# Patient Record
Sex: Female | Born: 1940
Health system: Southern US, Community
[De-identification: ages and names within clinical notes are randomized; demographics above are authoritative.]

## PROBLEM LIST (undated history)

## (undated) DIAGNOSIS — I779 Disorder of arteries and arterioles, unspecified: Secondary | ICD-10-CM

## (undated) DIAGNOSIS — D1803 Hemangioma of intra-abdominal structures: Secondary | ICD-10-CM

## (undated) DIAGNOSIS — D510 Vitamin B12 deficiency anemia due to intrinsic factor deficiency: Secondary | ICD-10-CM

## (undated) DIAGNOSIS — I10 Essential (primary) hypertension: Secondary | ICD-10-CM

## (undated) DIAGNOSIS — K625 Hemorrhage of anus and rectum: Secondary | ICD-10-CM

## (undated) DIAGNOSIS — K5790 Diverticulosis of intestine, part unspecified, without perforation or abscess without bleeding: Secondary | ICD-10-CM

## (undated) DIAGNOSIS — K635 Polyp of colon: Secondary | ICD-10-CM

## (undated) HISTORY — PX: DENTAL SURGERY: SHX609

## (undated) HISTORY — DX: Vitamin B12 deficiency anemia due to intrinsic factor deficiency: D51.0

## (undated) HISTORY — DX: Diverticulosis of intestine, part unspecified, without perforation or abscess without bleeding: K57.90

## (undated) HISTORY — DX: Hemorrhage of anus and rectum: K62.5

## (undated) HISTORY — DX: Polyp of colon: K63.5

## (undated) HISTORY — DX: Hemangioma of intra-abdominal structures: D18.03

## (undated) HISTORY — DX: Essential (primary) hypertension: I10

## (undated) HISTORY — PX: KNEE SURGERY: SHX244

## (undated) HISTORY — PX: HERNIA REPAIR: SHX51

## (undated) HISTORY — PX: APPENDECTOMY: SHX54

---

## 1999-05-29 ENCOUNTER — Encounter: Payer: Self-pay | Admitting: Emergency Medicine

## 1999-05-29 ENCOUNTER — Emergency Department (HOSPITAL_COMMUNITY): Admission: EM | Admit: 1999-05-29 | Discharge: 1999-05-29 | Payer: Self-pay | Admitting: Emergency Medicine

## 2000-07-16 ENCOUNTER — Ambulatory Visit (HOSPITAL_BASED_OUTPATIENT_CLINIC_OR_DEPARTMENT_OTHER): Admission: RE | Admit: 2000-07-16 | Discharge: 2000-07-16 | Payer: Self-pay | Admitting: Orthopaedic Surgery

## 2004-04-05 ENCOUNTER — Encounter: Admission: RE | Admit: 2004-04-05 | Discharge: 2004-04-05 | Payer: Self-pay | Admitting: Orthopedic Surgery

## 2004-06-05 ENCOUNTER — Ambulatory Visit (HOSPITAL_BASED_OUTPATIENT_CLINIC_OR_DEPARTMENT_OTHER): Admission: RE | Admit: 2004-06-05 | Discharge: 2004-06-05 | Payer: Self-pay | Admitting: Orthopedic Surgery

## 2004-07-11 ENCOUNTER — Ambulatory Visit: Payer: Self-pay | Admitting: Internal Medicine

## 2004-07-18 ENCOUNTER — Ambulatory Visit: Payer: Self-pay | Admitting: Internal Medicine

## 2004-07-18 DIAGNOSIS — K635 Polyp of colon: Secondary | ICD-10-CM

## 2004-07-18 HISTORY — DX: Polyp of colon: K63.5

## 2004-08-20 ENCOUNTER — Encounter: Admission: RE | Admit: 2004-08-20 | Discharge: 2004-08-20 | Payer: Self-pay | Admitting: Orthopedic Surgery

## 2005-06-26 ENCOUNTER — Other Ambulatory Visit: Admission: RE | Admit: 2005-06-26 | Discharge: 2005-06-26 | Payer: Self-pay | Admitting: Family Medicine

## 2005-07-09 ENCOUNTER — Encounter: Admission: RE | Admit: 2005-07-09 | Discharge: 2005-10-07 | Payer: Self-pay | Admitting: Family Medicine

## 2006-07-04 ENCOUNTER — Other Ambulatory Visit: Admission: RE | Admit: 2006-07-04 | Discharge: 2006-07-04 | Payer: Self-pay | Admitting: Family Medicine

## 2006-10-28 ENCOUNTER — Encounter (INDEPENDENT_AMBULATORY_CARE_PROVIDER_SITE_OTHER): Payer: Self-pay | Admitting: *Deleted

## 2006-10-28 ENCOUNTER — Ambulatory Visit (HOSPITAL_COMMUNITY): Admission: RE | Admit: 2006-10-28 | Discharge: 2006-10-28 | Payer: Self-pay | Admitting: Obstetrics and Gynecology

## 2006-12-24 ENCOUNTER — Ambulatory Visit (HOSPITAL_COMMUNITY): Admission: RE | Admit: 2006-12-24 | Discharge: 2006-12-24 | Payer: Self-pay | Admitting: Surgery

## 2006-12-24 ENCOUNTER — Encounter (INDEPENDENT_AMBULATORY_CARE_PROVIDER_SITE_OTHER): Payer: Self-pay | Admitting: *Deleted

## 2006-12-24 HISTORY — PX: INGUINAL HERNIA REPAIR: SUR1180

## 2006-12-24 HISTORY — PX: UMBILICAL HERNIA REPAIR: SHX196

## 2008-06-22 DIAGNOSIS — Z860100 Personal history of colon polyps, unspecified: Secondary | ICD-10-CM | POA: Insufficient documentation

## 2008-06-22 DIAGNOSIS — K5730 Diverticulosis of large intestine without perforation or abscess without bleeding: Secondary | ICD-10-CM

## 2008-06-22 DIAGNOSIS — K921 Melena: Secondary | ICD-10-CM | POA: Insufficient documentation

## 2008-06-23 ENCOUNTER — Ambulatory Visit: Payer: Self-pay | Admitting: Internal Medicine

## 2008-06-23 DIAGNOSIS — K8020 Calculus of gallbladder without cholecystitis without obstruction: Secondary | ICD-10-CM | POA: Insufficient documentation

## 2008-06-24 ENCOUNTER — Ambulatory Visit: Payer: Self-pay | Admitting: Internal Medicine

## 2008-06-30 ENCOUNTER — Ambulatory Visit: Payer: Self-pay | Admitting: Internal Medicine

## 2008-06-30 LAB — CONVERTED CEMR LAB
Fecal Occult Blood: NEGATIVE
OCCULT 5: NEGATIVE

## 2008-07-03 ENCOUNTER — Encounter: Payer: Self-pay | Admitting: Internal Medicine

## 2008-09-29 ENCOUNTER — Emergency Department (HOSPITAL_COMMUNITY): Admission: EM | Admit: 2008-09-29 | Discharge: 2008-09-29 | Payer: Self-pay | Admitting: Emergency Medicine

## 2008-12-27 ENCOUNTER — Encounter: Admission: RE | Admit: 2008-12-27 | Discharge: 2008-12-27 | Payer: Self-pay | Admitting: Orthopedic Surgery

## 2009-07-13 ENCOUNTER — Encounter (INDEPENDENT_AMBULATORY_CARE_PROVIDER_SITE_OTHER): Payer: Self-pay | Admitting: *Deleted

## 2010-05-21 HISTORY — PX: APPENDECTOMY: SHX54

## 2010-06-11 ENCOUNTER — Encounter: Payer: Self-pay | Admitting: Surgery

## 2010-06-22 NOTE — Letter (Signed)
Summary: Colonoscopy Date Change Letter  [facility]  [address] [place], [place] [address]   Phone: [phone]  Fax: [phone]      July 13, 2009 MRN: [id number]   [facility] [address] [place], [place]  [address]   Dear Ms. Vantine,   Previously you were recommended to have a repeat colonoscopy around this time. Your chart was recently reviewed by Dr. Hedwig Morton. Juanda Chance of [facility]. Follow up colonoscopy is now recommended in February 2013. This revised recommendation is based on current, nationally recognized guidelines for colorectal cancer screening and polyp surveillance. These guidelines are endorsed by the American Cancer Society, [employer] on Colorectal Cancer as well as numerous other major medical organizations.  Please understand that our recommendation assumes that you do not have any new symptoms such as bleeding, a change in bowel habits, anemia, or significant abdominal discomfort. If you do have any concerning GI symptoms or want to discuss the guideline recommendations, please call to arrange an office visit at your earliest convenience. Otherwise we will keep you in our reminder system and contact you 1-2 months prior to the date listed above to schedule your next colonoscopy.  Thank you,  Hedwig Morton. Juanda Chance, M.D.  [facility]: [facility] [phone]

## 2010-10-03 NOTE — Op Note (Signed)
Tina Hardy, Tina Hardy            ACCOUNT NO.:  [id number]   MEDICAL RECORD NO.:  [id number]          PATIENT TYPE:  AMB   LOCATION:  DAY                          FACILITY:  [facility]   PHYSICIAN:  Ardeth Sportsman, MD     DATE OF BIRTH:  04-09-1941   DATE OF PROCEDURE:  12/24/2006  DATE OF DISCHARGE:                               OPERATIVE REPORT   PRIMARY CARE PHYSICIAN:  Zenaida Niece, M.D.   SURGEON:  Ardeth Sportsman, MD   ASSISTANT:  None.   PREOPERATIVE DIAGNOSES:  1. Left lower quadrant abdominal hernia incarcerated with sigmoid      colon.  2. Umbilical hernia.   POSTOPERATIVE DIAGNOSES:  1. Left inguinal indirect hernia incarcerated with sigmoid colon.  2. Umbilical hernia.  3. Diastases recti, mild & infraumbilical.   PROCEDURE:  1. Diagnostic laparoscopy with reduction of the sigmoid colon of      hernia.  2. Laparoscopic preperitoneal left inguinal hernia repair with mesh      (Parietex 15 x 15 cm).  3. Primary umbilical hernia repair.   ANESTHESIA:  1. General anesthesia.  2. Local anesthetic in with a field block around all port sites as      well as left ilioinguinal and genitofemoral nerve blocks.   SPECIMENS:  None.   DRAINS:  None.   ESTIMATED BLOOD LOSS:  Less than 10 mL.   COMPLICATIONS:  None apparent.   INDICATIONS:  Ms. Rufo is a 70 year old woman who has had a left  lower quadrant abdominal pain and swelling with CT scan concerning of an  abdominal hernia lateral to rectus muscles in the left lower quadrant.  She also a small umbilical hernia.  The anatomy and physiology of  abdominal wall formation and pathophysiology of herniation with risks,  incarceration and strangulation and debilitating pain and function was  discussed.  Options passed and recommendations made for diagnostic  laparoscopy with reduction of hernias and laparoscopic repair of  hernias.   Risks such as stroke, heart attack, deep venous thrombosis, pulmonary  embolism and death were discussed.  Risks such as bleeding, need for  transfusion, wound infection, abscess, injury to other organs, hernia  recurrence, prolonged pain, urinary retention, other diagnoses other  risks were discussed.  Questions answered.  She agreed to proceed.   OPERATIVE FINDINGS:  She actually had indirect left inguinal hernia with  sigmoid colon incarcerated within it.  It was tracking cephalad.  Hernia  sac seemed to be tracking cephalad.  There was no obvious direct defect.  She had a small umbilical defect.  She had mild diastasis recti and in  her infraumbilical midline region.  Did not seem to prominent  supraumbilically.   DESCRIPTION OF PROCEDURE:  Informed consent was confirmed.  The patient  received IV clindamycin and gentamicin given her penicillin allergy.  She was positioned supine, both arms tucked.  She underwent general  anesthesia without difficulty.  She had sequential compression devices  active during entire case.  She has Foley catheter sterilely placed.  Her abdomen was prepped, draped in sterile fashion.   Entry was gained  to the abdomen.  The patient in steep reverse  Trendelenburg and right-side up using optical entry with a 5 mm/0 degree  scope.  Capnoperitoneum 15 mmHg provided good abdominal insufflation.  The direct visualization a 10 mm port was placed in the right flank and  a 5 mm port was placed in the right lower quadrant.  Diagnostic  laparoscopy revealed a very small umbilical hernia.  There was obvious  sigmoid colon was obviously incarcerated up into the defect in the left  lower quadrant.  This sigmoid colon was able to be freed out with  primary blunt as well as some controlled sharp dissection.  No cautery  was used.  Once this was reduced further adequately seen became more  apparent with the round ligament going into this structure that this was  in fact a large indirect inguinal hernia with hernia sac distracting  cephalad  instead of down into the groin.  The sigmoid colon appeared  viable with no other abnormalities.  Given this location, I felt I could  do a better repair through a preperitoneal approach.  Therefore  capnoperitoneum was evacuated.   An infraumbilical curvilinear incision was made and nick was made in the  rectus fascia just the right of midline.  A 10 mm port was passed behind  posterior to the right rectus abdominis muscle, was capnopreperitoneum  was induced 15 mmHg.  The right lower quadrant abdominal camera was used  help free the peritoneum off the anterior abdominal wall.  Enough  working space was created such that the right lower quadrant 5 port  could be directed into the preperitoneal space.  A 5 mm port was placed  in the left upper quadrant directed into the preperitoneal space.   Dissection was done to free the peritoneum off the anterior abdominal  wall laterally.  I could see the peritoneum crawling up into the defect  and the hernia sac was able to be completely reduced.  Defect was just  lateral to the inferior epigastric consistent with an indirect hernia.  The round ligament was skeletonized and transected and reduced.  Further  dissection was done to help free the peritoneum off posteriorly and  proximally off the psoas as possible.  The iliac vessels and ureter  could be seen and were preserved at all times.  No cautery was used  around that region.  The anteromedial bladder was freed off the  anteromedial pelvic wall near level of the obturator foramen.  Hemostasis was excellent.  Camera inspection confirmed a large indirect  defect.  There were no other defects noted.  There was no peritoneal  tears of significance.  There was no peritoneal tears.  A 15 x 15 cm  Parietex mesh was cut to a half - skull shape and placed into the  preperitoneal space such that a medial and inferior flap rested in the  true pelvis overlying the obturator foramen.  Mesh laid well  inferiorly  and proximally as well as laterally superiorly, medially such that was  at least 3 inches circumferential converge around the indirect defect.  Peritoneum was allowed to fold back over the mesh.  Lead points of the  hernia sac were grasped, elevated cephalad as capnopreperitoneum was  evacuated.   5 mm right lower quadrant port was redirected into the peritoneal space  and camera inspection revealed good circumferential coverage from a  intraperitoneal perspective.  I again the mild diastasis recti could be  seen but it was rather  mild and that no evidence of incarceration,  strangulation or any other abnormalities.  The fascial defect in the  right flank 10 mm was approximated using a #1 Novofil stitch using  laparoscopic suture passer under direct visualization.  Camera  inspection revealed except for the small umbilical hernia, no other  obvious abdominal wall defects.  Capnoperitoneum was evacuated and all  the rest of the ports were removed.  The fascial defect infraumbilically  was small enough to be easily closed using a figure-of-eight zero Vicryl  stitch to good result pretty much incorporating around the level of the  fascial defect made for the initial port placement.  Skin was closed  using 4-0 Monocryl stitch.  Sterile dressings applied.  The patient was  extubated and sent to recovery in stable condition.   I explained the operative findings to the patient's family.  Postop  instructions were discussed in detail.  Questions answered.  They  expressed understanding and appreciation.      Ardeth Sportsman, MD  Electronically Signed     SCG/MEDQ  D:  12/24/2006  T:  12/24/2006  Job:  [id number]   cc:   Zenaida Niece, M.D.  Fax: [phone]

## 2010-10-06 NOTE — Op Note (Signed)
[place]  Patient:    Docia Furl                       MRN: [id number] Proc. Date: 07/16/00 Adm. Date:  [id number] Attending:  Marcene Corning                           Operative Report  PREOPERATIVE DIAGNOSIS:  Left knee torn lateral meniscus.  POSTOPERATIVE DIAGNOSES: 1. Left knee torn lateral meniscus. 2. Left knee chondromalacia of the patella.  PROCEDURES: 1. Left partial lateral meniscectomy. 2. Left knee chondroplasty, intertrochlear groove.  ANESTHESIA:  Knee block.  SURGEON:  Lubertha Basque. Jerl Santos, M.D.  INDICATION FOR PROCEDURE:  The patient is a 70 year old woman more than a year out from knee injuries.  She had a nondisplaced tibial plateau fracture and this went on to heal, but unfortunately she has persisted with lateral aspect ankle pain.  This persists despite oral anti-inflammatories and an injection, which did help her in a transient way.  At this point, she is offered operative intervention that consists of an arthroscopy.  The procedure was discussed with the patient, and informed operative consent was obtained after discussing the possible complications of, reaction to anesthesia, and infection.  DESCRIPTION OF PROCEDURE:  The patient was taken to the operating suite, where knee block anesthetic was applied without difficulty.  She was positioned supine and prepped and draped in the normal sterile fashion.  After the initiation of preoperative IV antibiotics, an arthroscopy of the left knee was performed through two inferior portals.  The suprapatellar pouch was benign, while the patellofemoral joint exhibited some grade 2 and grade 3 chondromalacia in the groove.  This was addressed with a thorough chondroplasty.  The patella tracked well, and no lateral release was required. The medial compartment was completely benign, and the ACL was intact in the notch.  In the lateral compartment, she did have a small tear of  the lateral meniscus, which was addressed with a partial lateral meniscectomy, taking a tiny portion of the structure back to the stable rim.  On the far posterior aspect of the tibial plateau, an old injury was seen, which probably represents her old fracture, with some scar tissue having formed to smooth out the tibial plateau in an even fashion.  The knee was thoroughly irrigated at the end of the case, followed by placement of Marcaine with epinephrine and morphine.  Adaptic was placed over her portals, followed by dry gauze and a loose Ace wrap.  Estimated blood loss and intraoperative fluids can be obtained from the anesthesia records.  DISPOSITION:  The patient was taken to recovery in stable condition.  Plans were for her to go home the same day and to follow up in the office in less than a week.  I will contact her by phone tonight. DD:  07/16/00 TD:  07/16/00 Job: [id number]

## 2010-10-06 NOTE — Op Note (Signed)
NAMEWINNI, EHRHARD            ACCOUNT NO.:  [id number]   MEDICAL RECORD NO.:  [id number]          PATIENT TYPE:  AMB   LOCATION:  [facility]C                         FACILITY:  [facility]   PHYSICIAN:  Marlowe Kays, M.D.  DATE OF BIRTH:  01/18/1941   DATE OF PROCEDURE:  06/05/2004  DATE OF DISCHARGE:                                 OPERATIVE REPORT   PREOPERATIVE DIAGNOSIS:  Complex tear of lateral meniscus, right knee.   POSTOPERATIVE DIAGNOSIS:  Complex tear of lateral meniscus, right knee.   OPERATION:  Right knee arthroscopy with partial lateral meniscectomy.   SURGEON:  Marlowe Kays, M.D.   ASSISTANT:  Nurse.   ANESTHESIA:  General.   PATHOLOGY AND JUSTIFICATION FOR PROCEDURE:  Pain and swelling, right knee,  with MRI demonstrating complex tear of the anterior and midbody.  This is  confirmed at surgery.  Other than some minimal arthritic changes, the rest  of the knee looked unremarkable.   PROCEDURE:  Satisfactory general anesthesia.  Pneumatic tourniquet, thigh  stabilizer.  The knee was esmarched out nonsterilely and prepped from  stabilizer to ankle with DuraPrep, draped in a sterile field.  The left leg  was wrapped with Ace wrap and knee support beneath it.  Superior medial  saline inflow.  First through an anterolateral portal, the medial  compartment of the knee joint was evaluated.  As noted, the medial  compartment of the knee was unremarkable.  Then looked up in the medial  gutter and suprapatellar area with no abnormalities noted.  I reversed  portals.  On first glance, there was some fraying of the inner aspect of the  lateral meniscus, which seemed at odds with the MRI.  However, on further  inspection there was a good bit of reactive tissue in the area of the mid-  body of the meniscus and the synovial surface and after cleaning this up  with the 3.5 shaver and probing, I found a significant  tear.  This was  almost a bucket handle-type tear of this area.   I handled this by using  arthroscopic scissors to cut it anteriorly and then working posteriorly and  smoothing off the large bucket handle fragment at the posterior horn.  Remnants of this junction were then trimmed up with small baskets.  Final  meniscus was basically nonexistent until the posterior third, which was  stable.  The knee joint was then irrigated until clear and all fluid  possible removed.  Marcaine 0.5% with adrenalin 20 mL was then instilled  through the inflow apparatus, which was removed from this portal and closed  with 4-0 nylon as well.  Betadine and Adaptic dry sterile dressing were  applied, the tourniquet was released.  At the time of this dictation, she  was on her way to the recovery room in satisfactory condition with no known  complications.     JA/MEDQ  D:  06/05/2004  T:  06/05/2004  Job:  [id number]

## 2010-11-14 ENCOUNTER — Other Ambulatory Visit (INDEPENDENT_AMBULATORY_CARE_PROVIDER_SITE_OTHER): Payer: Self-pay | Admitting: General Surgery

## 2010-11-14 ENCOUNTER — Emergency Department (HOSPITAL_COMMUNITY): Payer: Medicare Other

## 2010-11-14 ENCOUNTER — Inpatient Hospital Stay (HOSPITAL_COMMUNITY)
Admission: EM | Admit: 2010-11-14 | Discharge: 2010-11-15 | DRG: 343 | Disposition: A | Discharge status: Home / Self Care | Payer: Medicare Other | Attending: General Surgery | Admitting: General Surgery

## 2010-11-14 DIAGNOSIS — K3580 Unspecified acute appendicitis: Secondary | ICD-10-CM

## 2010-11-14 DIAGNOSIS — R1031 Right lower quadrant pain: Secondary | ICD-10-CM

## 2010-11-14 LAB — DIFFERENTIAL
Basophils Absolute: 0 10*3/uL (ref 0.0–0.1)
Lymphocytes Relative: 17 % (ref 12–46)
Lymphs Abs: 2.4 10*3/uL (ref 0.7–4.0)
Neutro Abs: 10.4 10*3/uL — ABNORMAL HIGH (ref 1.7–7.7)
Neutrophils Relative %: 74 % (ref 43–77)

## 2010-11-14 LAB — BASIC METABOLIC PANEL
Calcium: 9.4 mg/dL (ref 8.4–10.5)
GFR calc non Af Amer: >60 mL/min (ref >60)
Glucose, Bld: 78 mg/dL (ref 70–99)
Sodium: 136 mEq/L (ref 135–145)

## 2010-11-14 LAB — CBC
MCH: 30.4 pg (ref 26.0–34.0)
MCHC: 32.4 g/dL (ref 30.0–36.0)
Platelets: 352 10*3/uL (ref 150–400)
RBC: 4.48 MIL/uL (ref 3.87–5.11)
WBC: 14.1 10*3/uL — ABNORMAL HIGH (ref 4.0–10.5)

## 2010-11-20 NOTE — Op Note (Signed)
Tina Hardy, Tina Hardy            ACCOUNT NO.:  [id number]  MEDICAL RECORD NO.:  [id number]  LOCATION:  0001                         FACILITY:  [facility]  PHYSICIAN:  Adolph Pollack, M.D.DATE OF BIRTH:  03-03-1941  DATE OF PROCEDURE:  11/14/2010 DATE OF DISCHARGE:                              OPERATIVE REPORT   PREOPERATIVE DIAGNOSIS:  Acute appendicitis.  POSTOPERATIVE DIAGNOSIS:  Acute retrocecal appendicitis.  PROCEDURE:  Laparoscopic appendectomy.  SURGEON:  Adolph Pollack, MD  ANESTHESIA:  General.  INDICATION:  This is a 70 year old female who began feeling weak and having abdominal pain throughout the night.  She presented to Dr. Lanell Matar office and was sent for a CT scan which was consistent with acute appendicitis.  She now presents for laparoscopic appendectomy.  We discussed the procedure risks and aftercare preoperatively.  TECHNIQUE:  She was brought to the operating room, placed supine on the operating table and general anesthetic was administered.  The hair in the lower abdominal area was clipped and a Foley was inserted.  The abdominal wall was sterilely prepped and draped.  Just above the umbilicus, Marcaine solution was infiltrated and a small longitudinal incision was made through the skin, subcutaneous tissue, fascia, and peritoneum entering the peritoneal cavity under direct vision.  A pursestring suture of 0 Vicryl was placed around the fascial edges.  A Hasson trocar was introduced into the peritoneal cavity and pneumoperitoneum created by insufflation of CO2 gas.  Next, a laparoscope was introduced and there was no underlying bleeding or organ injury.  There was no purulent drainage or evidence of perforation.  A 5 mm trocar was placed in left lower quadrant.  The cecum was rotated medially and an acutely inflamed retrocecal appendicitis was noted.  A 5 mm trocar was then placed in the right upper quadrant.  I then dissected the retrocecal  appendix free from its attachments to the antimesenteric fat pad out of the ileum as well as some of the mesocolon.  I then divided the mesoappendix down to its base using the Harmonic scalpel.  The appendix was then retracted anteriorly. Using the Endo-GIA stapler, I amputated the appendix off the cecum with a small cuff of cecum.  This was placed in Endopouch bag and removed through the supraumbilical port.  Trocar was then replaced.  I then copiously irrigated out the staple line area in the right lower quadrant.  There was some bleeding from the lateral staple line which was controlled with hemoclips.  I then evacuated irrigation fluid as much as possible and re-inspected the mesenteric dissection as well as right lower quadrant, there was no evidence of bleeding and the staple line appeared solid.  I then removed the supraumbilical trocar and closed the fascial defect under laparoscopic vision by tightening up and tying down the pursestring suture.  The remaining trocars were removed and pneumoperitoneum was released.  The skin incisions were closed with 4-0 Monocryl subcuticular stitches.  Steri-Strips and sterile dressings were applied.  She tolerated procedure without any apparent complications and was taken to recovery room in satisfactory condition.     Adolph Pollack, M.D.     Kari Baars  D:  11/14/2010  T:  11/15/2010  Job:  [id number]  cc:   Geoffry Paradise, M.D. Fax: [phone]  Electronically Signed by Avel Peace M.D. on 11/20/2010 08:31:04 AM

## 2010-12-05 ENCOUNTER — Ambulatory Visit (INDEPENDENT_AMBULATORY_CARE_PROVIDER_SITE_OTHER): Payer: Medicare Other | Admitting: General Surgery

## 2010-12-05 ENCOUNTER — Encounter (INDEPENDENT_AMBULATORY_CARE_PROVIDER_SITE_OTHER): Payer: Self-pay | Admitting: General Surgery

## 2010-12-05 DIAGNOSIS — K3580 Unspecified acute appendicitis: Secondary | ICD-10-CM

## 2010-12-05 NOTE — Progress Notes (Signed)
History of Present Illness: Tina Hardy is a  70 y.o. female who presents today status post lap appy.  Pathology reveals acute suppurative appendicitis.  The patient is tolerating a regular diet, having normal bowel movements, has good pain control.  She  is back to most normal activities. She thinks she may have a kidney infection.  She had some discomfort while taking antibiotics, which resolved and again after drinking tea last Sunday.  Pt. Reports no prior hx. Of UTI's.  Her Primary care is Dr. Tawni Levy so we will defer to him, on possible UTI.   Physical Exam: Abd: soft, nontender, active bowel sounds, nondistended.  All incisions are well healed.  Impression: 1.  Acute appendicitis, s/p lap appy  Plan: She  is able to return to normal activities. She  may follow up on a prn basis.

## 2010-12-05 NOTE — Patient Instructions (Signed)
Call Dr. Jacky Kindle to follow up on possible urinary or kidney infection.

## 2011-03-05 LAB — HEMOGLOBIN AND HEMATOCRIT, BLOOD: HCT: 38.8

## 2011-03-08 LAB — CREATININE, SERUM: GFR calc non Af Amer: >60

## 2011-07-13 ENCOUNTER — Encounter: Payer: Self-pay | Admitting: Internal Medicine

## 2012-03-05 ENCOUNTER — Other Ambulatory Visit: Payer: Self-pay | Admitting: Chiropractor

## 2012-03-05 ENCOUNTER — Ambulatory Visit
Admission: RE | Admit: 2012-03-05 | Discharge: 2012-03-05 | Disposition: A | Discharge status: Home / Self Care | Payer: Medicare Other | Source: Ambulatory Visit | Attending: Chiropractor | Admitting: Chiropractor

## 2012-03-05 DIAGNOSIS — M545 Low back pain: Secondary | ICD-10-CM

## 2012-04-15 ENCOUNTER — Telehealth: Payer: Self-pay | Admitting: Internal Medicine

## 2012-04-15 ENCOUNTER — Encounter: Payer: Self-pay | Admitting: *Deleted

## 2012-04-15 NOTE — Telephone Encounter (Signed)
Scheduled with Dr. Juanda Chance on 04/16/12 at 11:30 AM. Malachi Bonds to fax records.

## 2012-04-16 ENCOUNTER — Ambulatory Visit (INDEPENDENT_AMBULATORY_CARE_PROVIDER_SITE_OTHER): Payer: Medicare Other | Admitting: Internal Medicine

## 2012-04-16 ENCOUNTER — Encounter: Payer: Self-pay | Admitting: Internal Medicine

## 2012-04-16 ENCOUNTER — Other Ambulatory Visit (INDEPENDENT_AMBULATORY_CARE_PROVIDER_SITE_OTHER): Payer: Medicare Other

## 2012-04-16 VITALS — BP 122/80 | HR 88 | Ht 63.75 in | Wt 155.5 lb

## 2012-04-16 DIAGNOSIS — K8020 Calculus of gallbladder without cholecystitis without obstruction: Secondary | ICD-10-CM

## 2012-04-16 DIAGNOSIS — R1033 Periumbilical pain: Secondary | ICD-10-CM

## 2012-04-16 LAB — HEPATIC FUNCTION PANEL
AST: 24 U/L (ref 0–37)
Albumin: 4.3 g/dL (ref 3.5–5.2)
Alkaline Phosphatase: 82 U/L (ref 39–117)

## 2012-04-16 NOTE — Progress Notes (Signed)
Tina Hardy [birth year] MRN [id number]   History of Present Illness:  This is a 71 year old white female with periumbilical abdominal pain occurring almost daily, even at night. It is associated with meals. She denies any irregularity of bowel habits. Her last colonoscopy in 2010 did not show any diverticulosis. There is a strong family history of gallbladder disease in her sisters. In 2008, a CT scan of the abdomen showed gallbladder stones without evidence of cholecystitis. She has been working out several times a week to maintain her weight. She denies dysphagia, odynophagia or rectal bleeding.   Past Medical History  Diagnosis Date  . Liver hemangioma   . Gallstones   . Diverticulosis   . Colon polyp   . Pernicious anemia    Past Surgical History  Procedure Date  . Appendectomy   . Umbilical hernia repair     with reduction of sigmoid colon which was incarcerated  . Knee surgery     right  . Inguinal hernia repair     reports that she quit smoking about 39 years ago. She has never used smokeless tobacco. She reports that she drinks alcohol. She reports that she uses illicit drugs (Marijuana). family history includes Colon polyps in an unspecified family member and Crohn's disease in her others. Allergies  Allergen Reactions  . Codeine     REACTION: questionable  . Penicillins     REACTION: questionable        Review of Systems:Negative for heartburn dysphagia rectal bleeding  The remainder of the 10 point ROS is negative except as outlined in H&P   Physical Exam: General appearance  Well developed, in no distress. Eyes- non icteric. HEENT nontraumatic, normocephalic. Mouth no lesions, tongue papillated, no cheilosis. Neck supple without adenopathy, thyroid not enlarged, no carotid bruits, no JVD. Lungs Clear to auscultation bilaterally. Cor normal S1, normal S2, regular rhythm, no murmur,  quiet precordium. Abdomen: Soft with mild tenderness along right  costal margin. Liver edge at costal margin. No CVA tenderness. Lower abdomen unremarkable.  Rectal:Not done.  Extremities no pedal edema. Skin no lesions. Neurological alert and oriented x 3. Psychological normal mood and affect.  Assessment and Plan:  Problem #1 New onset of vague abdominal pain suggestive of biliary origin or pancreatitis.Marland Kitchen Physical exam confirms tenderness in the right upper quadrant. She has a strong family history of gallbladder disease and has known gallstones on prior imaging in 2008. She is up-to-date on her colonoscopy. We will proceed with an upper abdominal ultrasound and HIDA scan if necessary. We will check her amylase, lipase and liver function tests today. I instructed her a low-fat diet especially before the holidays to avoid exacerbation of gallbladder disease.   04/16/2012 [name]

## 2012-04-16 NOTE — Patient Instructions (Addendum)
Your physician has requested that you go to the basement for the following lab work before leaving today: Hepatic Panel, Amylase, Lipase  You have been scheduled for an abdominal ultrasound at [facility] (1st floor of hospital) on Tuesday, 04/22/12 at 8:30 am. Please arrive 15 minutes prior to your appointment for registration. Make certain not to have anything to eat or drink 6 hours prior to your appointment. Should you need to reschedule your appointment, please contact radiology at [phone]. This test typically takes about 30 minutes to perform.  CC: Dr Geoffry Paradise

## 2012-04-18 ENCOUNTER — Encounter: Payer: Self-pay | Admitting: Internal Medicine

## 2012-04-22 ENCOUNTER — Other Ambulatory Visit: Payer: Self-pay | Admitting: *Deleted

## 2012-04-22 ENCOUNTER — Ambulatory Visit (HOSPITAL_COMMUNITY)
Admission: RE | Admit: 2012-04-22 | Discharge: 2012-04-22 | Disposition: A | Discharge status: Home / Self Care | Payer: Medicare Other | Source: Ambulatory Visit | Attending: Internal Medicine | Admitting: Internal Medicine

## 2012-04-22 DIAGNOSIS — D1803 Hemangioma of intra-abdominal structures: Secondary | ICD-10-CM | POA: Insufficient documentation

## 2012-04-22 DIAGNOSIS — K8020 Calculus of gallbladder without cholecystitis without obstruction: Secondary | ICD-10-CM

## 2012-04-22 DIAGNOSIS — K7689 Other specified diseases of liver: Secondary | ICD-10-CM | POA: Insufficient documentation

## 2012-04-23 ENCOUNTER — Other Ambulatory Visit (INDEPENDENT_AMBULATORY_CARE_PROVIDER_SITE_OTHER): Payer: Medicare Other

## 2012-04-23 DIAGNOSIS — K8020 Calculus of gallbladder without cholecystitis without obstruction: Secondary | ICD-10-CM

## 2012-04-23 LAB — LIPASE: Lipase: 48 U/L (ref 11.0–59.0)

## 2012-05-01 ENCOUNTER — Encounter (INDEPENDENT_AMBULATORY_CARE_PROVIDER_SITE_OTHER): Payer: Self-pay | Admitting: General Surgery

## 2012-05-01 ENCOUNTER — Ambulatory Visit (INDEPENDENT_AMBULATORY_CARE_PROVIDER_SITE_OTHER): Payer: Medicare Other | Admitting: General Surgery

## 2012-05-01 VITALS — BP 132/78 | HR 74 | Temp 97.8°F | Resp 16 | Ht 65.0 in | Wt 155.5 lb

## 2012-05-01 DIAGNOSIS — K8020 Calculus of gallbladder without cholecystitis without obstruction: Secondary | ICD-10-CM | POA: Insufficient documentation

## 2012-05-01 NOTE — Patient Instructions (Signed)
Laparoscopic Cholecystectomy Laparoscopic cholecystectomy is surgery to remove the gallbladder. The gallbladder is located slightly to the right of center in the abdomen, behind the liver. It is a concentrating and storage sac for the bile produced in the liver. Bile aids in the digestion and absorption of fats. Gallbladder disease (cholecystitis) is an inflammation of your gallbladder. This condition is usually caused by a buildup of gallstones (cholelithiasis) in your gallbladder. Gallstones can block the flow of bile, resulting in inflammation and pain. In severe cases, emergency surgery may be required. When emergency surgery is not required, you will have time to prepare for the procedure. Laparoscopic surgery is an alternative to open surgery. Laparoscopic surgery usually has a shorter recovery time. Your common bile duct may also need to be examined and explored. Your caregiver will discuss this with you if he or she feels this should be done. If stones are found in the common bile duct, they may be removed. LET YOUR CAREGIVER KNOW ABOUT:  Allergies to food or medicine.  Medicines taken, including vitamins, herbs, eyedrops, over-the-counter medicines, and creams.  Use of steroids (by mouth or creams).  Previous problems with anesthetics or numbing medicines.  History of bleeding problems or blood clots.  Previous surgery.  Other health problems, including diabetes and kidney problems.  Possibility of pregnancy, if this applies. RISKS AND COMPLICATIONS All surgery is associated with risks. Some problems that may occur following this procedure include:  Infection.  Damage to the common bile duct, nerves, arteries, veins, or other internal organs such as the stomach or intestines.  Bleeding.  A stone may remain in the common bile duct. BEFORE THE PROCEDURE  Do not take aspirin for 3 days prior to surgery or blood thinners for 1 week prior to surgery.  Do not eat or drink  anything after midnight the night before surgery.  Let your caregiver know if you develop a cold or other infectious problem prior to surgery.  You should be present 60 minutes before the procedure or as directed. PROCEDURE  You will be given medicine that makes you sleep (general anesthetic). When you are asleep, your surgeon will make several small cuts (incisions) in your abdomen. One of these incisions is used to insert a small, lighted scope (laparoscope) into the abdomen. The laparoscope helps the surgeon see into your abdomen. Carbon dioxide gas will be pumped into your abdomen. The gas allows more room for the surgeon to perform your surgery. Other operating instruments are inserted through the other incisions. Laparoscopic procedures may not be appropriate when:  There is major scarring from previous surgery.  The gallbladder is extremely inflamed.  There are bleeding disorders or unexpected cirrhosis of the liver.  A pregnancy is near term.  Other conditions make the laparoscopic procedure impossible. If your surgeon feels it is not safe to continue with a laparoscopic procedure, he or she will perform an open abdominal procedure. In this case, the surgeon will make an incision to open the abdomen. This gives the surgeon a larger view and field to work within. This may allow the surgeon to perform procedures that sometimes cannot be performed with a laparoscope alone. Open surgery has a longer recovery time. AFTER THE PROCEDURE  You will be taken to the recovery area where a nurse will watch and check your progress.  You may be allowed to go home the same day.  Do not resume physical activities until directed by your caregiver.  You may resume a normal diet and   activities as directed. Document Released: 05/07/2005 Document Revised: 07/30/2011 Document Reviewed: 10/20/2010 [employer] Patient Information 2013 [employer]. Fat and Cholesterol Control Diet Cholesterol levels in  your body are determined significantly by your diet. Cholesterol levels may also be related to heart disease. The following material helps to explain this relationship and discusses what you can do to help keep your heart healthy. Not all cholesterol is bad. Low-density lipoprotein (LDL) cholesterol is the "bad" cholesterol. It may cause fatty deposits to build up inside your arteries. High-density lipoprotein (HDL) cholesterol is "good." It helps to remove the "bad" LDL cholesterol from your blood. Cholesterol is a very important risk factor for heart disease. Other risk factors are high blood pressure, smoking, stress, heredity, and weight. The heart muscle gets its supply of blood through the coronary arteries. If your LDL cholesterol is high and your HDL cholesterol is low, you are at risk for having fatty deposits build up in your coronary arteries. This leaves less room through which blood can flow. Without sufficient blood and oxygen, the heart muscle cannot function properly and you may feel chest pains (angina pectoris). When a coronary artery closes up entirely, a part of the heart muscle may die causing a heart attack (myocardial infarction). CHECKING CHOLESTEROL When your caregiver sends your blood to a lab to be examined for cholesterol, a complete lipid (fat) profile may be done. With this test, the total amount of cholesterol and levels of LDL and HDL are determined. Triglycerides are a type of fat that circulates in the blood. They can also be used to determine heart disease risk. The list below describes what the numbers should be: Test: Total Cholesterol.  Less than 200 mg/dl. Test: LDL "bad cholesterol."  Less than 100 mg/dl.  Less than 70 mg/dl if you are at very high risk of a heart attack or sudden cardiac death. Test: HDL "good cholesterol."  Greater than 50 mg/dl for women.  Greater than 40 mg/dl for men. Test: Triglycerides.  Less than 150 mg/dl. CONTROLLING CHOLESTEROL  WITH DIET Although exercise and lifestyle factors are important, your diet is key. That is because certain foods are known to raise cholesterol and others to lower it. The goal is to balance foods for their effect on cholesterol and more importantly, to replace saturated and trans fat with other types of fat, such as monounsaturated fat, polyunsaturated fat, and omega-3 fatty acids. On average, a person should consume no more than 15 to 17 g of saturated fat daily. Saturated and trans fats are considered "bad" fats, and they will raise LDL cholesterol. Saturated fats are primarily found in animal products such as meats, butter, and cream. However, that does not mean you need to give up all your favorite foods. Today, there are good tasting, low-fat, low-cholesterol substitutes for most of the things you like to eat. Choose low-fat or nonfat alternatives. Choose round or loin cuts of red meat. These types of cuts are lowest in fat and cholesterol. Chicken (without the skin), fish, veal, and ground turkey breast are great choices. Eliminate fatty meats, such as hot dogs and salami. Even shellfish have little or no saturated fat. Have a 3 oz (85 g) portion when you eat lean meat, poultry, or fish. Trans fats are also called "partially hydrogenated oils." They are oils that have been scientifically manipulated so that they are solid at room temperature resulting in a longer shelf life and improved taste and texture of foods in which they are added. Trans fats   are found in stick margarine, some tub margarines, cookies, crackers, and baked goods.  When baking and cooking, oils are a great substitute for butter. The monounsaturated oils are especially beneficial since it is believed they lower LDL and raise HDL. The oils you should avoid entirely are saturated tropical oils, such as coconut and palm.  Remember to eat a lot from food groups that are naturally free of saturated and trans fat, including fish, fruit,  vegetables, beans, grains (barley, rice, couscous, bulgur wheat), and pasta (without cream sauces).  IDENTIFYING FOODS THAT LOWER CHOLESTEROL  Soluble fiber may lower your cholesterol. This type of fiber is found in fruits such as apples, vegetables such as broccoli, potatoes, and carrots, legumes such as beans, peas, and lentils, and grains such as barley. Foods fortified with plant sterols (phytosterol) may also lower cholesterol. You should eat at least 2 g per day of these foods for a cholesterol lowering effect.  Read package labels to identify low-saturated fats, trans fat free, and low-fat foods at the supermarket. Select cheeses that have only 2 to 3 g saturated fat per ounce. Use a heart-healthy tub margarine that is free of trans fats or partially hydrogenated oil. When buying baked goods (cookies, crackers), avoid partially hydrogenated oils. Breads and muffins should be made from whole grains (whole-wheat or whole oat flour, instead of "flour" or "enriched flour"). Buy non-creamy canned soups with reduced salt and no added fats.  FOOD PREPARATION TECHNIQUES  Never deep-fry. If you must fry, either stir-fry, which uses very little fat, or use non-stick cooking sprays. When possible, broil, bake, or roast meats, and steam vegetables. Instead of putting butter or margarine on vegetables, use lemon and herbs, applesauce, and cinnamon (for squash and sweet potatoes), nonfat yogurt, salsa, and low-fat dressings for salads.  LOW-SATURATED FAT / LOW-FAT FOOD SUBSTITUTES Meats / Saturated Fat (g)  Avoid: Steak, marbled (3 oz/85 g) / 11 g  Choose: Steak, lean (3 oz/85 g) / 4 g  Avoid: Hamburger (3 oz/85 g) / 7 g  Choose: Hamburger, lean (3 oz/85 g) / 5 g  Avoid: Ham (3 oz/85 g) / 6 g  Choose: Ham, lean cut (3 oz/85 g) / 2.4 g  Avoid: Chicken, with skin, dark meat (3 oz/85 g) / 4 g  Choose: Chicken, skin removed, dark meat (3 oz/85 g) / 2 g  Avoid: Chicken, with skin, light meat (3 oz/85 g)  / 2.5 g  Choose: Chicken, skin removed, light meat (3 oz/85 g) / 1 g Dairy / Saturated Fat (g)  Avoid: Whole milk (1 cup) / 5 g  Choose: Low-fat milk, 2% (1 cup) / 3 g  Choose: Low-fat milk, 1% (1 cup) / 1.5 g  Choose: Skim milk (1 cup) / 0.3 g  Avoid: Hard cheese (1 oz/28 g) / 6 g  Choose: Skim milk cheese (1 oz/28 g) / 2 to 3 g  Avoid: Cottage cheese, 4% fat (1 cup) / 6.5 g  Choose: Low-fat cottage cheese, 1% fat (1 cup) / 1.5 g  Avoid: Ice cream (1 cup) / 9 g  Choose: Sherbet (1 cup) / 2.5 g  Choose: Nonfat frozen yogurt (1 cup) / 0.3 g  Choose: Frozen fruit bar / trace  Avoid: Whipped cream (1 tbs) / 3.5 g  Choose: Nondairy whipped topping (1 tbs) / 1 g Condiments / Saturated Fat (g)  Avoid: Mayonnaise (1 tbs) / 2 g  Choose: Low-fat mayonnaise (1 tbs) / 1 g  Avoid: Butter (1   tbs) / 7 g  Choose: Extra light margarine (1 tbs) / 1 g  Avoid: Coconut oil (1 tbs) / 11.8 g  Choose: Olive oil (1 tbs) / 1.8 g  Choose: Corn oil (1 tbs) / 1.7 g  Choose: Safflower oil (1 tbs) / 1.2 g  Choose: Sunflower oil (1 tbs) / 1.4 g  Choose: Soybean oil (1 tbs) / 2.4 g  Choose: Canola oil (1 tbs) / 1 g Document Released: 05/07/2005 Document Revised: 07/30/2011 Document Reviewed: 10/26/2010 [employer] Patient Information 2013 [employer].  

## 2012-05-01 NOTE — Progress Notes (Signed)
Patient ID: Tina Hardy, female   DOB: 05/05/1941, 71 y.o.   MRN: [id number]  Chief Complaint  Patient presents with  . Abdominal Pain    HPI Tina Hardy is a 71 y.o. female.   HPI 71 yo WF referred by Dr Brodie for evaluation of gallstones. She was seen several weeks ago in Dr. Brodie's office for abdominal pain and back pain. She had lab work performed which demonstrated a mild pancreatitis. Her amylase and lipase were just mildly elevated. She underwent abdominal ultrasound which showed a large gallstone of over 2 cm. She was referred here for evaluation. The patient states that she has pressure and discomfort in her lower abdomen. It is constant. She rates it a 6/10. She states that it is more noticeable with activity. It can radiate to her back. She states that it feels like something has dropped into her lower abdomen. She denies fever, chills, nausea, weight loss, jaundice, acholic stools. She denies diarrhea or constipation. She denies stool caliber changes. She did vomit once after lunch earlier in the week. She also complains of some bloating. She has had some mild intermittent discomfort of her right side as well. She denies any difficulty swallowing liquids or solids. She denies any NSAID use. She does drink wine several times a week. She has not had any medication changes in the past month and a half. She has had a prior laparoscopic repair of a left indirect inguinal hernia with mesh along with an open primary umbilical hernia repair in 2008 by Dr. Gross. She underwent laparoscopic appendectomy by Dr. Rosenbower in 2012. She denies any dysuria. She denies any incontinence. She denies any vaginal bleeding. Past Medical History  Diagnosis Date  . Liver hemangioma   . Gallstones   . Diverticulosis   . Colon polyp   . Pernicious anemia     Past Surgical History  Procedure Date  . Appendectomy 2012  . Umbilical hernia repair 12/24/06    with reduction of sigmoid colon which was  incarcerated  . Knee surgery     right  . Inguinal hernia repair 12/24/06    left; laparoscopic    Family History  Problem Relation Age of Onset  . Colon polyps    . Crohn's disease Other     neice  . Crohn's disease Other     nephew    Social History History  Substance Use Topics  . Smoking status: Former Smoker    Quit date: 12/04/1972  . Smokeless tobacco: Never Used  . Alcohol Use: Yes     Comment: 1 per day    Allergies  Allergen Reactions  . Codeine     REACTION: questionable  . Penicillins     REACTION: questionable    Current Outpatient Prescriptions  Medication Sig Dispense Refill  . Ascorbic Acid (VITAMIN C PO) Take by mouth daily.      . fish oil-omega-3 fatty acids 1000 MG capsule Take 2 g by mouth daily.        . Misc Natural Products (TART CHERRY ADVANCED PO) Take by mouth daily.      . Multiple Vitamin (MULTIVITAMIN) tablet Take 1 tablet by mouth daily.      . Vitamins-Lipotropics (VIT BALANCED B-100 PO) Take by mouth.          Review of Systems Review of Systems  Constitutional: Negative for fever, chills, activity change, appetite change and unexpected weight change.       Walks several times a week    HENT: Negative for hearing loss and neck stiffness.   Eyes: Negative for photophobia and visual disturbance.  Respiratory: Negative for chest tightness and shortness of breath.   Cardiovascular: Negative for chest pain, palpitations and leg swelling.  Gastrointestinal:       See hpi  Genitourinary: Positive for pelvic pain. Negative for dysuria, hematuria, vaginal bleeding, vaginal discharge, difficulty urinating and menstrual problem.  Musculoskeletal: Negative for arthralgias and gait problem.  Neurological: Negative for tremors, syncope, speech difficulty, light-headedness, numbness and headaches.  Hematological: Negative for adenopathy. Does not bruise/bleed easily.  Psychiatric/Behavioral: Negative for confusion.    Blood pressure 132/78,  pulse 74, temperature 97.8 F (36.6 C), temperature source Temporal, resp. rate 16, height 5' 5" (1.651 Hardy), weight 155 lb 8 oz (70.534 kg).  Physical Exam Physical Exam  Vitals reviewed. Constitutional: She is oriented to person, place, and time. She appears well-developed and well-nourished. No distress.  HENT:  Head: Normocephalic and atraumatic.  Right Ear: External ear normal.  Left Ear: External ear normal.  Eyes: Conjunctivae normal are normal. No scleral icterus.  Neck: Normal range of motion. Neck supple. No tracheal deviation present. No thyromegaly present.  Cardiovascular: Normal rate, regular rhythm and normal heart sounds.   Pulmonary/Chest: Effort normal and breath sounds normal. No respiratory distress. She has no wheezes.  Abdominal: Soft. Bowel sounds are normal. She exhibits no distension. There is no tenderness. There is no rebound and no guarding. Hernia confirmed negative in the right inguinal area and confirmed negative in the left inguinal area.    Musculoskeletal: She exhibits no edema and no tenderness.  Neurological: She is alert and oriented to person, place, and time. She exhibits normal muscle tone.  Skin: Skin is warm and dry. She is not diaphoretic.  Psychiatric: She has a normal mood and affect. Her behavior is normal. Judgment and thought content normal.    Data Reviewed Dr Brodie's note Labs - nml lfts; mildly elevated amylase, lipase but normalized  COMPLETE ABDOMINAL ULTRASOUND  Comparison: CT of the abdomen and pelvis 10/28/2006  Findings:  Gallbladder: Within the gallbladder, a large, calcified gallstone  measures 2.2 cm in diameter. Gallstone is within the mid aspect of  the gallbladder. Note is made of adenomyomatosis within the  gallbladder wall. Gallbladder wall is otherwise normal in  thickness, measuring 1.5 mm. No sonographic Murphy's sign or  pericholecystic fluid identified.  Common bile duct: 4.2 mm  Liver: Liver echotexture is  homogeneous. There are several  discrete cysts. Within the liver. In the right hepatic lobe, a  cyst is 1.3 x 1.4 x 1.4 cm. Within the lower aspect of the right  hepatic lobe, a cyst is 1.4 x 1.0 x 1.7 cm. Within the right  hepatic lobe, a mixed echogenicity liver lesion is 3.4 x 4.4 x 3.9  cm. On previous CT exam, characteristics are consistent with  benign hemangioma.  IVC: Appears normal.  Pancreas: No focal abnormality seen.  Spleen: The spleen has a normal appearance and measures 5.9 cm in  length.  Right Kidney: Normal appearance, 10.6 cm in length.  Left Kidney: Normal appearance, 9.3 cm in length.  Abdominal aorta: Not aneurysmal. Bifurcation is not evaluated  because of overlying bowel gas.   IMPRESSION:  1. Single large gallstone, 2.2 cm.  2. Adenomyomatosis within the gallbladder wall. No evidence for  acute cholecystitis.  3. Liver cysts and liver hemangioma.  4. No evidence for hydronephrosis or renal mass.   Assessment    Cholelithiasis Mild   pancreatitis    Plan    It seems her symptoms have changed since she saw Dr. Brodie a few weeks ago. She states the majority of her pain is in her lower abdomen.she states that she has had some mild nonspecific discomfort in her upper abdomen and on the right side. I'Hardy not sure if her gallbladder is the etiology of this lower abdominal discomfort. However based on the ultrasound findings of a 2 cm gallstone as well as adenomyomatosis within the gallbladder wall I have recommended a cholecystectomy. I explained to her and her husband that a cholecystectomy may not ameliorate all of her abdominal complaints. I explained that if she continues to have lower abdominal and pelvic discomfort after cholecystectomy that she may need additional workup. Her amylase and lipase were just mildly elevated on one occurrence so I am not terribly impressed with her pancreatitis.  We discussed gallbladder disease. The patient was given educational  material. We discussed non-operative and operative management. We discussed the signs & symptoms of acute cholecystitis  I discussed laparoscopic cholecystectomy with IOC in detail.  The patient was given educational material as well as diagrams detailing the procedure.  We discussed the risks and benefits of a laparoscopic cholecystectomy including, but not limited to bleeding, infection, injury to surrounding structures such as the intestine or liver, bile leak, retained gallstones, need to convert to an open procedure, prolonged diarrhea, blood clots such as  DVT, common bile duct injury, anesthesia risks, and possible need for additional procedures.  We discussed the typical post-operative recovery course. I explained that the likelihood of improvement of their symptoms is fair.  Tina Landgren Hardy. Jazzlene Huot, MD, FACS General, Bariatric, & Minimally Invasive Surgery [facility]        Tina Hardy 05/01/2012, 9:52 AM    

## 2012-05-20 ENCOUNTER — Encounter (HOSPITAL_COMMUNITY): Payer: Self-pay | Admitting: Respiratory Therapy

## 2012-05-23 NOTE — Pre-Procedure Instructions (Signed)
20 Tina Hardy  05/23/2012   Your procedure is scheduled on:  Friday May 30, 2012  Report to [facility] at 5:30 AM.  Call this number if you have problems the morning of surgery: [phone]   Remember:   Do not eat food or drink After Midnight.   Take these medicines the morning of surgery with A SIP OF WATER: none   Do not wear jewelry, make-up or nail polish.  Do not wear lotions, powders, or perfumes.  Do not shave 48 hours prior to surgery.  Do not bring valuables to the hospital.  Contacts, dentures or bridgework may not be worn into surgery.  Leave suitcase in the car. After surgery it may be brought to your room.  For patients admitted to the hospital, checkout time is 11:00 AM the day of discharge.   Patients discharged the day of surgery will not be allowed to drive home.  Name and phone number of your driver: family / friend  Special Instructions: Shower using CHG 2 nights before surgery and the night before surgery.  If you shower the day of surgery use CHG.  Use special wash - you have one bottle of CHG for all showers.  You should use approximately 1/3 of the bottle for each shower.   Please read over the following fact sheets that you were given: Pain Booklet, Coughing and Deep Breathing, MRSA Information and Surgical Site Infection Prevention

## 2012-05-26 ENCOUNTER — Encounter (HOSPITAL_COMMUNITY): Payer: Self-pay

## 2012-05-26 ENCOUNTER — Encounter (HOSPITAL_COMMUNITY)
Admission: RE | Admit: 2012-05-26 | Discharge: 2012-05-26 | Disposition: A | Discharge status: Home / Self Care | Payer: Medicare Other | Source: Ambulatory Visit | Attending: General Surgery | Admitting: General Surgery

## 2012-05-26 LAB — CBC WITH DIFFERENTIAL/PLATELET
Basophils Absolute: 0 10*3/uL (ref 0.0–0.1)
Basophils Relative: 0 % (ref 0–1)
Eosinophils Absolute: 0.1 10*3/uL (ref 0.0–0.7)
Eosinophils Relative: 2 % (ref 0–5)
MCH: 30.5 pg (ref 26.0–34.0)
MCHC: 32.6 g/dL (ref 30.0–36.0)
MCV: 93.5 fL (ref 78.0–100.0)
Monocytes Absolute: 0.7 10*3/uL (ref 0.1–1.0)
Platelets: 401 10*3/uL — ABNORMAL HIGH (ref 150–400)
RDW: 12.7 % (ref 11.5–15.5)
WBC: 8.1 10*3/uL (ref 4.0–10.5)

## 2012-05-26 LAB — COMPREHENSIVE METABOLIC PANEL
ALT: 18 U/L (ref 0–35)
AST: 17 U/L (ref 0–37)
Calcium: 9.5 mg/dL (ref 8.4–10.5)
Creatinine, Ser: 0.59 mg/dL (ref 0.50–1.10)
Sodium: 140 mEq/L (ref 135–145)
Total Protein: 6.7 g/dL (ref 6.0–8.3)

## 2012-05-26 NOTE — Progress Notes (Signed)
Primary Physician - Dr. Jacky Kindle Does not have a cardiologist  No recent cardiac history

## 2012-05-29 MED ORDER — CIPROFLOXACIN IN D5W 400 MG/200ML IV SOLN
400.0000 mg | INTRAVENOUS | Status: AC
  Administered 2012-05-30: 400 mg via INTRAVENOUS
  Filled 2012-05-29: qty 200

## 2012-05-30 ENCOUNTER — Ambulatory Visit (HOSPITAL_COMMUNITY): Payer: Medicare Other | Admitting: Anesthesiology

## 2012-05-30 ENCOUNTER — Ambulatory Visit (HOSPITAL_COMMUNITY)
Admission: RE | Admit: 2012-05-30 | Discharge: 2012-05-30 | Disposition: A | Discharge status: Home / Self Care | Payer: Medicare Other | Source: Ambulatory Visit | Attending: General Surgery | Admitting: General Surgery

## 2012-05-30 ENCOUNTER — Encounter (HOSPITAL_COMMUNITY): Payer: Self-pay | Admitting: Surgery

## 2012-05-30 ENCOUNTER — Encounter (HOSPITAL_COMMUNITY)
Admission: RE | Disposition: A | Discharge status: Home / Self Care | Payer: Self-pay | Source: Ambulatory Visit | Attending: General Surgery

## 2012-05-30 ENCOUNTER — Encounter (HOSPITAL_COMMUNITY): Payer: Self-pay | Admitting: Anesthesiology

## 2012-05-30 ENCOUNTER — Ambulatory Visit (HOSPITAL_COMMUNITY): Payer: Medicare Other

## 2012-05-30 DIAGNOSIS — Z860100 Personal history of colon polyps, unspecified: Secondary | ICD-10-CM | POA: Insufficient documentation

## 2012-05-30 DIAGNOSIS — K8010 Calculus of gallbladder with chronic cholecystitis without obstruction: Secondary | ICD-10-CM

## 2012-05-30 DIAGNOSIS — Z83719 Family history of colon polyps, unspecified: Secondary | ICD-10-CM | POA: Insufficient documentation

## 2012-05-30 DIAGNOSIS — Z87891 Personal history of nicotine dependence: Secondary | ICD-10-CM | POA: Insufficient documentation

## 2012-05-30 DIAGNOSIS — Z8371 Family history of colonic polyps: Secondary | ICD-10-CM | POA: Insufficient documentation

## 2012-05-30 DIAGNOSIS — Z9089 Acquired absence of other organs: Secondary | ICD-10-CM | POA: Insufficient documentation

## 2012-05-30 HISTORY — PX: CHOLECYSTECTOMY: SHX55

## 2012-05-30 SURGERY — LAPAROSCOPIC CHOLECYSTECTOMY WITH INTRAOPERATIVE CHOLANGIOGRAM
Anesthesia: General | Wound class: Contaminated

## 2012-05-30 MED ORDER — OXYCODONE HCL 5 MG PO TABS
5.0000 mg | ORAL_TABLET | ORAL | Status: DC | PRN
  Administered 2012-05-30: 10 mg via ORAL

## 2012-05-30 MED ORDER — LACTATED RINGERS IV SOLN
INTRAVENOUS | Status: DC | PRN
  Administered 2012-05-30 (×2): via INTRAVENOUS

## 2012-05-30 MED ORDER — SODIUM CHLORIDE 0.9 % IJ SOLN: 3.0000 mL | INTRAMUSCULAR | Status: DC | PRN

## 2012-05-30 MED ORDER — BUPIVACAINE-EPINEPHRINE 0.25% -1:200000 IJ SOLN
INTRAMUSCULAR | Status: DC | PRN
  Administered 2012-05-30: 21 mL

## 2012-05-30 MED ORDER — MORPHINE SULFATE 2 MG/ML IJ SOLN: 1.0000 mg | INTRAMUSCULAR | Status: DC | PRN

## 2012-05-30 MED ORDER — LIDOCAINE HCL (CARDIAC) 20 MG/ML IV SOLN
INTRAVENOUS | Status: DC | PRN
  Administered 2012-05-30: 50 mg via INTRAVENOUS

## 2012-05-30 MED ORDER — 0.9 % SODIUM CHLORIDE (POUR BTL) OPTIME
TOPICAL | Status: DC | PRN
  Administered 2012-05-30: 1000 mL

## 2012-05-30 MED ORDER — GLYCOPYRROLATE 0.2 MG/ML IJ SOLN
INTRAMUSCULAR | Status: DC | PRN
  Administered 2012-05-30: 0.4 mg via INTRAVENOUS

## 2012-05-30 MED ORDER — LIDOCAINE HCL 4 % MT SOLN
OROMUCOSAL | Status: DC | PRN
  Administered 2012-05-30: 4 mL via TOPICAL

## 2012-05-30 MED ORDER — ROCURONIUM BROMIDE 100 MG/10ML IV SOLN
INTRAVENOUS | Status: DC | PRN
  Administered 2012-05-30: 25 mg via INTRAVENOUS

## 2012-05-30 MED ORDER — SODIUM CHLORIDE 0.9 % IV SOLN: INTRAVENOUS | Status: DC

## 2012-05-30 MED ORDER — HEMOSTATIC AGENTS (NO CHARGE) OPTIME
TOPICAL | Status: DC | PRN
  Administered 2012-05-30: 1 via TOPICAL

## 2012-05-30 MED ORDER — NEOSTIGMINE METHYLSULFATE 1 MG/ML IJ SOLN
INTRAMUSCULAR | Status: DC | PRN
  Administered 2012-05-30: 3 mg via INTRAVENOUS

## 2012-05-30 MED ORDER — ONDANSETRON HCL 4 MG/2ML IJ SOLN: 4.0000 mg | Freq: Four times a day (QID) | INTRAMUSCULAR | Status: DC | PRN

## 2012-05-30 MED ORDER — ACETAMINOPHEN 325 MG PO TABS
650.0000 mg | ORAL_TABLET | ORAL | Status: DC | PRN
Start: 2012-05-30 — End: 2012-05-30
  Filled 2012-05-30: qty 2

## 2012-05-30 MED ORDER — FENTANYL CITRATE 0.05 MG/ML IJ SOLN
INTRAMUSCULAR | Status: DC | PRN
  Administered 2012-05-30: 50 ug via INTRAVENOUS
  Administered 2012-05-30: 100 ug via INTRAVENOUS
  Administered 2012-05-30: 50 ug via INTRAVENOUS

## 2012-05-30 MED ORDER — BUPIVACAINE-EPINEPHRINE 0.25% -1:200000 IJ SOLN
INTRAMUSCULAR | Status: AC
  Filled 2012-05-30: qty 1

## 2012-05-30 MED ORDER — ACETAMINOPHEN 650 MG RE SUPP
650.0000 mg | RECTAL | Status: DC | PRN
  Filled 2012-05-30: qty 1

## 2012-05-30 MED ORDER — SODIUM CHLORIDE 0.9 % IV SOLN
INTRAVENOUS | Status: DC | PRN
  Administered 2012-05-30: 08:00:00

## 2012-05-30 MED ORDER — PHENYLEPHRINE HCL 10 MG/ML IJ SOLN
INTRAMUSCULAR | Status: DC | PRN
  Administered 2012-05-30: 80 ug via INTRAVENOUS

## 2012-05-30 MED ORDER — ONDANSETRON HCL 4 MG/2ML IJ SOLN: 4.0000 mg | Freq: Once | INTRAMUSCULAR | Status: DC | PRN

## 2012-05-30 MED ORDER — PROPOFOL 10 MG/ML IV BOLUS
INTRAVENOUS | Status: DC | PRN
  Administered 2012-05-30: 150 mg via INTRAVENOUS

## 2012-05-30 MED ORDER — MIDAZOLAM HCL 5 MG/5ML IJ SOLN
INTRAMUSCULAR | Status: DC | PRN
  Administered 2012-05-30: 2 mg via INTRAVENOUS

## 2012-05-30 MED ORDER — SODIUM CHLORIDE 0.9 % IV SOLN: 250.0000 mL | INTRAVENOUS | Status: DC | PRN

## 2012-05-30 MED ORDER — SODIUM CHLORIDE 0.9 % IR SOLN
Status: DC | PRN
  Administered 2012-05-30: 1000 mL

## 2012-05-30 MED ORDER — HYDROMORPHONE HCL PF 1 MG/ML IJ SOLN
0.2500 mg | INTRAMUSCULAR | Status: DC | PRN
  Administered 2012-05-30 (×2): 0.5 mg via INTRAVENOUS

## 2012-05-30 MED ORDER — DEXAMETHASONE SODIUM PHOSPHATE 4 MG/ML IJ SOLN
INTRAMUSCULAR | Status: DC | PRN
  Administered 2012-05-30: 8 mg via INTRAVENOUS

## 2012-05-30 MED ORDER — ONDANSETRON HCL 4 MG/2ML IJ SOLN
INTRAMUSCULAR | Status: DC | PRN
  Administered 2012-05-30: 4 mg via INTRAVENOUS

## 2012-05-30 MED ORDER — SODIUM CHLORIDE 0.9 % IJ SOLN: 3.0000 mL | Freq: Two times a day (BID) | INTRAMUSCULAR | Status: DC

## 2012-05-30 MED ORDER — HYDROMORPHONE HCL PF 1 MG/ML IJ SOLN
INTRAMUSCULAR | Status: AC
  Filled 2012-05-30: qty 1

## 2012-05-30 MED ORDER — OXYCODONE HCL 5 MG PO TABS
ORAL_TABLET | ORAL | Status: AC
  Filled 2012-05-30: qty 2

## 2012-05-30 MED ORDER — HYDROCODONE-ACETAMINOPHEN 5-325 MG PO TABS: 1.0000 | ORAL_TABLET | Freq: Four times a day (QID) | ORAL | Status: DC | PRN

## 2012-05-30 SURGICAL SUPPLY — 49 items
APL SKNCLS STERI-STRIP NONHPOA (GAUZE/BANDAGES/DRESSINGS) ×1
APPLIER CLIP 5 13 M/L LIGAMAX5 (MISCELLANEOUS) ×2
APR CLP MED LRG 5 ANG JAW (MISCELLANEOUS) ×1
BAG SPEC RTRVL LRG 6X4 10 (ENDOMECHANICALS) ×1
BANDAGE ADHESIVE 1X3 (GAUZE/BANDAGES/DRESSINGS) ×6 IMPLANT
BENZOIN TINCTURE PRP APPL 2/3 (GAUZE/BANDAGES/DRESSINGS) ×2 IMPLANT
BLADE SURG ROTATE 9660 (MISCELLANEOUS) IMPLANT
CANISTER SUCTION 2500CC (MISCELLANEOUS) ×2 IMPLANT
CHLORAPREP W/TINT 26ML (MISCELLANEOUS) ×2 IMPLANT
CLIP APPLIE 5 13 M/L LIGAMAX5 (MISCELLANEOUS) ×1 IMPLANT
CLOTH BEACON ORANGE TIMEOUT ST (SAFETY) ×2 IMPLANT
COVER MAYO STAND STRL (DRAPES) ×2 IMPLANT
COVER SURGICAL LIGHT HANDLE (MISCELLANEOUS) ×2 IMPLANT
DECANTER SPIKE VIAL GLASS SM (MISCELLANEOUS) ×2 IMPLANT
DRAPE C-ARM 42X72 X-RAY (DRAPES) ×2 IMPLANT
DRAPE UTILITY 15X26 W/TAPE STR (DRAPE) ×4 IMPLANT
DRSG TEGADERM 4X4.75 (GAUZE/BANDAGES/DRESSINGS) ×2 IMPLANT
ELECT REM PT RETURN 9FT ADLT (ELECTROSURGICAL) ×2
ELECTRODE REM PT RTRN 9FT ADLT (ELECTROSURGICAL) ×1 IMPLANT
GAUZE SPONGE 2X2 8PLY STRL LF (GAUZE/BANDAGES/DRESSINGS) ×1 IMPLANT
GLOVE BIO SURGEON STRL SZ8 (GLOVE) ×2 IMPLANT
GLOVE BIOGEL M STRL SZ7.5 (GLOVE) ×2 IMPLANT
GLOVE BIOGEL PI IND STRL 7.0 (GLOVE) IMPLANT
GLOVE BIOGEL PI IND STRL 8 (GLOVE) ×1 IMPLANT
GLOVE BIOGEL PI INDICATOR 7.0 (GLOVE) ×1
GLOVE BIOGEL PI INDICATOR 8 (GLOVE) ×2
GLOVE SURG SS PI 6.5 STRL IVOR (GLOVE) ×1 IMPLANT
GLOVE SURG SS PI 7.0 STRL IVOR (GLOVE) ×2 IMPLANT
GOWN PREVENTION PLUS XLARGE (GOWN DISPOSABLE) ×3 IMPLANT
GOWN STRL NON-REIN LRG LVL3 (GOWN DISPOSABLE) ×5 IMPLANT
KIT BASIN OR (CUSTOM PROCEDURE TRAY) ×2 IMPLANT
KIT ROOM TURNOVER OR (KITS) ×2 IMPLANT
NS IRRIG 1000ML POUR BTL (IV SOLUTION) ×2 IMPLANT
PAD ARMBOARD 7.5X6 YLW CONV (MISCELLANEOUS) ×2 IMPLANT
POUCH SPECIMEN RETRIEVAL 10MM (ENDOMECHANICALS) ×2 IMPLANT
SCISSORS LAP 5X35 DISP (ENDOMECHANICALS) IMPLANT
SET CHOLANGIOGRAPH 5 50 .035 (SET/KITS/TRAYS/PACK) ×2 IMPLANT
SET IRRIG TUBING LAPAROSCOPIC (IRRIGATION / IRRIGATOR) ×2 IMPLANT
SLEEVE ENDOPATH XCEL 5M (ENDOMECHANICALS) ×4 IMPLANT
SPECIMEN JAR SMALL (MISCELLANEOUS) ×2 IMPLANT
SPONGE GAUZE 2X2 STER 10/PKG (GAUZE/BANDAGES/DRESSINGS) ×1
STRIP CLOSURE SKIN 1/2X4 (GAUZE/BANDAGES/DRESSINGS) ×1 IMPLANT
SUT MNCRL AB 4-0 PS2 18 (SUTURE) ×2 IMPLANT
SUT VICRYL 0 UR6 27IN ABS (SUTURE) ×1 IMPLANT
TOWEL OR 17X24 6PK STRL BLUE (TOWEL DISPOSABLE) ×2 IMPLANT
TOWEL OR 17X26 10 PK STRL BLUE (TOWEL DISPOSABLE) ×2 IMPLANT
TRAY LAPAROSCOPIC (CUSTOM PROCEDURE TRAY) ×2 IMPLANT
TROCAR XCEL BLUNT TIP 100MML (ENDOMECHANICALS) ×2 IMPLANT
TROCAR XCEL NON-BLD 5MMX100MML (ENDOMECHANICALS) ×2 IMPLANT

## 2012-05-30 NOTE — Transfer of Care (Signed)
Immediate Anesthesia Transfer of Care Note  Patient: Tina Hardy  Procedure(s) Performed: Procedure(s) (LRB) with comments: LAPAROSCOPIC CHOLECYSTECTOMY WITH INTRAOPERATIVE CHOLANGIOGRAM (N/A)  Patient Location: PACU  Anesthesia Type:General  Level of Consciousness: awake, alert , oriented and patient cooperative  Airway & Oxygen Therapy: Patient Spontanous Breathing and Patient connected to nasal cannula oxygen  Post-op Assessment: Report given to PACU RN, Post -op Vital signs reviewed and stable and Patient moving all extremities  Post vital signs: Reviewed and stable  Complications: No apparent anesthesia complications

## 2012-05-30 NOTE — H&P (View-Only) (Signed)
Patient ID: Tina Hardy, female   DOB: 1940/12/14, 72 y.o.   MRN: [id number]  Chief Complaint  Patient presents with  . Abdominal Pain    HPI Tina Hardy is a 72 y.o. female.   HPI 72 yo WF referred by Dr Juanda Chance for evaluation of gallstones. She was seen several weeks ago in Dr. Regino Schultze office for abdominal pain and back pain. She had lab work performed which demonstrated a mild pancreatitis. Her amylase and lipase were just mildly elevated. She underwent abdominal ultrasound which showed a large gallstone of over 2 cm. She was referred here for evaluation. The patient states that she has pressure and discomfort in her lower abdomen. It is constant. She rates it a 6/10. She states that it is more noticeable with activity. It can radiate to her back. She states that it feels like something has dropped into her lower abdomen. She denies fever, chills, nausea, weight loss, jaundice, acholic stools. She denies diarrhea or constipation. She denies stool caliber changes. She did vomit once after lunch earlier in the week. She also complains of some bloating. She has had some mild intermittent discomfort of her right side as well. She denies any difficulty swallowing liquids or solids. She denies any NSAID use. She does drink wine several times a week. She has not had any medication changes in the past month and a half. She has had a prior laparoscopic repair of a left indirect inguinal hernia with mesh along with an open primary umbilical hernia repair in 2008 by Dr. Michaell Cowing. She underwent laparoscopic appendectomy by Dr. Abbey Chatters in 2012. She denies any dysuria. She denies any incontinence. She denies any vaginal bleeding. Past Medical History  Diagnosis Date  . Liver hemangioma   . Gallstones   . Diverticulosis   . Colon polyp   . Pernicious anemia     Past Surgical History  Procedure Date  . Appendectomy 2012  . Umbilical hernia repair 12/24/06    with reduction of sigmoid colon which was  incarcerated  . Knee surgery     right  . Inguinal hernia repair 12/24/06    left; laparoscopic    Family History  Problem Relation Age of Onset  . Colon polyps    . Crohn's disease Other     neice  . Crohn's disease Other     nephew    Social History History  Substance Use Topics  . Smoking status: Former Smoker    Quit date: 12/04/1972  . Smokeless tobacco: Never Used  . Alcohol Use: Yes     Comment: 1 per day    Allergies  Allergen Reactions  . Codeine     REACTION: questionable  . Penicillins     REACTION: questionable    Current Outpatient Prescriptions  Medication Sig Dispense Refill  . Ascorbic Acid (VITAMIN C PO) Take by mouth daily.      . fish oil-omega-3 fatty acids 1000 MG capsule Take 2 g by mouth daily.        . Misc Natural Products (TART CHERRY ADVANCED PO) Take by mouth daily.      . Multiple Vitamin (MULTIVITAMIN) tablet Take 1 tablet by mouth daily.      . Vitamins-Lipotropics (VIT BALANCED B-100 PO) Take by mouth.          Review of Systems Review of Systems  Constitutional: Negative for fever, chills, activity change, appetite change and unexpected weight change.       Walks several times a week  HENT: Negative for hearing loss and neck stiffness.   Eyes: Negative for photophobia and visual disturbance.  Respiratory: Negative for chest tightness and shortness of breath.   Cardiovascular: Negative for chest pain, palpitations and leg swelling.  Gastrointestinal:       See hpi  Genitourinary: Positive for pelvic pain. Negative for dysuria, hematuria, vaginal bleeding, vaginal discharge, difficulty urinating and menstrual problem.  Musculoskeletal: Negative for arthralgias and gait problem.  Neurological: Negative for tremors, syncope, speech difficulty, light-headedness, numbness and headaches.  Hematological: Negative for adenopathy. Does not bruise/bleed easily.  Psychiatric/Behavioral: Negative for confusion.    Blood pressure 132/78,  pulse 74, temperature 97.8 F (36.6 C), temperature source Temporal, resp. rate 16, height 5\' 5"  (1.651 m), weight 155 lb 8 oz (70.534 kg).  Physical Exam Physical Exam  Vitals reviewed. Constitutional: She is oriented to person, place, and time. She appears well-developed and well-nourished. No distress.  HENT:  Head: Normocephalic and atraumatic.  Right Ear: External ear normal.  Left Ear: External ear normal.  Eyes: Conjunctivae normal are normal. No scleral icterus.  Neck: Normal range of motion. Neck supple. No tracheal deviation present. No thyromegaly present.  Cardiovascular: Normal rate, regular rhythm and normal heart sounds.   Pulmonary/Chest: Effort normal and breath sounds normal. No respiratory distress. She has no wheezes.  Abdominal: Soft. Bowel sounds are normal. She exhibits no distension. There is no tenderness. There is no rebound and no guarding. Hernia confirmed negative in the right inguinal area and confirmed negative in the left inguinal area.    Musculoskeletal: She exhibits no edema and no tenderness.  Neurological: She is alert and oriented to person, place, and time. She exhibits normal muscle tone.  Skin: Skin is warm and dry. She is not diaphoretic.  Psychiatric: She has a normal mood and affect. Her behavior is normal. Judgment and thought content normal.    Data Reviewed Dr Regino Schultze note Labs - nml lfts; mildly elevated amylase, lipase but normalized  COMPLETE ABDOMINAL ULTRASOUND  Comparison: CT of the abdomen and pelvis 10/28/2006  Findings:  Gallbladder: Within the gallbladder, a large, calcified gallstone  measures 2.2 cm in diameter. Gallstone is within the mid aspect of  the gallbladder. Note is made of adenomyomatosis within the  gallbladder wall. Gallbladder wall is otherwise normal in  thickness, measuring 1.5 mm. No sonographic Murphy's sign or  pericholecystic fluid identified.  Common bile duct: 4.2 mm  Liver: Liver echotexture is  homogeneous. There are several  discrete cysts. Within the liver. In the right hepatic lobe, a  cyst is 1.3 x 1.4 x 1.4 cm. Within the lower aspect of the right  hepatic lobe, a cyst is 1.4 x 1.0 x 1.7 cm. Within the right  hepatic lobe, a mixed echogenicity liver lesion is 3.4 x 4.4 x 3.9  cm. On previous CT exam, characteristics are consistent with  benign hemangioma.  IVC: Appears normal.  Pancreas: No focal abnormality seen.  Spleen: The spleen has a normal appearance and measures 5.9 cm in  length.  Right Kidney: Normal appearance, 10.6 cm in length.  Left Kidney: Normal appearance, 9.3 cm in length.  Abdominal aorta: Not aneurysmal. Bifurcation is not evaluated  because of overlying bowel gas.   IMPRESSION:  1. Single large gallstone, 2.2 cm.  2. Adenomyomatosis within the gallbladder wall. No evidence for  acute cholecystitis.  3. Liver cysts and liver hemangioma.  4. No evidence for hydronephrosis or renal mass.   Assessment    Cholelithiasis Mild  pancreatitis    Plan    It seems her symptoms have changed since she saw Dr. Juanda Chance a few weeks ago. She states the majority of her pain is in her lower abdomen.she states that she has had some mild nonspecific discomfort in her upper abdomen and on the right side. I'm not sure if her gallbladder is the etiology of this lower abdominal discomfort. However based on the ultrasound findings of a 2 cm gallstone as well as adenomyomatosis within the gallbladder wall I have recommended a cholecystectomy. I explained to her and her husband that a cholecystectomy may not ameliorate all of her abdominal complaints. I explained that if she continues to have lower abdominal and pelvic discomfort after cholecystectomy that she may need additional workup. Her amylase and lipase were just mildly elevated on one occurrence so I am not terribly impressed with her pancreatitis.  We discussed gallbladder disease. The patient was given Programmer, multimedia. We discussed non-operative and operative management. We discussed the signs & symptoms of acute cholecystitis  I discussed laparoscopic cholecystectomy with IOC in detail.  The patient was given educational material as well as diagrams detailing the procedure.  We discussed the risks and benefits of a laparoscopic cholecystectomy including, but not limited to bleeding, infection, injury to surrounding structures such as the intestine or liver, bile leak, retained gallstones, need to convert to an open procedure, prolonged diarrhea, blood clots such as  DVT, common bile duct injury, anesthesia risks, and possible need for additional procedures.  We discussed the typical post-operative recovery course. I explained that the likelihood of improvement of their symptoms is fair.  Mary Sella. Andrey Campanile, MD, FACS General, Bariatric, & Minimally Invasive Surgery [facility], [place]        [facility] M 05/01/2012, 9:52 AM

## 2012-05-30 NOTE — Anesthesia Preprocedure Evaluation (Addendum)
Anesthesia Evaluation  Patient identified by MRN, date of birth, ID band Patient awake    Reviewed: Allergy & Precautions, H&P , NPO status , [name] Chart, lab work & pertinent test results  History of Anesthesia Complications Negative for: history of anesthetic complications  Airway Mallampati: I TM Distance: >3 FB Neck ROM: full    Dental   Pulmonary          Cardiovascular Rhythm:regular Rate:Normal     Neuro/Psych    GI/Hepatic Hepatic hemangioma, colon polyps , diverticulosis cholelithiasis   Endo/Other    Renal/GU      Musculoskeletal   Abdominal   Peds  Hematology  (+) anemia ,   Anesthesia Other Findings   Reproductive/Obstetrics                          Anesthesia Physical Anesthesia Plan  ASA: I  Anesthesia Plan: General   Post-op Pain Management:    Induction: Intravenous  Airway Management Planned: Oral ETT  Additional Equipment:   Intra-op Plan:   Post-operative Plan: Extubation in OR  Informed Consent: I have reviewed the patients History and Physical, chart, labs and discussed the procedure including the risks, benefits and alternatives for the proposed anesthesia with the patient or authorized representative who has indicated his/her understanding and acceptance.   Dental advisory given  Plan Discussed with: CRNA, Anesthesiologist and Surgeon  Anesthesia Plan Comments:        Anesthesia Quick Evaluation

## 2012-05-30 NOTE — Op Note (Signed)
Laparoscopic Cholecystectomy with IOC Procedure Note  Indications: This patient presents with symptomatic gallbladder disease as well as a history of very mild pancreatitis and will undergo laparoscopic cholecystectomy.  Pre-operative Diagnosis: Cholelithiasis, history of pancreatitis  Post-operative Diagnosis: Same, hepatic cysts  Surgeon: Atilano Ina   Assistants: none  Anesthesia: General endotracheal anesthesia  ASA Class: 1  Procedure Details  The patient was seen again in the Holding Room. The risks, benefits, complications, treatment options, and expected outcomes were discussed with the patient. The possibilities of reaction to medication, pulmonary aspiration, perforation of viscus, bleeding, recurrent infection, finding a normal gallbladder, the need for additional procedures, failure to diagnose a condition, the possible need to convert to an open procedure, and creating a complication requiring transfusion or operation were discussed with the patient. The likelihood of improving the patient's symptoms with return to their baseline status is good.  The patient and/or family concurred with the proposed plan, giving informed consent. The site of surgery properly noted. The patient was taken to Operating Room, identified as Tina Hardy and the procedure verified as Laparoscopic Cholecystectomy with Intraoperative Cholangiogram. A Time Out was held and the above information confirmed.  Prior to the induction of general anesthesia, antibiotic prophylaxis was administered. General endotracheal anesthesia was then administered and tolerated well. After the induction, the abdomen was prepped with Chloraprep and draped in the sterile fashion. The patient was positioned in the supine position.  Local anesthetic agent was injected into the skin near the umbilicus and an incision made through her old infraumbilical incision. We dissected down to the abdominal fascia with blunt dissection.   The fascia was incised vertically and we entered the peritoneal cavity bluntly.  A pursestring suture of 0-Vicryl was placed around the fascial opening.  The Hasson cannula was inserted and secured with the stay suture.  Pneumoperitoneum was then created with CO2 and tolerated well without any adverse changes in the patient's vital signs. An 5-mm port was placed in the subxiphoid position.  Two 5-mm ports were placed in the right upper quadrant. All skin incisions were infiltrated with a local anesthetic agent before making the incision and placing the trocars.   We positioned the patient in reverse Trendelenburg, tilted slightly to the patient's left.  There were several simple appearing cysts in the right lobe of the liver. The gallbladder was identified, the fundus grasped and retracted cephalad. Adhesions were lysed bluntly and with the electrocautery where indicated, taking care not to injure any adjacent organs or viscus. The infundibulum was grasped and retracted laterally, exposing the peritoneum overlying the triangle of Calot. This was then divided and exposed in a blunt fashion. A critical view of the cystic duct and cystic artery was obtained.  The cystic duct was clearly identified and bluntly dissected circumferentially. The cystic duct was ligated with a clip distally.   An incision was made in the cystic duct and the [facility] cholangiogram catheter introduced. The catheter was secured using a clip. A cholangiogram was then obtained which showed good visualization of the distal and proximal biliary tree with no sign of filling defects or obstruction.  Contrast flowed easily into the duodenum.  However, the distal CBD did appear a little narrowed. The catheter was then removed.   The cystic duct was then ligated with clips and divided. The cystic artery was identified, dissected free, ligated with clips and divided as well.   The gallbladder was dissected from the liver bed in retrograde fashion with  the electrocautery. There  was some spillage of bile from the gallbladder but no stone spillage. The gallbladder was removed and placed in an Endocatch sac.  The gallbladder and Endocatch sac were then removed through the umbilical port site. The liver bed was irrigated and inspected. Hemostasis was achieved with the electrocautery. A piece of [employer] SNoW was placed in the gallbladder fossa. Copious irrigation was utilized and was repeatedly aspirated until clear.  The pursestring suture was used to close the umbilical fascia.  An additional interrupted 0-vicryl suture was placed at the umbilical fascial closure.   We again inspected the right upper quadrant for hemostasis.  The umbilical closure was inspected and there was no air leak and nothing trapped within the closure. Pneumoperitoneum was released as we removed the trocars.  4-0 Monocryl was used to close the skin.   Benzoin, steri-strips, and clean dressings were applied. The patient was then extubated and brought to the recovery room in stable condition. Instrument, sponge, and needle counts were correct at closure and at the conclusion of the case.   Findings:  Cholelithiasis, simple appearing hepatic cysts  Estimated Blood Loss: Minimal         Drains: none         Specimens: Gallbladder           Complications: None; patient tolerated the procedure well.         Disposition: PACU - hemodynamically stable.         Condition: stable

## 2012-05-30 NOTE — Anesthesia Postprocedure Evaluation (Signed)
  Anesthesia Post-op Note  Patient: Tina Hardy  Procedure(s) Performed: Procedure(s) (LRB) with comments: LAPAROSCOPIC CHOLECYSTECTOMY WITH INTRAOPERATIVE CHOLANGIOGRAM (N/A)  Patient Location: PACU  Anesthesia Type:General  Level of Consciousness: awake, alert , oriented and patient cooperative  Airway and Oxygen Therapy: Patient Spontanous Breathing  Post-op Pain: mild  Post-op Assessment: Post-op Vital signs reviewed, [name] Cardiovascular Status Stable, Respiratory Function Stable, Patent Airway, No signs of Nausea or vomiting and Pain level controlled  Post-op Vital Signs: stable  Complications: No apparent anesthesia complications

## 2012-05-30 NOTE — Preoperative (Signed)
Beta Blockers   Reason not to administer Beta Blockers:Not Applicable 

## 2012-05-30 NOTE — Interval H&P Note (Signed)
History and Physical Interval Note:  05/30/2012 7:22 AM  Tina Hardy  has presented today for surgery, with the diagnosis of Cholelithiasis pancreatitis  The various methods of treatment have been discussed with the patient and family. After consideration of risks, benefits and other options for treatment, the patient has consented to  Procedure(s) (LRB) with comments: LAPAROSCOPIC CHOLECYSTECTOMY WITH INTRAOPERATIVE CHOLANGIOGRAM (N/A) as a surgical intervention .  The patient's history has been reviewed, patient examined, no change in status, stable for surgery.  I have reviewed the patient's chart and labs.  Questions were answered to the patient's satisfaction.    Mary Sella. Andrey Campanile, MD, FACS General, Bariatric, & Minimally Invasive Surgery [facility] Surgery, [place]  [facility] M

## 2012-06-02 ENCOUNTER — Encounter (HOSPITAL_COMMUNITY): Payer: Self-pay | Admitting: General Surgery

## 2012-06-09 ENCOUNTER — Telehealth (INDEPENDENT_AMBULATORY_CARE_PROVIDER_SITE_OTHER): Payer: Self-pay | Admitting: General Surgery

## 2012-06-09 NOTE — Telephone Encounter (Signed)
Pt called in letting us know that she has gotten all of her steri-strips off except the one over her navel.  She wanted to know if she could pull this off.  He surgery was on 1/10 for lap chole.  I explained that once the steri-strips start curling on the edges and able to flake off is when she could take it off.  I made sure to explain that if it looks like it is going to take off skin then she should let it work itself off for a little bit longer.  She said she understood.

## 2012-06-25 ENCOUNTER — Encounter (INDEPENDENT_AMBULATORY_CARE_PROVIDER_SITE_OTHER): Payer: Self-pay | Admitting: General Surgery

## 2012-06-25 ENCOUNTER — Ambulatory Visit (INDEPENDENT_AMBULATORY_CARE_PROVIDER_SITE_OTHER): Payer: Medicare Other | Admitting: General Surgery

## 2012-06-25 VITALS — BP 128/70 | HR 88 | Resp 18 | Ht 65.5 in | Wt 153.0 lb

## 2012-06-25 DIAGNOSIS — Z09 Encounter for follow-up examination after completed treatment for conditions other than malignant neoplasm: Secondary | ICD-10-CM

## 2012-06-25 NOTE — Patient Instructions (Signed)
Can resume full activities 

## 2012-06-25 NOTE — Progress Notes (Signed)
Subjective:     Patient ID: Tina Hardy, female   DOB: 06-16-1940, 72 y.o.   MRN: [id number]  HPI 72 year old Caucasian female comes in for her postoperative check after undergoing laparoscopic cholecystectomy with cholangiogram on January 10 for abdominal pain.She also had a mild elevation of her amylase and lipase in the past. She also complained of a sensation of something dropping into her lower abdomen.She denies any fevers, chills, nausea, vomiting, diarrhea or constipation. She denies any trouble with her incisions. She states that she put up her jewelry the day before going to the hospital for surgery and she cannot find it. She states that she still has some discomfort in her pelvic area which has been present for the past month or 2  Review of Systems     Objective:   Physical Exam BP 128/70  Pulse 88  Resp 18  Ht 5' 5.5" (1.664 m)  Wt 153 lb (69.4 kg)  BMI 25.07 kg/m2 Alert, no apparent distress Abdomen-soft, nontender, nondistended. Well-healed trocar incisions. No signs of incisional hernia.    Assessment:     S/p laparoscopic cholecystectomy with [facility] 05/30/12    Plan:     We discussed her pathology report which revealed chronic cholecystitis and cholelithiasis. I have released her to full activities. I told her she could resume going to the gym. She was given a copy of her pathology report. I'm not sure as to the exact etiology of her pelvic discomfort. I encouraged her to discuss with her PCP or gynecologist. Followup when necessary  Mary Sella. Andrey Campanile, MD, FACS General, Bariatric, & Minimally Invasive Surgery [facility], [place]

## 2012-07-02 ENCOUNTER — Encounter: Payer: Medicare Other | Admitting: Obstetrics and Gynecology

## 2012-07-04 ENCOUNTER — Encounter: Payer: Medicare Other | Admitting: Obstetrics and Gynecology

## 2012-07-07 ENCOUNTER — Other Ambulatory Visit: Payer: Self-pay | Admitting: Internal Medicine

## 2012-07-07 DIAGNOSIS — R102 Pelvic and perineal pain: Secondary | ICD-10-CM

## 2012-07-08 ENCOUNTER — Ambulatory Visit
Admission: RE | Admit: 2012-07-08 | Discharge: 2012-07-08 | Disposition: A | Discharge status: Home / Self Care | Payer: Medicare Other | Source: Ambulatory Visit | Attending: Internal Medicine | Admitting: Internal Medicine

## 2012-07-08 DIAGNOSIS — R102 Pelvic and perineal pain: Secondary | ICD-10-CM

## 2012-07-08 MED ORDER — IOHEXOL 300 MG/ML  SOLN
100.0000 mL | Freq: Once | INTRAMUSCULAR | Status: AC | PRN
  Administered 2012-07-08: 100 mL via INTRAVENOUS

## 2012-07-16 ENCOUNTER — Ambulatory Visit (INDEPENDENT_AMBULATORY_CARE_PROVIDER_SITE_OTHER): Payer: Medicare Other | Admitting: Physician Assistant

## 2012-07-16 ENCOUNTER — Encounter: Payer: Self-pay | Admitting: Physician Assistant

## 2012-07-16 VITALS — BP 150/80 | HR 80 | Ht 65.5 in | Wt 156.8 lb

## 2012-07-16 DIAGNOSIS — D1803 Hemangioma of intra-abdominal structures: Secondary | ICD-10-CM

## 2012-07-16 DIAGNOSIS — Z9049 Acquired absence of other specified parts of digestive tract: Secondary | ICD-10-CM

## 2012-07-16 DIAGNOSIS — R1031 Right lower quadrant pain: Secondary | ICD-10-CM

## 2012-07-16 DIAGNOSIS — Z9889 Other specified postprocedural states: Secondary | ICD-10-CM

## 2012-07-16 DIAGNOSIS — G8929 Other chronic pain: Secondary | ICD-10-CM

## 2012-07-16 HISTORY — DX: Hemangioma of intra-abdominal structures: D18.03

## 2012-07-16 NOTE — Progress Notes (Signed)
Subjective:    Patient ID: Tina Hardy, female    DOB: 10-21-1940, 72 y.o.   MRN: [id number]  HPI Tina Hardy is a pleasant 72 year old white female known to Dr. [name] who was last seen in November of 2013. She has history of colon polyps, diverticular disease a hepatic hemangioma, pernicious anemia, and underwent an appendectomy in 2012. When she was seen in the fall she had been complaining of daily. Umbilical abdominal pain which had been bothering her for months. She had an upper abdominal ultrasound which confirmed cholelithiasis and she was scheduled to see surgery. She underwent laparoscopic cholecystectomy with Dr. Andrey Campanile on 05/30/2012 as well as an IOC which was negative She was diagnosed with chronic cholecystitis and cholelithiasis . She has done well postoperatively however is now complaining of continuous very low pelvic discomfort which she says is also been bothering her for months She is to stressed because she has been unable to resume exercise because of her low abdominal discomfort which seems to be exacerbated by long walks and working out in the gym. Her appetite has been fine her weight has been stable she is not having any problems with nausea, no changes in her bowel habits melena or hematochezia. She does not notice any change her improvement in her symptoms after a bowel movement. She has noticed some increased urinary frequency but no dysuria. She also says she feels as if her bladder gets full more quickly than it use to. She describes a heavy pulling feeling in her pelvis which is fairly constant. She has not had any recent GYN evaluation She did have CT scan of the abdomen and pelvis done on 07/07/2012 which showed scattered diverticulosis no diverticulitis. She has a stable right lobe of the liver hemangioma measuring  3.7 by 4.6 cm and other low-attenuation structures in the liver consistent with cysts. Uterus and ovaries appeared normal and no recurrent inguinal  hernia.    Review of Systems  Constitutional: Positive for activity change.  HENT: Negative.   Eyes: Negative.   Respiratory: Negative.   Gastrointestinal: Negative.   Endocrine: Negative.   Genitourinary: Positive for frequency and pelvic pain.  Skin: Negative.   Allergic/Immunologic: Negative.   Neurological: Negative.   Hematological: Negative.   Psychiatric/Behavioral: The patient is nervous/anxious.    Outpatient Prescriptions Prior to Visit  Medication Sig Dispense Refill  . BIOTIN PO Take 1 tablet by mouth daily.      Marland Kitchen HYDROcodone-acetaminophen (NORCO/VICODIN) 5-325 MG per tablet Take 1-2 tablets by mouth every 6 (six) hours as needed for pain.  40 tablet  0  . MAGNESIUM PO Take 1 tablet by mouth daily.      . Multiple Vitamin (MULTIVITAMIN) tablet Take 1 tablet by mouth daily.       No facility-administered medications prior to visit.   Allergies  Allergen Reactions  . Codeine     REACTION: questionable  . Penicillins     REACTION: questionable   Patient Active Problem List  Diagnosis  . HEMANGIOMA, HEPATIC  . DIVERTICULOSIS OF COLON  . HEMOCCULT POSITIVE STOOL  . COLONIC POLYPS, HYPERPLASTIC, HX OF  . Hepatic hemangioma  . S/P laparoscopic cholecystectomy  . S/P laparoscopic appendectomy   History  Substance Use Topics  . Smoking status: Former Smoker    Quit date: 12/04/1972  . Smokeless tobacco: Never Used  . Alcohol Use: Yes     Comment: occasional       Objective:   Physical Exam well-developed older white female  in no acute distress, pleasant blood pressure 150/80 pulse 80 height 5 foot 5 weight 156. HEENT; nontraumatic normocephalic EOMI PERRLA sclera anicteric,Neck; Supple no JVD, Cardiovascular; regular rate and rhythm with S1-S2 no murmur or gallop, Pulmonary; clear bilaterally, Abdomen; soft nontender nondistended bowel sounds are active there is no palpable mass or hepatosplenomegaly she has a healing incisional ports from recent  laparoscopic cholecystectomy, Rectal; exam not done, Extremities no clubbing cyanosis or edema skin warm and dry, Psych; mood and affect normal and appropriate        Assessment & Plan:  #70 72 year old female stable status post laparoscopic cholecystectomy done in January 2014 #2 several month history of low pelvic discomfort described as heaviness and pulling with recent negative CT of the abdomen and pelvis. I do not think her pelvic pain his GI in origin. I suspect her symptoms are secondary to pelvic floor relaxation with descent of the bladder and/or uterus. #3 diverticulosis #4 status post appendectomy and previous inguinal hernia repair #5 hepatic hemangioma stable  Plan; will not pursue further GI workup at this time, she will obtain an appointment with her previous gynecologist. Dr. Jackelyn Knife  If urologic evaluation is indicated we will be happy to help with that referral.

## 2012-07-16 NOTE — Progress Notes (Signed)
Reviewed and agree. She has had an adequate GI eval.

## 2012-07-16 NOTE — Patient Instructions (Addendum)
Make an appointment with Dr. Lavina Hamman at [phone]. He is with [facility].  His office is next door to our building on the left.

## 2012-10-14 ENCOUNTER — Telehealth: Payer: Self-pay | Admitting: Internal Medicine

## 2012-10-14 NOTE — Telephone Encounter (Signed)
Left message for patient to call back  

## 2012-10-15 NOTE — Telephone Encounter (Signed)
Spoke with patient and scheduled her on 10/17/12 at 2:15 PM

## 2012-10-17 ENCOUNTER — Ambulatory Visit (INDEPENDENT_AMBULATORY_CARE_PROVIDER_SITE_OTHER): Payer: Medicare Other | Admitting: Internal Medicine

## 2012-10-17 ENCOUNTER — Encounter: Payer: Self-pay | Admitting: Internal Medicine

## 2012-10-17 ENCOUNTER — Other Ambulatory Visit (INDEPENDENT_AMBULATORY_CARE_PROVIDER_SITE_OTHER): Payer: Medicare Other

## 2012-10-17 VITALS — BP 138/80 | HR 72 | Ht 65.5 in | Wt 160.5 lb

## 2012-10-17 DIAGNOSIS — R110 Nausea: Secondary | ICD-10-CM

## 2012-10-17 DIAGNOSIS — K589 Irritable bowel syndrome without diarrhea: Secondary | ICD-10-CM

## 2012-10-17 DIAGNOSIS — R142 Eructation: Secondary | ICD-10-CM

## 2012-10-17 DIAGNOSIS — R140 Abdominal distension (gaseous): Secondary | ICD-10-CM

## 2012-10-17 DIAGNOSIS — R141 Gas pain: Secondary | ICD-10-CM

## 2012-10-17 LAB — IGA: IgA: 330 mg/dL (ref 68–378)

## 2012-10-17 NOTE — Patient Instructions (Addendum)
Your physician has requested that you go to the basement for  lab work before leaving today. Please start taking the probiotic as directed.   CC:  Geoffry Paradise MD

## 2012-10-17 NOTE — Progress Notes (Signed)
Tina Hardy [birth year] MRN [id number]        History of Present Illness:  This is a 72 year old white female with irritable bowel syndrome status post recent laparoscopic cholecystectomy for cholelithiasis in January 2014. She has been experiencing low back pain and rectal fullness. The main complaint today is bloating. She has been using health food store apple cider vinegar 2 tablespoons 3 times a day with good results. She just started taking it 2 days ago and would like to continue on it before switching to another medication. She had a colonoscopy in 2006 showing hyperplastic polyp and most recently in February 2010  was a normal exam. She has a stable right lobe of the liver hemangioma. Her bowel habits are regular. She denies rectal bleeding or constipation. She was offered Nexium but did not want to take it, she prefers natural  And herbal preparations.   Past Medical History  Diagnosis Date  . Liver hemangioma   . Gallstones   . Diverticulosis   . Colon polyp   . Pernicious anemia    Past Surgical History  Procedure Laterality Date  . Appendectomy  2012  . Umbilical hernia repair  12/24/06    with reduction of sigmoid colon which was incarcerated  . Knee surgery      right  . Inguinal hernia repair  12/24/06    left; laparoscopic  . Cholecystectomy  05/30/2012    Procedure: LAPAROSCOPIC CHOLECYSTECTOMY WITH INTRAOPERATIVE CHOLANGIOGRAM;  Surgeon: Atilano Ina, MD,FACS;  Location: [facility] OR;  Service: General;  Laterality: N/A;    reports that she quit smoking about 39 years ago. She has never used smokeless tobacco. She reports that  drinks alcohol. She reports that she does not use illicit drugs. family history includes Crohn's disease in her others.  There is no history of Colon cancer. Allergies  Allergen Reactions  . Codeine     REACTION: questionable  . Penicillins     REACTION: questionable        Review of Systems: Denies heartburn. Positive for nausea  The  remainder of the 10 point ROS is negative except as outlined in H&P   Physical Exam: General appearance  Well developed, in no distress. Eyes- non icteric. HEENT nontraumatic, normocephalic. Mouth no lesions, tongue papillated, no cheilosis. Neck supple without adenopathy, thyroid not enlarged, no carotid bruits, no JVD. Lungs Clear to auscultation bilaterally. Cor normal S1, normal S2, regular rhythm, no murmur,  quiet precordium. Abdomen: Soft nontender with normoactive bowel sounds. No tympany. No distention. Post laparoscopic cholecystectomy marks in upper abdomen Rectal: Not done Extremities no pedal edema. Skin no lesions. Neurological alert and oriented x 3. Psychological normal mood and affect.  Assessment and Plan:  Bloating and nausea suggestive of irritable bowel syndrome. Patient is inclined to follow only homeopathic medications rather than prescription medications. Her low back pain and rectal fullness may be reflexion of pelvic floor relaxation. The bloating may be related to either irritable bowel syndrome or bacterial overgrowth. She will continue on  Apple cider vinegar for next 4 weeks per her suggestion and  if there is no improvement she will try probiotic for 1 months.  I have given her samples of probiotics. She will return when necessary we are checking her sprue profile today as well as IgA.She is up to date on the colonoscopy. Consider EGD.   10/17/2012 Tina Hardy

## 2012-10-20 LAB — CELIAC PANEL 10
Gliadin IgA: 6.6 U/mL (ref <20)
Tissue Transglut Ab: 14.2 U/mL (ref <20)
Tissue Transglutaminase Ab, IgA: 7.8 U/mL (ref <20)

## 2013-01-02 ENCOUNTER — Telehealth: Payer: Self-pay | Admitting: Internal Medicine

## 2013-01-02 NOTE — Telephone Encounter (Signed)
Spoke with patient and she is having some bright, red blood on the tissue when she wipes. This is new for her. Also, report she has had stomach pain below the belly button for "a long time, probably January." Offered to schedule with extender. She wants to schedule with Dr. Juanda Chance and if she needs to come earlier she will call back. Scheduled on 01/13/13 at 2:30 PM.

## 2013-01-05 ENCOUNTER — Encounter: Payer: Self-pay | Admitting: *Deleted

## 2013-01-05 ENCOUNTER — Telehealth: Payer: Self-pay | Admitting: Internal Medicine

## 2013-01-05 ENCOUNTER — Ambulatory Visit (INDEPENDENT_AMBULATORY_CARE_PROVIDER_SITE_OTHER): Payer: Medicare Other | Admitting: Gastroenterology

## 2013-01-05 VITALS — BP 118/62 | HR 72 | Ht 65.5 in | Wt 160.8 lb

## 2013-01-05 DIAGNOSIS — R140 Abdominal distension (gaseous): Secondary | ICD-10-CM

## 2013-01-05 DIAGNOSIS — K625 Hemorrhage of anus and rectum: Secondary | ICD-10-CM | POA: Insufficient documentation

## 2013-01-05 DIAGNOSIS — R141 Gas pain: Secondary | ICD-10-CM

## 2013-01-05 MED ORDER — RIFAXIMIN 550 MG PO TABS: 550.0000 mg | ORAL_TABLET | Freq: Two times a day (BID) | ORAL | Status: DC

## 2013-01-05 MED ORDER — HYDROCORTISONE ACETATE 25 MG RE SUPP: 25.0000 mg | Freq: Two times a day (BID) | RECTAL | Status: DC

## 2013-01-05 MED ORDER — METRONIDAZOLE 500 MG PO TABS: ORAL_TABLET | ORAL | Status: DC

## 2013-01-05 NOTE — Telephone Encounter (Signed)
Unable to afford medication ordered today ($500). Also, the medication she takes is Plexus probio5 and cleanse 2-3 tabs in AM and 2-3 tabs in PM.

## 2013-01-05 NOTE — Telephone Encounter (Signed)
Patient notified of Shanda Bumps Zehr's recommendation and told no ETOH while taking Flagyl. Rx sent.

## 2013-01-05 NOTE — Patient Instructions (Addendum)
We sent a prescription for xifaxan, Take 1 tab twice daily for 10 days. [facility], [address]. We also sent a prescription for Anusol HC Suppositories to [facility].  Call our office at [phone] and ask for [facility], to let her know the name of the medication you are taking.

## 2013-01-05 NOTE — Telephone Encounter (Signed)
Let's try flagyl instead with 500 mg BID x 10 days if she is willing to try it.  No ETOH while taking it!  Thank you,  Jess

## 2013-01-05 NOTE — Progress Notes (Signed)
Reviewed and agree. Would SSRI reail be helpful. Zoloft 25 mg qd

## 2013-01-05 NOTE — Progress Notes (Signed)
01/05/2013 Tina Hardy [id number] 03/13/1941   History of Present Illness:  This is a 72 year old white female who is a patient of Dr. Regino Schultze.  She has history of irritable bowel syndrome status post recent laparoscopic cholecystectomy for cholelithiasis in January 2014. She comes in to the office today with complaints rectal fullness and rectal bleeding with BM's along with abdominal bloating and nausea.  She was seen for these same complaints by Dr. Juanda Chance in May of this year; the only new symptom is the rectal bleeding with BM's, which started about 2-3 weeks ago.  It was somewhat difficult to keep her focused, but her main issued seemed to be the bloating.  She has been using homeopathic treatments, initially health food store apple cider vinegar 2 tablespoons 3 times a day with good results, but stopped that is is now taking something different (she cannot remember what it is called, but is going to call back with the name).  It was suggested that she take probiotics, which she states that she tried without relief.  She had a colonoscopy in 2006 showing hyperplastic polyp and most recently in February 2010 was a normal exam. She has a stable right lobe of the liver hemangioma. Her bowel habits are regular, but more soft since her cholecystectomy.  She says that if she does not get some resolution of her bloating then she is going to go to [facility] for evaluation.    CT scan in February of this year was unremarkable.  I mentioned to her about an EGD, which was suggested in Dr. Regino Schultze A&P from her previous visit, but she declined at this time; she said that she did not know what it would show regarding her symptoms.  Celiac testing was negative at last visit.   Current Medications, Allergies, Past Medical History, Past Surgical History, Family History and Social History were reviewed in [employer] record.   Physical Exam: BP 118/62  Pulse 72  Ht 5' 5.5" (1.664 m)  Wt  160 lb 12.8 oz (72.938 kg)  BMI 26.34 kg/m2 General: Well developed, white female in no acute distress Head: Normocephalic and atraumatic Eyes:  sclerae anicteric, conjunctiva pink  Ears: Normal auditory acuity Lungs: Clear throughout to auscultation Heart: Regular rate and rhythm Abdomen: Soft, non-distended. No masses, no hepatomegaly. Normal bowel sounds.  Mild diffuse TTP, but abdomen benign. Musculoskeletal: Symmetrical with no gross deformities  Extremities: No edema  Neurological: Alert oriented x 4, grossly nonfocal Psychological:  Alert and cooperative. Normal mood and affect  Assessment and Recommendations: -Rectal bleeding and discomfort:  Very likely from hemorrhoids.  Will try anusol suppositories twice daily for two weeks.  She is up to date on her colonoscopy. -Bloating and nausea:  Contributed to IBS/SIBO.  She has tried probiotics without success.  Will try a 10 day course of Xifaxan 550 mg BID.  *Follow-up in 6 weeks.

## 2013-01-05 NOTE — Telephone Encounter (Signed)
Spoke with patient and she states she has had abdominal pain all weekend and wants to be seen today. Scheduled at 10:00 AM with Doug Sou, PA.

## 2013-01-09 ENCOUNTER — Encounter: Payer: Self-pay | Admitting: Gastroenterology

## 2013-01-09 ENCOUNTER — Other Ambulatory Visit (INDEPENDENT_AMBULATORY_CARE_PROVIDER_SITE_OTHER): Payer: Medicare Other

## 2013-01-09 ENCOUNTER — Telehealth: Payer: Self-pay | Admitting: Internal Medicine

## 2013-01-09 ENCOUNTER — Ambulatory Visit (INDEPENDENT_AMBULATORY_CARE_PROVIDER_SITE_OTHER): Payer: Medicare Other | Admitting: Gastroenterology

## 2013-01-09 ENCOUNTER — Other Ambulatory Visit: Payer: Self-pay | Admitting: Gastroenterology

## 2013-01-09 VITALS — BP 138/70 | HR 73 | Ht 65.5 in | Wt 159.0 lb

## 2013-01-09 DIAGNOSIS — K625 Hemorrhage of anus and rectum: Secondary | ICD-10-CM

## 2013-01-09 LAB — CBC WITH DIFFERENTIAL/PLATELET
Basophils Absolute: 0 10*3/uL (ref 0.0–0.1)
Eosinophils Absolute: 0.1 10*3/uL (ref 0.0–0.7)
Hemoglobin: 13.4 g/dL (ref 12.0–15.0)
Lymphocytes Relative: 19 % (ref 12.0–46.0)
Monocytes Relative: 14 % — ABNORMAL HIGH (ref 3.0–12.0)
Neutro Abs: 5 10*3/uL (ref 1.4–7.7)
Neutrophils Relative %: 64.9 % (ref 43.0–77.0)
Platelets: 420 10*3/uL — ABNORMAL HIGH (ref 150.0–400.0)
RDW: 13.3 % (ref 11.5–14.6)

## 2013-01-09 MED ORDER — HYOSCYAMINE SULFATE 0.125 MG SL SUBL: 0.1250 mg | SUBLINGUAL_TABLET | SUBLINGUAL | Status: DC | PRN

## 2013-01-09 NOTE — Telephone Encounter (Signed)
Pt will go to the lab prior to her visit. Pt scheduled to see Doug Sou PA today at 1:30pm. Pt aware of appt.

## 2013-01-09 NOTE — Telephone Encounter (Signed)
Needs to see Shanda Bumps for rectal exam ? Melena Have her do a CBC stat before she sees her  I have talked to [facility]

## 2013-01-09 NOTE — Progress Notes (Signed)
01/09/2013 Tina Hardy [id number] 1941/04/21   History of Present Illness:  This is patient of Dr. Regino Schultze who was seen by me earlier this week for several complaints, mostly bloating and abdominal discomfort, but also some rectal fullness and bleeding.  She was given suppositories to use for empiric treatment of hemorrhoids and was given a course of flagyl to treat for IBS/SIBO.  She had a colonoscopy in 2006 showing hyperplastic polyp and most recently in February 2010 was a normal exam.  CT scan in February of this year was unremarkable.  Celiac testing was negative at last visit.  She called today saying that she has been passing dark colored clots since she was seen by me earlier this week.  She says that she is using the other medications as directed.  We had her return today to check CBC and perform rectal exam to ensure no melena was present.   Current Medications, Allergies, Past Medical History, Past Surgical History, Family History and Social History were reviewed in [employer] record.   Physical Exam: BP 138/70  Pulse 73  Ht 5' 5.5" (1.664 m)  Wt 159 lb (72.122 kg)  BMI 26.05 kg/m2  SpO2 98% General: Well developed, white female in no acute distress; she is laughing and joking during her visit. Head: Normocephalic and atraumatic Eyes:  sclerae anicteric, conjunctiva pink  Ears: Normal auditory acuity Rectal:  Normal external exam.  DRE did not reveal an palpable internal hemorrhoids. Non-tender.  There was a small amount of light red colored blood on the exam glove and she was strongly heme positive. Musculoskeletal: Symmetrical with no gross deformities  Extremities: No edema  Neurological: Alert oriented x 4, grossly nonfocal Psychological:  Alert and cooperative. Normal mood and affect  Assessment and Recommendations: -Rectal bleeding:  Hgb normal.  I still believe that is likely secondary to internal hemorrhoids or possibly a diverticular  bleed.  There was no melena on today's exam.  Hopefully this will be a self limited process and will subside without intervention.  If the bleeding continues or worsens throughout the weekend then she needs to be assessed in the ED.  She will continue the suppositories and the flagyl as she is currently taking it.  I will also had levsin 0.125 mg every 6 hours prn to her regimen for the abdominal discomfort.

## 2013-01-09 NOTE — Telephone Encounter (Signed)
Tina Hardy pt that was seen by Doug Sou PA Monday. Pt was having rectal bleeding and stomach cramping. She was given Xifaxan and Anusol supp. Pt states she has been in the bed since Monday, states she is very weak. States her bleeding is worse and now she has black dark clots coming out. Dr. Leone Payor as doc of the day please advise.

## 2013-01-13 ENCOUNTER — Encounter: Payer: Self-pay | Admitting: Nurse Practitioner

## 2013-01-13 ENCOUNTER — Ambulatory Visit: Payer: Medicare Other | Admitting: Internal Medicine

## 2013-01-13 ENCOUNTER — Ambulatory Visit (INDEPENDENT_AMBULATORY_CARE_PROVIDER_SITE_OTHER): Payer: Medicare Other | Admitting: Nurse Practitioner

## 2013-01-13 VITALS — BP 130/68 | HR 74 | Ht 64.0 in | Wt 156.0 lb

## 2013-01-13 DIAGNOSIS — R194 Change in bowel habit: Secondary | ICD-10-CM

## 2013-01-13 DIAGNOSIS — R109 Unspecified abdominal pain: Secondary | ICD-10-CM

## 2013-01-13 DIAGNOSIS — K625 Hemorrhage of anus and rectum: Secondary | ICD-10-CM

## 2013-01-13 DIAGNOSIS — R198 Other specified symptoms and signs involving the digestive system and abdomen: Secondary | ICD-10-CM

## 2013-01-13 MED ORDER — COLESEVELAM HCL 625 MG PO TABS: 1875.0000 mg | ORAL_TABLET | Freq: Every day | ORAL | Status: DC

## 2013-01-13 MED ORDER — DIPHENOXYLATE-ATROPINE 2.5-0.025 MG PO TABS: 1.0000 | ORAL_TABLET | Freq: Four times a day (QID) | ORAL | Status: DC | PRN

## 2013-01-13 MED ORDER — HYDROCORTISONE ACETATE 25 MG RE SUPP: 25.0000 mg | Freq: Two times a day (BID) | RECTAL | Status: DC

## 2013-01-13 NOTE — Progress Notes (Signed)
  History of Present Illness:  Patient is a 72 year old female known to Dr. Juanda Chance for a history of irritable bowel syndrome,colon polyps, and diverticular disease. Patient was evaluated for abdominal pain November 2013. Workup revealed gallstones, she was subsequently referred to surgery and underwent lap chole January of this year. Gallbladder path was c/w chronic cholecystitis and cholelithiasis. Patient returned here in late February for evaluation of a several month history of pelvic discomfort / pressure. Symptoms not felt to be GI in nature. She returned late May with bloating, which we felt was IBS or bacterial overgrowth and rectal fullness felt to be pelvic floor relaxation . Celiac studies done and negative. Patient was tried on probiotics for bloating but returned 01/05/13 with ongoing bloating, rectal bleeding and rectal fullness. Bleeding felt to be hemorrhoidal in nature, Anusol suppositories twice daily prescribed for 2 weeks. She was prescribed Xifaxan for bloating. The medication was too expensive, Flagyl was called in instead. Patient comes back today with complaints of persistent bleeding. She does not want to eat as it leads to bowel movements. She describes postprandial passage of unformed stools since cholecystectomy. The bleeding began approximately one month ago. She describes mid abdominal discomfort and pressure in her pelvic area, feels like a brick is sitting in pelvic area. .   Current Medications, Allergies, Past Medical History, Past Surgical History, Family History and Social History were reviewed in [employer] record.  Physical Exam: General: Well developed , white female in no acute distress Head: Normocephalic and atraumatic Eyes:  sclerae anicteric, conjunctiva pink  Ears: Normal auditory acuity Lungs: Clear throughout to auscultation Heart: Regular rate and rhythm Abdomen: Soft, non distended, non-tender. No masses, no hepatomegaly. Normal  bowel sounds Rectal: no external lesions. On anoscopy there were inflamed, friable internal hemorrhoids present.  Musculoskeletal: Symmetrical with no gross deformities  Extremities: No edema  Neurological: Alert oriented x 4, grossly nonfocal Psychological:  Alert and cooperative. Normal mood and affect  Assessment and Recommendations: 30. 72 year old female with chronic abdominal pain (almost a year), bowel changes since cholecystectomy and relatively new rectal bleeding. Etiology of abdominal pain not clear but patient assures me it isn't new. CTscan of abdomen / pelvis in Feb 2014 was unremarkable and her abdominal exam is not overly concerning. Pain could be IBS related. Though stools are not necessarily watery, bile acid malabsorption should still be considered. Will try Welchol, a bile acid binder, in addition to Lomotil. Suspect bleeding related to the internal hemorrhoids seen on anoscopy today, see #2. Patient will call us in 7-10 days with a condition update.  If no improvement in abdominal pain, bowel changes, and rectal bleeding with above interventions then she may need repeat colonoscopy to rule out colitis or other colon pathology.    2. Rectal bleeding.  Suspect frequent defecation aggravating hemorrhoids. Hopefully as BMs improve, hemorrhoids will as well. Will refill Anusol HC suppository prescription.   3. Pelvic pain, chronic. She does complain of pressure in rectum which could be hemorrhoid related.

## 2013-01-13 NOTE — Progress Notes (Signed)
Agree with Ms. Zehr's management. 

## 2013-01-13 NOTE — Patient Instructions (Addendum)
We have sent the following medications to your pharmacy for you to pick up at your convenience: Welchol, Lomotil, and refilled Anusol  Follow up with Dr. Juanda Chance  Call us in 10 days to let us know how you are doing                                               We are excited to introduce MyChart, a new best-in-class service that provides you online access to important information in your electronic medical record. We want to make it easier for you to view your health information - all in one secure location - when and where you need it. We expect MyChart will enhance the quality of care and service we provide.  When you register for MyChart, you can:    View your test results.    Request appointments and receive appointment reminders via email.    Request medication renewals.    View your medical history, allergies, medications and immunizations.    Communicate with your physician's office through a password-protected site.    Conveniently print information such as your medication lists.  To find out if MyChart is right for you, please talk to a member of our clinical staff today. We will gladly answer your questions about this free health and wellness tool.  If you are age 70 or older and want a member of your family to have access to your record, you must provide written consent by completing a proxy form available at our office. Please speak to our clinical staff about guidelines regarding accounts for patients younger than age 62.  As you activate your MyChart account and need any technical assistance, please call the MyChart technical support line at [phone]) [phone]-CHART [phone]) or email your question to [email or web address]. If you email your question(s), please include your name, a return phone number and the best time to reach you.  If you have non-urgent health-related questions, you can send a message to our office through MyChart at [email or web address]. If you have a  medical emergency, call 911.  Thank you for using MyChart as your new health and wellness resource!   MyChart licensed from [employer],  [id number]. Patents Pending.

## 2013-01-21 ENCOUNTER — Telehealth: Payer: Self-pay | Admitting: Internal Medicine

## 2013-01-21 NOTE — Telephone Encounter (Signed)
Spoke with patient and she states she is not bleeding and is not in pain. She is doing better now. States "I am out in my car." She has used all of her Lomotil and Levsin tablets. She will call for refill if she feels she needs them. Wanted to let Willette Cluster, NP know how she was doing.

## 2013-01-22 ENCOUNTER — Other Ambulatory Visit: Payer: Self-pay | Admitting: *Deleted

## 2013-01-22 ENCOUNTER — Telehealth: Payer: Self-pay | Admitting: *Deleted

## 2013-01-22 ENCOUNTER — Telehealth: Payer: Self-pay | Admitting: Internal Medicine

## 2013-01-22 DIAGNOSIS — R109 Unspecified abdominal pain: Secondary | ICD-10-CM

## 2013-01-22 DIAGNOSIS — K625 Hemorrhage of anus and rectum: Secondary | ICD-10-CM

## 2013-01-22 MED ORDER — DIPHENOXYLATE-ATROPINE 2.5-0.025 MG PO TABS: 1.0000 | ORAL_TABLET | Freq: Four times a day (QID) | ORAL | Status: DC | PRN

## 2013-01-22 MED ORDER — HYDROCORTISONE ACETATE 25 MG RE SUPP: 25.0000 mg | Freq: Two times a day (BID) | RECTAL | Status: DC

## 2013-01-22 MED ORDER — HYOSCYAMINE SULFATE 0.125 MG SL SUBL: 0.1250 mg | SUBLINGUAL_TABLET | SUBLINGUAL | Status: DC | PRN

## 2013-01-22 NOTE — Telephone Encounter (Signed)
[name] husband came to office and wants all of her medications sent to [facility] for refills.(Levsin, Lomotil and Anusol supp.)

## 2013-01-22 NOTE — Telephone Encounter (Signed)
Spoke with patient and her husband. Patient states she started having bleeding from her hemorrhoids last night .No diarrhea. She has not used her suppository. She also reports some stomach pain again. She will use her suppositories. She will also take Levsin. Spoke with her husband and he wanted to know what type of foods are bland foods because patient is eating "all the wrong things." Reviewed bland foods with patient's husband.

## 2013-01-23 ENCOUNTER — Other Ambulatory Visit: Payer: Self-pay | Admitting: *Deleted

## 2013-01-23 MED ORDER — HYOSCYAMINE SULFATE 0.125 MG SL SUBL: 0.1250 mg | SUBLINGUAL_TABLET | SUBLINGUAL | Status: DC | PRN

## 2013-01-29 ENCOUNTER — Telehealth: Payer: Self-pay | Admitting: Internal Medicine

## 2013-01-29 NOTE — Telephone Encounter (Signed)
Patient calling because she wants pain medication. She states "I have been in the bed for a month." States her bleeding has stopped but she still has pain. She states "nobody from your office has called to check on me." Patient angry that she still feels bad. States she cannot go to the gym and has only been out of the house twice in a month. Please, advise.

## 2013-01-29 NOTE — Telephone Encounter (Signed)
I have called the pt and talked to her AT LENGTH- she is having a good day!! Really, nothing is going on, nothing specific. She was happy to connect. I suggested nutrition consult and she is going to think about it. I suggested colonoscopy ( 4 years ago), but she wants to wait and see. I encouraged her to start going to the gym ( no reason not to). So , all settled.

## 2013-02-04 ENCOUNTER — Ambulatory Visit (INDEPENDENT_AMBULATORY_CARE_PROVIDER_SITE_OTHER): Payer: Medicare Other | Admitting: Surgery

## 2013-02-04 ENCOUNTER — Encounter (INDEPENDENT_AMBULATORY_CARE_PROVIDER_SITE_OTHER): Payer: Self-pay | Admitting: Surgery

## 2013-02-04 VITALS — BP 128/78 | HR 84 | Temp 98.7°F | Resp 14 | Ht 65.0 in | Wt 154.2 lb

## 2013-02-04 DIAGNOSIS — G8929 Other chronic pain: Secondary | ICD-10-CM

## 2013-02-04 DIAGNOSIS — F439 Reaction to severe stress, unspecified: Secondary | ICD-10-CM | POA: Insufficient documentation

## 2013-02-04 DIAGNOSIS — R109 Unspecified abdominal pain: Secondary | ICD-10-CM

## 2013-02-04 DIAGNOSIS — K625 Hemorrhage of anus and rectum: Secondary | ICD-10-CM

## 2013-02-04 NOTE — Patient Instructions (Addendum)
Consider switching to a different fiber supplement.  Try flaxseed.  Goal is one soft bowel movement a day.  Resume exercise gradually over the next month.  If you are having persistent bleeding around the anus, consider colonoscopy.  Bleeding from hemorrhoids usually resolves within two months.  HEMORRHOIDS  The rectum is the last foot of your colon, and it naturally stretches to hold stool.  Hemorrhoidal piles are natural clusters of blood vessels that help the rectum and anal canal stretch to hold stool and allow bowel movements to eliminate feces.   Hemorrhoids are abnormally swollen blood vessels in the rectum.  Too much pressure in the rectum causes hemorrhoids by forcing blood to stretch and bulge the walls of the veins, sometimes even rupturing them.  Hemorrhoids can become like varicose veins you might see on a person's legs.  Most people will develop a flare of hemorrhoids in their lifetime.  When bulging hemorrhoidal veins are irritated, they can swell, burn, itch, cause pain, and bleed.  Most flares will calm down gradually own within a few weeks.  However, once hemorrhoids are created, they are difficult to get rid of completely and tend to flare more easily than the first flare.   Fortunately, good habits and simple medical treatment usually control hemorrhoids well, and surgery is needed only in severe cases. Types of Hemorrhoids:  Internal hemorrhoids usually don't initially hurt or itch; they are deep inside the rectum and usually have no sensation. If they begin to push out (prolapse), pain and burning can occur.  However, internal hemorrhoids can bleed.  Anal bleeding should not be ignored since bleeding could come from a dangerous source like colorectal cancer, so persistent rectal bleeding should be investigated by a doctor, sometimes with a colonoscopy.  External hemorrhoids cause most of the symptoms - pain, burning, and itching. Nonirritated hemorrhoids can look like small skin  tags coming out of the anus.   Thrombosed hemorrhoids can form when a hemorrhoid blood vessel bursts and causes the hemorrhoid to suddenly swell.  A purple blood clot can form in it and become an excruciatingly painful lump at the anus. Because of these unpleasant symptoms, immediate incision and drainage by a surgeon at an office visit can provide much relief of the pain.    PREVENTION Avoiding the most frequent causes listed below will prevent most cases of hemorrhoids: Constipation Hard stools Diarrhea  Constant sitting  Straining with bowel movements Sitting on the toilet for a long time  Severe coughing  episodes Pregnancy / Childbirth  Heavy Lifting  Sometimes avoiding the above triggers is difficult:  How can you avoid sitting all day if you have a seated job? Also, we try to avoid coughing and diarrhea, but sometimes it's beyond your control.  Still, there are some practical hints to help: Keep the anal and genital area clean.  Moistened tissues such as flushable wet wipes are less irritating than toilet paper.  Using irrigating showers or bottle irrigation washing gently cleans this sensitive area.   Avoid dry toilet paper when cleaning after bowel movements.  Marland Kitchen Keep the anal and genital area dry.  Lightly pat the rectal area dry.  Avoid rubbing.  Talcum or baby powders can help GET YOUR STOOLS SOFT.   This is the most important way to prevent irritated hemorrhoids.  Hard stools are like sandpaper to the anorectal canal and will cause more problems.  The goal: ONE SOFT BOWEL MOVEMENT A DAY!  BMs from every other day to  3 times a day is a tolerable range Treat coughing, diarrhea and constipation early since irritated hemorrhoids may soon follow.  If your main job activity is seated, always stand or walk during your breaks. Make it a point to stand and walk at least 5 minutes every hour and try to shift frequently in your chair to avoid direct rectal pressure.  Always exhale as you strain  or lift. Don't hold your breath.  Do not delay or try to prevent a bowel movement when the urge is present. Exercise regularly (walking or jogging 60 minutes a day) to stimulate the bowels to move. No reading or other activity while on the toilet. If bowel movements take longer than 5 minutes, you are too constipated. AVOID CONSTIPATION Drink plenty of liquids (1 1/2 to 2 quarts of water and other fluids a day unless fluid restricted for another medical condition). Liquids that contain caffeine (coffee a, tea, soft drinks) can be dehydrating and should be avoided until constipation is controlled. Consider minimizing milk, as dairy products may be constipating. Eat plenty of fiber (30g a day ideal, more if needed).  Fiber is the undigested part of plant food that passes into the colon, acting as "natures broom" to encourage bowel motility and movement.  Fiber can absorb and hold large amounts of water. This results in a larger, bulkier stool, which is soft and easier to pass.  Eating foods high in fiber - 12 servings - such as  Vegetables: Root (potatoes, carrots, turnips), Leafy green (lettuce, salad greens, celery, spinach), High residue (cabbage, broccoli, etc.) Fruit: Fresh, Dried (prunes, apricots, cherries), Stewed (applesauce)  Whole grain breads, pasta, whole wheat Bran cereals, muffins, etc. Consider adding supplemental bulking fiber which retains large volumes of water: Psyllium ground seeds (native plant from [place])--available as Metamucil, Konsyl, Effersyllium, Per Diem Fiber, or the less expensive generic forms.  Citrucel  (methylcellulose wood fiber) . FiberCon (Polycarbophil) Polyethylene Glycol - and "artificial" fiber commonly called Miralax or Glycolax.  It is helpful for people with gassy or bloated feelings with regular fiber Flax Seed - a less gassy natural fiber  Laxatives can be useful for a short period if constipation is severe Osmotics (Milk of Magnesia, Fleets  Phospho-Soda, Magnesium Citrate)  Stimulants (Senokot,   Castor Oil,  Dulcolax, Ex-Lax)    Laxatives are not a good long-term solution as it can stress the bowels and cause too much mineral loss and dehydration.   Avoid taking laxatives for more than 7 days in a row.  AVOID DIARRHEA Switch to liquids and simpler foods for a few days to avoid stressing your intestines further. Avoid dairy products (especially milk & ice cream) for a short time.  The intestines often can lose the ability to digest lactose when stressed. Avoid foods that cause gassiness or bloating.  Typical foods include beans and other legumes, cabbage, broccoli, and dairy foods.  Every person has some sensitivity to other foods, so listen to your body and avoid those foods that trigger problems for you. Adding fiber (Citrucel, Metamucil, FiberCon, Flax seed, Miralax) gradually can help thicken stools by absorbing excess fluid and retrain the intestines to act more normally.  Slowly increase the dose over a few weeks.  Too much fiber too soon can backfire and cause cramping & bloating. Probiotics (such as active yogurt, Align, etc) may help repopulate the intestines and colon with normal bacteria and calm down a sensitive digestive tract.  Most studies show it to be of mild help,  though, and such products can be costly. Medicines: Bismuth subsalicylate (ex. Kayopectate, Pepto Bismol) every 30 minutes for up to 6 doses can help control diarrhea.  Avoid if pregnant. Loperamide (Immodium) can slow down diarrhea.  Start with two tablets (4mg  total) first and then try one tablet every 6 hours.  Avoid if you are having fevers or severe pain.  If you are not better or start feeling worse, stop all medicines and call your doctor for advice Call your doctor if you are getting worse or not better.  Sometimes further testing (cultures, endoscopy, X-ray studies, bloodwork, etc) may be needed to help diagnose and treat the cause of the  diarrhea. TREATMENT OF HEMORRHOID FLARE If these preventive measures fail, you must take action right away! Hemorrhoids are one condition that can be mild in the morning and become intolerable by nightfall. Most hemorrhoidal flares take several weeks to calm down.  These suggestions can help: Warm soaks.  This helps more than any topical medication.  Use up to 8 times a day.  Usually sitz baths or sitting in a warm bathtub helps.  Sitting on moist warm towels are helpful.  Switching to ice packs/cool compresses can be helpful Normalize your bowels.  Extremes of diarrhea or constipation will make hemorrhoids worse.  One soft bowel movement a day is the goal.  Fiber can help get your bowels regular Wet wipes instead of toilet paper Pain control with a NSAID such as ibuprofen (Advil) or naproxen (Aleve) or acetaminophen (Tylenol) around the clock.  Narcotics are constipating and should be minimized if possible Topical creams contain steroids (bydrocortisone) or local anesthetic (xylocaine) can help make pain and itching more tolerable.   EVALUATION If hemorrhoids are still causing problems, you could benefit by an evaluation by a surgeon.  The surgeon will obtain a history and examine you.  If hemorrhoids are diagnosed, some therapies can be offered in the office, usually with an anoscope into the less sensitive area of the rectum: -injection of hemorrhoids (sclerotherapy) can scar the blood vessels of the swollen/enlarged hemorrhoids to help shrink them down to a more normal size -rubber banding of the enlarged hemorrhoids to help shrink them down to a more normal size -drainage of the blood clot causing a thrombosed hemorrhoid,  to relieve the severe pain   While 90% of the time such problems from hemorrhoids can be managed without preceding to surgery, sometimes the hemorrhoids require a operation to control the problem (uncontrolled bleeding, prolapse, pain, etc.).   This involves being placed under  general anesthesia where the surgeon can confirm the diagnosis and remove, suture, or staple the hemorrhoid(s).  Your surgeon can help you treat the problem appropriately.    GETTING TO GOOD BOWEL HEALTH. Irregular bowel habits such as constipation and diarrhea can lead to many problems over time.  Having one soft bowel movement a day is the most important way to prevent further problems.  The anorectal canal is designed to handle stretching and feces to safely manage our ability to get rid of solid waste (feces, poop, stool) out of our body.  BUT, hard constipated stools can act like ripping concrete bricks and diarrhea can be a burning fire to this very sensitive area of our body, causing inflamed hemorrhoids, anal fissures, increasing risk is perirectal abscesses, abdominal pain/bloating, an making irritable bowel worse.     The goal: ONE SOFT BOWEL MOVEMENT A DAY!  To have soft, regular bowel movements:    Drink at least  8 tall glasses of water a day.     Take plenty of fiber.  Fiber is the undigested part of plant food that passes into the colon, acting s "natures broom" to encourage bowel motility and movement.  Fiber can absorb and hold large amounts of water. This results in a larger, bulkier stool, which is soft and easier to pass. Work gradually over several weeks up to 6 servings a day of fiber (25g a day even more if needed) in the form of: o Vegetables -- Root (potatoes, carrots, turnips), leafy green (lettuce, salad greens, celery, spinach), or cooked high residue (cabbage, broccoli, etc) o Fruit -- Fresh (unpeeled skin & pulp), Dried (prunes, apricots, cherries, etc ),  or stewed ( applesauce)  o Whole grain breads, pasta, etc (whole wheat)  o Bran cereals    Bulking Agents -- This type of water-retaining fiber generally is easily obtained each day by one of the following:  o Psyllium bran -- The psyllium plant is remarkable because its ground seeds can retain so much water. This product  is available as Metamucil, Konsyl, Effersyllium, Per Diem Fiber, or the less expensive generic preparation in drug and health food stores. Although labeled a laxative, it really is not a laxative.  o Methylcellulose -- This is another fiber derived from wood which also retains water. It is available as Citrucel. o Polyethylene Glycol - and "artificial" fiber commonly called Miralax or Glycolax.  It is helpful for people with gassy or bloated feelings with regular fiber o Flax Seed - a less gassy fiber than psyllium   No reading or other relaxing activity while on the toilet. If bowel movements take longer than 5 minutes, you are too constipated   AVOID CONSTIPATION.  High fiber and water intake usually takes care of this.  Sometimes a laxative is needed to stimulate more frequent bowel movements, but    Laxatives are not a good long-term solution as it can wear the colon out. o Osmotics (Milk of Magnesia, Fleets phosphosoda, Magnesium citrate, MiraLax, GoLytely) are safer than  o Stimulants (Senokot, Castor Oil, Dulcolax, Ex Lax)    o Do not take laxatives for more than 7days in a row.    IF SEVERELY CONSTIPATED, try a Bowel Retraining Program: o Do not use laxatives.  o Eat a diet high in roughage, such as bran cereals and leafy vegetables.  o Drink six (6) ounces of prune or apricot juice each morning.  o Eat two (2) large servings of stewed fruit each day.  o Take one (1) heaping tablespoon of a psyllium-based bulking agent twice a day. Use sugar-free sweetener when possible to avoid excessive calories.  o Eat a normal breakfast.  o Set aside 15 minutes after breakfast to sit on the toilet, but do not strain to have a bowel movement.  o If you do not have a bowel movement by the third day, use an enema and repeat the above steps.    Controlling diarrhea o Switch to liquids and simpler foods for a few days to avoid stressing your intestines further. o Avoid dairy products (especially milk &  ice cream) for a short time.  The intestines often can lose the ability to digest lactose when stressed. o Avoid foods that cause gassiness or bloating.  Typical foods include beans and other legumes, cabbage, broccoli, and dairy foods.  Every person has some sensitivity to other foods, so listen to our body and avoid those foods that trigger problems for  you. o Adding fiber (Citrucel, Metamucil, psyllium, Miralax) gradually can help thicken stools by absorbing excess fluid and retrain the intestines to act more normally.  Slowly increase the dose over a few weeks.  Too much fiber too soon can backfire and cause cramping & bloating. o Probiotics (such as active yogurt, Align, etc) may help repopulate the intestines and colon with normal bacteria and calm down a sensitive digestive tract.  Most studies show it to be of mild help, though, and such products can be costly. o Medicines:   Bismuth subsalicylate (ex. Kayopectate, Pepto Bismol) every 30 minutes for up to 6 doses can help control diarrhea.  Avoid if pregnant.   Loperamide (Immodium) can slow down diarrhea.  Start with two tablets (4mg  total) first and then try one tablet every 6 hours.  Avoid if you are having fevers or severe pain.  If you are not better or start feeling worse, stop all medicines and call your doctor for advice o Call your doctor if you are getting worse or not better.  Sometimes further testing (cultures, endoscopy, X-ray studies, bloodwork, etc) may be needed to help diagnose and treat the cause of the diarrhea.  Diet and Irritable Bowel Syndrome  No cure has been found for irritable bowel syndrome (IBS). Many options are available to treat the symptoms. Your caregiver will give you the best treatments available for your symptoms. He or she will also encourage you to manage stress and to make changes to your diet. You need to work with your caregiver and Registered Dietician to find the best combination of medicine, diet,  counseling, and support to control your symptoms. The following are some diet suggestions. FOODS THAT MAKE IBS WORSE  Fatty foods, such as [place] fries.  Milk products, such as cheese or ice cream.  Chocolate.  Alcohol.  Caffeine (found in coffee and some sodas).  Carbonated drinks, such as soda. If certain foods cause symptoms, you should eat less of them or stop eating them. FOOD JOURNAL   Keep a journal of the foods that seem to cause distress. Write down:  What you are eating during the day and when.  What problems you are having after eating.  When the symptoms occur in relation to your meals.  What foods always make you feel badly.  Take your notes with you to your caregiver to see if you should stop eating certain foods. FOODS THAT MAKE IBS BETTER Fiber reduces IBS symptoms, especially constipation, because it makes stools soft, bulky, and easier to pass. Fiber is found in bran, bread, cereal, beans, fruit, and vegetables. Examples of foods with fiber include:  Apples.  Peaches.  Pears.  Berries.  Figs.  Broccoli, raw.  Cabbage.  Carrots.  Raw peas.  Kidney beans.  Lima beans.  Whole-grain bread.  Whole-grain cereal. Add foods with fiber to your diet a little at a time. This will let your body get used to them. Too much fiber at once might cause gas and swelling of your abdomen. This can trigger symptoms in a person with IBS. Caregivers usually recommend a diet with enough fiber to produce soft, painless bowel movements. High fiber diets may cause gas and bloating. However, these symptoms often go away within a few weeks, as your body adjusts. In many cases, dietary fiber may lessen IBS symptoms, particularly constipation. However, it may not help pain or diarrhea. High fiber diets keep the colon mildly enlarged (distended) with the added fiber. This may help prevent spasms  in the colon. Some forms of fiber also keep water in the stool, thereby  preventing hard stools that are difficult to pass.  Besides telling you to eat more foods with fiber, your caregiver may also tell you to get more fiber by taking a fiber pill or drinking water mixed with a special high fiber powder. An example of this is a natural fiber laxative containing psyllium seed.  TIPS  Large meals can cause cramping and diarrhea in people with IBS. If this happens to you, try eating 4 or 5 small meals a day, or try eating less at each of your usual 3 meals. It may also help if your meals are low in fat and high in carbohydrates. Examples of carbohydrates are pasta, rice, whole-grain breads and cereals, fruits, and vegetables.  If dairy products cause your symptoms to flare up, you can try eating less of those foods. You might be able to handle yogurt better than other dairy products, because it contains bacteria that helps with digestion. Dairy products are an important source of calcium and other nutrients. If you need to avoid dairy products, be sure to talk with a Registered Dietitian about getting these nutrients through other food sources.  Drink enough water and fluids to keep your urine clear or pale yellow. This is important, especially if you have diarrhea. FOR MORE INFORMATION  International Foundation for Functional Gastrointestinal Disorders: www.iffgd.org  National Digestive Diseases Information Clearinghouse: digestive.StageSync.si Document Released: 07/28/2003 Document Revised: 07/30/2011 Document Reviewed: 04/14/2007 [facility] Patient Information 2014 [place], [place].  Foods that are generally well tolerated by people with irritable bowel syndrome: Water, Ginger Ale, Sprite, and Gatorade.  Soy milk or rice milk.  Soy or rice based products.  Plain pasta, plain noodles, white rice. No sauces or gravies.  Potato: boiled or baked. No [place] Sprung.  Breads: [place], [place], whole white, English muffins, and white rolls.  Plain fish, plain chicken, plain  [place], or plain ham.  Eggs: soft-boiled, poached.  Cereals: Plain Cornflakes, Rice Krispies, Corn or Rice Chex, Cheerios; dry or with soymilk or rice milk.  Salads: lettuce, hard-boiled egg slices, oil and vinegar dressing.  Cooked peas, carrots (avoid raw vegetables).  Margarine, jams, jellies, peanut butter.  In small amounts: applesauce, cantaloupe, watermelon, honeydew melon.  In small amounts: fruit cocktail, peaches, pears (canned, non-dietetic).   Substitutes and alternatives are available for the foods and beverages that induce gastrointestinal symptoms. Listed above are examples of some of the foods and beverages that IBS patents have found to be well tolerated. It is important to use daily vitamins (multivitamin, calcium with vitamin D, folic acid, vitamin B complex, and vitamin C) when on a restricted diet.

## 2013-02-04 NOTE — Progress Notes (Signed)
Subjective:     Patient ID: Tina Hardy, female   DOB: Aug 24, 1940, 72 y.o.   MRN: [id number]  HPI  Tina Hardy  1940-10-05 [id number]  Patient Care Team: Minda Meo, MD as PCP - General (Internal Medicine) Hart Carwin, MD as Consulting Physician (Gastroenterology)  This patient is a 72 y.o.female who presents today for surgical evaluation at the request self.   Reason for visit: "I want my life back"  The patient came over her own volition struggling.  I spent over 40 minutes in the room trying to sort out her concerns.  Complains that gastroenterology does not know what they are doing.  She complains that her primary care physician has not addressed her concerns & does not listen to her.  She complained that she has had surgery in the past that has been painful.  Gradually I worked to help focus her to discuss the issue today.  Occasionally I had to be a little more stern to get her to focus.  It sounds like she has had problems with intermittent hemorrhoidal flares.  Had an episode of diarrhea.  Was given Flagyl.  That seemed to help.    She has had some issues of nausea and vomiting.  There are no specific food intolerances.  She is drinking nonfat buttermilk.  Using some homeopathic probiotics I think.  Been taking a health store fiber supplement twice a day.  Does have some bloatedness but denies increased flatulence or belching.  Did not want to try a line or prescribed probiotics.   She has had some abdominal pain.  Claims she got a test at a drugstore that proved she had a urinary infection.  Took cranberry juice and that seems better now.  She was recommended to try probiotics.  She has not convinced that helped.  Was given Levsin for cramping.  Has not helped much.  She had been given suppositories (she thinks it was Anusol).  That helped a little bit but she ran out.  She did not call for refills.  She notes that she will be sitting on the toilet "praying on the pot  for help," Saying that the discomfort has been too much.  She notes that she feels like she has a "brick" in her pain eased.  Has had intermittent old and new blood.  Some episodes of anal pain in the past.  Thinks she has hemorrhoids.    She came to me because a friend recommended her.  Then 10 minutes later, she complained, she hurt "the worst in my life" after I had to do emergency surgery on her for abdominal wall hernia incarcerated with sigmoid colon in 2008.  She claims she has no energy.  She used to go the gym.  She does not anymore.  She does not walk.  She says "[facility] gastroenterology tell me that I would be in bed for the next six weeks?!?!"  She would go on tangents discussing the stress of taking care of her of her husband with dementia.  Said she cannot sleep but did not like to take Ambien.  "I do not want to get on any pills if I do not have to."  Patient Active Problem List   Diagnosis Date Noted  . Chronic abdominal pain with cramping/bloating 02/04/2013  . Stress at home 02/04/2013  . Rectal bleeding 01/05/2013  . Hepatic hemangioma 07/16/2012  . DIVERTICULOSIS OF COLON 06/22/2008  . HEMOCCULT POSITIVE STOOL 06/22/2008  . COLONIC POLYPS, HYPERPLASTIC,  HX OF 06/22/2008    Past Medical History  Diagnosis Date  . Liver hemangioma   . Diverticulosis   . Colon polyp 07/18/2004    Hyperplastic  . Pernicious anemia   . Rectal bleed   . Hepatic hemangioma 07/16/2012    Past Surgical History  Procedure Laterality Date  . Appendectomy  2012    Dr Abbey Chatters  . Umbilical hernia repair  12/24/06    with reduction of sigmoid colon which was incarcerated  . Knee surgery      right  . Inguinal hernia repair  12/24/06    left; laparoscopic  . Cholecystectomy  05/30/2012    Procedure: LAPAROSCOPIC CHOLECYSTECTOMY WITH INTRAOPERATIVE CHOLANGIOGRAM;  Surgeon: Atilano Ina, MD,FACS;  Location: [facility] OR;  Service: General;  Laterality: N/A;    History   Social History  . Marital  Status: Married    Spouse Name: N/A    Number of Children: 1  . Years of Education: N/A   Occupational History  . retired    Social History Main Topics  . Smoking status: Former Smoker    Quit date: 12/04/1972  . Smokeless tobacco: Never Used  . Alcohol Use: Yes     Comment: occasional  . Drug Use: No     Comment: past  . Sexual Activity: Not on file   Other Topics Concern  . Not on file   Social History Narrative  . No narrative on file    Family History  Problem Relation Age of Onset  . Colon cancer Neg Hx   . Crohn's disease Other     neice  . Crohn's disease Other     nephew    Current Outpatient Prescriptions  Medication Sig Dispense Refill  . ciprofloxacin (CIPRO) 250 MG tablet       . colesevelam (WELCHOL) 625 MG tablet Take 3 tablets (1,875 mg total) by mouth daily.  60 tablet  0  . diphenoxylate-atropine (LOMOTIL) 2.5-0.025 MG per tablet Take 1 tablet by mouth 4 (four) times daily as needed for diarrhea or loose stools.  30 tablet  0  . hydrocortisone (ANUSOL-HC) 25 MG suppository Place 1 suppository (25 mg total) rectally 2 (two) times daily.  14 suppository  1  . hyoscyamine (LEVSIN SL) 0.125 MG SL tablet Place 1 tablet (0.125 mg total) under the tongue every 4 (four) hours as needed for cramping.  30 tablet  0  . metroNIDAZOLE (FLAGYL) 500 MG tablet Take one po BID x 10 days  20 tablet  0  . Multiple Vitamin (MULTIVITAMIN) tablet Take 2 tablets by mouth daily.       . NON FORMULARY Paul bragg apple cider vin with the mother       No current facility-administered medications for this visit.     Allergies  Allergen Reactions  . Codeine     REACTION: questionable  . Penicillins     REACTION: questionable    BP 128/78  Pulse 84  Temp(Src) 98.7 F (37.1 C) (Temporal)  Resp 14  Ht 5\' 5"  (1.651 m)  Wt 154 lb 3.2 oz (69.945 kg)  BMI 25.66 kg/m2  No results found.   Review of Systems  Constitutional: Positive for activity change. Negative for  fever, chills, diaphoresis, appetite change, fatigue and unexpected weight change.  HENT: Negative for ear pain, sore throat, trouble swallowing, neck pain and ear discharge.   Eyes: Negative for photophobia, discharge and visual disturbance.  Respiratory: Negative for cough, choking, chest tightness, shortness  of breath, wheezing and stridor.   Cardiovascular: Negative for chest pain, palpitations and leg swelling.  Gastrointestinal: Positive for nausea, abdominal pain, diarrhea, blood in stool, abdominal distention, anal bleeding and rectal pain. Negative for vomiting and constipation.  Endocrine: Negative for cold intolerance, heat intolerance and polyuria.  Genitourinary: Negative for dysuria, urgency, frequency, hematuria, flank pain, decreased urine volume, vaginal bleeding, vaginal discharge, enuresis, difficulty urinating and vaginal pain.  Musculoskeletal: Negative for myalgias and gait problem.  Skin: Negative for color change, pallor and rash.  Allergic/Immunologic: Negative for environmental allergies, food allergies and immunocompromised state.  Neurological: Negative for dizziness, tremors, speech difficulty, weakness and numbness.  Hematological: Negative for adenopathy.  Psychiatric/Behavioral: Positive for sleep disturbance and dysphoric mood. Negative for suicidal ideas, hallucinations, behavioral problems, confusion, self-injury, decreased concentration and agitation. The patient is hyperactive.        Objective:   Physical Exam  Constitutional: She is oriented to person, place, and time. She appears well-developed and well-nourished. No distress.  HENT:  Head: Normocephalic.  Mouth/Throat: Oropharynx is clear and moist. No oropharyngeal exudate.  Eyes: Conjunctivae and EOM are normal. Pupils are equal, round, and reactive to light. No scleral icterus.  Neck: Normal range of motion. Neck supple. No tracheal deviation present.  Cardiovascular: Normal rate, regular rhythm  and intact distal pulses.   Pulmonary/Chest: Effort normal and breath sounds normal. No stridor. No respiratory distress. She exhibits no tenderness.  Abdominal: Soft. She exhibits no distension and no mass. There is no tenderness. Hernia confirmed negative in the right inguinal area and confirmed negative in the left inguinal area.  Genitourinary: No vaginal discharge found.  Exam done with assistance of female Medical Assistant in the room.  Perianal skin clean with good hygiene.  No pruritis.  No pilonidal disease.  No fissure.  No abscess/fistula.    No external skin tags / hemorrhoids.  Tolerates digital and anoscopic rectal exam.  Normal sphincter tone.  No rectal masses.  Mildly enlarged right anterior internal hemorrhoid.  No prolapse or ulceration or bleeding.  Otherwise, hemorrhoidal piles WNL   Musculoskeletal: Normal range of motion. She exhibits no tenderness.       Right elbow: She exhibits normal range of motion.       Left elbow: She exhibits normal range of motion.       Right wrist: She exhibits normal range of motion.       Left wrist: She exhibits normal range of motion.       Right hand: Normal strength noted.       Left hand: Normal strength noted.  Lymphadenopathy:       Head (right side): No posterior auricular adenopathy present.       Head (left side): No posterior auricular adenopathy present.    She has no cervical adenopathy.    She has no axillary adenopathy.       Right: No inguinal adenopathy present.       Left: No inguinal adenopathy present.  Neurological: She is alert and oriented to person, place, and time. No cranial nerve deficit. She exhibits normal muscle tone. Coordination normal.  Skin: Skin is warm and dry. No rash noted. She is not diaphoretic. No erythema.  Psychiatric: Judgment and thought content normal. Her mood appears not anxious. Her affect is blunt and labile. Her affect is not angry. Her speech is tangential. Her speech is not delayed and  not slurred. She is hyperactive. She is not agitated, not aggressive, not slowed, not  withdrawn, not actively hallucinating and not combative. Thought content is not paranoid and not delusional. Cognition and memory are normal. She expresses no homicidal and no suicidal ideation. She is communicative.  Very talkative.  Interrupts often. Intense.  Complained about every doctor physician over the past 10 years not helping her. She is attentive.       Assessment:     Intermittent rectal bleeding perhaps old or new blood.  Improving but not resolved.  Mild hemorrhoidal disease.  Irregular bowels with abdominal cramping/discomfort/nausea.  Suspicious for IBS.  Some emotional lability and intensity and rambling suspicious for at least anxiety/depression if not hypomania.  Numerous home stress factors not well controlled.     Plan:     Again, I spent a long time reviewing the chart and her records.  I spent quite a bit of time in the room trying to get her to focus to talk about her concerns.  I think she has mild hemorrhoidal disease.  It sounds like she had a hemorrhoidal flare.  The bleeding has gone down but not resolved.  I did not see anything anatomically that required injection or banding as far as her mild hemorrhoidal disease.  I am skeptical that she would tolerate it well.  She seems to complain with every intervention done, so I did not want to rush into anything unless I had a more strong indication.    I gave her handouts giving her options as well.  I did discuss about general bowel/hemorrhoidal management:  The anatomy & physiology of the anorectal region was discussed.  The pathophysiology of hemorrhoids and differential diagnosis was discussed.  Natural history progression  was discussed.   I stressed the importance of a bowel regimen to have daily soft bowel movements to minimize progression of disease.   Goal of one BM / day ideal.  Use of wet wipes, warm baths, avoiding  straining, etc were emphasized.  I offered to give her Analpram if she is having irritation.  I do not think using rectal suppositories was a great idea.    Educational handouts further explaining the pathology, treatment options, and bowel regimen were given as well.   The patient expressed understanding.  I wonder if she has irritable bowel syndrome.  She claims that no one has given A list of what foods to use or avoid.  Prior list have been confusing.  I did give her a list of foods to try to use enemas to try and avoid.  Noted it is often individual tolerance and is a trial and error process.  I strongly recommended a bowel regimen.  I noted it may be wise to stop her current fiber supplement and switched to flaxseed or MiraLAX.  Try different alternative.  Perhaps some of her bloating and crampiness will improve or resolve with a different bowel regimen.  I explained this numerous times.    I noted that since the rectal bleeding is improving, follow this and use the above recommendations.  If she has bleeding that does not resolve in the next few months, I agreed with her instinct to reconsider a colonoscopy.  Her last one was in 2010.  Persistent bleeding is not normal.  I recommend she get back to exercising.  Start with walking.  Get back to the gym.  She certainly seemed to have enough energy today.  I noted exercise can help with abdominal/bowel issues.  I agree with Dr. Juanda Chance that I think she has some depression  and stress exacerbating things.  She seemed hypomanic to me today.  I tried to spend time embolizing with the stress of managing her husband and staying active.  I strongly recommend she discuss this with her primary care physician.  Consider discussing with his psychologist or psychiatrist to help her manage this.  At one point she seemed to agree and was interested.  Then later in the conversation, she denied she needed anything and she was fine.  I gave her handouts.  Went over  things numerous times.  I try to be patient.  In the end, she complained to my medical assistant that she regretted even coming today and thought that I had a poor bedside manner.  Hopefully she can find help somewhere.

## 2013-02-10 ENCOUNTER — Ambulatory Visit: Payer: Medicare Other | Admitting: Internal Medicine

## 2013-02-17 ENCOUNTER — Ambulatory Visit: Payer: Medicare Other | Admitting: Internal Medicine

## 2013-08-25 ENCOUNTER — Telehealth: Payer: Self-pay | Admitting: Internal Medicine

## 2013-08-25 NOTE — Telephone Encounter (Signed)
Message copied by Oliva Bustard on Tue Aug 25, 2013  8:38 AM ------      Message from: Larina Bras      Created: Tue Feb 17, 2013  1:52 PM                   ----- Message -----         From: [place] Dragon, MD         Sent: 02/17/2013   1:38 PM           To: Larina Bras, CMA            Please charge no show      ----- Message -----         From: Larina Bras, CMA         Sent: 02/17/2013   1:33 PM           To: [place] Dragon, MD                        ----- Message -----         From: [place] Dragon, MD         Sent: 02/17/2013   1:22 PM           To: Larina Bras, CMA            Yes, charge no show fee.      ----- Message -----         From: Larina Bras, CMA         Sent: 02/17/2013   9:43 AM           To: [place] Dragon, MD            Patient no showed appointment with Dr Olevia Perches on 02/17/13. Dr Olevia Perches, do you want to charge no show fee?             ------

## 2014-02-24 ENCOUNTER — Encounter: Payer: Self-pay | Admitting: Internal Medicine

## 2014-08-05 DIAGNOSIS — Z130 Encounter for screening for diseases of the blood and blood-forming organs and certain disorders involving the immune mechanism: Secondary | ICD-10-CM | POA: Diagnosis not present

## 2014-08-05 DIAGNOSIS — Z1231 Encounter for screening mammogram for malignant neoplasm of breast: Secondary | ICD-10-CM | POA: Diagnosis not present

## 2014-08-05 DIAGNOSIS — Z01419 Encounter for gynecological examination (general) (routine) without abnormal findings: Secondary | ICD-10-CM | POA: Diagnosis not present

## 2014-08-05 DIAGNOSIS — Z6826 Body mass index (BMI) 26.0-26.9, adult: Secondary | ICD-10-CM | POA: Diagnosis not present

## 2014-08-05 DIAGNOSIS — R8290 Unspecified abnormal findings in urine: Secondary | ICD-10-CM | POA: Diagnosis not present

## 2014-08-09 ENCOUNTER — Other Ambulatory Visit: Payer: Self-pay | Admitting: Obstetrics and Gynecology

## 2014-08-09 DIAGNOSIS — R928 Other abnormal and inconclusive findings on diagnostic imaging of breast: Secondary | ICD-10-CM

## 2014-08-12 ENCOUNTER — Ambulatory Visit
Admission: RE | Admit: 2014-08-12 | Discharge: 2014-08-12 | Disposition: A | Discharge status: Home / Self Care | Payer: Medicare Other | Source: Ambulatory Visit | Attending: Obstetrics and Gynecology | Admitting: Obstetrics and Gynecology

## 2014-08-12 DIAGNOSIS — R928 Other abnormal and inconclusive findings on diagnostic imaging of breast: Secondary | ICD-10-CM

## 2014-11-24 DIAGNOSIS — N390 Urinary tract infection, site not specified: Secondary | ICD-10-CM | POA: Diagnosis not present

## 2014-11-24 DIAGNOSIS — R8290 Unspecified abnormal findings in urine: Secondary | ICD-10-CM | POA: Diagnosis not present

## 2014-11-24 DIAGNOSIS — Z1212 Encounter for screening for malignant neoplasm of rectum: Secondary | ICD-10-CM | POA: Diagnosis not present

## 2014-11-24 DIAGNOSIS — I10 Essential (primary) hypertension: Secondary | ICD-10-CM | POA: Diagnosis not present

## 2014-11-24 DIAGNOSIS — E785 Hyperlipidemia, unspecified: Secondary | ICD-10-CM | POA: Diagnosis not present

## 2014-12-01 DIAGNOSIS — Z6828 Body mass index (BMI) 28.0-28.9, adult: Secondary | ICD-10-CM | POA: Diagnosis not present

## 2014-12-01 DIAGNOSIS — E785 Hyperlipidemia, unspecified: Secondary | ICD-10-CM | POA: Diagnosis not present

## 2014-12-01 DIAGNOSIS — D510 Vitamin B12 deficiency anemia due to intrinsic factor deficiency: Secondary | ICD-10-CM | POA: Diagnosis not present

## 2014-12-01 DIAGNOSIS — Z1389 Encounter for screening for other disorder: Secondary | ICD-10-CM | POA: Diagnosis not present

## 2014-12-01 DIAGNOSIS — F419 Anxiety disorder, unspecified: Secondary | ICD-10-CM | POA: Diagnosis not present

## 2014-12-01 DIAGNOSIS — I10 Essential (primary) hypertension: Secondary | ICD-10-CM | POA: Diagnosis not present

## 2014-12-01 DIAGNOSIS — Z0000 Encounter for general adult medical examination without abnormal findings: Secondary | ICD-10-CM | POA: Diagnosis not present

## 2015-08-09 DIAGNOSIS — Z1389 Encounter for screening for other disorder: Secondary | ICD-10-CM | POA: Diagnosis not present

## 2015-08-09 DIAGNOSIS — Z01419 Encounter for gynecological examination (general) (routine) without abnormal findings: Secondary | ICD-10-CM | POA: Diagnosis not present

## 2015-08-09 DIAGNOSIS — Z1231 Encounter for screening mammogram for malignant neoplasm of breast: Secondary | ICD-10-CM | POA: Diagnosis not present

## 2015-08-09 DIAGNOSIS — Z0000 Encounter for general adult medical examination without abnormal findings: Secondary | ICD-10-CM | POA: Diagnosis not present

## 2015-08-09 DIAGNOSIS — Z6825 Body mass index (BMI) 25.0-25.9, adult: Secondary | ICD-10-CM | POA: Diagnosis not present

## 2015-12-08 DIAGNOSIS — D510 Vitamin B12 deficiency anemia due to intrinsic factor deficiency: Secondary | ICD-10-CM | POA: Diagnosis not present

## 2015-12-08 DIAGNOSIS — Z0000 Encounter for general adult medical examination without abnormal findings: Secondary | ICD-10-CM | POA: Diagnosis not present

## 2015-12-08 DIAGNOSIS — N390 Urinary tract infection, site not specified: Secondary | ICD-10-CM | POA: Diagnosis not present

## 2015-12-08 DIAGNOSIS — R8290 Unspecified abnormal findings in urine: Secondary | ICD-10-CM | POA: Diagnosis not present

## 2015-12-08 DIAGNOSIS — I10 Essential (primary) hypertension: Secondary | ICD-10-CM | POA: Diagnosis not present

## 2015-12-12 DIAGNOSIS — J302 Other seasonal allergic rhinitis: Secondary | ICD-10-CM | POA: Diagnosis not present

## 2015-12-12 DIAGNOSIS — F419 Anxiety disorder, unspecified: Secondary | ICD-10-CM | POA: Diagnosis not present

## 2015-12-12 DIAGNOSIS — Z6827 Body mass index (BMI) 27.0-27.9, adult: Secondary | ICD-10-CM | POA: Diagnosis not present

## 2015-12-12 DIAGNOSIS — I10 Essential (primary) hypertension: Secondary | ICD-10-CM | POA: Diagnosis not present

## 2015-12-12 DIAGNOSIS — R61 Generalized hyperhidrosis: Secondary | ICD-10-CM | POA: Diagnosis not present

## 2015-12-12 DIAGNOSIS — D510 Vitamin B12 deficiency anemia due to intrinsic factor deficiency: Secondary | ICD-10-CM | POA: Diagnosis not present

## 2015-12-12 DIAGNOSIS — Z0000 Encounter for general adult medical examination without abnormal findings: Secondary | ICD-10-CM | POA: Diagnosis not present

## 2015-12-12 DIAGNOSIS — M545 Low back pain: Secondary | ICD-10-CM | POA: Diagnosis not present

## 2015-12-12 DIAGNOSIS — E784 Other hyperlipidemia: Secondary | ICD-10-CM | POA: Diagnosis not present

## 2015-12-12 DIAGNOSIS — Z1389 Encounter for screening for other disorder: Secondary | ICD-10-CM | POA: Diagnosis not present

## 2015-12-12 DIAGNOSIS — Z23 Encounter for immunization: Secondary | ICD-10-CM | POA: Diagnosis not present

## 2015-12-16 DIAGNOSIS — Z1212 Encounter for screening for malignant neoplasm of rectum: Secondary | ICD-10-CM | POA: Diagnosis not present

## 2016-04-23 DIAGNOSIS — R300 Dysuria: Secondary | ICD-10-CM | POA: Diagnosis not present

## 2016-08-15 DIAGNOSIS — Z130 Encounter for screening for diseases of the blood and blood-forming organs and certain disorders involving the immune mechanism: Secondary | ICD-10-CM | POA: Diagnosis not present

## 2016-08-15 DIAGNOSIS — Z1389 Encounter for screening for other disorder: Secondary | ICD-10-CM | POA: Diagnosis not present

## 2016-08-15 DIAGNOSIS — Z1231 Encounter for screening mammogram for malignant neoplasm of breast: Secondary | ICD-10-CM | POA: Diagnosis not present

## 2016-08-15 DIAGNOSIS — Z6822 Body mass index (BMI) 22.0-22.9, adult: Secondary | ICD-10-CM | POA: Diagnosis not present

## 2016-08-15 DIAGNOSIS — Z01419 Encounter for gynecological examination (general) (routine) without abnormal findings: Secondary | ICD-10-CM | POA: Diagnosis not present

## 2016-08-16 DIAGNOSIS — Z1211 Encounter for screening for malignant neoplasm of colon: Secondary | ICD-10-CM | POA: Diagnosis not present

## 2016-08-27 ENCOUNTER — Other Ambulatory Visit: Payer: Self-pay | Admitting: Obstetrics and Gynecology

## 2016-08-27 DIAGNOSIS — R928 Other abnormal and inconclusive findings on diagnostic imaging of breast: Secondary | ICD-10-CM

## 2016-08-27 DIAGNOSIS — Z1231 Encounter for screening mammogram for malignant neoplasm of breast: Secondary | ICD-10-CM

## 2016-08-29 ENCOUNTER — Ambulatory Visit: Payer: Medicare Other

## 2016-08-29 ENCOUNTER — Ambulatory Visit
Admission: RE | Admit: 2016-08-29 | Discharge: 2016-08-29 | Disposition: A | Discharge status: Home / Self Care | Payer: Medicare Other | Source: Ambulatory Visit | Attending: Obstetrics and Gynecology | Admitting: Obstetrics and Gynecology

## 2016-08-29 DIAGNOSIS — N6001 Solitary cyst of right breast: Secondary | ICD-10-CM | POA: Diagnosis not present

## 2016-08-29 DIAGNOSIS — R928 Other abnormal and inconclusive findings on diagnostic imaging of breast: Secondary | ICD-10-CM

## 2017-07-02 ENCOUNTER — Emergency Department (HOSPITAL_COMMUNITY)
Admission: EM | Admit: 2017-07-02 | Discharge: 2017-07-02 | Disposition: A | Discharge status: Home / Self Care | Payer: Medicare Other | Attending: Emergency Medicine | Admitting: Emergency Medicine

## 2017-07-02 ENCOUNTER — Encounter (HOSPITAL_COMMUNITY): Payer: Self-pay

## 2017-07-02 ENCOUNTER — Emergency Department (HOSPITAL_COMMUNITY): Payer: Medicare Other

## 2017-07-02 DIAGNOSIS — Z87891 Personal history of nicotine dependence: Secondary | ICD-10-CM | POA: Insufficient documentation

## 2017-07-02 DIAGNOSIS — B9789 Other viral agents as the cause of diseases classified elsewhere: Secondary | ICD-10-CM | POA: Diagnosis not present

## 2017-07-02 DIAGNOSIS — J028 Acute pharyngitis due to other specified organisms: Secondary | ICD-10-CM | POA: Diagnosis not present

## 2017-07-02 DIAGNOSIS — R221 Localized swelling, mass and lump, neck: Secondary | ICD-10-CM | POA: Diagnosis not present

## 2017-07-02 DIAGNOSIS — S0993XA Unspecified injury of face, initial encounter: Secondary | ICD-10-CM | POA: Diagnosis not present

## 2017-07-02 DIAGNOSIS — J029 Acute pharyngitis, unspecified: Secondary | ICD-10-CM | POA: Insufficient documentation

## 2017-07-02 LAB — RAPID STREP SCREEN (MED CTR MEBANE ONLY): STREPTOCOCCUS, GROUP A SCREEN (DIRECT): NEGATIVE

## 2017-07-02 MED ORDER — IBUPROFEN 600 MG PO TABS: 600.0000 mg | ORAL_TABLET | Freq: Four times a day (QID) | ORAL | 0 refills | Status: DC | PRN

## 2017-07-02 MED ORDER — KETOROLAC TROMETHAMINE 30 MG/ML IJ SOLN
30.0000 mg | Freq: Once | INTRAMUSCULAR | Status: DC
  Filled 2017-07-02: qty 1

## 2017-07-02 MED ORDER — SODIUM CHLORIDE 0.9 % IV BOLUS (SEPSIS): 1000.0000 mL | Freq: Once | INTRAVENOUS | Status: DC

## 2017-07-02 MED ORDER — KETOROLAC TROMETHAMINE 30 MG/ML IJ SOLN
30.0000 mg | Freq: Once | INTRAMUSCULAR | Status: AC
  Administered 2017-07-02: 30 mg via INTRAMUSCULAR

## 2017-07-02 MED ORDER — PREDNISONE 10 MG (21) PO TBPK: ORAL_TABLET | ORAL | 0 refills | Status: DC

## 2017-07-02 NOTE — ED Provider Notes (Signed)
[facility] DEPT Provider Note   CSN: [id number] Arrival date & time: 07/02/17  6063     History   Chief Complaint Chief Complaint  Patient presents with  . Dysphagia    HPI Tina Hardy is a 77 y.o. female.  Pt presents to the ED today with sore throat.  Pt had dental surgery on 2/6 and is concerned it is a complication from that.  She has noticed some lymph node swelling to her right neck.  No f/c.  No runny nose or cough.      History reviewed. No pertinent past medical history.  There are no active problems to display for this patient.   Past Surgical History:  Procedure Laterality Date  . APPENDECTOMY    . DENTAL SURGERY    . HERNIA REPAIR      OB History    No data available       Home Medications    Prior to Admission medications   Medication Sig Start Date End Date Taking? Authorizing Provider  clindamycin (CLEOCIN) 150 MG capsule Take 150 mg by mouth 4 (four) times daily.   Yes [provider]  ibuprofen (ADVIL,MOTRIN) 600 MG tablet Take 1 tablet (600 mg total) by mouth every 6 (six) hours as needed. 07/02/17   Isla Pence, MD  predniSONE (STERAPRED UNI-PAK 21 TAB) 10 MG (21) TBPK tablet Take 6 tabs by mouth daily  for 2 days, then 5 tabs for 2 days, then 4 tabs for 2 days, then 3 tabs for 2 days, 2 tabs for 2 days, then 1 tab by mouth daily for 2 days 07/02/17   Isla Pence, MD    Family History Family History  Problem Relation Age of Onset  . Heart failure Mother     Social History Social History   Tobacco Use  . Smoking status: Former Smoker    Types: Cigarettes  . Smokeless tobacco: Never Used  Substance Use Topics  . Alcohol use: Yes    Comment: rarely  . Drug use: Yes    Types: Marijuana    Comment: occasionally     Allergies   Codeine and Penicillins   Review of Systems Review of Systems  HENT: Positive for sore throat.   All other systems reviewed and are  negative.    Physical Exam Updated Vital Signs BP (!) 157/99   Pulse 99   Temp 98.3 F (36.8 C)   Resp 15   Ht 5' 5.5" (1.664 m)   Wt 72.6 kg (160 lb)   SpO2 93%   BMI 26.22 kg/m   Physical Exam  Constitutional: She is oriented to person, place, and time. She appears well-developed and well-nourished.  HENT:  Head: Normocephalic and atraumatic.  Right Ear: External ear normal.  Left Ear: External ear normal.  Nose: Nose normal.  Mouth/Throat: Posterior oropharyngeal erythema present.  Eyes: Conjunctivae and EOM are normal. Pupils are equal, round, and reactive to light.  Neck: Normal range of motion. Neck supple.  Cardiovascular: Normal rate, regular rhythm, normal heart sounds and intact distal pulses.  Pulmonary/Chest: Effort normal and breath sounds normal.  Abdominal: Soft. Bowel sounds are normal.  Musculoskeletal: Normal range of motion.  Neurological: She is alert and oriented to person, place, and time.  Skin: Skin is warm. Capillary refill takes less than 2 seconds.  Psychiatric: She has a normal mood and affect. Her behavior is normal. Judgment and thought content normal.  Nursing note and vitals reviewed.  ED Treatments / Results  Labs (all labs ordered are listed, but only abnormal results are displayed) Labs Reviewed  RAPID STREP SCREEN (NOT AT [facility])  CULTURE, GROUP A STREP [facility])    EKG  EKG Interpretation None       Radiology Ct Maxillofacial Wo Contrast  Result Date: 07/02/2017 CLINICAL DATA:  Recent dental surgery 6 days ago. Worsening throat swelling. Patient feels a lump on the right temple. EXAM: CT MAXILLOFACIAL WITHOUT CONTRAST TECHNIQUE: Multidetector CT imaging of the maxillofacial structures was performed. Multiplanar CT image reconstructions were also generated. COMPARISON:  None. FINDINGS: Osseous: No fracture or mandibular dislocation. No destructive process. Degenerative changes of the cervical spine. Orbits: Negative. No  traumatic or inflammatory finding. Sinuses: Clear. Soft tissues: Negative. No retropharyngeal fluid collection. No abnormality deep to the BB marker along the right temple. Limited intracranial: No significant or unexpected finding. IMPRESSION: 1. No acute abnormality. 2. No abnormality deep to the BB marker along the right temple. Electronically Signed   By: Titus Dubin M.D.   On: 07/02/2017 10:40    Procedures Procedures (including critical care time)  Medications Ordered in ED Medications  ketorolac (TORADOL) 30 MG/ML injection 30 mg (30 mg Intramuscular Given 07/02/17 1050)     Initial Impression / Assessment and Plan / ED Course  I have reviewed the triage vital signs and the nursing notes.  Pertinent labs & imaging results that were available during my care of the patient were reviewed by me and considered in my medical decision making (see chart for details).    Pt is feeling better.  She asked me to speak to her son on the phone and he mentioned that she was c/o cp.  She told me that she was not and did not want to get her heart checked.  Pt is encouraged to f/u with her pcp and to return if worse.  Final Clinical Impressions(s) / ED Diagnoses   Final diagnoses:  Viral pharyngitis    ED Discharge Orders        Ordered    ibuprofen (ADVIL,MOTRIN) 600 MG tablet  Every 6 hours PRN     07/02/17 1122    predniSONE (STERAPRED UNI-PAK 21 TAB) 10 MG (21) TBPK tablet     07/02/17 1122       Isla Pence, MD 07/02/17 1125

## 2017-07-02 NOTE — ED Notes (Signed)
Pt left without obtain her prescriptions, and discharge assessment. Called pt and asked that she come back for RX pt stated that she would come back.

## 2017-07-02 NOTE — ED Triage Notes (Signed)
Patient states she had dental surgery 6 days ago. Patient states in the past 3-4 days, patient states she has had throat swelling that has gotten progressively worse. Patient is able to swallow her own saliva and is talking.

## 2017-07-04 ENCOUNTER — Encounter (HOSPITAL_COMMUNITY): Payer: Self-pay | Admitting: Emergency Medicine

## 2017-07-04 ENCOUNTER — Inpatient Hospital Stay (HOSPITAL_COMMUNITY)
Admission: EM | Admit: 2017-07-04 | Discharge: 2017-07-08 | DRG: 871 | Disposition: A | Discharge status: Home / Self Care | Payer: Medicare Other | Attending: Internal Medicine | Admitting: Internal Medicine

## 2017-07-04 ENCOUNTER — Other Ambulatory Visit: Payer: Self-pay

## 2017-07-04 ENCOUNTER — Inpatient Hospital Stay (HOSPITAL_COMMUNITY): Payer: Medicare Other | Admitting: Certified Registered"

## 2017-07-04 ENCOUNTER — Encounter (HOSPITAL_COMMUNITY): Payer: Self-pay

## 2017-07-04 ENCOUNTER — Emergency Department (HOSPITAL_COMMUNITY)
Admission: EM | Admit: 2017-07-04 | Discharge: 2017-07-04 | Disposition: A | Discharge status: Home / Self Care | Payer: Medicare Other | Source: Home / Self Care

## 2017-07-04 ENCOUNTER — Inpatient Hospital Stay (HOSPITAL_COMMUNITY): Payer: Medicare Other

## 2017-07-04 ENCOUNTER — Emergency Department (HOSPITAL_COMMUNITY): Payer: Medicare Other

## 2017-07-04 ENCOUNTER — Encounter (HOSPITAL_COMMUNITY)
Admission: EM | Disposition: A | Discharge status: Home / Self Care | Payer: Self-pay | Source: Home / Self Care | Attending: Pulmonary Disease

## 2017-07-04 DIAGNOSIS — I10 Essential (primary) hypertension: Secondary | ICD-10-CM | POA: Diagnosis not present

## 2017-07-04 DIAGNOSIS — M2769 Other endosseous dental implant failure: Secondary | ICD-10-CM | POA: Diagnosis present

## 2017-07-04 DIAGNOSIS — Z87891 Personal history of nicotine dependence: Secondary | ICD-10-CM

## 2017-07-04 DIAGNOSIS — K047 Periapical abscess without sinus: Secondary | ICD-10-CM | POA: Diagnosis not present

## 2017-07-04 DIAGNOSIS — R1310 Dysphagia, unspecified: Secondary | ICD-10-CM | POA: Diagnosis present

## 2017-07-04 DIAGNOSIS — R220 Localized swelling, mass and lump, head: Secondary | ICD-10-CM

## 2017-07-04 DIAGNOSIS — Z965 Presence of tooth-root and mandibular implants: Secondary | ICD-10-CM | POA: Diagnosis present

## 2017-07-04 DIAGNOSIS — Z5321 Procedure and treatment not carried out due to patient leaving prior to being seen by health care provider: Secondary | ICD-10-CM

## 2017-07-04 DIAGNOSIS — Z880 Allergy status to penicillin: Secondary | ICD-10-CM

## 2017-07-04 DIAGNOSIS — A419 Sepsis, unspecified organism: Secondary | ICD-10-CM | POA: Diagnosis not present

## 2017-07-04 DIAGNOSIS — T888XXA Other specified complications of surgical and medical care, not elsewhere classified, initial encounter: Secondary | ICD-10-CM | POA: Diagnosis present

## 2017-07-04 DIAGNOSIS — Z79899 Other long term (current) drug therapy: Secondary | ICD-10-CM | POA: Diagnosis not present

## 2017-07-04 DIAGNOSIS — Z9049 Acquired absence of other specified parts of digestive tract: Secondary | ICD-10-CM | POA: Diagnosis not present

## 2017-07-04 DIAGNOSIS — K140 Glossitis: Secondary | ICD-10-CM | POA: Diagnosis present

## 2017-07-04 DIAGNOSIS — Z885 Allergy status to narcotic agent status: Secondary | ICD-10-CM | POA: Diagnosis not present

## 2017-07-04 DIAGNOSIS — Z01818 Encounter for other preprocedural examination: Secondary | ICD-10-CM | POA: Diagnosis not present

## 2017-07-04 DIAGNOSIS — J029 Acute pharyngitis, unspecified: Secondary | ICD-10-CM | POA: Diagnosis not present

## 2017-07-04 DIAGNOSIS — J95821 Acute postprocedural respiratory failure: Secondary | ICD-10-CM | POA: Diagnosis not present

## 2017-07-04 DIAGNOSIS — K122 Cellulitis and abscess of mouth: Secondary | ICD-10-CM

## 2017-07-04 DIAGNOSIS — M272 Inflammatory conditions of jaws: Secondary | ICD-10-CM | POA: Diagnosis present

## 2017-07-04 DIAGNOSIS — J9811 Atelectasis: Secondary | ICD-10-CM | POA: Diagnosis not present

## 2017-07-04 DIAGNOSIS — Z86010 Personal history of colonic polyps: Secondary | ICD-10-CM | POA: Diagnosis not present

## 2017-07-04 DIAGNOSIS — Z8249 Family history of ischemic heart disease and other diseases of the circulatory system: Secondary | ICD-10-CM | POA: Diagnosis not present

## 2017-07-04 DIAGNOSIS — J9601 Acute respiratory failure with hypoxia: Secondary | ICD-10-CM | POA: Diagnosis not present

## 2017-07-04 DIAGNOSIS — R6520 Severe sepsis without septic shock: Secondary | ICD-10-CM | POA: Diagnosis present

## 2017-07-04 DIAGNOSIS — Z9911 Dependence on respirator [ventilator] status: Secondary | ICD-10-CM | POA: Diagnosis not present

## 2017-07-04 DIAGNOSIS — Y848 Other medical procedures as the cause of abnormal reaction of the patient, or of later complication, without mention of misadventure at the time of the procedure: Secondary | ICD-10-CM | POA: Diagnosis present

## 2017-07-04 DIAGNOSIS — R918 Other nonspecific abnormal finding of lung field: Secondary | ICD-10-CM | POA: Diagnosis not present

## 2017-07-04 DIAGNOSIS — R221 Localized swelling, mass and lump, neck: Secondary | ICD-10-CM | POA: Diagnosis not present

## 2017-07-04 DIAGNOSIS — Z463 Encounter for fitting and adjustment of dental prosthetic device: Secondary | ICD-10-CM | POA: Diagnosis not present

## 2017-07-04 DIAGNOSIS — J9600 Acute respiratory failure, unspecified whether with hypoxia or hypercapnia: Secondary | ICD-10-CM | POA: Diagnosis not present

## 2017-07-04 HISTORY — PX: MANDIBULAR HARDWARE REMOVAL: SHX5205

## 2017-07-04 LAB — COMPREHENSIVE METABOLIC PANEL
ALBUMIN: 3.8 g/dL (ref 3.5–5.0)
ALT: 19 U/L (ref 14–54)
ALT: 21 U/L (ref 14–54)
ANION GAP: 11 (ref 5–15)
ANION GAP: 12 (ref 5–15)
AST: 18 U/L (ref 15–41)
AST: 21 U/L (ref 15–41)
Albumin: 3.7 g/dL (ref 3.5–5.0)
Alkaline Phosphatase: 75 U/L (ref 38–126)
Alkaline Phosphatase: 88 U/L (ref 38–126)
BILIRUBIN TOTAL: 0.8 mg/dL (ref 0.3–1.2)
BUN: 20 mg/dL (ref 6–20)
BUN: 18 mg/dL (ref 6–20)
CHLORIDE: 103 mmol/L (ref 101–111)
CO2: 25 mmol/L (ref 22–32)
CO2: 23 mmol/L (ref 22–32)
Calcium: 9.6 mg/dL (ref 8.9–10.3)
Calcium: 9.2 mg/dL (ref 8.9–10.3)
Chloride: 103 mmol/L (ref 101–111)
Creatinine, Ser: 0.78 mg/dL (ref 0.44–1.00)
Creatinine, Ser: 0.73 mg/dL (ref 0.44–1.00)
GFR calc Af Amer: >60 mL/min (ref >60)
GFR calc non Af Amer: >60 mL/min (ref >60)
GLUCOSE: 145 mg/dL — AB (ref 65–99)
Glucose, Bld: 117 mg/dL — ABNORMAL HIGH (ref 65–99)
POTASSIUM: 4 mmol/L (ref 3.5–5.1)
Potassium: 4.4 mmol/L (ref 3.5–5.1)
SODIUM: 139 mmol/L (ref 135–145)
SODIUM: 138 mmol/L (ref 135–145)
Total Bilirubin: 0.7 mg/dL (ref 0.3–1.2)
Total Protein: 7.4 g/dL (ref 6.5–8.1)
Total Protein: 7.9 g/dL (ref 6.5–8.1)

## 2017-07-04 LAB — GLUCOSE, CAPILLARY: GLUCOSE-CAPILLARY: 208 mg/dL — AB (ref 65–99)

## 2017-07-04 LAB — BLOOD GAS, ARTERIAL
Acid-Base Excess: 0.8 mmol/L (ref 0.0–2.0)
Bicarbonate: 24.6 mmol/L (ref 20.0–28.0)
DRAWN BY: 41977
FIO2: 100
O2 SAT: 98.9 %
PEEP: 5 cmH2O
PH ART: 7.432 (ref 7.350–7.450)
Patient temperature: 98.6
RATE: 16 resp/min
VT: 500 mL
pCO2 arterial: 37.5 mmHg (ref 32.0–48.0)
pO2, Arterial: 191 mmHg — ABNORMAL HIGH (ref 83.0–108.0)

## 2017-07-04 LAB — CBC WITH DIFFERENTIAL/PLATELET
BASOS ABS: 0 10*3/uL (ref 0.0–0.1)
Basophils Absolute: 0 10*3/uL (ref 0.0–0.1)
Basophils Relative: 0 %
Basophils Relative: 0 %
EOS ABS: 0 10*3/uL (ref 0.0–0.7)
EOS PCT: 0 %
Eosinophils Absolute: 0 10*3/uL (ref 0.0–0.7)
Eosinophils Relative: 0 %
HCT: 44.6 % (ref 36.0–46.0)
HCT: 45.2 % (ref 36.0–46.0)
HEMOGLOBIN: 15 g/dL (ref 12.0–15.0)
Hemoglobin: 14.5 g/dL (ref 12.0–15.0)
LYMPHS ABS: 2.6 10*3/uL (ref 0.7–4.0)
LYMPHS ABS: 0.8 10*3/uL (ref 0.7–4.0)
LYMPHS PCT: 3 %
Lymphocytes Relative: 14 %
MCH: 31.1 pg (ref 26.0–34.0)
MCH: 31.8 pg (ref 26.0–34.0)
MCHC: 32.5 g/dL (ref 30.0–36.0)
MCHC: 33.2 g/dL (ref 30.0–36.0)
MCV: 95.7 fL (ref 78.0–100.0)
MCV: 95.8 fL (ref 78.0–100.0)
MONO ABS: 1.5 10*3/uL — AB (ref 0.1–1.0)
Monocytes Absolute: 0.6 10*3/uL (ref 0.1–1.0)
Monocytes Relative: 8 %
Monocytes Relative: 3 %
NEUTROS PCT: 94 %
Neutro Abs: 14.8 10*3/uL — ABNORMAL HIGH (ref 1.7–7.7)
Neutro Abs: 22 10*3/uL — ABNORMAL HIGH (ref 1.7–7.7)
Neutrophils Relative %: 78 %
PLATELETS: 479 10*3/uL — AB (ref 150–400)
PLATELETS: 508 10*3/uL — AB (ref 150–400)
RBC: 4.66 MIL/uL (ref 3.87–5.11)
RBC: 4.72 MIL/uL (ref 3.87–5.11)
RDW: 13.2 % (ref 11.5–15.5)
RDW: 13.2 % (ref 11.5–15.5)
WBC: 18.9 10*3/uL — AB (ref 4.0–10.5)
WBC: 23.3 10*3/uL — AB (ref 4.0–10.5)

## 2017-07-04 LAB — MAGNESIUM: Magnesium: 1.9 mg/dL (ref 1.7–2.4)

## 2017-07-04 LAB — CULTURE, GROUP A STREP (THRC)

## 2017-07-04 LAB — PHOSPHORUS: Phosphorus: 2.3 mg/dL — ABNORMAL LOW (ref 2.5–4.6)

## 2017-07-04 LAB — PROCALCITONIN

## 2017-07-04 SURGERY — REMOVAL, HARDWARE, MANDIBLE
Anesthesia: General | Site: Mouth

## 2017-07-04 MED ORDER — MIDAZOLAM HCL 5 MG/5ML IJ SOLN
INTRAMUSCULAR | Status: DC | PRN
  Administered 2017-07-04 (×2): 1 mg via INTRAVENOUS

## 2017-07-04 MED ORDER — FENTANYL CITRATE (PF) 250 MCG/5ML IJ SOLN
INTRAMUSCULAR | Status: AC
  Filled 2017-07-04: qty 5

## 2017-07-04 MED ORDER — DEXMEDETOMIDINE HCL 200 MCG/2ML IV SOLN
INTRAVENOUS | Status: DC | PRN
  Administered 2017-07-04 (×5): 8 ug via INTRAVENOUS
  Administered 2017-07-04: 20 ug via INTRAVENOUS
  Administered 2017-07-04: 12 ug via INTRAVENOUS
  Administered 2017-07-04: 8 ug via INTRAVENOUS

## 2017-07-04 MED ORDER — MIDAZOLAM HCL 2 MG/2ML IJ SOLN: 1.0000 mg | INTRAMUSCULAR | Status: DC | PRN

## 2017-07-04 MED ORDER — DEXAMETHASONE SODIUM PHOSPHATE 4 MG/ML IJ SOLN
INTRAMUSCULAR | Status: DC | PRN
  Administered 2017-07-04: 10 mg via INTRAVENOUS

## 2017-07-04 MED ORDER — LIDOCAINE 5 % EX OINT
TOPICAL_OINTMENT | Freq: Once | CUTANEOUS | Status: DC
  Filled 2017-07-04: qty 35.44

## 2017-07-04 MED ORDER — BUPIVACAINE-EPINEPHRINE 0.25% -1:200000 IJ SOLN
INTRAMUSCULAR | Status: AC
  Filled 2017-07-04: qty 1

## 2017-07-04 MED ORDER — FENTANYL BOLUS VIA INFUSION
25.0000 ug | INTRAVENOUS | Status: DC | PRN
  Filled 2017-07-04: qty 25

## 2017-07-04 MED ORDER — LIDOCAINE-EPINEPHRINE 1 %-1:100000 IJ SOLN
INTRAMUSCULAR | Status: AC
  Filled 2017-07-04: qty 1

## 2017-07-04 MED ORDER — LACTATED RINGERS IV SOLN
INTRAVENOUS | Status: DC
  Administered 2017-07-04: 17:00:00 via INTRAVENOUS

## 2017-07-04 MED ORDER — LIDOCAINE 2% (20 MG/ML) 5 ML SYRINGE
INTRAMUSCULAR | Status: AC
  Filled 2017-07-04: qty 5

## 2017-07-04 MED ORDER — PROPOFOL 10 MG/ML IV BOLUS
INTRAVENOUS | Status: AC
  Filled 2017-07-04: qty 20

## 2017-07-04 MED ORDER — SODIUM CHLORIDE 0.9 % IV SOLN
1.0000 g | Freq: Three times a day (TID) | INTRAVENOUS | Status: DC
  Administered 2017-07-04 – 2017-07-07 (×9): 1 g via INTRAVENOUS
  Filled 2017-07-04 (×11): qty 1

## 2017-07-04 MED ORDER — DEXMEDETOMIDINE HCL 200 MCG/2ML IV SOLN
0.4000 ug/kg/h | INTRAVENOUS | Status: DC
  Administered 2017-07-04: 0.4 ug/kg/h via INTRAVENOUS
  Filled 2017-07-04: qty 2

## 2017-07-04 MED ORDER — HEPARIN SODIUM (PORCINE) 5000 UNIT/ML IJ SOLN
5000.0000 [IU] | Freq: Three times a day (TID) | INTRAMUSCULAR | Status: DC
  Administered 2017-07-04 – 2017-07-07 (×9): 5000 [IU] via SUBCUTANEOUS
  Filled 2017-07-04 (×10): qty 1

## 2017-07-04 MED ORDER — LACTATED RINGERS IV SOLN
INTRAVENOUS | Status: DC | PRN
  Administered 2017-07-04: 18:00:00 via INTRAVENOUS

## 2017-07-04 MED ORDER — LIDOCAINE HCL 2 % EX GEL
1.0000 "application " | Freq: Once | CUTANEOUS | Status: DC
  Filled 2017-07-04: qty 20

## 2017-07-04 MED ORDER — MIDAZOLAM HCL 2 MG/2ML IJ SOLN
INTRAMUSCULAR | Status: AC
  Filled 2017-07-04: qty 2

## 2017-07-04 MED ORDER — SODIUM CHLORIDE 0.9 % IV SOLN: 250.0000 mL | INTRAVENOUS | Status: DC | PRN

## 2017-07-04 MED ORDER — BUPIVACAINE-EPINEPHRINE 0.25% -1:200000 IJ SOLN
INTRAMUSCULAR | Status: DC | PRN
  Administered 2017-07-04: 10 mL

## 2017-07-04 MED ORDER — PROPOFOL 10 MG/ML IV BOLUS
INTRAVENOUS | Status: DC | PRN
  Administered 2017-07-04: 150 mg via INTRAVENOUS

## 2017-07-04 MED ORDER — EPHEDRINE SULFATE 50 MG/ML IJ SOLN
INTRAMUSCULAR | Status: DC | PRN
  Administered 2017-07-04: 10 mg via INTRAVENOUS

## 2017-07-04 MED ORDER — PANTOPRAZOLE SODIUM 40 MG IV SOLR
40.0000 mg | Freq: Every day | INTRAVENOUS | Status: DC
  Administered 2017-07-04 – 2017-07-05 (×2): 40 mg via INTRAVENOUS
  Filled 2017-07-04 (×2): qty 40

## 2017-07-04 MED ORDER — ASPIRIN 81 MG PO CHEW
324.0000 mg | CHEWABLE_TABLET | ORAL | Status: AC
  Filled 2017-07-04: qty 4

## 2017-07-04 MED ORDER — DEXMEDETOMIDINE HCL IN NACL 200 MCG/50ML IV SOLN
INTRAVENOUS | Status: DC | PRN
  Administered 2017-07-04: .5 ug/kg/h via INTRAVENOUS

## 2017-07-04 MED ORDER — CHLORHEXIDINE GLUCONATE 0.12% ORAL RINSE (MEDLINE KIT)
15.0000 mL | Freq: Two times a day (BID) | OROMUCOSAL | Status: DC
  Administered 2017-07-04 – 2017-07-05 (×2): 15 mL via OROMUCOSAL

## 2017-07-04 MED ORDER — CLINDAMYCIN PHOSPHATE 900 MG/50ML IV SOLN
900.0000 mg | Freq: Once | INTRAVENOUS | Status: AC
  Administered 2017-07-04: 900 mg via INTRAVENOUS
  Filled 2017-07-04: qty 50

## 2017-07-04 MED ORDER — FENTANYL 2500MCG IN NS 250ML (10MCG/ML) PREMIX INFUSION
25.0000 ug/h | INTRAVENOUS | Status: DC
  Administered 2017-07-04: 50 ug/h via INTRAVENOUS
  Administered 2017-07-05: 75 ug/h via INTRAVENOUS
  Filled 2017-07-04 (×2): qty 250

## 2017-07-04 MED ORDER — PHENYLEPHRINE HCL 10 MG/ML IJ SOLN
INTRAMUSCULAR | Status: DC | PRN
  Administered 2017-07-04 (×4): 80 ug via INTRAVENOUS

## 2017-07-04 MED ORDER — GLYCOPYRROLATE 0.2 MG/ML IJ SOLN
INTRAMUSCULAR | Status: DC | PRN
  Administered 2017-07-04: 0.2 mg via INTRAVENOUS

## 2017-07-04 MED ORDER — FENTANYL CITRATE (PF) 100 MCG/2ML IJ SOLN
50.0000 ug | Freq: Once | INTRAMUSCULAR | Status: AC
  Administered 2017-07-04: 50 ug via INTRAVENOUS

## 2017-07-04 MED ORDER — LIDOCAINE-EPINEPHRINE 1 %-1:100000 IJ SOLN
INTRAMUSCULAR | Status: DC | PRN
  Administered 2017-07-04: 10 mL

## 2017-07-04 MED ORDER — ORAL CARE MOUTH RINSE
15.0000 mL | OROMUCOSAL | Status: DC
  Administered 2017-07-04 – 2017-07-05 (×9): 15 mL via OROMUCOSAL

## 2017-07-04 MED ORDER — DEXAMETHASONE SODIUM PHOSPHATE 10 MG/ML IJ SOLN
10.0000 mg | Freq: Once | INTRAMUSCULAR | Status: AC
  Administered 2017-07-04: 10 mg via INTRAVENOUS
  Filled 2017-07-04: qty 1

## 2017-07-04 MED ORDER — SODIUM CHLORIDE 0.9 % IV BOLUS (SEPSIS)
1000.0000 mL | Freq: Once | INTRAVENOUS | Status: AC
  Administered 2017-07-04: 1000 mL via INTRAVENOUS

## 2017-07-04 MED ORDER — ASPIRIN 300 MG RE SUPP
300.0000 mg | RECTAL | Status: AC
  Administered 2017-07-04: 300 mg via RECTAL
  Filled 2017-07-04: qty 1

## 2017-07-04 MED ORDER — IOPAMIDOL (ISOVUE-300) INJECTION 61%
INTRAVENOUS | Status: AC
  Administered 2017-07-04: 75 mL
  Filled 2017-07-04: qty 75

## 2017-07-04 MED ORDER — FENTANYL CITRATE (PF) 100 MCG/2ML IJ SOLN
INTRAMUSCULAR | Status: DC | PRN
  Administered 2017-07-04: 100 ug via INTRAVENOUS

## 2017-07-04 MED ORDER — DEXMEDETOMIDINE HCL IN NACL 200 MCG/50ML IV SOLN
INTRAVENOUS | Status: AC
  Filled 2017-07-04: qty 100

## 2017-07-04 MED ORDER — ROCURONIUM BROMIDE 100 MG/10ML IV SOLN
INTRAVENOUS | Status: DC | PRN
  Administered 2017-07-04: 50 mg via INTRAVENOUS

## 2017-07-04 MED ORDER — OXYMETAZOLINE HCL 0.05 % NA SOLN
NASAL | Status: AC
  Filled 2017-07-04: qty 15

## 2017-07-04 MED ORDER — EPHEDRINE 5 MG/ML INJ
INTRAVENOUS | Status: AC
  Filled 2017-07-04: qty 10

## 2017-07-04 MED ORDER — BACITRACIN ZINC 500 UNIT/GM EX OINT
TOPICAL_OINTMENT | CUTANEOUS | Status: AC
  Filled 2017-07-04: qty 28.35

## 2017-07-04 MED ORDER — 0.9 % SODIUM CHLORIDE (POUR BTL) OPTIME
TOPICAL | Status: DC | PRN
  Administered 2017-07-04: 1000 mL

## 2017-07-04 SURGICAL SUPPLY — 47 items
BLADE SURG 15 STRL LF DISP TIS (BLADE) IMPLANT
BLADE SURG 15 STRL SS (BLADE)
BNDG GAUZE ELAST 4 BULKY (GAUZE/BANDAGES/DRESSINGS) ×2 IMPLANT
CANISTER SUCT 3000ML PPV (MISCELLANEOUS) ×3 IMPLANT
CLEANER TIP ELECTROSURG 2X2 (MISCELLANEOUS) ×3 IMPLANT
DRAPE HALF SHEET 40X57 (DRAPES) IMPLANT
ELECT COATED BLADE 2.86 ST (ELECTRODE) ×3 IMPLANT
ELECT NDL TIP 2.8 STRL (NEEDLE) IMPLANT
ELECT NEEDLE TIP 2.8 STRL (NEEDLE) IMPLANT
ELECT REM PT RETURN 9FT ADLT (ELECTROSURGICAL) ×3
ELECTRODE REM PT RTRN 9FT ADLT (ELECTROSURGICAL) ×1 IMPLANT
GAUZE SPONGE 4X4 12PLY STRL (GAUZE/BANDAGES/DRESSINGS) ×2 IMPLANT
GLOVE BIOGEL M 7.0 STRL (GLOVE) ×3 IMPLANT
GOWN STRL REUS W/ TWL LRG LVL3 (GOWN DISPOSABLE) ×2 IMPLANT
GOWN STRL REUS W/TWL LRG LVL3 (GOWN DISPOSABLE) ×6
KIT BASIN OR (CUSTOM PROCEDURE TRAY) ×3 IMPLANT
KIT ROOM TURNOVER OR (KITS) ×3 IMPLANT
NDL BLUNT 18X1 FOR OR ONLY (NEEDLE) ×1 IMPLANT
NDL HYPO 25GX1X1/2 BEV (NEEDLE) IMPLANT
NEEDLE BLUNT 18X1 FOR OR ONLY (NEEDLE) ×3 IMPLANT
NEEDLE HYPO 25GX1X1/2 BEV (NEEDLE) IMPLANT
NS IRRIG 1000ML POUR BTL (IV SOLUTION) ×3 IMPLANT
PAD ARMBOARD 7.5X6 YLW CONV (MISCELLANEOUS) ×6 IMPLANT
PATTIES SURGICAL .5 X3 (DISPOSABLE) IMPLANT
PENCIL BUTTON HOLSTER BLD 10FT (ELECTRODE) ×3 IMPLANT
SCISSORS WIRE DISP (INSTRUMENTS) ×3 IMPLANT
STAPLER VISISTAT 35W (STAPLE) ×3 IMPLANT
SUCTION FRAZIER TIP 10 FR DISP (SUCTIONS) IMPLANT
SUCTION FRAZIER TIP 8 FR DISP (SUCTIONS)
SUCTION TUBE FRAZIER 8FR DISP (SUCTIONS) IMPLANT
SUT BONE WAX W31G (SUTURE) IMPLANT
SUT CHROMIC 3 0 SH 27 (SUTURE) ×6 IMPLANT
SUT ETHILON 3 0 PS 1 (SUTURE) ×3 IMPLANT
SUT SILK 3 0 (SUTURE)
SUT SILK 3 0 SH 30 (SUTURE) ×3 IMPLANT
SUT SILK 3-0 18XBRD TIE 12 (SUTURE) IMPLANT
SUT STEEL 0 (SUTURE)
SUT STEEL 0 18XMFL TIE 17 (SUTURE) IMPLANT
SUT STEEL 2 (SUTURE) ×3 IMPLANT
SUT VIC AB 3-0 FS2 27 (SUTURE) IMPLANT
SUT VIC AB 4-0 P-3 18X BRD (SUTURE) IMPLANT
SUT VIC AB 4-0 P3 18 (SUTURE)
SUT VIC AB 5-0 P-3 18XBRD (SUTURE) IMPLANT
SUT VIC AB 5-0 P3 18 (SUTURE)
SYR 50ML LL SCALE MARK (SYRINGE) ×3 IMPLANT
TRAY ENT MC OR (CUSTOM PROCEDURE TRAY) ×3 IMPLANT
WATER STERILE IRR 1000ML POUR (IV SOLUTION) ×3 IMPLANT

## 2017-07-04 NOTE — Brief Op Note (Signed)
07/04/2017  7:20 PM  PATIENT:  [name]  77 y.o. female  PRE-OPERATIVE DIAGNOSIS:  s/p dental implant  POST-OPERATIVE DIAGNOSIS:  s/p dental implant  PROCEDURE:  Procedure(s): REMOVAL OF IMPLANT MANDIBLE (N/A)  INCISION AND DRAINAGE SUBMENTAL/SUBLINGUAL ABSCESS/CELLULITIS  SURGEON:  Surgeon(s) and Role:    * Jakoby Melendrez, DMD - Primary   ASSISTANTS: none   ANESTHESIA:   general  EBL:  10cc  BLOOD ADMINISTERED:none  DRAINS: Penrose drain in the anterior cervical region   LOCAL MEDICATIONS USED:  LIDOCAINE   Cultures: anterior mandibular abscess/cellulitis  SPECIMEN:  No Specimen  DISPOSITION OF SPECIMEN:  N/A  COUNTS:  YES  DICTATION: .Dragon Dictation  PLAN OF CARE: admit to ICU  PATIENT DISPOSITION:  ICU - intubated and hemodynamically stable.   Delay start of Pharmacological VTE agent (>24hrs) due to surgical blood loss or risk of bleeding: yes

## 2017-07-04 NOTE — Op Note (Signed)
PATIENT:  Tina Hardy  77 y.o. female  PRE-OPERATIVE DIAGNOSIS:  Bilateral submental/sublingual abscess/cellulitis s/p dental implant  POST-OPERATIVE DIAGNOSIS:  same  PROCEDURE:  Procedure(s): REMOVAL OF IMPLANT MANDIBLE (N/A)  INCISION AND DRAINAGE SUBMENTAL/SUBLINGUAL ABSCESS/CELLULITIS  SURGEON:  Surgeon(s) and Role:    * Jordany Russett, DMD - Primary   ASSISTANTS: none   ANESTHESIA:   general  EBL:  10cc  BLOOD ADMINISTERED:none  DRAINS: Penrose drain in the anterior cervical region   LOCAL MEDICATIONS USED:  LIDOCAINE   Cultures: anterior mandibular abscess/cellulitis cultures  FINDINGS: 1. Greenish/yellow purulence - approx 5cc total  Indications for procedure: Patient is a 77 year old female with recent history of dental implant placement approximately 1 week ago.  She was experiencing increased swelling approximately 2 days ago, to which she reported to the emergency department.  She was sent home on pain medication and antibiotics.  She presented today with increased swelling with increase in difficulty breathing and swallowing.  She was admitted to the intensive care unit for IV antibiotics as well as for airway concerns she presents now for incision and drainage of her mandibular abscess as well as removal of dental implant in the anterior mandible.  Procedure: Patient was identified in the preoperative holding by both anesthesia and the maxillofacial teams.  Health history was reviewed.  Consent was verified.  The patient was brought back to the operating room and placed on table in the supine position.  Standard ASA leads and monitors were placed.  The patient was preoxygenated and sedated.  She was then orally intubated with aid of the fiberoptic scope.  Confirmation of the tube was confirmed.  The tube was then taped and secured by the anesthesia care team.  The patient was then prepped and draped for standard maxillofacial procedure.  The patient  was anesthetized with 10 cc of 1% lidocaine with 1 100,000 epinephrine in the bilateral mandible.  The healing On the implant in the anterior mandible that was removed.  A 301 dental elevator was used to luxate the dental implant.  A rongeur forcep was then utilized to loosen and unscrew the implant.  Following removal there was noted gross purulence coming from the implant site.  Subperiosteal dissection was completed on the facial and lingual aspects in the anterior mandible.  Cultures were obtained and submitted for anaerobic/aerobic culturing.  At this point a 15 blade was then used to make a 1 cm incision in the submental area through the skin and subcutaneous tissues.  Blunt dissection was carried out with a hemostat to explore the submental and sublingual spaces bilaterally.  Dissection was always also carried out into the tongue and base of tongue areas.  There was noted purulence coming from the extraoral site.  At this point the wounds were thoroughly irrigated with copious amounts of saline.  A Penrose drain was placed in the submental area and secured with a single 2-0 nylon suture.  Intraorally the wound margins were reapproximated with interrupted 3-0 chromic gut suture.  The patient was then returned to the anesthesia care team.  She will be transported to the ICU intubated for at least 24 hours due to airway concerns.

## 2017-07-04 NOTE — ED Triage Notes (Signed)
Patient tongue is swollen. Patient state it is difficult for her to swallow pills.

## 2017-07-04 NOTE — ED Notes (Signed)
Per Network engineer patient is at [facility] ED and need take patient out of system

## 2017-07-04 NOTE — Consult Note (Signed)
Reason for Consult: early Buena angina Referring Physician: ED  Tina Hardy is an 77 y.o. female.  HPI: This is a 77 year old female who recently underwent dental implant approximately 2 weeks prior to presentation.  Over the last 4 days prior to admission was having increased mouth pain and swelling at the floor of the mouth.  She was seen in the emergency room on 2/12, had a CT scan which was negative for retropharyngeal fluid.  She was started on oral antibiotics.  Over the following 2 days she developed worsening pain, difficulty swallowing and more swelling.  She returned once again to the emergency room.  Further imaging of soft tissue of the neck was obtained showing fluid collection within the tongue, as well as fluid dissecting posteriorly into the hypopharyngeal area consistent with possible early Ludwig's angina.  She had already been seen by ENT, started on IV antibiotics and, Oral maxillary surgery was consulted for implant removal and I&D. critical care asked to admit.  Currently, pt complains of dysphagia and odynophagia, as well as pain on opening wide. Denies dyspnea.    Past Medical History:  Diagnosis Date  . Colon polyp 07/18/2004   Hyperplastic  . Diverticulosis   . Hepatic hemangioma 07/16/2012  . Liver hemangioma   . Pernicious anemia   . Rectal bleed     Past Surgical History:  Procedure Laterality Date  . APPENDECTOMY  2012   Dr Zella Richer  . APPENDECTOMY    . CHOLECYSTECTOMY  05/30/2012   Procedure: LAPAROSCOPIC CHOLECYSTECTOMY WITH INTRAOPERATIVE CHOLANGIOGRAM;  Surgeon: Gayland Curry, MD,FACS;  Location: [facility];  Service: General;  Laterality: N/A;  . DENTAL SURGERY    . HERNIA REPAIR    . INGUINAL HERNIA REPAIR  12/24/06   left; laparoscopic  . KNEE SURGERY     right  . UMBILICAL HERNIA REPAIR  12/24/06   with reduction of sigmoid colon which was incarcerated    Family History  Problem Relation Age of Onset  . Heart failure Mother   . Crohn's  disease Other        neice  . Crohn's disease Other        nephew  . Colon cancer Neg Hx     Social History:  reports that she quit smoking about 44 years ago. Her smoking use included cigarettes. she has never used smokeless tobacco. She reports that she drinks alcohol. She reports that she uses drugs. Drug: Marijuana.  Allergies:  Allergies  Allergen Reactions  . Codeine     REACTION: questionable  . Codeine Other (See Comments)    Unknown reaction   . Penicillins     REACTION: questionable  . Penicillins Other (See Comments)    Unknown reaction  Has patient had a PCN reaction causing immediate rash, facial/tongue/throat swelling, SOB or lightheadedness with hypotension: n/a Has patient had a PCN reaction causing severe rash involving mucus membranes or skin necrosis: n/a Has patient had a PCN reaction that required hospitalization: n/a Has patient had a PCN reaction occurring within the last 10 years: n/a If all of the above answers are "NO", then may proceed with Cephalosporin use.     Medications: I have reviewed the patient's current medications.  Results for orders placed or performed during the hospital encounter of 07/04/17 (from the past 48 hour(s))  CBC with Differential     Status: Abnormal   Collection Time: 07/04/17 10:37 AM  Result Value Ref Range   WBC 18.9 (H) 4.0 - 10.5 K/uL  RBC 4.66 3.87 - 5.11 MIL/uL   Hemoglobin 14.5 12.0 - 15.0 g/dL   HCT 44.6 36.0 - 46.0 %   MCV 95.7 78.0 - 100.0 fL   MCH 31.1 26.0 - 34.0 pg   MCHC 32.5 30.0 - 36.0 g/dL   RDW 13.2 11.5 - 15.5 %   Platelets 479 (H) 150 - 400 K/uL   Neutrophils Relative % 78 %   Neutro Abs 14.8 (H) 1.7 - 7.7 K/uL   Lymphocytes Relative 14 %   Lymphs Abs 2.6 0.7 - 4.0 K/uL   Monocytes Relative 8 %   Monocytes Absolute 1.5 (H) 0.1 - 1.0 K/uL   Eosinophils Relative 0 %   Eosinophils Absolute 0.0 0.0 - 0.7 K/uL   Basophils Relative 0 %   Basophils Absolute 0.0 0.0 - 0.1 K/uL    Comment:  Performed at [facility] [address], [place], [place] [address]  Comprehensive metabolic panel     Status: Abnormal   Collection Time: 07/04/17 10:37 AM  Result Value Ref Range   Sodium 139 135 - 145 mmol/L   Potassium 4.4 3.5 - 5.1 mmol/L   Chloride 103 101 - 111 mmol/L   CO2 25 22 - 32 mmol/L   Glucose, Bld 117 (H) 65 - 99 mg/dL   BUN 20 6 - 20 mg/dL   Creatinine, Ser 0.78 0.44 - 1.00 mg/dL   Calcium 9.6 8.9 - 10.3 mg/dL   Total Protein 7.4 6.5 - 8.1 g/dL   Albumin 3.7 3.5 - 5.0 g/dL   AST 18 15 - 41 U/L   ALT 19 14 - 54 U/L   Alkaline Phosphatase 75 38 - 126 U/L   Total Bilirubin 0.7 0.3 - 1.2 mg/dL   GFR calc non Af Amer >60 >60 mL/min   GFR calc Af Amer >60 >60 mL/min    Comment: (NOTE) The eGFR has been calculated using the CKD EPI equation. This calculation has not been validated in all clinical situations. eGFR's persistently <60 mL/min signify possible Chronic Kidney Disease.    Anion gap 11 5 - 15    Comment: Performed at [facility] [address] [place], [place] [address]  Comprehensive metabolic panel     Status: Abnormal   Collection Time: 07/04/17  2:05 PM  Result Value Ref Range   Sodium 138 135 - 145 mmol/L   Potassium 4.0 3.5 - 5.1 mmol/L   Chloride 103 101 - 111 mmol/L   CO2 23 22 - 32 mmol/L   Glucose, Bld 145 (H) 65 - 99 mg/dL   BUN 18 6 - 20 mg/dL   Creatinine, Ser 0.73 0.44 - 1.00 mg/dL   Calcium 9.2 8.9 - 10.3 mg/dL   Total Protein 7.9 6.5 - 8.1 g/dL   Albumin 3.8 3.5 - 5.0 g/dL   AST 21 15 - 41 U/L   ALT 21 14 - 54 U/L   Alkaline Phosphatase 88 38 - 126 U/L   Total Bilirubin 0.8 0.3 - 1.2 mg/dL   GFR calc non Af Amer >60 >60 mL/min   GFR calc Af Amer >60 >60 mL/min    Comment: (NOTE) The eGFR has been calculated using the CKD EPI equation. This calculation has not been validated in all clinical situations. eGFR's persistently <60 mL/min signify possible Chronic Kidney Disease.    Anion gap 12 5 - 15    Comment:  Performed at [facility] [address], [place],  [address]  Magnesium     Status: None   Collection Time: 07/04/17  2:05 PM  Result Value Ref Range   Magnesium 1.9 1.7 - 2.4 mg/dL    Comment: Performed at [facility], [place] [address] [place], [place] [address]  Phosphorus     Status: Abnormal   Collection Time: 07/04/17  2:05 PM  Result Value Ref Range   Phosphorus 2.3 (L) 2.5 - 4.6 mg/dL    Comment: Performed at [facility] [address], [place], [place] [address]  Procalcitonin     Status: None   Collection Time: 07/04/17  2:05 PM  Result Value Ref Range   Procalcitonin <0.10 ng/mL    Comment:        Interpretation: PCT (Procalcitonin) <= 0.5 ng/mL: Systemic infection (sepsis) is not likely. Local bacterial infection is possible. (NOTE)       Sepsis PCT Algorithm           Lower Respiratory Tract                                      Infection PCT Algorithm    ----------------------------     ----------------------------         PCT < 0.25 ng/mL                PCT < 0.10 ng/mL         Strongly encourage             Strongly discourage   discontinuation of antibiotics    initiation of antibiotics    ----------------------------     -----------------------------       PCT 0.25 - 0.50 ng/mL            PCT 0.10 - 0.25 ng/mL               OR       >80% decrease in PCT            Discourage initiation of                                            antibiotics      Encourage discontinuation           of antibiotics    ----------------------------     -----------------------------         PCT >= 0.50 ng/mL              PCT 0.26 - 0.50 ng/mL               AND        <80% decrease in PCT             Encourage initiation of                                             antibiotics       Encourage continuation           of antibiotics    ----------------------------     -----------------------------        PCT >= 0.50 ng/mL  PCT > 0.50  ng/mL               AND         increase in PCT                  Strongly encourage                                      initiation of antibiotics    Strongly encourage escalation           of antibiotics                                     -----------------------------                                           PCT <= 0.25 ng/mL                                                 OR                                        > 80% decrease in PCT                                     Discontinue / Do not initiate                                             antibiotics Performed at [facility], [address], [place], [place] [address]   CBC WITH DIFFERENTIAL     Status: Abnormal   Collection Time: 07/04/17  2:05 PM  Result Value Ref Range   WBC 23.3 (H) 4.0 - 10.5 K/uL   RBC 4.72 3.87 - 5.11 MIL/uL   Hemoglobin 15.0 12.0 - 15.0 g/dL   HCT 45.2 36.0 - 46.0 %   MCV 95.8 78.0 - 100.0 fL   MCH 31.8 26.0 - 34.0 pg   MCHC 33.2 30.0 - 36.0 g/dL   RDW 13.2 11.5 - 15.5 %   Platelets 508 (H) 150 - 400 K/uL   Neutrophils Relative % 94 %   Neutro Abs 22.0 (H) 1.7 - 7.7 K/uL   Lymphocytes Relative 3 %   Lymphs Abs 0.8 0.7 - 4.0 K/uL   Monocytes Relative 3 %   Monocytes Absolute 0.6 0.1 - 1.0 K/uL   Eosinophils Relative 0 %   Eosinophils Absolute 0.0 0.0 - 0.7 K/uL   Basophils Relative 0 %   Basophils Absolute 0.0 0.0 - 0.1 K/uL    Comment: Performed at [facility] [address], [place], [place] [address]  Aerobic/Anaerobic Culture (surgical/deep wound)     Status: None (Preliminary result)   Collection Time: 07/04/17  6:43 PM  Result Value Ref  Range   Specimen Description      ABSCESS Performed at [facility], [place] [address] [place], [place] [address]    Special Requests ANTERIOR MANDIBULAR    Gram Stain PENDING    Culture PENDING    Report Status PENDING   Blood gas, arterial     Status: Abnormal   Collection Time: 07/04/17  8:00 PM  Result Value Ref Range    FIO2 100.00    Delivery systems VENTILATOR    Mode PRESSURE REGULATED VOLUME CONTROL    VT 500 mL   LHR 16 resp/min   Peep/cpap 5.0 cm H20   pH, Arterial 7.432 7.350 - 7.450   pCO2 arterial 37.5 32.0 - 48.0 mmHg   pO2, Arterial 191 (H) 83.0 - 108.0 mmHg   Bicarbonate 24.6 20.0 - 28.0 mmol/L   Acid-Base Excess 0.8 0.0 - 2.0 mmol/L   O2 Saturation 98.9 %   Patient temperature 98.6    Collection site RIGHT RADIAL    Drawn by [phone]    Sample type ARTERIAL DRAW    Allens test (pass/fail) PASS PASS  Glucose, capillary     Status: Abnormal   Collection Time: 07/04/17  8:29 PM  Result Value Ref Range   Glucose-Capillary 208 (H) 65 - 99 mg/dL   Comment 1 Capillary Specimen    Comment 2 Notify RN     Ct Soft Tissue Neck W Contrast  Result Date: 07/04/2017 CLINICAL DATA:  Increasing pain and swelling in the floor of the mouth. EXAM: CT NECK WITH CONTRAST TECHNIQUE: Multidetector CT imaging of the neck was performed using the standard protocol following the bolus administration of intravenous contrast. CONTRAST:  66m ISOVUE-300 IOPAMIDOL (ISOVUE-300) INJECTION 61% COMPARISON:  07/02/2017. FINDINGS: Pharynx and larynx: There is complex enhancement within the tongue, related to penetration of a dental implant through the posterior cortex of the mandible. Centrally, there is a 2 cm enhancing lesion involving the anterior most tongue. Behind the mandible, there is a subperiosteal collection on the RIGHT, 5 x 10 x 12 mm cross-section. There is ill-defined enhancement more posteriorly within the tongue, 6 x 11 mm collection as seen on sagittal series 5, image 54. There is extensive fluid dissecting posteriorly through the posterior tongue, in a submucosal location involving the RIGHT greater than LEFT valleculae and epiglottis. Fluid extends downward along the RIGHT aryepiglottic fold. There is fluid and edema in the sublingual space, and submandibular space, greater on the RIGHT. Salivary glands: No  inflammation, mass, or stone. Thyroid: Normal. Lymph nodes: Reactive level 1 and level 2 adenopathy, greater on the RIGHT. Vascular: Atherosclerosis.  No vascular thrombosis. Limited intracranial: Negative. Visualized orbits: Negative. Mastoids and visualized paranasal sinuses: Clear. Skeleton: No acute or aggressive process.  Spondylosis. Upper chest: Negative. Other: None. IMPRESSION: Complex inflammatory process related to malposition of a RIGHT mandibular dental implant which has penetrated the posterior cortex of the mandible. Subperiosteal abscess along the RIGHT mandible, enhancing fluid collections within the tongue, and fluid dissecting posteriorly in a submucosal fashion into the hypopharyngeal area, all consistent with Ludwig's angina. Patient is at risk for airway compromise should further edema develop. See discussion above. These results were called by telephone at the time of interpretation on 07/04/2017 at 1:02 pm to Dr. NLucia Gaskins who verbally acknowledged these results. He reports that the plan is for removal of dental implant with admission for close airway observation. Electronically Signed   By: JStaci RighterM.D.   On: 07/04/2017 13:10   Dg  Chest Port 1 View  Result Date: 07/04/2017 CLINICAL DATA:  77 year old female postoperative day zero status post incision and drainage of sublingual abscess/cellulitis. Intubated. EXAM: PORTABLE CHEST 1 VIEW COMPARISON:  Chest radiograph 11/14/2010. FINDINGS: Portable AP semi upright view at 1934 hrs. Endotracheal tube tip at the level the clavicles. Mediastinal contours remain normal. Mildly low lung volumes. Patchy bibasilar opacity most resembling atelectasis, greater on the left. No pneumothorax, pulmonary edema or definite pleural effusion. Negative visible bowel gas pattern. Right upper quadrant cholecystectomy clips are new since 2012. IMPRESSION: 1. Endotracheal tube tip in good position. 2. Low lung volumes with left greater than right lung base  atelectasis. Electronically Signed   By: Genevie Ann M.D.   On: 07/04/2017 19:58    ROS: other than HPI, neg Blood pressure (!) 204/104, pulse (!) 107, temperature 98.6 F (37 C), temperature source Oral, resp. rate (!) 22, height 5' 5"  (1.651 m), weight 70.2 kg (154 lb 12.2 oz), SpO2 99 %. Physical Exam: Gen: sitting upright in bed, uncomfortable, but NAD HEENT: mod-sev submental edema with ttp. Mod trismus, sev FOM edema with elevated tongue, difficult to view posterior oropharynx. Implant visible in anterior mandible.   Assessment/Plan: Submental, bil sublingual abscess/cellulitis 2/2 malpositioned dental implant (perforation of lingual cortex). Plan for OR now for I&D and surgical removal of dental implant. Likely will require extraoral drainage. Discussed risk/benefits with patient with family present. Consent signed/witnessed.  Following OR, will likely remain intubated to ICU due to compromised airway, IV antibiotics and steroids to limit airway edema postop.  Michael Litter, DMD Oral & Maxillofacial Surgery 07/04/2017, 5:15PM

## 2017-07-04 NOTE — ED Notes (Signed)
Consulting provider bedside, states dental implant was placed incorrectly and will need to come out at some point. Pt aware of plan.

## 2017-07-04 NOTE — ED Notes (Signed)
Consulting provider bedside for evaluation

## 2017-07-04 NOTE — ED Provider Notes (Addendum)
[facility] EMERGENCY DEPARTMENT Provider Note   CSN: [id number] Arrival date & time: 07/04/17  9798     History   Chief Complaint No chief complaint on file.   HPI Tina Hardy is a 77 y.o. female.  HPI   77 year old female here with tongue swelling and difficulty swallowing.  The patient recently had dental work and had a post placed in her lower central incisor.  She was seen on 2/12 and treated for pharyngitis with a dose of steroids.  Since then, the patient states she has had progressively worsening tongue swelling and protrusion.  She is had difficulty swallowing and can now no longer swallow.  She is been unable to take her antibiotics or medications.  She also noticed fullness in her anterior neck.  She denies any shortness of breath at rest, but does feel slightly short of breath when she lays flat.  No history of previous ENT issues.  Denies any fevers but has had some chills.  No coughing.  No abdominal pain.  Past Medical History:  Diagnosis Date  . Colon polyp 07/18/2004   Hyperplastic  . Diverticulosis   . Hepatic hemangioma 07/16/2012  . Liver hemangioma   . Pernicious anemia   . Rectal bleed     Patient Active Problem List   Diagnosis Date Noted  . 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 Chronic abdominal pain with cramping/bloating 02/04/2013  . Stress at home 02/04/2013  . Rectal bleeding 01/05/2013  . Hepatic hemangioma 07/16/2012  . DIVERTICULOSIS OF COLON 06/22/2008  . HEMOCCULT POSITIVE STOOL 06/22/2008  . COLONIC POLYPS, HYPERPLASTIC, HX OF 06/22/2008    Past Surgical History:  Procedure Laterality Date  . APPENDECTOMY  2012   Dr Zella Richer  . APPENDECTOMY    . CHOLECYSTECTOMY  05/30/2012   Procedure: LAPAROSCOPIC CHOLECYSTECTOMY WITH INTRAOPERATIVE CHOLANGIOGRAM;  Surgeon: Gayland Curry, MD,FACS;  Location: [facility];  Service: General;  Laterality: N/A;  . DENTAL SURGERY    . HERNIA REPAIR    . INGUINAL HERNIA REPAIR  12/24/06   left; laparoscopic  . KNEE SURGERY     right  . UMBILICAL HERNIA REPAIR  12/24/06   with reduction of sigmoid colon which was incarcerated    OB History    No data available       Home Medications    Prior to Admission medications   Medication Sig Start Date End Date Taking? Authorizing Provider  ibuprofen (ADVIL,MOTRIN) 600 MG tablet Take 1 tablet (600 mg total) by mouth every 6 (six) hours as needed. 07/02/17  Yes Isla Pence, MD  metroNIDAZOLE (FLAGYL) 500 MG tablet Take one po BID x 10 days 01/05/13  Yes Zehr, Laban Emperor, PA-C  Multiple Vitamin (MULTIVITAMIN) tablet Take 2 tablets by mouth daily.    Yes [provider]  NON FORMULARY Paul bragg apple cider vin with the mother   Yes [provider]  predniSONE (STERAPRED UNI-PAK 21 TAB) 10 MG (21) TBPK tablet Take 6 tabs by mouth daily  for 2 days, then 5 tabs for 2 days, then 4 tabs for 2 days, then 3 tabs for 2 days, 2 tabs for 2 days, then 1 tab by mouth daily for 2 days 07/02/17  Yes Isla Pence, MD  zolpidem (AMBIEN) 5 MG tablet Take 5 mg by mouth at bedtime. 06/06/17  Yes [provider]  colesevelam (WELCHOL) 625 MG tablet Take 3 tablets (1,875 mg total) by mouth daily. Patient not taking: Reported on 07/04/2017 01/13/13  Willia Craze, NP  diphenoxylate-atropine (LOMOTIL) 2.5-0.025 MG per tablet Take 1 tablet by mouth 4 (four) times daily as needed for diarrhea or loose stools. Patient not taking: Reported on 07/04/2017 01/22/13   Zehr, Laban Emperor, PA-C  hydrocortisone (ANUSOL-HC) 25 MG suppository Place 1 suppository (25 mg total) rectally 2 (two) times daily. Patient not taking: Reported on 07/04/2017 01/22/13   Zehr, Laban Emperor, PA-C  hyoscyamine (LEVSIN SL) 0.125 MG SL tablet Place 1 tablet (0.125 mg total) under the tongue every 4 (four) hours as needed for cramping. Patient not taking: Reported on 07/04/2017 01/23/13   Willia Craze, NP    Family History Family History  Problem Relation Age  of Onset  . Heart failure Mother   . Crohn's disease Other        neice  . Crohn's disease Other        nephew  . Colon cancer Neg Hx     Social History Social History   Tobacco Use  . Smoking status: Former Smoker    Types: Cigarettes    Last attempt to quit: 12/04/1972    Years since quitting: 44.6  . Smokeless tobacco: Never Used  Substance Use Topics  . Alcohol use: Yes    Comment: rarely  . Drug use: Yes    Types: Marijuana    Comment: "I will only tell you off the record"     Allergies   Codeine; Codeine; Penicillins; and Penicillins   Review of Systems Review of Systems  Constitutional: Positive for chills. Negative for fever.  HENT: Positive for sore throat and trouble swallowing. Negative for congestion and rhinorrhea.   Eyes: Negative for visual disturbance.  Respiratory: Negative for cough, shortness of breath and wheezing.   Cardiovascular: Negative for chest pain and leg swelling.  Gastrointestinal: Negative for abdominal pain, diarrhea, nausea and vomiting.  Genitourinary: Negative for dysuria, flank pain, vaginal bleeding and vaginal discharge.  Musculoskeletal: Negative for neck pain.  Skin: Negative for rash.  Allergic/Immunologic: Negative for immunocompromised state.  Neurological: Negative for syncope and headaches.  Hematological: Does not bruise/bleed easily.  All other systems reviewed and are negative.    Physical Exam Updated Vital Signs BP (!) 190/90   Pulse 97   Temp 98.1 F (36.7 C) (Oral)   Resp 17   Ht 5\' 5"  (1.651 m)   Wt 72.6 kg (160 lb)   SpO2 96%   BMI 26.63 kg/m   Physical Exam  Constitutional: She is oriented to person, place, and time. She appears well-developed and well-nourished. No distress.  HENT:  Head: Normocephalic.  Market sublingual swelling with tongue elevation and slight protrusion.  Tongue mobility is significantly limited.  Mild induration of sublingual space.  There is moderate gingival edema  surrounding recently placed post of the central incisor on the mandible.  Moderate submandibular swelling and tenderness.  No stridor.  Slight pooling of secretions.  Eyes: Conjunctivae are normal.  Neck: Neck supple.  Cardiovascular: Normal rate, regular rhythm and normal heart sounds. Exam reveals no friction rub.  No murmur heard. Pulmonary/Chest: Effort normal and breath sounds normal. No respiratory distress. She has no wheezes. She has no rales.  Abdominal: She exhibits no distension.  Musculoskeletal: She exhibits no edema.  Neurological: She is alert and oriented to person, place, and time. She exhibits normal muscle tone.  Skin: Skin is warm. Capillary refill takes less than 2 seconds.  Psychiatric: She has a normal mood and affect.  Nursing note and vitals  reviewed.         ED Treatments / Results  Labs (all labs ordered are listed, but only abnormal results are displayed) Labs Reviewed  CBC WITH DIFFERENTIAL/PLATELET - Abnormal; Notable for the following components:      Result Value   WBC 18.9 (*)    Platelets 479 (*)    Neutro Abs 14.8 (*)    Monocytes Absolute 1.5 (*)    All other components within normal limits  COMPREHENSIVE METABOLIC PANEL - Abnormal; Notable for the following components:   Glucose, Bld 117 (*)    All other components within normal limits  COMPREHENSIVE METABOLIC PANEL - Abnormal; Notable for the following components:   Glucose, Bld 145 (*)    All other components within normal limits  PHOSPHORUS - Abnormal; Notable for the following components:   Phosphorus 2.3 (*)    All other components within normal limits  CBC WITH DIFFERENTIAL/PLATELET - Abnormal; Notable for the following components:   WBC 23.3 (*)    Platelets 508 (*)    Neutro Abs 22.0 (*)    All other components within normal limits  CULTURE, BLOOD (ROUTINE X 2)  CULTURE, BLOOD (ROUTINE X 2)  MAGNESIUM  CBC  PROCALCITONIN    EKG  EKG Interpretation None        Radiology Ct Soft Tissue Neck W Contrast  Result Date: 07/04/2017 CLINICAL DATA:  Increasing pain and swelling in the floor of the mouth. EXAM: CT NECK WITH CONTRAST TECHNIQUE: Multidetector CT imaging of the neck was performed using the standard protocol following the bolus administration of intravenous contrast. CONTRAST:  43mL ISOVUE-300 IOPAMIDOL (ISOVUE-300) INJECTION 61% COMPARISON:  07/02/2017. FINDINGS: Pharynx and larynx: There is complex enhancement within the tongue, related to penetration of a dental implant through the posterior cortex of the mandible. Centrally, there is a 2 cm enhancing lesion involving the anterior most tongue. Behind the mandible, there is a subperiosteal collection on the RIGHT, 5 x 10 x 12 mm cross-section. There is ill-defined enhancement more posteriorly within the tongue, 6 x 11 mm collection as seen on sagittal series 5, image 54. There is extensive fluid dissecting posteriorly through the posterior tongue, in a submucosal location involving the RIGHT greater than LEFT valleculae and epiglottis. Fluid extends downward along the RIGHT aryepiglottic fold. There is fluid and edema in the sublingual space, and submandibular space, greater on the RIGHT. Salivary glands: No inflammation, mass, or stone. Thyroid: Normal. Lymph nodes: Reactive level 1 and level 2 adenopathy, greater on the RIGHT. Vascular: Atherosclerosis.  No vascular thrombosis. Limited intracranial: Negative. Visualized orbits: Negative. Mastoids and visualized paranasal sinuses: Clear. Skeleton: No acute or aggressive process.  Spondylosis. Upper chest: Negative. Other: None. IMPRESSION: Complex inflammatory process related to malposition of a RIGHT mandibular dental implant which has penetrated the posterior cortex of the mandible. Subperiosteal abscess along the RIGHT mandible, enhancing fluid collections within the tongue, and fluid dissecting posteriorly in a submucosal fashion into the hypopharyngeal  area, all consistent with Ludwig's angina. Patient is at risk for airway compromise should further edema develop. See discussion above. These results were called by telephone at the time of interpretation on 07/04/2017 at 1:02 pm to Dr. Lucia Gaskins, who verbally acknowledged these results. He reports that the plan is for removal of dental implant with admission for close airway observation. Electronically Signed   By: Staci Righter M.D.   On: 07/04/2017 13:10    Procedures .Critical Care Performed by: Duffy Bruce, MD Authorized by: Duffy Bruce,  MD   Critical care provider statement:    Critical care time (minutes):  80   Critical care time was exclusive of:  Separately billable procedures and treating other patients and teaching time   Critical care was necessary to treat or prevent imminent or life-threatening deterioration of the following conditions:  Sepsis and respiratory failure   Critical care was time spent personally by me on the following activities:  Development of treatment plan with patient or surrogate, discussions with consultants, evaluation of patient's response to treatment, examination of patient, obtaining history from patient or surrogate, ordering and performing treatments and interventions, ordering and review of laboratory studies, ordering and review of radiographic studies, pulse oximetry, re-evaluation of patient's condition and review of old charts   I assumed direction of critical care for this patient from another provider in my specialty: no      (including critical care time)  Medications Ordered in ED Medications  lidocaine (XYLOCAINE) 2 % jelly 1 application (1 application Topical Not Given 07/04/17 1429)  lidocaine (XYLOCAINE) 5 % ointment ( Topical Not Given 07/04/17 1431)  0.9 %  sodium chloride infusion (not administered)  heparin injection 5,000 Units (5,000 Units Subcutaneous Given 07/04/17 1447)  pantoprazole (PROTONIX) injection 40 mg (not  administered)  meropenem (MERREM) 1 g in sodium chloride 0.9 % 100 mL IVPB (1 g Intravenous New Bag/Given 07/04/17 1447)  dexamethasone (DECADRON) injection 10 mg (10 mg Intravenous Given 07/04/17 1051)  clindamycin (CLEOCIN) IVPB 900 mg (0 mg Intravenous Stopped 07/04/17 1126)  sodium chloride 0.9 % bolus 1,000 mL (0 mLs Intravenous Stopped 07/04/17 1214)  iopamidol (ISOVUE-300) 61 % injection (75 mLs  Contrast Given 07/04/17 1202)  aspirin chewable tablet 324 mg ( Oral See Alternative 07/04/17 1448)    Or  aspirin suppository 300 mg (300 mg Rectal Given 07/04/17 1448)     Initial Impression / Assessment and Plan / ED Course  I have reviewed the triage vital signs and the nursing notes.  Pertinent labs & imaging results that were available during my care of the patient were reviewed by me and considered in my medical decision making (see chart for details).     77 year old female here with sublingual swelling and difficulty swallowing.  On my exam, concern for possible early Ludwig's angina.  The patient has difficulty tolerating her secretions.  There is no stridor but she does become dyspneic with lying flat.  Concern for possible impending airway compromise.  ENT consulted emergently and will start IV clindamycin (allergy to penicillin, has not had cephalosporins prior - discussed with pharmacy who feels [facility] is best option) and IV Decadron.  Discussed the possibility of needing intubation with the patient who is in agreement.  Will plan to have ENT at bedside to determine if airway needed as well as to provide assistance.  Dr. Lucia Gaskins has evaluated, agrees airway is protected at this time. Recommends CT scan and feels safe with pt going to CT scanner. IV ABX given. Pt tolerating secretions. Based on our review of prior CT scans, it seems that dental implant violates posterior mandibular cortex and enters the sublingual space - suspect this is etiology for her infection.  I have had long  discussions with ENT Dr. Lucia Gaskins, as well as Dr. Mancel Parsons, and Dr. Nelda Marseille. Dr. Mancel Parsons to take pt to [place], with Dr. Lucia Gaskins available and Dr. Nelda Marseille will admit. Dr. Nelda Marseille has discussed case/plan of care with Dr. Mancel Parsons and feels comfortable managing at [facility]. Patient serially examined and  remains stable, airway protected. Anesthesia has also been consulted and has evaluated the pt, will be aware and ready if needed. Pt updated on plan of care. I also had a discussion with her son and boyfriend, at her request. Admit to ICU.  Final Clinical Impressions(s) / ED Diagnoses   Final diagnoses:  Ludwig's angina    ED Discharge Orders    None       Duffy Bruce, MD 07/04/17 1605    Duffy Bruce, MD 07/04/17 2018

## 2017-07-04 NOTE — Anesthesia Procedure Notes (Signed)
Procedure Name: Awake intubation Date/Time: 07/04/2017 6:29 PM Performed by: Audry Pili, MD Pre-anesthesia Checklist: Patient identified, Emergency Drugs available, Suction available and Patient being monitored Patient Re-evaluated:Patient Re-evaluated prior to induction Oxygen Delivery Method: Circle system utilized Preoxygenation: Pre-oxygenation with 100% oxygen Tube type: Oral Tube size: 7.0 mm Number of attempts: 5 or more Airway Equipment and Method: Fiberoptic brochoscope Placement Confirmation: ETT inserted through vocal cords under direct vision,  positive ETCO2 and breath sounds checked- equal and bilateral Secured at: 22 cm Tube secured with: Tape Dental Injury: Teeth and Oropharynx as per pre-operative assessment  Difficulty Due To: Difficulty was anticipated and Difficult Airway-  due to edematous airway Comments: Patient sedated with medications as documented. Airway anesthetized several times with aerosolized local anesthetic. Initial looks with glidescope unable to visualize any recognizable structures. Able to intubate over fiberoptic scope orally after several attempts. Airway secured, patient induced with propofol following confirmation and securing of ETT placement.

## 2017-07-04 NOTE — Progress Notes (Signed)
Pharmacy Antibiotic Note  Shaine Mount is a 77 y.o. female admitted on 07/04/2017 with sepsis secondary to a tooth abscess.  Pharmacy has been consulted for meropenem dosing. Has received one dose of clindamycin in the ED.  WBC elevated at 18.9, tachycardic/hypertensive, afebrile, imaging shows fluid collections. Plan to go to the OR for drainage, blood cultures sent. Pt does have a hx of PCN allergy (unknown reaction), however low cross-reactivity found with carbapenems.  Plan: Meropenem 1g q8h Follow clinical status, cultures, LOT  Height: 5\' 5"  (165.1 cm) Weight: 160 lb (72.6 kg) IBW/kg (Calculated) : 57  Temp (24hrs), Avg:98.3 F (36.8 C), Min:98.1 F (36.7 C), Max:98.4 F (36.9 C)  Recent Labs  Lab 07/04/17 1037  WBC 18.9*  CREATININE 0.78    Estimated Creatinine Clearance: 60.6 mL/min (by C-G formula based on SCr of 0.78 mg/dL).    Antimicrobials this admission: Meropenem 2/14>> Clindamycin 2/14 x1  Microbiology results: Blood Cx 2/14>> sent  Thank you for allowing pharmacy to be a part of this patient's care.   Patterson Hammersmith PharmD PGY1 Pharmacy Practice Resident 07/04/2017 2:18 PM Phone: [phone]

## 2017-07-04 NOTE — H&P (Signed)
PULMONARY / CRITICAL CARE MEDICINE   Name: Tina Hardy MRN: [id number] DOB: [birth year]    ADMISSION DATE:  07/04/2017 CONSULTATION DATE: 2/14  REFERRING MD:  Ellender Hose  CHIEF COMPLAINT:  sepsis  HISTORY OF PRESENT ILLNESS:   This is a 77 year old female who recently underwent dental implant approximately 2 weeks prior to presentation.  Over the last 4 days prior to admission was having increased mouth pain and swelling at the floor of the mouth.  She was seen in the emergency room on 2/12, had a CT scan which was negative for retropharyngeal fluid.  She was started on oral antibiotics.  Over the following 2 days she developed worsening pain, difficulty swallowing and more swelling.  She returned once again to the emergency room, a repeat CT scan was obtained showing the implant to protrude through the back cortex of the mandible with early abscess formation.  Further imaging of soft tissue of the neck was obtained showing fluid collection within the tongue, as well as fluid dissecting posteriorly into the hypopharyngeal area consistent with Ludwig's angina.  She had already been seen by ENT, started on IV antibiotics and, Oral maxillary surgery was consulted critical care asked to admit.    PAST MEDICAL HISTORY :  She  has a past medical history of Colon polyp (07/18/2004), Diverticulosis, Hepatic hemangioma (07/16/2012), Liver hemangioma, Pernicious anemia, and Rectal bleed.  PAST SURGICAL HISTORY: She  has a past surgical history that includes Appendectomy (2012); Umbilical hernia repair (12/24/06); Knee surgery; Inguinal hernia repair (12/24/06); Cholecystectomy (05/30/2012); Dental surgery; Hernia repair; and Appendectomy.  Allergies  Allergen Reactions  . Codeine     REACTION: questionable  . Codeine Other (See Comments)    Unknown reaction   . Penicillins     REACTION: questionable  . Penicillins Other (See Comments)    Unknown reaction  Has patient had a PCN reaction causing  immediate rash, facial/tongue/throat swelling, SOB or lightheadedness with hypotension: n/a Has patient had a PCN reaction causing severe rash involving mucus membranes or skin necrosis: n/a Has patient had a PCN reaction that required hospitalization: n/a Has patient had a PCN reaction occurring within the last 10 years: n/a If all of the above answers are "NO", then may proceed with Cephalosporin use.     No current facility-administered medications on file prior to encounter.    Current Outpatient Medications on File Prior to Encounter  Medication Sig  . ciprofloxacin (CIPRO) 250 MG tablet   . clindamycin (CLEOCIN) 150 MG capsule Take 150 mg by mouth 4 (four) times daily.  . colesevelam (WELCHOL) 625 MG tablet Take 3 tablets (1,875 mg total) by mouth daily.  . diphenoxylate-atropine (LOMOTIL) 2.5-0.025 MG per tablet Take 1 tablet by mouth 4 (four) times daily as needed for diarrhea or loose stools.  . hydrocortisone (ANUSOL-HC) 25 MG suppository Place 1 suppository (25 mg total) rectally 2 (two) times daily.  . hyoscyamine (LEVSIN SL) 0.125 MG SL tablet Place 1 tablet (0.125 mg total) under the tongue every 4 (four) hours as needed for cramping.  Marland Kitchen ibuprofen (ADVIL,MOTRIN) 600 MG tablet Take 1 tablet (600 mg total) by mouth every 6 (six) hours as needed.  . metroNIDAZOLE (FLAGYL) 500 MG tablet Take one po BID x 10 days  . Multiple Vitamin (MULTIVITAMIN) tablet Take 2 tablets by mouth daily.   . NON FORMULARY Paul bragg apple cider vin with the mother  . predniSONE (STERAPRED UNI-PAK 21 TAB) 10 MG (21) TBPK tablet Take 6 tabs by mouth daily  for 2 days, then 5 tabs for 2 days, then 4 tabs for 2 days, then 3 tabs for 2 days, 2 tabs for 2 days, then 1 tab by mouth daily for 2 days    FAMILY HISTORY:  Her indicated that her mother is deceased. She indicated that her father is deceased. She indicated that the status of her neg hx is unknown.   SOCIAL HISTORY: She  reports that she quit  smoking about 44 years ago. Her smoking use included cigarettes. she has never used smokeless tobacco. She reports that she drinks alcohol. She reports that she uses drugs. Drug: Marijuana.  REVIEW OF SYSTEMS:   Unable  SUBJECTIVE:  Marked discomfort from face  VITAL SIGNS: BP (!) 190/90   Pulse 97   Temp 98.1 F (36.7 C) (Oral)   Resp 17   Ht 5\' 5"  (1.651 m)   Wt 160 lb (72.6 kg)   SpO2 96%   BMI 26.63 kg/m   HEMODYNAMICS:    VENTILATOR SETTINGS:    INTAKE / OUTPUT: No intake/output data recorded.  PHYSICAL EXAMINATION: General: 77 year old white female currently sitting in medical stretcher no acute distress. Neuro: Awake oriented x3 HEENT: Marked inflammation and swelling involving the lower mandible extending down the anterior neck skin is erythemic, with copious oral secretions unable to swallow Cardiovascular: Regular rate and rhythm Lungs: Clear on exam without accessory use Abdomen: Soft nontender Musculoskeletal: Equal strength and bulk Skin: Warm and dry with erythema as noted above over anterior neck  LABS:  BMET Recent Labs  Lab 07/04/17 1037  NA 139  K 4.4  CL 103  CO2 25  BUN 20  CREATININE 0.78  GLUCOSE 117*    Electrolytes Recent Labs  Lab 07/04/17 1037  CALCIUM 9.6    CBC Recent Labs  Lab 07/04/17 1037  WBC 18.9*  HGB 14.5  HCT 44.6  PLT 479*    Coag's No results for input(s): APTT, INR in the last 168 hours.  Sepsis Markers No results for input(s): LATICACIDVEN, PROCALCITON, O2SATVEN in the last 168 hours.  ABG No results for input(s): PHART, PCO2ART, PO2ART in the last 168 hours.  Liver Enzymes Recent Labs  Lab 07/04/17 1037  AST 18  ALT 19  ALKPHOS 75  BILITOT 0.7  ALBUMIN 3.7    Cardiac Enzymes No results for input(s): TROPONINI, PROBNP in the last 168 hours.  Glucose No results for input(s): GLUCAP in the last 168 hours.  Imaging Ct Soft Tissue Neck W Contrast  Result Date: 07/04/2017 CLINICAL  DATA:  Increasing pain and swelling in the floor of the mouth. EXAM: CT NECK WITH CONTRAST TECHNIQUE: Multidetector CT imaging of the neck was performed using the standard protocol following the bolus administration of intravenous contrast. CONTRAST:  64mL ISOVUE-300 IOPAMIDOL (ISOVUE-300) INJECTION 61% COMPARISON:  07/02/2017. FINDINGS: Pharynx and larynx: There is complex enhancement within the tongue, related to penetration of a dental implant through the posterior cortex of the mandible. Centrally, there is a 2 cm enhancing lesion involving the anterior most tongue. Behind the mandible, there is a subperiosteal collection on the RIGHT, 5 x 10 x 12 mm cross-section. There is ill-defined enhancement more posteriorly within the tongue, 6 x 11 mm collection as seen on sagittal series 5, image 54. There is extensive fluid dissecting posteriorly through the posterior tongue, in a submucosal location involving the RIGHT greater than LEFT valleculae and epiglottis. Fluid extends downward along the RIGHT aryepiglottic fold. There is fluid and edema in the sublingual  space, and submandibular space, greater on the RIGHT. Salivary glands: No inflammation, mass, or stone. Thyroid: Normal. Lymph nodes: Reactive level 1 and level 2 adenopathy, greater on the RIGHT. Vascular: Atherosclerosis.  No vascular thrombosis. Limited intracranial: Negative. Visualized orbits: Negative. Mastoids and visualized paranasal sinuses: Clear. Skeleton: No acute or aggressive process.  Spondylosis. Upper chest: Negative. Other: None. IMPRESSION: Complex inflammatory process related to malposition of a RIGHT mandibular dental implant which has penetrated the posterior cortex of the mandible. Subperiosteal abscess along the RIGHT mandible, enhancing fluid collections within the tongue, and fluid dissecting posteriorly in a submucosal fashion into the hypopharyngeal area, all consistent with Ludwig's angina. Patient is at risk for airway compromise  should further edema develop. See discussion above. These results were called by telephone at the time of interpretation on 07/04/2017 at 1:02 pm to Dr. Lucia Gaskins, who verbally acknowledged these results. He reports that the plan is for removal of dental implant with admission for close airway observation. Electronically Signed   By: Staci Righter M.D.   On: 07/04/2017 13:10     STUDIES:  CT neck 2/14: Complex inflammatory process related to malposition of a RIGHT mandibular dental implant which has penetrated the posterior cortex of the mandible. Subperiosteal abscess along the RIGHT mandible, enhancing fluid collections within the tongue, and fluid dissecting posteriorly in a submucosal fashion into the hypopharyngeal area, all consistent with Ludwig's angina. Patient is at risk for airway compromise should further edema develop. See discussion above. These results were called by telephone at the time of interpretation on 07/04/2017  CULTURES: BCX2 2/14>>>  ANTIBIOTICS: Meropenem 2/14>>>  SIGNIFICANT EVENTS:   LINES/TUBES:   DISCUSSION:   ASSESSMENT / PLAN:  Severe sepsis in setting of Ludwig's angina w/ extensive subperiosteal abscess along the right mandible s/p dental implant malposition.  Plan OMS called. She will be going to OR for drainage BCX2 sent, follow as needed Empiric meropenem  ENT following IV hydration Cont tele   Dysphagia 2/2 above Plan NPO post-op  At risk for airway compromise Plan ENT following  Will recover in ICU   FAMILY  - Updates:   - Inter-disciplinary family meet or Palliative Care meeting due by: 2/21   Erick Colace ACNP-BC [place] Pager # [phone] OR # [phone] if no answer  07/04/2017, 1:39 PM  Attending Note:  77 year old female with recent dental implant placement 1 week prior to admission who presents to the hospital with jaw pain, swelling, fever and unable to swallow.  CT was done that I reviewed  myself that demonstrated that the implant penetrating the jaw and protruding into the oral cavity.  On exam, unable to swallow and diffuse edema dissecting into the neck anteriorly.  OMFS was called and MD will be coming in to take patient to the OR for removal of implant and debridement.  Will require ICU admission after.  PCCM called to admit to the ICU for further management.  Will admit to the ICU.  Start clindamycin ID.  F/U on cultures.  Source control per OMFS.  Full vent support.  Spoke with patient regarding her wishes, she does not wish for have CPR or cardioversion and short term intubation only.  Will adjust code status accordingly.  The patient is critically ill with multiple organ systems failure and requires high complexity decision making for assessment and support, frequent evaluation and titration of therapies, application of advanced monitoring technologies and extensive interpretation of multiple databases.   Critical Care Time  devoted to patient care services described in this note is  60  Minutes. This time reflects time of care of this signee Dr Jennet Maduro. This critical care time does not reflect procedure time, or teaching time or supervisory time of PA/NP/Med student/Med Resident etc but could involve care discussion time.  Rush Farmer, M.D. [facility] Pulmonary/Critical Care Medicine. Pager: [phone]. After hours pager: [phone].

## 2017-07-04 NOTE — ED Notes (Signed)
Phlebotomy at bedside.

## 2017-07-04 NOTE — Anesthesia Preprocedure Evaluation (Addendum)
Anesthesia Evaluation  Patient identified by MRN, date of birth, ID band Patient awake    Reviewed: Allergy & Precautions, H&P , NPO status , [name] Chart, lab work & pertinent test results  History of Anesthesia Complications Negative for: history of anesthetic complications  Airway Mallampati: III  TM Distance: >3 FB Neck ROM: full  Mouth opening: Limited Mouth Opening  Dental  (+) Teeth Intact, Implants, Missing CT 2/24  IMPRESSION: Complex inflammatory process related to malposition of a RIGHT mandibular dental implant which has penetrated the posterior cortex of the mandible. Subperiosteal abscess along the RIGHT mandible, enhancing fluid collections within the tongue, and fluid dissecting posteriorly in a submucosal fashion into the hypopharyngeal area, all consistent with Ludwig's angina. Patient is at risk for airway compromise should further edema develop. :   Pulmonary former smoker,    Pulmonary exam normal        Cardiovascular Normal cardiovascular exam Rhythm:regular Rate:Normal     Neuro/Psych    GI/Hepatic Hepatic hemangioma, colon polyps , diverticulosis cholelithiasis   Endo/Other    Renal/GU      Musculoskeletal   Abdominal   Peds  Hematology  (+) anemia ,   Anesthesia Other Findings   Reproductive/Obstetrics                            Anesthesia Physical  Anesthesia Plan  ASA: III and emergent  Anesthesia Plan: General   Post-op Pain Management:    Induction: Intravenous  PONV Risk Score and Plan: 2 and Ondansetron and Treatment may vary due to age or medical condition  Airway Management Planned: Oral ETT, Fiberoptic Intubation Planned, Awake Intubation Planned and Video Laryngoscope Planned  Additional Equipment:   Intra-op Plan:   Post-operative Plan: Possible Post-op intubation/ventilation  Informed Consent: I have reviewed the patients History and  Physical, chart, labs and discussed the procedure including the risks, benefits and alternatives for the proposed anesthesia with the patient or authorized representative who has indicated his/her understanding and acceptance.   Dental advisory given  Plan Discussed with: CRNA, Anesthesiologist and Surgeon  Anesthesia Plan Comments:        Anesthesia Quick Evaluation

## 2017-07-04 NOTE — ED Notes (Signed)
Patient ambulatory to bathroom with steady gait at this time 

## 2017-07-04 NOTE — Consult Note (Signed)
Reason for Consult:Ludwig's angina Referring Physician: ER MD  Ameli Sangiovanni is an 77 y.o. female.  HPI: Patient is 2 weeks s/p lower dental implant with  Dr Satira Sark. Over the last 4 days she has developed increasing pain and swelling in the floor of mouth. She was seen in [facility] ER 2 days ago and had a CT scan and was started on po antibiotics. She's had increasing swelling and difficulty swallowing the antibiotics and presented to [facility] ER. On review of the previous CT scan the implant protrudes through the back cortex of the mandible into the deep FOM with early abscess at that time.  Past Medical History:  Diagnosis Date  . Colon polyp 07/18/2004   Hyperplastic  . Diverticulosis   . Hepatic hemangioma 07/16/2012  . Liver hemangioma   . Pernicious anemia   . Rectal bleed     Past Surgical History:  Procedure Laterality Date  . APPENDECTOMY  2012   Dr Zella Richer  . APPENDECTOMY    . CHOLECYSTECTOMY  05/30/2012   Procedure: LAPAROSCOPIC CHOLECYSTECTOMY WITH INTRAOPERATIVE CHOLANGIOGRAM;  Surgeon: Gayland Curry, MD,FACS;  Location: [place];  Service: General;  Laterality: N/A;  . DENTAL SURGERY    . HERNIA REPAIR    . INGUINAL HERNIA REPAIR  12/24/06   left; laparoscopic  . KNEE SURGERY     right  . UMBILICAL HERNIA REPAIR  12/24/06   with reduction of sigmoid colon which was incarcerated    Social History:  reports that she quit smoking about 44 years ago. Her smoking use included cigarettes. she has never used smokeless tobacco. She reports that she drinks alcohol. She reports that she uses drugs. Drug: Marijuana.  Allergies:  Allergies  Allergen Reactions  . Codeine     REACTION: questionable  . Codeine Other (See Comments)    Unknown reaction   . Penicillins     REACTION: questionable  . Penicillins Other (See Comments)    Unknown reaction  Has patient had a PCN reaction causing immediate rash, facial/tongue/throat swelling, SOB or lightheadedness with hypotension: n/a Has  patient had a PCN reaction causing severe rash involving mucus membranes or skin necrosis: n/a Has patient had a PCN reaction that required hospitalization: n/a Has patient had a PCN reaction occurring within the last 10 years: n/a If all of the above answers are "NO", then may proceed with Cephalosporin use.     Medications: I have reviewed the patient's current medications.  Results for orders placed or performed during the hospital encounter of 07/04/17 (from the past 48 hour(s))  CBC with Differential     Status: Abnormal   Collection Time: 07/04/17 10:37 AM  Result Value Ref Range   WBC 18.9 (H) 4.0 - 10.5 K/uL   RBC 4.66 3.87 - 5.11 MIL/uL   Hemoglobin 14.5 12.0 - 15.0 g/dL   HCT 44.6 36.0 - 46.0 %   MCV 95.7 78.0 - 100.0 fL   MCH 31.1 26.0 - 34.0 pg   MCHC 32.5 30.0 - 36.0 g/dL   RDW 13.2 11.5 - 15.5 %   Platelets 479 (H) 150 - 400 K/uL   Neutrophils Relative % 78 %   Neutro Abs 14.8 (H) 1.7 - 7.7 K/uL   Lymphocytes Relative 14 %   Lymphs Abs 2.6 0.7 - 4.0 K/uL   Monocytes Relative 8 %   Monocytes Absolute 1.5 (H) 0.1 - 1.0 K/uL   Eosinophils Relative 0 %   Eosinophils Absolute 0.0 0.0 - 0.7 K/uL  Basophils Relative 0 %   Basophils Absolute 0.0 0.0 - 0.1 K/uL    Comment: Performed at [facility], [place] [address] [place], [place] [address]  Comprehensive metabolic panel     Status: Abnormal   Collection Time: 07/04/17 10:37 AM  Result Value Ref Range   Sodium 139 135 - 145 mmol/L   Potassium 4.4 3.5 - 5.1 mmol/L   Chloride 103 101 - 111 mmol/L   CO2 25 22 - 32 mmol/L   Glucose, Bld 117 (H) 65 - 99 mg/dL   BUN 20 6 - 20 mg/dL   Creatinine, Ser 0.78 0.44 - 1.00 mg/dL   Calcium 9.6 8.9 - 10.3 mg/dL   Total Protein 7.4 6.5 - 8.1 g/dL   Albumin 3.7 3.5 - 5.0 g/dL   AST 18 15 - 41 U/L   ALT 19 14 - 54 U/L   Alkaline Phosphatase 75 38 - 126 U/L   Total Bilirubin 0.7 0.3 - 1.2 mg/dL   GFR calc non Af Amer >60 >60 mL/min   GFR calc Af Amer >60 >60 mL/min     Comment: (NOTE) The eGFR has been calculated using the CKD EPI equation. This calculation has not been validated in all clinical situations. eGFR's persistently <60 mL/min signify possible Chronic Kidney Disease.    Anion gap 11 5 - 15    Comment: Performed at [facility] [address], [place], [place] [address]    No results found.  ROS:per HPI with worsening pain   PE: Awake alert with out airway compromise. Slightly muffled speech secondary to FOM swelling and BOT fullness Neck with mild swelling Nasal passages clear Lungs clear  Assessment/Plan: FOM phlegmon abscess secondary to recent implant placement   IV clindamycin and steroids Obtain CT scan to evaluate extent of infection Remove implant under local Admit to ICU with close airway observation  Melony Overly 07/04/2017, 11:54 AM

## 2017-07-04 NOTE — ED Triage Notes (Signed)
Patient seen at [facility] ED yesterday and diagnosed with strep. prescribed meds but states unable to take due to pain

## 2017-07-04 NOTE — Anesthesia Postprocedure Evaluation (Signed)
Anesthesia Post Note  Patient: Tina Hardy  Procedure(s) Performed: REMOVAL OF IMPLANT MANDIBLE (N/A Mouth)     Patient location during evaluation: ICU Anesthesia Type: General Level of consciousness: patient remains intubated per anesthesia plan Pain management: pain level controlled Vital Signs Assessment: post-procedure vital signs reviewed and stable Respiratory status: patient remains intubated per anesthesia plan Cardiovascular status: stable Anesthetic complications: no    Last Vitals:  Vitals:   07/04/17 1145 07/04/17 1949  BP: (!) 204/104   Pulse: (!) 107   Resp: (!) 22   Temp:    SpO2: 98% 99%    Last Pain:  Vitals:   07/04/17 0750  TempSrc:   PainSc: 10-Worst pain ever                 Alexes Menchaca

## 2017-07-04 NOTE — Transfer of Care (Signed)
Immediate Anesthesia Transfer of Care Note  Patient: Tina Hardy  Procedure(s) Performed: REMOVAL OF IMPLANT MANDIBLE (N/A Mouth)  Patient Location: ICU  Anesthesia Type:General  Level of Consciousness: Patient remains intubated per anesthesia plan  Airway & Oxygen Therapy: Patient placed on Ventilator (see vital sign flow sheet for setting)  Post-op Assessment: Report given to RN and Post -op Vital signs reviewed and stable  Post vital signs: Reviewed and stable  Last Vitals:  Vitals:   07/04/17 1130 07/04/17 1145  BP: (!) 190/90 (!) 204/104  Pulse: 97 (!) 107  Resp: 17 (!) 22  Temp:    SpO2: 96% 98%    Last Pain:  Vitals:   07/04/17 0750  TempSrc:   PainSc: 10-Worst pain ever         Complications: No apparent anesthesia complications

## 2017-07-04 NOTE — ED Notes (Signed)
Pt requesting additional pain medications

## 2017-07-04 NOTE — Progress Notes (Signed)
eLink Physician-Brief Progress Note Patient Name: Tina Hardy DOB: [birth year] MRN: [id number]   Date of Service  07/04/2017  HPI/Events of Note  Noted difficult airway  eICU Interventions  precedex + fent gtt  Restraints prn     Intervention Category Intermediate Interventions: Other:  Leanna Sato Shelvie Salsberry 07/04/2017, 8:34 PM

## 2017-07-05 ENCOUNTER — Encounter (HOSPITAL_COMMUNITY): Payer: Self-pay | Admitting: Oral Surgery

## 2017-07-05 ENCOUNTER — Inpatient Hospital Stay (HOSPITAL_COMMUNITY): Payer: Medicare Other

## 2017-07-05 DIAGNOSIS — J9600 Acute respiratory failure, unspecified whether with hypoxia or hypercapnia: Secondary | ICD-10-CM

## 2017-07-05 LAB — CBC
HEMATOCRIT: 37.6 % (ref 36.0–46.0)
HEMOGLOBIN: 12.3 g/dL (ref 12.0–15.0)
MCH: 30.9 pg (ref 26.0–34.0)
MCHC: 32.7 g/dL (ref 30.0–36.0)
MCV: 94.5 fL (ref 78.0–100.0)
Platelets: 404 10*3/uL — ABNORMAL HIGH (ref 150–400)
RBC: 3.98 MIL/uL (ref 3.87–5.11)
RDW: 13.2 % (ref 11.5–15.5)
WBC: 21.8 10*3/uL — ABNORMAL HIGH (ref 4.0–10.5)

## 2017-07-05 LAB — GLUCOSE, CAPILLARY
GLUCOSE-CAPILLARY: 117 mg/dL — AB (ref 65–99)
GLUCOSE-CAPILLARY: 172 mg/dL — AB (ref 65–99)
GLUCOSE-CAPILLARY: 92 mg/dL (ref 65–99)
Glucose-Capillary: 166 mg/dL — ABNORMAL HIGH (ref 65–99)
Glucose-Capillary: 152 mg/dL — ABNORMAL HIGH (ref 65–99)
Glucose-Capillary: 106 mg/dL — ABNORMAL HIGH (ref 65–99)
Glucose-Capillary: 91 mg/dL (ref 65–99)

## 2017-07-05 LAB — PHOSPHORUS: PHOSPHORUS: 3.6 mg/dL (ref 2.5–4.6)

## 2017-07-05 LAB — MRSA PCR SCREENING: MRSA by PCR: NEGATIVE

## 2017-07-05 LAB — BASIC METABOLIC PANEL
ANION GAP: 10 (ref 5–15)
BUN: 23 mg/dL — ABNORMAL HIGH (ref 6–20)
CHLORIDE: 105 mmol/L (ref 101–111)
CO2: 22 mmol/L (ref 22–32)
CREATININE: 0.84 mg/dL (ref 0.44–1.00)
Calcium: 8.6 mg/dL — ABNORMAL LOW (ref 8.9–10.3)
GFR calc non Af Amer: >60 mL/min (ref >60)
Glucose, Bld: 171 mg/dL — ABNORMAL HIGH (ref 65–99)
Potassium: 4.1 mmol/L (ref 3.5–5.1)
Sodium: 137 mmol/L (ref 135–145)

## 2017-07-05 LAB — MAGNESIUM: Magnesium: 1.9 mg/dL (ref 1.7–2.4)

## 2017-07-05 MED ORDER — DEXMEDETOMIDINE HCL IN NACL 400 MCG/100ML IV SOLN
0.4000 ug/kg/h | INTRAVENOUS | Status: DC
  Administered 2017-07-05: 0.4 ug/kg/h via INTRAVENOUS
  Filled 2017-07-05: qty 100

## 2017-07-05 MED ORDER — FENTANYL CITRATE (PF) 100 MCG/2ML IJ SOLN
25.0000 ug | INTRAMUSCULAR | Status: DC | PRN
  Administered 2017-07-05: 25 ug via INTRAVENOUS
  Administered 2017-07-05: 50 ug via INTRAVENOUS
  Administered 2017-07-06: 100 ug via INTRAVENOUS
  Administered 2017-07-06: 50 ug via INTRAVENOUS
  Filled 2017-07-05 (×4): qty 2

## 2017-07-05 NOTE — Anesthesia Postprocedure Evaluation (Signed)
Anesthesia Post Note  Patient: Optician, dispensing  Procedure(s) Performed: REMOVAL OF IMPLANT MANDIBLE (N/A Mouth)     Patient location during evaluation: ICU Anesthesia Type: General Level of consciousness: patient remains intubated per anesthesia plan Pain management: pain level controlled Vital Signs Assessment: post-procedure vital signs reviewed and stable Respiratory status: patient remains intubated per anesthesia plan Cardiovascular status: stable Anesthetic complications: no    Last Vitals:  Vitals:   07/05/17 0400 07/05/17 0500  BP: (!) 100/58 (!) 99/54  Pulse: 68 67  Resp: 18 18  Temp:    SpO2: 99% 99%    Last Pain:  Vitals:   07/05/17 0358  TempSrc: Oral  PainSc:                  Shenay Torti

## 2017-07-05 NOTE — Progress Notes (Signed)
Initial Nutrition Assessment  DOCUMENTATION CODES:   Not applicable  INTERVENTION:    If unable to extubate patient within the next 24 hours, consider placing small bore NGT for TF: Vital AF 1.2 at 50 ml/h would provide 1440 kcal, 90 gm protein, 973 ml free water daily.  NUTRITION DIAGNOSIS:   Inadequate oral intake related to inability to eat as evidenced by NPO status.  GOAL:   Patient will meet greater than or equal to 90% of their needs  MONITOR:   Vent status, Labs, I & O's  REASON FOR ASSESSMENT:   Ventilator    ASSESSMENT:   77 yo female with PMH of diverticulosis, colon polyp, hepatic hemangioma, pernicious anemia, and rectal bleed, who was admitted on 2/14 with increased mouth pain and difficulty swallowing r/t Ludwig's angina, s/p dental implant 2 weeks PTA. S/P I&D with removal of dental implant on admission; remained intubated due to submental edema.  No enteral access at this time.  Patient is currently intubated on ventilator support. MV: 7.8 L/min Temp (24hrs), Avg:98.4 F (36.9 C), Min:97.5 F (36.4 C), Max:98.9 F (37.2 C)   Labs reviewed. CBG's: [id number] Medications reviewed.   NUTRITION - FOCUSED PHYSICAL EXAM:    Most Recent Value  Orbital Region  No depletion  Upper Arm Region  No depletion  Thoracic and Lumbar Region  Unable to assess  Buccal Region  Unable to assess  Temple Region  No depletion  Clavicle Bone Region  No depletion  Clavicle and Acromion Bone Region  No depletion  Scapular Bone Region  Unable to assess  Dorsal Hand  No depletion  Patellar Region  No depletion  Anterior Thigh Region  No depletion  Posterior Calf Region  No depletion  Edema (RD Assessment)  None  Hair  Reviewed  Eyes  Unable to assess  Mouth  Unable to assess  Skin  Reviewed  Nails  Reviewed       Diet Order:  Diet NPO time specified  EDUCATION NEEDS:   No education needs have been identified at this time  Skin:  Skin Assessment: Skin  Integrity Issues: Skin Integrity Issues:: Incisions Incisions: neck  Last BM:  unknown  Height:   Ht Readings from Last 1 Encounters:  07/04/17 5\' 5"  (1.651 m)    Weight:   Wt Readings from Last 1 Encounters:  07/05/17 158 lb 11.7 oz (72 kg)    Ideal Body Weight:  56.8 kg  BMI:  Body mass index is 26.41 kg/m.  Estimated Nutritional Needs:   Kcal:  3428  Protein:  90-110 gm  Fluid:  1.5-1.8 L   [name], RD, LDN, [place] Pager [phone] After Hours Pager [phone]

## 2017-07-05 NOTE — Progress Notes (Signed)
PULMONARY / CRITICAL CARE MEDICINE   Name: Tina Hardy MRN: [id number] DOB: 06/17/1940    ADMISSION DATE:  07/04/2017 CONSULTATION DATE: 2/14  REFERRING MD:  Ellender Hose  CHIEF COMPLAINT:  sepsis  HISTORY OF PRESENT ILLNESS:   This is a 77 year old female who recently underwent dental implant approximately 2 weeks prior to presentation.  Over the last 4 days prior to admission was having increased mouth pain and swelling at the floor of the mouth.  She was seen in the emergency room on 2/12, had a CT scan which was negative for retropharyngeal fluid.  She was started on oral antibiotics.  Over the following 2 days she developed worsening pain, difficulty swallowing and more swelling.  She returned once again to the emergency room, a repeat CT scan was obtained showing the implant to protrude through the back cortex of the mandible with early abscess formation.  Further imaging of soft tissue of the neck was obtained showing fluid collection within the tongue, as well as fluid dissecting posteriorly into the hypopharyngeal area consistent with Ludwig's angina.  She had already been seen by ENT, started on IV antibiotics and, Oral maxillary surgery was consulted critical care asked to admit.    SUBJECTIVE:  Underwent oral maxillofacial surgery on 2/14, Penrose drain in place. Awake, alert, interacting and tolerating pressure support ventilation currently.  VITAL SIGNS: BP 137/65   Pulse 92   Temp 98.9 F (37.2 C) (Oral)   Resp 18   Ht 5\' 5"  (1.651 m)   Wt 72 kg (158 lb 11.7 oz)   SpO2 99%   BMI 26.41 kg/m   HEMODYNAMICS:    VENTILATOR SETTINGS: Vent Mode: PSV;CPAP FiO2 (%):  [40 %-100 %] 40 % Set Rate:  [16 bmp-18 bmp] 18 bmp Vt Set:  [450 mL-500 mL] 450 mL PEEP:  [5 cmH20] 5 cmH20 Pressure Support:  [5 cmH20] 5 cmH20 Plateau Pressure:  [17 cmH20] 17 cmH20  INTAKE / OUTPUT: I/O last 3 completed shifts: In: 1093.9 [I.V.:793.9; IV Piggyback:300] Out: 300 [Urine:250;  Blood:50]  PHYSICAL EXAMINATION: General: Elderly woman in no distress on mechanical ventilation Neuro: Wakes easily, answers questions, follows commands, moves all extremities HEENT: There is still some left neck edema present with a Penrose drain in place. Cardiovascular: Regular, no murmur Lungs: Clear bilaterally, no wheezes, no crackles Abdomen: Soft, benign, positive bowel sounds Musculoskeletal: No deformity Skin: Some erythema on the neck but no other abnormalities  LABS:  BMET Recent Labs  Lab 07/04/17 1037 07/04/17 1405 07/05/17 0402  NA 139 138 137  K 4.4 4.0 4.1  CL 103 103 105  CO2 25 23 22   BUN 20 18 23*  CREATININE 0.78 0.73 0.84  GLUCOSE 117* 145* 171*    Electrolytes Recent Labs  Lab 07/04/17 1037 07/04/17 1405 07/05/17 0402  CALCIUM 9.6 9.2 8.6*  MG  --  1.9 1.9  PHOS  --  2.3* 3.6    CBC Recent Labs  Lab 07/04/17 1037 07/04/17 1405 07/05/17 0402  WBC 18.9* 23.3* 21.8*  HGB 14.5 15.0 12.3  HCT 44.6 45.2 37.6  PLT 479* 508* 404*    Coag's No results for input(s): APTT, INR in the last 168 hours.  Sepsis Markers Recent Labs  Lab 07/04/17 1405  PROCALCITON <0.10    ABG Recent Labs  Lab 07/04/17 2000  PHART 7.432  PCO2ART 37.5  PO2ART 191*    Liver Enzymes Recent Labs  Lab 07/04/17 1037 07/04/17 1405  AST 18 21  ALT 19 21  ALKPHOS 75 88  BILITOT 0.7 0.8  ALBUMIN 3.7 3.8    Cardiac Enzymes No results for input(s): TROPONINI, PROBNP in the last 168 hours.  Glucose Recent Labs  Lab 07/04/17 2029 07/05/17 0022 07/05/17 0356 07/05/17 0725 07/05/17 1234  GLUCAP 208* 172* 166* 152* 117*    Imaging Dg Chest Port 1 View  Result Date: 07/05/2017 CLINICAL DATA:  Ventilator dependent EXAM: PORTABLE CHEST 1 VIEW COMPARISON:  Yesterday FINDINGS: Endotracheal tube tip at the clavicular heads. There are streaky opacities at the left more than right bases. No Kerley lines, effusion, or pneumothorax. Normal heart size.  IMPRESSION: 1. Low volume chest with atelectatic type opacities at the left more than right bases. 2. Endotracheal tube in good position. Electronically Signed   By: Monte Fantasia M.D.   On: 07/05/2017 07:06   Dg Chest Port 1 View  Result Date: 07/04/2017 CLINICAL DATA:  77 year old female postoperative day zero status post incision and drainage of sublingual abscess/cellulitis. Intubated. EXAM: PORTABLE CHEST 1 VIEW COMPARISON:  Chest radiograph 11/14/2010. FINDINGS: Portable AP semi upright view at 1934 hrs. Endotracheal tube tip at the level the clavicles. Mediastinal contours remain normal. Mildly low lung volumes. Patchy bibasilar opacity most resembling atelectasis, greater on the left. No pneumothorax, pulmonary edema or definite pleural effusion. Negative visible bowel gas pattern. Right upper quadrant cholecystectomy clips are new since 2012. IMPRESSION: 1. Endotracheal tube tip in good position. 2. Low lung volumes with left greater than right lung base atelectasis. Electronically Signed   By: Genevie Ann M.D.   On: 07/04/2017 19:58     STUDIES:  CT neck 2/14: Complex inflammatory process related to malposition of a RIGHT mandibular dental implant which has penetrated the posterior cortex of the mandible. Subperiosteal abscess along the RIGHT mandible, enhancing fluid collections within the tongue, and fluid dissecting posteriorly in a submucosal fashion into the hypopharyngeal area, all consistent with Ludwig's angina. Patient is at risk for airway compromise should further edema develop. See discussion above. These results were called by telephone at the time of interpretation on 07/04/2017  CULTURES: BCX2 2/14>>> Surgical wound 2/14 >> moderate GPC, few GNR >>   ANTIBIOTICS: Meropenem 2/14>>>  SIGNIFICANT EVENTS: Neck debridement and drainage 2/14  LINES/TUBES: ET tube 2/14 >>   DISCUSSION:   ASSESSMENT / PLAN: Acute respiratory failure due to airway  compromise Plan Spontaneous breathing trial 2/15. Given her neck edema would not extubate unless she has a clear cuff leak  Severe sepsis in setting of Ludwig's angina w/ extensive subperiosteal abscess along the right mandible s/p dental implant malposition.  Improved postsurgical drainage Plan Appreciate oral maxillofacial surgery assistance Continue to follow culture data Continue empiric meropenem, consider addition of vancomycin if she declines in any way.  Does not appear to be necessary at this time Any further neck imaging per surgery plans   Dysphagia 2/2 above Plan Continue n.p.o. for now   FAMILY  - Updates: None present 2/15  - Inter-disciplinary family meet or Palliative Care meeting due by: 2/21   Independent CC time 22 minutes  Baltazar Apo, MD, PhD 07/05/2017, 3:09 PM [facility] [phone] or if no answer [phone]

## 2017-07-05 NOTE — Progress Notes (Signed)
Wasted 230 mL Fentanyl in sink with Enos Fling, RN.

## 2017-07-05 NOTE — Progress Notes (Signed)
eLink Physician-Brief Progress Note Patient Name: Nyriah Coote DOB: [birth year] MRN: [id number]   Date of Service  07/05/2017  HPI/Events of Note  Patient complaining 10/10 pain  eICU Interventions  Fentanyl prn ordered.     Intervention Category Intermediate Interventions: Pain - evaluation and management  Sharia Reeve 07/05/2017, 8:04 PM

## 2017-07-05 NOTE — Procedures (Signed)
Extubation Procedure Note  Patient Details:   Name: Tina Hardy DOB: [birth year] MRN: [id number]   Airway Documentation:  Airway 7 mm (Active)  Secured at (cm) 22 cm 07/05/2017 12:02 PM  Measured From Lips 07/05/2017 12:02 PM  Secured Location Left 07/05/2017 12:02 PM  Secured By [employer] 07/05/2017 12:02 PM  Tube Holder Repositioned Yes 07/05/2017 12:02 PM  Cuff Pressure (cm H2O) 28 cm H2O 07/05/2017  8:49 AM  Site Condition Dry 07/05/2017 12:02 PM    Evaluation  O2 sats: stable throughout Complications: No apparent complications Patient did tolerate procedure well. Bilateral Breath Sounds: Clear   Yes. Patient had positive cuff leak prior to extubation. Patient has strong cough. Placed on 3lpm humidified O2.   Ander Purpura 07/05/2017, 3:52 PM

## 2017-07-05 NOTE — Progress Notes (Signed)
1 Day Post-Op   Subjective/Chief Complaint: Patient intubated in ICU.  Objective: Vital signs in last 24 hours: Temp:  [97.5 F (36.4 C)-98.9 F (37.2 C)] 98.9 F (37.2 C) (02/15 0728) Pulse Rate:  [63-110] 66 (02/15 0700) Resp:  [14-22] 18 (02/15 0700) BP: (96-204)/(51-104) 109/55 (02/15 0700) SpO2:  [95 %-99 %] 99 % (02/15 0700) FiO2 (%):  [100 %] 100 % (02/15 0400) Weight:  [70.2 kg (154 lb 12.2 oz)-72.6 kg (160 lb)] 72 kg (158 lb 11.7 oz) (02/15 0500)    Intake/Output from previous day: 02/14 0701 - 02/15 0700 In: 1021.9 [I.V.:721.9; IV Piggyback:300] Out: 300 [Urine:250; Blood:50] Intake/Output this shift: No intake/output data recorded.  Physical Exam: Gen: intubated, arousable, alert, in NAD HEENT: mod submental edema, penrose drain in submental area, no active purulence. Intraorally, her wound is hemostatic. Mod-severe floor of mouth edema - firm with superior tongue displacement.  Lab Results:  Recent Labs    07/04/17 1405 07/05/17 0402  WBC 23.3* 21.8*  HGB 15.0 12.3  HCT 45.2 37.6  PLT 508* 404*   BMET Recent Labs    07/04/17 1405 07/05/17 0402  NA 138 137  K 4.0 4.1  CL 103 105  CO2 23 22  GLUCOSE 145* 171*  BUN 18 23*  CREATININE 0.73 0.84  CALCIUM 9.2 8.6*   PT/INR No results for input(s): LABPROT, INR in the last 72 hours. ABG Recent Labs    07/04/17 2000  PHART 7.432  HCO3 24.6    Studies/Results: Ct Soft Tissue Neck W Contrast  Result Date: 07/04/2017 CLINICAL DATA:  Increasing pain and swelling in the floor of the mouth. EXAM: CT NECK WITH CONTRAST TECHNIQUE: Multidetector CT imaging of the neck was performed using the standard protocol following the bolus administration of intravenous contrast. CONTRAST:  65mL ISOVUE-300 IOPAMIDOL (ISOVUE-300) INJECTION 61% COMPARISON:  07/02/2017. FINDINGS: Pharynx and larynx: There is complex enhancement within the tongue, related to penetration of a dental implant through the posterior cortex of  the mandible. Centrally, there is a 2 cm enhancing lesion involving the anterior most tongue. Behind the mandible, there is a subperiosteal collection on the RIGHT, 5 x 10 x 12 mm cross-section. There is ill-defined enhancement more posteriorly within the tongue, 6 x 11 mm collection as seen on sagittal series 5, image 54. There is extensive fluid dissecting posteriorly through the posterior tongue, in a submucosal location involving the RIGHT greater than LEFT valleculae and epiglottis. Fluid extends downward along the RIGHT aryepiglottic fold. There is fluid and edema in the sublingual space, and submandibular space, greater on the RIGHT. Salivary glands: No inflammation, mass, or stone. Thyroid: Normal. Lymph nodes: Reactive level 1 and level 2 adenopathy, greater on the RIGHT. Vascular: Atherosclerosis.  No vascular thrombosis. Limited intracranial: Negative. Visualized orbits: Negative. Mastoids and visualized paranasal sinuses: Clear. Skeleton: No acute or aggressive process.  Spondylosis. Upper chest: Negative. Other: None. IMPRESSION: Complex inflammatory process related to malposition of a RIGHT mandibular dental implant which has penetrated the posterior cortex of the mandible. Subperiosteal abscess along the RIGHT mandible, enhancing fluid collections within the tongue, and fluid dissecting posteriorly in a submucosal fashion into the hypopharyngeal area, all consistent with Ludwig's angina. Patient is at risk for airway compromise should further edema develop. See discussion above. These results were called by telephone at the time of interpretation on 07/04/2017 at 1:02 pm to Dr. Lucia Gaskins, who verbally acknowledged these results. He reports that the plan is for removal of dental implant with admission for  close airway observation. Electronically Signed   By: Staci Righter M.D.   On: 07/04/2017 13:10   Dg Chest Port 1 View  Result Date: 07/05/2017 CLINICAL DATA:  Ventilator dependent EXAM: PORTABLE  CHEST 1 VIEW COMPARISON:  Yesterday FINDINGS: Endotracheal tube tip at the clavicular heads. There are streaky opacities at the left more than right bases. No Kerley lines, effusion, or pneumothorax. Normal heart size. IMPRESSION: 1. Low volume chest with atelectatic type opacities at the left more than right bases. 2. Endotracheal tube in good position. Electronically Signed   By: Monte Fantasia M.D.   On: 07/05/2017 07:06   Dg Chest Port 1 View  Result Date: 07/04/2017 CLINICAL DATA:  77 year old female postoperative day zero status post incision and drainage of sublingual abscess/cellulitis. Intubated. EXAM: PORTABLE CHEST 1 VIEW COMPARISON:  Chest radiograph 11/14/2010. FINDINGS: Portable AP semi upright view at 1934 hrs. Endotracheal tube tip at the level the clavicles. Mediastinal contours remain normal. Mildly low lung volumes. Patchy bibasilar opacity most resembling atelectasis, greater on the left. No pneumothorax, pulmonary edema or definite pleural effusion. Negative visible bowel gas pattern. Right upper quadrant cholecystectomy clips are new since 2012. IMPRESSION: 1. Endotracheal tube tip in good position. 2. Low lung volumes with left greater than right lung base atelectasis. Electronically Signed   By: Genevie Ann M.D.   On: 07/04/2017 19:58    Anti-infectives: Anti-infectives (From admission, onward)   Start     Dose/Rate Route Frequency Ordered Stop   07/04/17 1430  meropenem (MERREM) 1 g in sodium chloride 0.9 % 100 mL IVPB     1 g 200 mL/hr over 30 Minutes Intravenous Every 8 hours 07/04/17 1420     07/04/17 1030  clindamycin (CLEOCIN) IVPB 900 mg     900 mg 100 mL/hr over 30 Minutes Intravenous  Once 07/04/17 1024 07/04/17 1126      Assessment/Plan: s/p Procedure(s): REMOVAL OF IMPLANT MANDIBLE (N/A) INCISION AND DRAINAGE  *cont antibiotics *will leave penrose drain in place for another 24-48 hours depending on drainage, change submental dressing twice daily. *submental  edema appears improved since surgery, however, difficult to assess airway edema. recommend continued intubation unless leak test is favorable for extubation.  *contact Dr. Mancel Parsons at [phone] with questions/concerns   LOS: 1 day    Michael Litter, DMD Oral & Maxillofacial Surgery 07/05/2017

## 2017-07-06 LAB — GLUCOSE, CAPILLARY
GLUCOSE-CAPILLARY: 90 mg/dL (ref 65–99)
GLUCOSE-CAPILLARY: 85 mg/dL (ref 65–99)
GLUCOSE-CAPILLARY: 95 mg/dL (ref 65–99)
GLUCOSE-CAPILLARY: 95 mg/dL (ref 65–99)
Glucose-Capillary: 94 mg/dL (ref 65–99)
Glucose-Capillary: 96 mg/dL (ref 65–99)

## 2017-07-06 LAB — BASIC METABOLIC PANEL
ANION GAP: 11 (ref 5–15)
BUN: 20 mg/dL (ref 6–20)
CHLORIDE: 104 mmol/L (ref 101–111)
CO2: 24 mmol/L (ref 22–32)
CREATININE: 0.66 mg/dL (ref 0.44–1.00)
Calcium: 8.9 mg/dL (ref 8.9–10.3)
GFR calc Af Amer: >60 mL/min (ref >60)
GFR calc non Af Amer: >60 mL/min (ref >60)
GLUCOSE: 95 mg/dL (ref 65–99)
Potassium: 4 mmol/L (ref 3.5–5.1)
Sodium: 139 mmol/L (ref 135–145)

## 2017-07-06 LAB — CBC
HEMATOCRIT: 40.2 % (ref 36.0–46.0)
Hemoglobin: 12.8 g/dL (ref 12.0–15.0)
MCH: 30.5 pg (ref 26.0–34.0)
MCHC: 31.8 g/dL (ref 30.0–36.0)
MCV: 95.9 fL (ref 78.0–100.0)
Platelets: 448 10*3/uL — ABNORMAL HIGH (ref 150–400)
RBC: 4.19 MIL/uL (ref 3.87–5.11)
RDW: 13.4 % (ref 11.5–15.5)
WBC: 21.7 10*3/uL — AB (ref 4.0–10.5)

## 2017-07-06 LAB — MAGNESIUM: Magnesium: 1.9 mg/dL (ref 1.7–2.4)

## 2017-07-06 MED ORDER — CHLORHEXIDINE GLUCONATE 0.12 % MT SOLN
15.0000 mL | Freq: Three times a day (TID) | OROMUCOSAL | Status: DC
  Administered 2017-07-06 – 2017-07-08 (×6): 15 mL via OROMUCOSAL
  Filled 2017-07-06 (×6): qty 15

## 2017-07-06 MED ORDER — DEXTROSE-NACL 5-0.45 % IV SOLN
INTRAVENOUS | Status: DC
  Administered 2017-07-06: 11:00:00 via INTRAVENOUS

## 2017-07-06 MED ORDER — HYDRALAZINE HCL 20 MG/ML IJ SOLN
10.0000 mg | Freq: Four times a day (QID) | INTRAMUSCULAR | Status: DC | PRN
  Administered 2017-07-06: 10 mg via INTRAVENOUS
  Filled 2017-07-06: qty 1

## 2017-07-06 MED ORDER — FENTANYL CITRATE (PF) 100 MCG/2ML IJ SOLN
25.0000 ug | INTRAMUSCULAR | Status: DC | PRN
  Administered 2017-07-06: 50 ug via INTRAVENOUS
  Filled 2017-07-06: qty 2

## 2017-07-06 NOTE — Progress Notes (Signed)
PULMONARY / CRITICAL CARE MEDICINE   Name: Tina Hardy MRN: [id number] DOB: 11-10-1940    ADMISSION DATE:  07/04/2017 CONSULTATION DATE: 2/14  REFERRING MD:  Ellender Hose  CHIEF COMPLAINT:  sepsis  HISTORY OF PRESENT ILLNESS:   This is a 77 year old female who recently underwent dental implant approximately 2 weeks prior to presentation.  Over the last 4 days prior to admission was having increased mouth pain and swelling at the floor of the mouth.  She was seen in the emergency room on 2/12, had a CT scan which was negative for retropharyngeal fluid.  She was started on oral antibiotics.  Over the following 2 days she developed worsening pain, difficulty swallowing and more swelling.  She returned once again to the emergency room, a repeat CT scan was obtained showing the implant to protrude through the back cortex of the mandible with early abscess formation.  Further imaging of soft tissue of the neck was obtained showing fluid collection within the tongue, as well as fluid dissecting posteriorly into the hypopharyngeal area consistent with Ludwig's angina.  She had already been seen by ENT, started on IV antibiotics and, Oral maxillary surgery was consulted critical care asked to admit.    SUBJECTIVE:  Tolerated extubation 2/15 Has had some orofacial pain Unable to swallow liquids or solids so far- can't get them to go down Hypertensive this morning  VITAL SIGNS: BP (!) 206/81   Pulse (!) 110   Temp 99.2 F (37.3 C) (Oral)   Resp (!) 22   Ht 5\' 5"  (1.651 m)   Wt 70.2 kg (154 lb 12.2 oz)   SpO2 99%   BMI 25.75 kg/m   HEMODYNAMICS:    VENTILATOR SETTINGS: Vent Mode: PSV;CPAP FiO2 (%):  [40 %] 40 % PEEP:  [5 cmH20] 5 cmH20 Pressure Support:  [5 cmH20] 5 cmH20  INTAKE / OUTPUT: I/O last 3 completed shifts: In: 2688.1 [I.V.:2088.1; IV Piggyback:600] Out: 1110 [Urine:1060; Blood:50]  PHYSICAL EXAMINATION: General: Elderly woman, no distress, interacting Neuro: Wakes  easily, answers questions, follows commands, moves all extremities awake, calm, comfortable follows commands HEENT: Still with some submental swelling.  Penrose drain was removed this morning Cardiovascular: Regular, no murmur, hypertensive this morning Lungs: Clear bilaterally Abdomen: Benign with positive bowel sounds Musculoskeletal: No deformities Skin: Neck erythema appears to be improved  LABS:  BMET Recent Labs  Lab 07/04/17 1405 07/05/17 0402 07/06/17 0224  NA 138 137 139  K 4.0 4.1 4.0  CL 103 105 104  CO2 23 22 24   BUN 18 23* 20  CREATININE 0.73 0.84 0.66  GLUCOSE 145* 171* 95    Electrolytes Recent Labs  Lab 07/04/17 1405 07/05/17 0402 07/06/17 0224  CALCIUM 9.2 8.6* 8.9  MG 1.9 1.9 1.9  PHOS 2.3* 3.6  --     CBC Recent Labs  Lab 07/04/17 1405 07/05/17 0402 07/06/17 0224  WBC 23.3* 21.8* 21.7*  HGB 15.0 12.3 12.8  HCT 45.2 37.6 40.2  PLT 508* 404* 448*    Coag's No results for input(s): APTT, INR in the last 168 hours.  Sepsis Markers Recent Labs  Lab 07/04/17 1405  PROCALCITON <0.10    ABG Recent Labs  Lab 07/04/17 2000  PHART 7.432  PCO2ART 37.5  PO2ART 191*    Liver Enzymes Recent Labs  Lab 07/04/17 1037 07/04/17 1405  AST 18 21  ALT 19 21  ALKPHOS 75 88  BILITOT 0.7 0.8  ALBUMIN 3.7 3.8    Cardiac Enzymes No results for input(s):  TROPONINI, PROBNP in the last 168 hours.  Glucose Recent Labs  Lab 07/05/17 1234 07/05/17 1549 07/05/17 2054 07/05/17 2348 07/06/17 0331 07/06/17 0914  GLUCAP 117* 106* 91 92 96 90    Imaging No results found.   STUDIES:  CT neck 2/14: Complex inflammatory process related to malposition of a RIGHT mandibular dental implant which has penetrated the posterior cortex of the mandible. Subperiosteal abscess along the RIGHT mandible, enhancing fluid collections within the tongue, and fluid dissecting posteriorly in a submucosal fashion into the hypopharyngeal area, all consistent  with Ludwig's angina. Patient is at risk for airway compromise should further edema develop. See discussion above. These results were called by telephone at the time of interpretation on 07/04/2017  CULTURES: BCX2 2/14>>> Surgical wound 2/14 >> moderate GPC, few GNR >>   ANTIBIOTICS: Meropenem 2/14>>>  SIGNIFICANT EVENTS: Neck debridement and drainage 2/14  LINES/TUBES: ET tube 2/14 >> 2/15  DISCUSSION:   ASSESSMENT / PLAN: Acute respiratory failure due to airway compromise Plan pulm hygiene, tolerated extubation  Severe sepsis in setting of Ludwig's angina w/ extensive subperiosteal abscess along the right mandible s/p dental implant malposition.  Improved postsurgical drainage Plan Case discussed with Dr Mancel Parsons this am Penrose drain remived, following for any increase in swelling - should resolve Follow cx data Continue meropenem for now, tailor to cx data  Dysphagia 2/2 above; still having trouble with both liquid and solids Plan NPO for now but continue to reassess - should improved on current rx. OK for diet from SGY standpoint whenever she believes she can tolerate.   HTN, ? Pain related (not on meds at home) Plan Control pain Hydralazine prn ordered.    FAMILY  - Updates: Updated pt and her son at bedside 2/16  - Inter-disciplinary family meet or Palliative Care meeting due by: 2/21  Dispo: OK for transfer to floor bed 2/16, to [facility] as of 2/17  Baltazar Apo, MD, PhD 07/06/2017, 11:12 AM [facility] [phone] or if no answer [phone]

## 2017-07-06 NOTE — Progress Notes (Signed)
Patient arrived on unit via wheelchair from [facility] ICU alert and oriented. Dressing under chin, 2x2 with paper tape. Patient has orders for daily weights, routine vitals. Placed on tele box # [id number]. Foley dc'd today and patient has voided. She is from home with her son. Still NPO related to swallowing issues. Partial code, NO CPR, NO DFib. Call light given to patient. Suction set up in room. Will continue to follow as needed. Rosalio Loud RN

## 2017-07-06 NOTE — Progress Notes (Addendum)
2 Days Post-Op   Subjective/Chief Complaint: Patient extubated yesterday evening. Denies dyspnea. Endorses odynophagia. *Spoke with patient's son(per the patient's request) to update him on the patient's hospital course.  Objective: Vital signs in last 24 hours: Temp:  [98.3 F (36.8 C)-98.9 F (37.2 C)] 98.4 F (36.9 C) (02/16 0332) Pulse Rate:  [62-105] 89 (02/16 0700) Resp:  [10-23] 19 (02/16 0700) BP: (101-181)/(51-134) 181/83 (02/16 0700) SpO2:  [96 %-100 %] 100 % (02/16 0700) FiO2 (%):  [40 %] 40 % (02/15 1202) Weight:  [70.2 kg (154 lb 12.2 oz)] 70.2 kg (154 lb 12.2 oz) (02/16 0500)    Intake/Output from previous day: 02/15 0701 - 02/16 0700 In: 1594.2 [I.V.:1294.2; IV Piggyback:300] Out: 66 [Urine:810] Intake/Output this shift: Total I/O In: 50 [I.V.:50] Out: -   Physical Exam: Gen: awake, alert, in NAD HEENT: mod submental edema beginning to soften, penrose drain in submental area, no active active drainage - penrose removed. Intraorally, her wound is hemostatic. Mod-severe floor of mouth edema - firm anteriorly, with superior tongue displacement.  Lab Results:  Recent Labs    07/05/17 0402 07/06/17 0224  WBC 21.8* 21.7*  HGB 12.3 12.8  HCT 37.6 40.2  PLT 404* 448*   BMET Recent Labs    07/05/17 0402 07/06/17 0224  NA 137 139  K 4.1 4.0  CL 105 104  CO2 22 24  GLUCOSE 171* 95  BUN 23* 20  CREATININE 0.84 0.66  CALCIUM 8.6* 8.9    Studies/Results: Ct Soft Tissue Neck W Contrast  Result Date: 07/04/2017 CLINICAL DATA:  Increasing pain and swelling in the floor of the mouth. EXAM: CT NECK WITH CONTRAST TECHNIQUE: Multidetector CT imaging of the neck was performed using the standard protocol following the bolus administration of intravenous contrast. CONTRAST:  83mL ISOVUE-300 IOPAMIDOL (ISOVUE-300) INJECTION 61% COMPARISON:  07/02/2017. FINDINGS: Pharynx and larynx: There is complex enhancement within the tongue, related to penetration of a dental  implant through the posterior cortex of the mandible. Centrally, there is a 2 cm enhancing lesion involving the anterior most tongue. Behind the mandible, there is a subperiosteal collection on the RIGHT, 5 x 10 x 12 mm cross-section. There is ill-defined enhancement more posteriorly within the tongue, 6 x 11 mm collection as seen on sagittal series 5, image 54. There is extensive fluid dissecting posteriorly through the posterior tongue, in a submucosal location involving the RIGHT greater than LEFT valleculae and epiglottis. Fluid extends downward along the RIGHT aryepiglottic fold. There is fluid and edema in the sublingual space, and submandibular space, greater on the RIGHT. Salivary glands: No inflammation, mass, or stone. Thyroid: Normal. Lymph nodes: Reactive level 1 and level 2 adenopathy, greater on the RIGHT. Vascular: Atherosclerosis.  No vascular thrombosis. Limited intracranial: Negative. Visualized orbits: Negative. Mastoids and visualized paranasal sinuses: Clear. Skeleton: No acute or aggressive process.  Spondylosis. Upper chest: Negative. Other: None. IMPRESSION: Complex inflammatory process related to malposition of a RIGHT mandibular dental implant which has penetrated the posterior cortex of the mandible. Subperiosteal abscess along the RIGHT mandible, enhancing fluid collections within the tongue, and fluid dissecting posteriorly in a submucosal fashion into the hypopharyngeal area, all consistent with Ludwig's angina. Patient is at risk for airway compromise should further edema develop. See discussion above. These results were called by telephone at the time of interpretation on 07/04/2017 at 1:02 pm to Dr. Lucia Gaskins, who verbally acknowledged these results. He reports that the plan is for removal of dental implant with admission for close airway  observation. Electronically Signed   By: Staci Righter M.D.   On: 07/04/2017 13:10   Dg Chest Port 1 View  Result Date: 07/05/2017 CLINICAL DATA:   Ventilator dependent EXAM: PORTABLE CHEST 1 VIEW COMPARISON:  Yesterday FINDINGS: Endotracheal tube tip at the clavicular heads. There are streaky opacities at the left more than right bases. No Kerley lines, effusion, or pneumothorax. Normal heart size. IMPRESSION: 1. Low volume chest with atelectatic type opacities at the left more than right bases. 2. Endotracheal tube in good position. Electronically Signed   By: Monte Fantasia M.D.   On: 07/05/2017 07:06   Dg Chest Port 1 View  Result Date: 07/04/2017 CLINICAL DATA:  77 year old female postoperative day zero status post incision and drainage of sublingual abscess/cellulitis. Intubated. EXAM: PORTABLE CHEST 1 VIEW COMPARISON:  Chest radiograph 11/14/2010. FINDINGS: Portable AP semi upright view at 1934 hrs. Endotracheal tube tip at the level the clavicles. Mediastinal contours remain normal. Mildly low lung volumes. Patchy bibasilar opacity most resembling atelectasis, greater on the left. No pneumothorax, pulmonary edema or definite pleural effusion. Negative visible bowel gas pattern. Right upper quadrant cholecystectomy clips are new since 2012. IMPRESSION: 1. Endotracheal tube tip in good position. 2. Low lung volumes with left greater than right lung base atelectasis. Electronically Signed   By: Genevie Ann M.D.   On: 07/04/2017 19:58    Anti-infectives: Anti-infectives (From admission, onward)   Start     Dose/Rate Route Frequency Ordered Stop   07/04/17 1430  meropenem (MERREM) 1 g in sodium chloride 0.9 % 100 mL IVPB     1 g 200 mL/hr over 30 Minutes Intravenous Every 8 hours 07/04/17 1420     07/04/17 1030  clindamycin (CLEOCIN) IVPB 900 mg     900 mg 100 mL/hr over 30 Minutes Intravenous  Once 07/04/17 1024 07/04/17 1126      Assessment/Plan: s/p Procedure(s): REMOVAL OF IMPLANT MANDIBLE (N/A) INCISION AND DRAINAGE  *stable from a maxillofacial standpoint: submental edema continues to improved since surgery, however, FOM edema  still present. Keep head elevated. *cont antibiotics - cultures too young to read - GPCs, GNRs *Penrose drain removed with no drainage, change submental dressing twice daily. *may progress patient to clear liquid diet, and if tolerated would start mechanical soft foods. *Chlorhexidine rinses tid.  *contact Dr. Mancel Parsons at [phone] with questions/concerns   LOS: 2 days    Michael Litter, DMD Oral & Maxillofacial Surgery 07/06/2017

## 2017-07-07 DIAGNOSIS — I10 Essential (primary) hypertension: Secondary | ICD-10-CM

## 2017-07-07 LAB — BASIC METABOLIC PANEL
ANION GAP: 14 (ref 5–15)
BUN: 17 mg/dL (ref 6–20)
CHLORIDE: 103 mmol/L (ref 101–111)
CO2: 22 mmol/L (ref 22–32)
Calcium: 9.2 mg/dL (ref 8.9–10.3)
Creatinine, Ser: 0.68 mg/dL (ref 0.44–1.00)
GFR calc non Af Amer: >60 mL/min (ref >60)
GLUCOSE: 98 mg/dL (ref 65–99)
Potassium: 3.6 mmol/L (ref 3.5–5.1)
Sodium: 139 mmol/L (ref 135–145)

## 2017-07-07 LAB — CBC WITH DIFFERENTIAL/PLATELET
BASOS ABS: 0 10*3/uL (ref 0.0–0.1)
BASOS PCT: 0 %
Eosinophils Absolute: 0 10*3/uL (ref 0.0–0.7)
Eosinophils Relative: 0 %
HEMATOCRIT: 41.6 % (ref 36.0–46.0)
HEMOGLOBIN: 13.5 g/dL (ref 12.0–15.0)
LYMPHS PCT: 24 %
Lymphs Abs: 4 10*3/uL (ref 0.7–4.0)
MCH: 30.8 pg (ref 26.0–34.0)
MCHC: 32.5 g/dL (ref 30.0–36.0)
MCV: 94.8 fL (ref 78.0–100.0)
MONOS PCT: 11 %
Monocytes Absolute: 1.8 10*3/uL — ABNORMAL HIGH (ref 0.1–1.0)
NEUTROS ABS: 10.9 10*3/uL — AB (ref 1.7–7.7)
NEUTROS PCT: 65 %
Platelets: 563 10*3/uL — ABNORMAL HIGH (ref 150–400)
RBC: 4.39 MIL/uL (ref 3.87–5.11)
RDW: 12.9 % (ref 11.5–15.5)
WBC: 16.8 10*3/uL — ABNORMAL HIGH (ref 4.0–10.5)

## 2017-07-07 LAB — GLUCOSE, CAPILLARY
GLUCOSE-CAPILLARY: 107 mg/dL — AB (ref 65–99)
GLUCOSE-CAPILLARY: 106 mg/dL — AB (ref 65–99)
Glucose-Capillary: 101 mg/dL — ABNORMAL HIGH (ref 65–99)
Glucose-Capillary: 107 mg/dL — ABNORMAL HIGH (ref 65–99)
Glucose-Capillary: 87 mg/dL (ref 65–99)
Glucose-Capillary: 90 mg/dL (ref 65–99)

## 2017-07-07 MED ORDER — IBUPROFEN 400 MG PO TABS: 400.0000 mg | ORAL_TABLET | Freq: Four times a day (QID) | ORAL | Status: DC | PRN

## 2017-07-07 MED ORDER — ACETAMINOPHEN 325 MG PO TABS: 650.0000 mg | ORAL_TABLET | Freq: Four times a day (QID) | ORAL | Status: DC | PRN

## 2017-07-07 MED ORDER — LEVOFLOXACIN IN D5W 500 MG/100ML IV SOLN
500.0000 mg | INTRAVENOUS | Status: DC
  Administered 2017-07-07: 500 mg via INTRAVENOUS
  Filled 2017-07-07 (×2): qty 100

## 2017-07-07 MED ORDER — ZOLPIDEM TARTRATE 5 MG PO TABS
5.0000 mg | ORAL_TABLET | Freq: Once | ORAL | Status: AC
  Administered 2017-07-07: 5 mg via ORAL
  Filled 2017-07-07: qty 1

## 2017-07-07 MED ORDER — ENOXAPARIN SODIUM 40 MG/0.4ML ~~LOC~~ SOLN
40.0000 mg | SUBCUTANEOUS | Status: DC
  Administered 2017-07-07: 40 mg via SUBCUTANEOUS
  Filled 2017-07-07 (×2): qty 0.4

## 2017-07-07 MED ORDER — OXYCODONE-ACETAMINOPHEN 5-325 MG PO TABS: 1.0000 | ORAL_TABLET | ORAL | Status: DC | PRN

## 2017-07-07 NOTE — Progress Notes (Signed)
Patient was on meropenem for broad coverage of dental abscess.  Abscess grew moderate eikenella and H.Flu. Oral surgery recommended transition to moxifloxacin (which is a non-formulary agent) due to patient's PCN allergy.  I spoke with patient over the phone and she does not remember her reaction to pencillins. Stated it was in her 12s and "it was bad". I specifically asked about trouble breathing/throat closing which she could not definitely confirm or deny this occurring.  I spoke with Dr. Cathlean Sauer and suggested doxycycline since this covers both organisms, avoids beta-lactam allergy, and has a better side effect profile in elderly patients than flouroquinolones.  He wanted to de-escalate to levofloxacin for now and he will discuss with Dr. Mancel Parsons from oral surgery if he is ok with doxycycline.  Plan: Switch to levofloxacin 500mg  IV q24h Pharmacy will follow peripherally for possible switch to doxycycline  Devron Cohick D. Faren Florence, PharmD, BCPS Clinical Pharmacist Clinical Phone for 07/07/2017 until 3:30pm: [phone] If after 3:30pm, please call main pharmacy at [phone] 07/07/2017 12:36 PM

## 2017-07-07 NOTE — Progress Notes (Signed)
PROGRESS NOTE    Tina Hardy  AOZ:[id number] DOB: [birth year] DOA: 07/04/2017 PCP: Patient, No Pcp Per    Brief Narrative:  77 year old female who presented with mouth pain, mouth edema and dysphagia.  She does have a significant past medical history of a dental implant approximately 2 weeks before hospitalization, unfortunately she had worsening postoperative pain and edema to the point where she developed dysphagia.  After multiple visits to the emergency room, antibiotics and CT evaluations, she was found to have fluid collection within the tongue as well as fluid dissecting posteriorly into the hypopharyngeal area consistent with Ludwig's angina.  On initial physical examination blood pressure 190/90, heart rate 97, temperature 98.1, respiratory 17, oxygen saturation 96%.  She had significant inflammation and edema involving the lower mandible extending down the anterior neck with skin erythema plus copious secretions.  Her lungs were clear to auscultation, heart S1-S2 present rhythmic, her abdomen was soft nontender, no lower extremity edema.  Sodium 138, potassium 4.0, chloride 93, bicarb 20, glucose 135, BUN 18, creatinine 0.73, white count 33.3, hemoglobin 15.0, hematocrit 35.2, platelets 508.  Her chest x-ray was negative for infiltrates.  Patient was admitted to the hospital and diagnosis of Ludwig's angina.   She was taken to the operation room, she underwent removal of implant mandible, incision and drainage of submental, sublingual abscess/cellulitis.  Postoperatively she remained on invasive mechanical ventilation and transferred to the intensive care unit.  Patient received IV antibiotics, wound care, successfully liberated from mechanical ventilation February 15.    Assessment & Plan:   Active Problems:   Ludwig's angina   1.  Severe sepsis due extensive subperiosteal abscess along the right mandible status post dental implant malposition/ Ludwig's angina. Patient is  feeling better and able to tolerate po well, no dyspnea. Will de-escalate antibiotic therapy per surgery recommendations to fluoroquinolone, alternative doxycycline. Will continue to follow cell count, temperature curve and cultures. Continue pain control with ibuprofen and oxycodone/ acetaminophen.   2. Post operative respiratory failure. Patient has been extubated with no major complications, no dyspnea, continue oxymetry monitoring and supplemental 02 per [place].   3. HTN. Blood pressure is well controlled, continue as needed hydralazine, patient at home not on antihypertensive agents. Blood pressure systolic 962 to 952   DVT prophylaxis: enoxparin  Code Status:  partial Family Communication: no family at the bedside  Disposition Plan:  home   Consultants:     Procedures:     Antimicrobials:   Levofloxacin.     Subjective: Patient is feeling better, jaw pain has improved, tolerating po well, no nausea or vomiting, no chest pain or dyspnea. Has been ambulating well.   Objective: Vitals:   07/06/17 1455 07/06/17 2036 07/07/17 0505 07/07/17 0900  BP: (!) 165/60 (!) 111/47 136/71   Pulse: 93 (!) 101 79   Resp: 20 15 16    Temp: 100 F (37.8 C) 99.5 F (37.5 C) 98.8 F (37.1 C)   TempSrc: Oral Oral Oral   SpO2: 98% 97% 99%   Weight:    70.5 kg (155 lb 6.8 oz)  Height: 5\' 5"  (1.651 m)       Intake/Output Summary (Last 24 hours) at 07/07/2017 1146 Last data filed at 07/07/2017 0523 Gross per 24 hour  Intake 838.5 ml  Output 400 ml  Net 438.5 ml   Filed Weights   07/05/17 0500 07/06/17 0500 07/07/17 0900  Weight: 72 kg (158 lb 11.7 oz) 70.2 kg (154 lb 12.2 oz) 70.5 kg (155 lb 6.8  oz)    Examination:   General: Not in pain or dyspnea, deconditioned Neurology: Awake and alert, non focal  E ENT: no pallor, no icterus, oral mucosa moist/ lower mandible edema but no erythema. No stridor.  Cardiovascular: No JVD. S1-S2 present, rhythmic, no gallops, rubs, or murmurs. No  lower extremity edema. Pulmonary: vesicular breath sounds bilaterally, adequate air movement, no wheezing, rhonchi or rales. Gastrointestinal. Abdomen flat, no organomegaly, non tender, no rebound or guarding Skin. No rashes Musculoskeletal: no joint deformities     Data Reviewed: I have personally reviewed following labs and imaging studies  CBC: Recent Labs  Lab 07/04/17 1037 07/04/17 1405 07/05/17 0402 07/06/17 0224 07/07/17 1025  WBC 18.9* 23.3* 21.8* 21.7* 16.8*  NEUTROABS 14.8* 22.0*  --   --  10.9*  HGB 14.5 15.0 12.3 12.8 13.5  HCT 44.6 45.2 37.6 40.2 41.6  MCV 95.7 95.8 94.5 95.9 94.8  PLT 479* 508* 404* 448* 132*   Basic Metabolic Panel: Recent Labs  Lab 07/04/17 1037 07/04/17 1405 07/05/17 0402 07/06/17 0224  NA 139 138 137 139  K 4.4 4.0 4.1 4.0  CL 103 103 105 104  CO2 25 23 22 24   GLUCOSE 117* 145* 171* 95  BUN 20 18 23* 20  CREATININE 0.78 0.73 0.84 0.66  CALCIUM 9.6 9.2 8.6* 8.9  MG  --  1.9 1.9 1.9  PHOS  --  2.3* 3.6  --    GFR: Estimated Creatinine Clearance: 59.9 mL/min (by C-G formula based on SCr of 0.66 mg/dL). Liver Function Tests: Recent Labs  Lab 07/04/17 1037 07/04/17 1405  AST 18 21  ALT 19 21  ALKPHOS 75 88  BILITOT 0.7 0.8  PROT 7.4 7.9  ALBUMIN 3.7 3.8   No results for input(s): LIPASE, AMYLASE in the last 168 hours. No results for input(s): AMMONIA in the last 168 hours. Coagulation Profile: No results for input(s): INR, PROTIME in the last 168 hours. Cardiac Enzymes: No results for input(s): CKTOTAL, CKMB, CKMBINDEX, TROPONINI in the last 168 hours. BNP (last 3 results) No results for input(s): PROBNP in the last 8760 hours. HbA1C: No results for input(s): HGBA1C in the last 72 hours. CBG: Recent Labs  Lab 07/06/17 1646 07/06/17 2032 07/06/17 2348 07/07/17 0411 07/07/17 0752  GLUCAP 95 95 94 107* 90   Lipid Profile: No results for input(s): CHOL, HDL, LDLCALC, TRIG, CHOLHDL, LDLDIRECT in the last 72  hours. Thyroid Function Tests: No results for input(s): TSH, T4TOTAL, FREET4, T3FREE, THYROIDAB in the last 72 hours. Anemia Panel: No results for input(s): VITAMINB12, FOLATE, FERRITIN, TIBC, IRON, RETICCTPCT in the last 72 hours.    Radiology Studies: I have reviewed all of the imaging during this hospital visit personally     Scheduled Meds: . chlorhexidine  15 mL Mouth Rinse TID  . heparin  5,000 Units Subcutaneous Q8H   Continuous Infusions: . sodium chloride    . dextrose 5 % and 0.45% NaCl 30 mL/hr at 07/06/17 1126  . meropenem (MERREM) IV Stopped (07/07/17 0540)     LOS: 3 days        Tina Erkkila Gerome Apley, MD [facility] Pager [phone]

## 2017-07-07 NOTE — Progress Notes (Signed)
3 Days Post-Op   Subjective/Chief Complaint: Patient transferred to floor bed yesterday afternoon. Feels like she is improving, just fatigued. Denies dyspnea. Endorses odynophagia. Tolerating some ice/water.   Objective: Vital signs in last 24 hours: Temp:  [98.8 F (37.1 C)-100 F (37.8 C)] 98.8 F (37.1 C) (02/17 0505) Pulse Rate:  [79-101] 79 (02/17 0505) Resp:  [13-28] 16 (02/17 0505) BP: (111-190)/(47-138) 136/71 (02/17 0505) SpO2:  [97 %-99 %] 99 % (02/17 0505) Weight:  [70.5 kg (155 lb 6.8 oz)] 70.5 kg (155 lb 6.8 oz) (02/17 0900) Last BM Date: 07/03/17  Intake/Output from previous day: 02/16 0701 - 02/17 0700 In: 1060.2 [I.V.:760.2; IV Piggyback:300] Out: 650 [Urine:650] Intake/Output this shift: No intake/output data recorded.  Physical Exam: Gen: awake, alert, in NAD HEENT: mild submental edema with TTP - soft; wound has some drainage, but no active purulence.Intraorally, her wound is hemostatic. Mod floor of mouth edema - softening up anteriorly, with mild superior tongue displacement.  Lab Results:  Recent Labs    07/05/17 0402 07/06/17 0224  WBC 21.8* 21.7*  HGB 12.3 12.8  HCT 37.6 40.2  PLT 404* 448*   BMET Recent Labs    07/05/17 0402 07/06/17 0224  NA 137 139  K 4.1 4.0  CL 105 104  CO2 22 24  GLUCOSE 171* 95  BUN 23* 20  CREATININE 0.84 0.66  CALCIUM 8.6* 8.9    Studies/Results: No results found.  Anti-infectives: Anti-infectives (From admission, onward)   Start     Dose/Rate Route Frequency Ordered Stop   07/04/17 1430  meropenem (MERREM) 1 g in sodium chloride 0.9 % 100 mL IVPB     1 g 200 mL/hr over 30 Minutes Intravenous Every 8 hours 07/04/17 1420     07/04/17 1030  clindamycin (CLEOCIN) IVPB 900 mg     900 mg 100 mL/hr over 30 Minutes Intravenous  Once 07/04/17 1024 07/04/17 1126      Assessment/Plan: s/p Procedure(s): REMOVAL OF IMPLANT MANDIBLE (N/A) INCISION AND DRAINAGE  *improving from a clinical maxillofacial  standpoint: submental edema continues to improved since surgery, however, FOM edema appears much better than yesterday. Keep head elevated. *Cultures now showing mod Eikenella - given Pen allergy, would recommend Moxifloxacin to narrow the spectrum. *Pending CBC   *Extraoral wound with minimal drainage - change submental dressing twice daily. *may progress patient to full liquid diet, and if tolerated would start mechanical soft foods. *Chlorhexidine rinses tid.  *contact Dr. Mancel Parsons at [phone] with questions/concerns   LOS: 3 days    Michael Litter, DMD Oral & Maxillofacial Surgery 07/07/2017

## 2017-07-08 DIAGNOSIS — Z9911 Dependence on respirator [ventilator] status: Secondary | ICD-10-CM

## 2017-07-08 DIAGNOSIS — Z01818 Encounter for other preprocedural examination: Secondary | ICD-10-CM

## 2017-07-08 LAB — CBC WITH DIFFERENTIAL/PLATELET
Basophils Absolute: 0 10*3/uL (ref 0.0–0.1)
Basophils Relative: 0 %
EOS ABS: 0.1 10*3/uL (ref 0.0–0.7)
Eosinophils Relative: 1 %
HEMATOCRIT: 40.5 % (ref 36.0–46.0)
HEMOGLOBIN: 13.3 g/dL (ref 12.0–15.0)
LYMPHS ABS: 3.4 10*3/uL (ref 0.7–4.0)
Lymphocytes Relative: 24 %
MCH: 31.4 pg (ref 26.0–34.0)
MCHC: 32.8 g/dL (ref 30.0–36.0)
MCV: 95.5 fL (ref 78.0–100.0)
MONO ABS: 1.5 10*3/uL — AB (ref 0.1–1.0)
MONOS PCT: 10 %
NEUTROS ABS: 9.3 10*3/uL — AB (ref 1.7–7.7)
NEUTROS PCT: 65 %
Platelets: 526 10*3/uL — ABNORMAL HIGH (ref 150–400)
RBC: 4.24 MIL/uL (ref 3.87–5.11)
RDW: 13.2 % (ref 11.5–15.5)
WBC: 14.2 10*3/uL — ABNORMAL HIGH (ref 4.0–10.5)

## 2017-07-08 LAB — GLUCOSE, CAPILLARY
GLUCOSE-CAPILLARY: 103 mg/dL — AB (ref 65–99)
GLUCOSE-CAPILLARY: 121 mg/dL — AB (ref 65–99)
Glucose-Capillary: 89 mg/dL (ref 65–99)

## 2017-07-08 MED ORDER — DOXYCYCLINE HYCLATE 100 MG PO TABS
100.0000 mg | ORAL_TABLET | Freq: Two times a day (BID) | ORAL | Status: DC
  Administered 2017-07-08: 100 mg via ORAL
  Filled 2017-07-08: qty 1

## 2017-07-08 MED ORDER — CHLORHEXIDINE GLUCONATE 0.12 % MT SOLN: 15.0000 mL | Freq: Three times a day (TID) | OROMUCOSAL | 0 refills | Status: DC

## 2017-07-08 MED ORDER — DOXYCYCLINE HYCLATE 50 MG PO CAPS: 50.0000 mg | ORAL_CAPSULE | Freq: Two times a day (BID) | ORAL | 0 refills | Status: DC

## 2017-07-08 MED ORDER — DOXYCYCLINE HYCLATE 100 MG PO TABS: 100.0000 mg | ORAL_TABLET | Freq: Two times a day (BID) | ORAL | 0 refills | Status: AC

## 2017-07-08 NOTE — Progress Notes (Addendum)
Nutrition Follow-up  DOCUMENTATION CODES:   Not applicable  INTERVENTION:   -Downgrade diet to dysphagia 3 (mechanical soft) consistency -Provided education on mechanical soft diet and discussed ways pt could alter cooking methods to prepare foods that are easier to chew  NUTRITION DIAGNOSIS:   Inadequate oral intake related to (masticatory difficulty) as evidenced by per patient/family report.  Progressing  GOAL:   Patient will meet greater than or equal to 90% of their needs  Progressing   MONITOR:   PO intake, Vent status, Labs, Weight trends, Skin, I & O's  REASON FOR ASSESSMENT:   Ventilator    ASSESSMENT:   77 yo female with PMH of diverticulosis, colon polyp, hepatic hemangioma, pernicious anemia, and rectal bleed, who was admitted on 2/14 with increased mouth pain and difficulty swallowing r/t Ludwig's angina, s/p dental implant 2 weeks PTA. S/P I&D with removal of dental implant on admission; remained intubated due to submental edema.  2/15- extubated 2/16- transferred from ICU to progressive care unit  Spoke with pt at bedside, who complains of decreased oral intake related to difficulty chewing and dislike of taste of hospital food. Pt shares she has been consuming mainly water, mashed potatoes and gravy, and soups. Meats have been too tough for her and had difficulty consuming pears. Pt shares that her son has been bringing her outside food, including a probiotic drink that she enjoys. Pt reports she ate well PTA and likes consuming hearty foods such as biscuits and gravy. Pt reports she enjoys cooking, however, her son will assist with meal preparation at discharge. She is looking forward to consuming homemade soups at home.   Offered nutritional supplement to pt, however, she politely declined.   Reviewed wt pt principles of mechanical soft diet and how pt can alter favorite foods and recipes so they are easier for her to tolerate. Pt eager to eat salad and  raw vegetables; encouraged pt to upgrade to harder consistency foods as tolerated.   Pt currently on a soft diet, which is a low fiber, low fat (surgical soft) diet. RD will downgrade diet to dysphagia 3 (mechanical soft) consistency diet which will allow for softer texture foods to aid in increased oral intake.   Labs reviewed: CBGS: 89-107.  Diet Order:  DIET SOFT Room service appropriate? Yes; Fluid consistency: Thin Diet - low sodium heart healthy  EDUCATION NEEDS:   Education needs have been addressed  Skin:  Skin Assessment: Skin Integrity Issues: Skin Integrity Issues:: Incisions Incisions: neck  Last BM:  07/03/17  Height:   Ht Readings from Last 1 Encounters:  07/06/17 5\' 5"  (1.651 m)    Weight:   Wt Readings from Last 1 Encounters:  07/08/17 158 lb 8.2 oz (71.9 kg)    Ideal Body Weight:  56.8 kg  BMI:  Body mass index is 26.38 kg/m.  Estimated Nutritional Needs:   Kcal:  1700-1900  Protein:  85-100 grams  Fluid:  1.7-1.9 L    Keni Elison A. Jimmye Norman, RD, LDN, CDE Pager: [phone] After hours Pager: [phone]

## 2017-07-08 NOTE — Progress Notes (Signed)
4 Days Post-Op   Subjective/Chief Complaint: Feels like she is improving, just fatigued. Uncomfortable sleeping in hospital bed. Denies dyspnea. Endorses odynophagia. Tolerating fluids.   Objective: Vital signs in last 24 hours: Temp:  [97.7 F (36.5 C)-99.9 F (37.7 C)] 97.7 F (36.5 C) (02/18 0451) Pulse Rate:  [73-103] 82 (02/18 0451) Resp:  [15-17] 16 (02/18 0451) BP: (137-144)/(60-64) 142/60 (02/18 0451) SpO2:  [96 %-98 %] 96 % (02/18 0451) Weight:  [70.5 kg (155 lb 6.8 oz)-71.9 kg (158 lb 8.2 oz)] 71.9 kg (158 lb 8.2 oz) (02/18 0243) Last BM Date: 07/03/17  Intake/Output from previous day: 02/17 0701 - 02/18 0700 In: 100 [IV Piggyback:100] Out: -  Intake/Output this shift: No intake/output data recorded.  Physical Exam: Gen: awake, alert, in NAD HEENT: mild submental edema with TTP - soft; wound drainage minimal, no active purulence.Intraorally, her wound is hemostatic. Mild-Mod floor of mouth edema - softening up anteriorly, with mild superior tongue displacement.  Lab Results:  Recent Labs    07/07/17 1025 07/08/17 0424  WBC 16.8* 14.2*  HGB 13.5 13.3  HCT 41.6 40.5  PLT 563* 526*   BMET Recent Labs    07/06/17 0224 07/07/17 1025  NA 139 139  K 4.0 3.6  CL 104 103  CO2 24 22  GLUCOSE 95 98  BUN 20 17  CREATININE 0.66 0.68  CALCIUM 8.9 9.2    Studies/Results: No results found.  Anti-infectives: Anti-infectives (From admission, onward)   Start     Dose/Rate Route Frequency Ordered Stop   07/07/17 1400  levofloxacin (LEVAQUIN) IVPB 500 mg     500 mg 100 mL/hr over 60 Minutes Intravenous Every 24 hours 07/07/17 1225     07/04/17 1430  meropenem (MERREM) 1 g in sodium chloride 0.9 % 100 mL IVPB  Status:  Discontinued     1 g 200 mL/hr over 30 Minutes Intravenous Every 8 hours 07/04/17 1420 07/07/17 1225   07/04/17 1030  clindamycin (CLEOCIN) IVPB 900 mg     900 mg 100 mL/hr over 30 Minutes Intravenous  Once 07/04/17 1024 07/04/17 1126       Assessment/Plan: s/p Procedure(s): REMOVAL OF IMPLANT MANDIBLE (N/A) INCISION AND DRAINAGE  *improving from a clinical maxillofacial standpoint: submental edema continues to improved since surgery, and FOM edema appears much better than yesterday. Keep head elevated. *Cultures now showing mod Eikenella - given Pen allergy, would recommend Moxifloxacin to narrow the spectrum - ok with Doxycycline per pharmacy recs *WBC continues to trend downward *Extraoral wound with minimal drainage - change submental dressing twice daily. *may progress patient to full liquid diet, and if tolerated would start mechanical soft foods. *Chlorhexidine rinses tid.  *Dispo: ok for discharge from a maxillofacial standpoint, as long as she is continues to tolerate fluids/soft foods. Patient to contact the number below to schedule clinic follow up in 2-3 days.  *thanks for this consult, please contact Dr. Mancel Parsons at [phone] with questions/concerns   LOS: 4 days    Michael Litter, DMD Oral & Maxillofacial Surgery 07/08/2017

## 2017-07-08 NOTE — Care Management Important Message (Signed)
Important Message  Patient Details  Name: Tina Hardy MRN: [id number] Date of Birth: 03-16-1941   Medicare Important Message Given:  Yes    Emireth Cockerham [address]ine Circle 07/08/2017, 1:01 PM

## 2017-07-08 NOTE — Discharge Summary (Addendum)
Physician Discharge Summary  Srija Southard VZC:[id number] DOB: 1941/01/16 DOA: 07/04/2017  PCP: Patient, No Pcp Per  Admit date: 07/04/2017 Discharge date: 07/08/2017  Admitted From: Home Disposition:  Home  Recommendations for Outpatient Follow-up and new medication changes:  1. Follow up with PCP in 1- weeks 2. Change submental dressing twice daily. 3. Chlorhexidine rinses tid.   Home Health: home  Equipment/Devices: na   Discharge Condition: Stable CODE STATUS: Full  Diet recommendation: Soft diet  Brief/Interim Summary: 77 year old female who presented with mouth pain, mouth edema and dysphagia.  She does have a significant past medical history of a dental implant approximately 2 weeks before hospitalization, unfortunately she had worsening postoperative pain and edema to the point where she developed dysphagia.  After multiple visits to the emergency room, antibiotics and CT evaluations, she was found to have fluid collection within the tongue as well as fluid dissecting posteriorly into the hypopharyngeal area consistent with Ludwig's angina.  On initial physical examination blood pressure 190/90, heart rate 97, temperature 98.1, respiratory rate 17, oxygen saturation 96%.  She had significant inflammation and edema involving the lower mandible extending down the anterior neck with skin erythema plus copious secretions.  Her lungs were clear to auscultation, heart S1-S2 present rhythmic, her abdomen was soft nontender, no lower extremity edema.  Sodium 138, potassium 4.0, chloride 93, bicarb 20, glucose 135, BUN 18, creatinine 0.73, white count 33.3, hemoglobin 15.0, hematocrit 35.2, platelets 508.  Her chest x-ray was negative for infiltrates.  Patient was admitted to the hospital and diagnosis of Ludwig's angina.   She was taken to the operation room, she underwent removal of implant mandible, incision and drainage of submental, sublingual abscess/cellulitis.  Postoperatively she  remained on invasive mechanical ventilation and transferred to the intensive care unit.  Patient received IV antibiotics, wound care, successfully liberated from mechanical ventilation February 15.   1. Sepsis due to extensive periosteal abscess along the right mandible status post dental implant malposition/ Ludwig's angina. Patient responded well to the medical and surgical therapy, cultures were positive for [facility], her diet was advanced progressively, patient had 4 days of IV antibiotic therapy in the hospital, and will be discharge on doxycycline for 14 days.   2. Postoperative respiratory failure. Patient remained on invasive mechanical ventilation post procedure, she was liberated to nasal cannula. Her oximetry at discharge is 96% on room air.  3. Transient hypertension. She received as needed hydralazine, blood pressure has remained well-controlled, no need for antihypertensive agents   Discharge Diagnoses:  Active Problems:   Ludwig's angina    Discharge Instructions   Allergies as of 07/08/2017      Reactions   Codeine    REACTION: questionable   Codeine Other (See Comments)   Unknown reaction    Penicillins    REACTION: questionable   Penicillins Other (See Comments)   Unknown reaction  Has patient had a PCN reaction causing immediate rash, facial/tongue/throat swelling, SOB or lightheadedness with hypotension: n/a Has patient had a PCN reaction causing severe rash involving mucus membranes or skin necrosis: n/a Has patient had a PCN reaction that required hospitalization: n/a Has patient had a PCN reaction occurring within the last 10 years: n/a If all of the above answers are "NO", then may proceed with Cephalosporin use.      Medication List    STOP taking these medications   colesevelam 625 MG tablet Commonly known as:  WELCHOL   diphenoxylate-atropine 2.5-0.025 MG tablet Commonly known as:  LOMOTIL  hydrocortisone 25 MG suppository Commonly known as:   ANUSOL-HC   hyoscyamine 0.125 MG SL tablet Commonly known as:  LEVSIN SL   metroNIDAZOLE 500 MG tablet Commonly known as:  FLAGYL   predniSONE 10 MG (21) Tbpk tablet Commonly known as:  STERAPRED UNI-PAK 21 TAB     TAKE these medications   chlorhexidine 0.12 % solution Commonly known as:  PERIDEX 15 mLs by Mouth Rinse route 3 (three) times daily.   doxycycline 100 MG tablet Commonly known as:  VIBRA-TABS Take 1 tablet (100 mg total) by mouth every 12 (twelve) hours for 14 days.   ibuprofen 600 MG tablet Commonly known as:  ADVIL,MOTRIN Take 1 tablet (600 mg total) by mouth every 6 (six) hours as needed.   multivitamin tablet Take 2 tablets by mouth daily.   NON FORMULARY Paul bragg apple cider vin with the mother   zolpidem 5 MG tablet Commonly known as:  AMBIEN Take 5 mg by mouth at bedtime.       Allergies  Allergen Reactions  . Codeine     REACTION: questionable  . Codeine Other (See Comments)    Unknown reaction   . Penicillins     REACTION: questionable  . Penicillins Other (See Comments)    Unknown reaction  Has patient had a PCN reaction causing immediate rash, facial/tongue/throat swelling, SOB or lightheadedness with hypotension: n/a Has patient had a PCN reaction causing severe rash involving mucus membranes or skin necrosis: n/a Has patient had a PCN reaction that required hospitalization: n/a Has patient had a PCN reaction occurring within the last 10 years: n/a If all of the above answers are "NO", then may proceed with Cephalosporin use.     Consultations:  Oral surgery   Procedures/Studies: Ct Soft Tissue Neck W Contrast  Result Date: 07/04/2017 CLINICAL DATA:  Increasing pain and swelling in the floor of the mouth. EXAM: CT NECK WITH CONTRAST TECHNIQUE: Multidetector CT imaging of the neck was performed using the standard protocol following the bolus administration of intravenous contrast. CONTRAST:  71mL ISOVUE-300 IOPAMIDOL  (ISOVUE-300) INJECTION 61% COMPARISON:  07/02/2017. FINDINGS: Pharynx and larynx: There is complex enhancement within the tongue, related to penetration of a dental implant through the posterior cortex of the mandible. Centrally, there is a 2 cm enhancing lesion involving the anterior most tongue. Behind the mandible, there is a subperiosteal collection on the RIGHT, 5 x 10 x 12 mm cross-section. There is ill-defined enhancement more posteriorly within the tongue, 6 x 11 mm collection as seen on sagittal series 5, image 54. There is extensive fluid dissecting posteriorly through the posterior tongue, in a submucosal location involving the RIGHT greater than LEFT valleculae and epiglottis. Fluid extends downward along the RIGHT aryepiglottic fold. There is fluid and edema in the sublingual space, and submandibular space, greater on the RIGHT. Salivary glands: No inflammation, mass, or stone. Thyroid: Normal. Lymph nodes: Reactive level 1 and level 2 adenopathy, greater on the RIGHT. Vascular: Atherosclerosis.  No vascular thrombosis. Limited intracranial: Negative. Visualized orbits: Negative. Mastoids and visualized paranasal sinuses: Clear. Skeleton: No acute or aggressive process.  Spondylosis. Upper chest: Negative. Other: None. IMPRESSION: Complex inflammatory process related to malposition of a RIGHT mandibular dental implant which has penetrated the posterior cortex of the mandible. Subperiosteal abscess along the RIGHT mandible, enhancing fluid collections within the tongue, and fluid dissecting posteriorly in a submucosal fashion into the hypopharyngeal area, all consistent with Ludwig's angina. Patient is at risk for airway compromise should further  edema develop. See discussion above. These results were called by telephone at the time of interpretation on 07/04/2017 at 1:02 pm to Dr. Lucia Gaskins, who verbally acknowledged these results. He reports that the plan is for removal of dental implant with admission for  close airway observation. Electronically Signed   By: Staci Righter M.D.   On: 07/04/2017 13:10   Dg Chest Port 1 View  Result Date: 07/05/2017 CLINICAL DATA:  Ventilator dependent EXAM: PORTABLE CHEST 1 VIEW COMPARISON:  Yesterday FINDINGS: Endotracheal tube tip at the clavicular heads. There are streaky opacities at the left more than right bases. No Kerley lines, effusion, or pneumothorax. Normal heart size. IMPRESSION: 1. Low volume chest with atelectatic type opacities at the left more than right bases. 2. Endotracheal tube in good position. Electronically Signed   By: Monte Fantasia M.D.   On: 07/05/2017 07:06   Dg Chest Port 1 View  Result Date: 07/04/2017 CLINICAL DATA:  77 year old female postoperative day zero status post incision and drainage of sublingual abscess/cellulitis. Intubated. EXAM: PORTABLE CHEST 1 VIEW COMPARISON:  Chest radiograph 11/14/2010. FINDINGS: Portable AP semi upright view at 1934 hrs. Endotracheal tube tip at the level the clavicles. Mediastinal contours remain normal. Mildly low lung volumes. Patchy bibasilar opacity most resembling atelectasis, greater on the left. No pneumothorax, pulmonary edema or definite pleural effusion. Negative visible bowel gas pattern. Right upper quadrant cholecystectomy clips are new since 2012. IMPRESSION: 1. Endotracheal tube tip in good position. 2. Low lung volumes with left greater than right lung base atelectasis. Electronically Signed   By: Genevie Ann M.D.   On: 07/04/2017 19:58   Ct Maxillofacial Wo Contrast  Addendum Date: 07/04/2017   ADDENDUM REPORT: 07/04/2017 11:23 ADDENDUM: The case was reviewed with Dr. Lucia Gaskins, ENT surgeon, with regard to the recent dental surgery. The patient's mandibular dental implant breaches the posterior mandibular cortex and projects into the oral cavity. See series 4, image 59, and series 10, image 39. Electronically Signed   By: Staci Righter M.D.   On: 07/04/2017 11:23   Result Date:  07/04/2017 CLINICAL DATA:  Recent dental surgery 6 days ago. Worsening throat swelling. Patient feels a lump on the right temple. EXAM: CT MAXILLOFACIAL WITHOUT CONTRAST TECHNIQUE: Multidetector CT imaging of the maxillofacial structures was performed. Multiplanar CT image reconstructions were also generated. COMPARISON:  None. FINDINGS: Osseous: No fracture or mandibular dislocation. No destructive process. Degenerative changes of the cervical spine. Orbits: Negative. No traumatic or inflammatory finding. Sinuses: Clear. Soft tissues: Negative. No retropharyngeal fluid collection. No abnormality deep to the BB marker along the right temple. Limited intracranial: No significant or unexpected finding. IMPRESSION: 1. No acute abnormality. 2. No abnormality deep to the BB marker along the right temple. Electronically Signed: By: Titus Dubin M.D. On: 07/02/2017 10:40       Subjective: Patient feeling better, no nausea or vomiting, tolerating po well, no chest pain or dyspnea.   Discharge Exam: Vitals:   07/07/17 2215 07/08/17 0451  BP: 137/64 (!) 142/60  Pulse: 73 82  Resp: 15 16  Temp: 99.9 F (37.7 C) 97.7 F (36.5 C)  SpO2: 97% 96%   Vitals:   07/07/17 1506 07/07/17 2215 07/08/17 0243 07/08/17 0451  BP: (!) 144/64 137/64  (!) 142/60  Pulse: (!) 103 73  82  Resp: 17 15  16   Temp: 98.2 F (36.8 C) 99.9 F (37.7 C)  97.7 F (36.5 C)  TempSrc: Oral Oral  Oral  SpO2: 98% 97%  96%  Weight:   71.9 kg (158 lb 8.2 oz)   Height:        General: Not in pain or dyspnea.  Neurology: Awake and alert, non focal  E ENT: no pallor, no icterus, oral mucosa moist Cardiovascular: No JVD. S1-S2 present, rhythmic, no gallops, rubs, or murmurs. No lower extremity edema. Pulmonary: vesicular breath sounds bilaterally, adequate air movement, no wheezing, rhonchi or rales. Gastrointestinal. Abdomen flat, no organomegaly, non tender, no rebound or guarding Skin. No rashes Musculoskeletal: no joint  deformities   The results of significant diagnostics from this hospitalization (including imaging, microbiology, ancillary and laboratory) are listed below for reference.     Microbiology: Recent Results (from the past 240 hour(s))  Rapid strep screen     Status: None   Collection Time: 07/02/17 10:24 AM  Result Value Ref Range Status   Streptococcus, Group A Screen (Direct) NEGATIVE NEGATIVE Final    Comment: (NOTE) A Rapid Antigen test may result negative if the antigen level in the sample is below the detection level of this test. The FDA has not cleared this test as a stand-alone test therefore the rapid antigen negative result has reflexed to a Group A Strep culture. Performed at [facility], [place] [address] [place], [place] [address]   Culture, group A strep     Status: None   Collection Time: 07/02/17 10:24 AM  Result Value Ref Range Status   Specimen Description   Final    THROAT Performed at [place] [address] [place], [place] [address]    Special Requests   Final    NONE Reflexed from [id number] Performed at [facility], [place] [address], [place], [place] [address]    Culture   Final    NO GROUP A STREP (S.PYOGENES) ISOLATED Performed at [facility], [place] [address] [place], [place] [address]    Report Status 07/04/2017 FINAL  Final  Blood culture (routine x 2)     Status: None (Preliminary result)   Collection Time: 07/04/17 10:55 AM  Result Value Ref Range Status   Specimen Description BLOOD RIGHT ANTECUBITAL  Final   Special Requests   Final    BOTTLES DRAWN AEROBIC ONLY Blood Culture adequate volume   Culture   Final    NO GROWTH 3 DAYS Performed at [facility], [place] [address] [place], [place] [address]    Report Status PENDING  Incomplete  Blood culture (routine x 2)     Status: None (Preliminary result)   Collection Time: 07/04/17 11:07 AM  Result Value Ref Range Status    Specimen Description BLOOD LEFT ANTECUBITAL  Final   Special Requests   Final    BOTTLES DRAWN AEROBIC AND ANAEROBIC Blood Culture adequate volume   Culture   Final    NO GROWTH 3 DAYS Performed at [facility], [place] [address], [place], [place] [address]    Report Status PENDING  Incomplete  Aerobic/Anaerobic Culture (surgical/deep wound)     Status: None (Preliminary result)   Collection Time: 07/04/17  6:43 PM  Result Value Ref Range Status   Specimen Description ABSCESS  Final   Special Requests ANTERIOR MANDIBULAR  Final   Gram Stain   Final    FEW WBC PRESENT,BOTH PMN AND MONONUCLEAR FEW GRAM POSITIVE COCCI FEW GRAM NEGATIVE RODS Performed at [facility], [place] [address], [place],  [address]    Culture   Final  MODERATE EIKENELLA CORRODENS Usually susceptible to penicillin and other beta lactam agents,quinolones,macrolides and tetracyclines. ABUNDANT HAEMOPHILUS PARAINFLUENZAE BETA LACTAMASE NEGATIVE NO ANAEROBES ISOLATED; CULTURE IN PROGRESS FOR 5 DAYS    Report Status PENDING  Incomplete  Aerobic/Anaerobic Culture (surgical/deep wound)     Status: None (Preliminary result)   Collection Time: 07/04/17  6:49 PM  Result Value Ref Range Status   Specimen Description ABSCESS  Final   Special Requests ANTERIOR MANDIBULAR  Final   Gram Stain   Final    ABUNDANT WBC PRESENT,BOTH PMN AND MONONUCLEAR MODERATE GRAM POSITIVE COCCI FEW GRAM NEGATIVE RODS    Culture   Final    CULTURE REINCUBATED FOR BETTER GROWTH Performed at [facility], [place] [address], [place], [place] [address]    Report Status PENDING  Incomplete  MRSA PCR Screening     Status: None   Collection Time: 07/04/17  8:41 PM  Result Value Ref Range Status   MRSA by PCR NEGATIVE NEGATIVE Final    Comment:        The GeneXpert MRSA Assay (FDA approved for NASAL specimens only), is one component of a comprehensive MRSA colonization surveillance program. It is not intended to  diagnose MRSA infection nor to guide or monitor treatment for MRSA infections. Performed at [facility], [place] [address] [place],  [address]      Labs: BNP (last 3 results) No results for input(s): BNP in the last 8760 hours. Basic Metabolic Panel: Recent Labs  Lab 07/04/17 1037 07/04/17 1405 07/05/17 0402 07/06/17 0224 07/07/17 1025  NA 139 138 137 139 139  K 4.4 4.0 4.1 4.0 3.6  CL 103 103 105 104 103  CO2 25 23 22 24 22   GLUCOSE 117* 145* 171* 95 98  BUN 20 18 23* 20 17  CREATININE 0.78 0.73 0.84 0.66 0.68  CALCIUM 9.6 9.2 8.6* 8.9 9.2  MG  --  1.9 1.9 1.9  --   PHOS  --  2.3* 3.6  --   --    Liver Function Tests: Recent Labs  Lab 07/04/17 1037 07/04/17 1405  AST 18 21  ALT 19 21  ALKPHOS 75 88  BILITOT 0.7 0.8  PROT 7.4 7.9  ALBUMIN 3.7 3.8   No results for input(s): LIPASE, AMYLASE in the last 168 hours. No results for input(s): AMMONIA in the last 168 hours. CBC: Recent Labs  Lab 07/04/17 1037 07/04/17 1405 07/05/17 0402 07/06/17 0224 07/07/17 1025 07/08/17 0424  WBC 18.9* 23.3* 21.8* 21.7* 16.8* 14.2*  NEUTROABS 14.8* 22.0*  --   --  10.9* 9.3*  HGB 14.5 15.0 12.3 12.8 13.5 13.3  HCT 44.6 45.2 37.6 40.2 41.6 40.5  MCV 95.7 95.8 94.5 95.9 94.8 95.5  PLT 479* 508* 404* 448* 563* 526*   Cardiac Enzymes: No results for input(s): CKTOTAL, CKMB, CKMBINDEX, TROPONINI in the last 168 hours. BNP: Invalid input(s): POCBNP CBG: Recent Labs  Lab 07/07/17 2030 07/07/17 2353 07/08/17 0411 07/08/17 0818 07/08/17 1159  GLUCAP 107* 101* 89 103* 121*   D-Dimer No results for input(s): DDIMER in the last 72 hours. Hgb A1c No results for input(s): HGBA1C in the last 72 hours. Lipid Profile No results for input(s): CHOL, HDL, LDLCALC, TRIG, CHOLHDL, LDLDIRECT in the last 72 hours. Thyroid function studies No results for input(s): TSH, T4TOTAL, T3FREE, THYROIDAB in the last 72 hours.  Invalid input(s): FREET3 Anemia work up No  results for input(s): VITAMINB12, FOLATE, FERRITIN, TIBC, IRON, RETICCTPCT in the  last 72 hours. Urinalysis No results found for: COLORURINE, APPEARANCEUR, [place], [place], [place], [place], [place], [place], PROTEINUR, UROBILINOGEN, NITRITE, LEUKOCYTESUR Sepsis Labs Invalid input(s): PROCALCITONIN,  WBC,  LACTICIDVEN Microbiology Recent Results (from the past 240 hour(s))  Rapid strep screen     Status: None   Collection Time: 07/02/17 10:24 AM  Result Value Ref Range Status   Streptococcus, Group A Screen (Direct) NEGATIVE NEGATIVE Final    Comment: (NOTE) A Rapid Antigen test may result negative if the antigen level in the sample is below the detection level of this test. The FDA has not cleared this test as a stand-alone test therefore the rapid antigen negative result has reflexed to a Group A Strep culture. Performed at [facility], [place] [address] [place], [place] [address]   Culture, group A strep     Status: None   Collection Time: 07/02/17 10:24 AM  Result Value Ref Range Status   Specimen Description   Final    THROAT Performed at [place] [address] [place], [place] [address]    Special Requests   Final    NONE Reflexed from [id number] Performed at [facility], [place] [address], [place], [place] [address]    Culture   Final    NO GROUP A STREP (S.PYOGENES) ISOLATED Performed at [facility], [place] [address], [place], [place] [address]    Report Status 07/04/2017 FINAL  Final  Blood culture (routine x 2)     Status: None (Preliminary result)   Collection Time: 07/04/17 10:55 AM  Result Value Ref Range Status   Specimen Description BLOOD RIGHT ANTECUBITAL  Final   Special Requests   Final    BOTTLES DRAWN AEROBIC ONLY Blood Culture adequate volume   Culture   Final    NO GROWTH 3 DAYS Performed at [facility], [place] [address], [place], [place] [address]    Report Status PENDING   Incomplete  Blood culture (routine x 2)     Status: None (Preliminary result)   Collection Time: 07/04/17 11:07 AM  Result Value Ref Range Status   Specimen Description BLOOD LEFT ANTECUBITAL  Final   Special Requests   Final    BOTTLES DRAWN AEROBIC AND ANAEROBIC Blood Culture adequate volume   Culture   Final    NO GROWTH 3 DAYS Performed at [facility], [place] [address], [place], [place] [address]    Report Status PENDING  Incomplete  Aerobic/Anaerobic Culture (surgical/deep wound)     Status: None (Preliminary result)   Collection Time: 07/04/17  6:43 PM  Result Value Ref Range Status   Specimen Description ABSCESS  Final   Special Requests ANTERIOR MANDIBULAR  Final   Gram Stain   Final    FEW WBC PRESENT,BOTH PMN AND MONONUCLEAR FEW GRAM POSITIVE COCCI FEW GRAM NEGATIVE RODS Performed at [facility], [place] [address] [place], [place] [address]    Culture   Final    MODERATE EIKENELLA CORRODENS Usually susceptible to penicillin and other beta lactam agents,quinolones,macrolides and tetracyclines. ABUNDANT HAEMOPHILUS PARAINFLUENZAE BETA LACTAMASE NEGATIVE NO ANAEROBES ISOLATED; CULTURE IN PROGRESS FOR 5 DAYS    Report Status PENDING  Incomplete  Aerobic/Anaerobic Culture (surgical/deep wound)     Status: None (Preliminary result)   Collection Time: 07/04/17  6:49 PM  Result Value Ref Range Status   Specimen Description ABSCESS  Final   Special Requests ANTERIOR MANDIBULAR  Final   Gram Stain  Final    ABUNDANT WBC PRESENT,BOTH PMN AND MONONUCLEAR MODERATE GRAM POSITIVE COCCI FEW GRAM NEGATIVE RODS    Culture   Final    CULTURE REINCUBATED FOR BETTER GROWTH Performed at [facility], [place] [address], [place], [place] [address]    Report Status PENDING  Incomplete  MRSA PCR Screening     Status: None   Collection Time: 07/04/17  8:41 PM  Result Value Ref Range Status   MRSA by PCR NEGATIVE NEGATIVE Final    Comment:        The GeneXpert MRSA  Assay (FDA approved for NASAL specimens only), is one component of a comprehensive MRSA colonization surveillance program. It is not intended to diagnose MRSA infection nor to guide or monitor treatment for MRSA infections. Performed at [facility], [place] [address] [place], [place] [address]      Time coordinating discharge: 45 minutes  SIGNED:   Tawni Millers, MD  [facility] 07/08/2017, 12:13 PM Pager [phone]  If 7PM-7AM, please contact night-coverage [email or web address] Password [email or web address]

## 2017-07-08 NOTE — Evaluation (Signed)
Physical Therapy Evaluation Patient Details Name: Tina Hardy MRN: [id number] DOB: 17-Oct-1940 Today's Date: 07/08/2017   History of Present Illness  pt presented 2 wks after dental implant with pain. CT scan was obtained showing the implant to protrude through the back cortex of the mandible with early abscess formation.  Further imaging of soft tissue of the neck was obtained showing fluid collection within the tongue, as well as fluid dissecting posteriorly into the hypopharyngeal area consistent with Ludwig's angina. PMH: hepatic hemangioma, pernicious anemia  Clinical Impression  Patient evaluated by Physical Therapy with no further acute PT needs identified. All education has been completed and the patient has no further questions. Pt mod I with mobility. Fatigued after ambulation but appropriate for what she's been through and expect her energy level will return with return to her normal function. She showed mild memory deficits during eval, unsure if this is her baseline? Instructed her to ambulate in hallway 5x/ day until discharge. See below for any follow-up Physical Therapy or equipment needs. PT is signing off. Thank you for this referral.       Follow Up Recommendations No PT follow up    Equipment Recommendations  None recommended by PT    Recommendations for Other Services       Precautions / Restrictions Precautions Precautions: None Restrictions Weight Bearing Restrictions: No      Mobility  Bed Mobility Overal bed mobility: Independent                Transfers Overall transfer level: Independent Equipment used: None                Ambulation/Gait Ambulation/Gait assistance: Modified independent (Device/Increase time) Ambulation Distance (Feet): 300 Feet Assistive device: None Gait Pattern/deviations: WFL(Within Functional Limits) Gait velocity: WFL Gait velocity interpretation: at or above normal speed for age/gender General Gait  Details: pt began with forward head posture but was able to correct and maintain with vc's. No gait abnormalities noted. Pt fatigued with activity but appropriate for amount of time she has been in bed.   Stairs            Wheelchair Mobility    Modified Rankin (Stroke Patients Only)       Balance Overall balance assessment: Modified Independent                               Standardized Balance Assessment Standardized Balance Assessment : Berg Balance Test Berg Balance Test Sit to Stand: Able to stand without using hands and stabilize independently Standing Unsupported: Able to stand safely 2 minutes Sitting with Back Unsupported but Feet Supported on Floor or Stool: Able to sit safely and securely 2 minutes Stand to Sit: Sits safely with minimal use of hands Transfers: Able to transfer safely, minor use of hands Standing Unsupported with Eyes Closed: Able to stand 10 seconds safely Standing Ubsupported with Feet Together: Able to place feet together independently and stand 1 minute safely From Standing, Reach Forward with Outstretched Arm: Can reach confidently >25 cm (10") From Standing Position, Pick up Object from Floor: Able to pick up shoe safely and easily From Standing Position, Turn to Look Behind Over each Shoulder: Looks behind from both sides and weight shifts well Turn 360 Degrees: Able to turn 360 degrees safely in 4 seconds or less Standing Unsupported, Alternately Place Feet on Step/Stool: Able to stand independently and safely and complete 8 steps in 20 seconds  Standing Unsupported, One Foot in Front: Able to take small step independently and hold 30 seconds Standing on One Leg: Able to lift leg independently and hold 5-10 seconds Total Score: 53         Pertinent Vitals/Pain Pain Assessment: No/denies pain    Home Living Family/patient expects to be discharged to:: Private residence Living Arrangements: Children Available Help at  Discharge: Family;Available PRN/intermittently Type of Home: House Home Access: Stairs to enter   [employer] of Steps: 1 Home Layout: One level Home Equipment: Cane - single point Additional Comments: cane was her husband, he passed away last year    Prior Function Level of Independence: Independent         Comments: active at baseline, drives, goes to [employer]. Son and his fiancee live with her, they both work     Journalist, newspaper        Extremity/Trunk Assessment   Upper Extremity Assessment Upper Extremity Assessment: Overall WFL for tasks assessed    Lower Extremity Assessment Lower Extremity Assessment: Overall WFL for tasks assessed    Cervical / Trunk Assessment Cervical / Trunk Assessment: Normal  Communication   Communication: No difficulties  Cognition Arousal/Alertness: Awake/alert Behavior During Therapy: WFL for tasks assessed/performed Overall Cognitive Status: No family/caregiver present to determine baseline cognitive functioning                                 General Comments: pt repeats self several times throughout session, especially that her husband passed away. Also shows limited self awareness. Possible early signs of dementia? No family present.       General Comments General comments (skin integrity, edema, etc.): VSS throughout. discussed activity level upon return home to return to PLOF    Exercises     Assessment/Plan    PT Assessment Patent does not need any further PT services  PT Problem List         PT Treatment Interventions      PT Goals (Current goals can be found in the Care Plan section)  Acute Rehab PT Goals Patient Stated Goal: return home, go back to gym PT Goal Formulation: All assessment and education complete, DC therapy    Frequency     Barriers to discharge        Co-evaluation               AM-PAC PT "6 Clicks" Daily Activity  Outcome Measure Difficulty turning  over in bed (including adjusting bedclothes, sheets and blankets)?: None Difficulty moving from lying on back to sitting on the side of the bed? : None Difficulty sitting down on and standing up from a chair with arms (e.g., wheelchair, bedside commode, etc,.)?: None Help needed moving to and from a bed to chair (including a wheelchair)?: None Help needed walking in hospital room?: None Help needed climbing 3-5 steps with a railing? : None 6 Click Score: 24    End of Session   Activity Tolerance: Patient tolerated treatment well Patient left: in chair;with call bell/phone within reach Nurse Communication: Mobility status PT Visit Diagnosis: Muscle weakness (generalized) (M62.81)    Time: 1040-1109 PT Time Calculation (min) (ACUTE ONLY): 29 min   Charges:   PT Evaluation $PT Eval Moderate Complexity: 1 Mod PT Treatments $Gait Training: 8-22 mins   PT G Codes:        Leighton Roach, [place]  [phone]  [place] 07/08/2017, 12:25 PM

## 2017-07-08 NOTE — Progress Notes (Signed)
Nsg Discharge Note  Admit Date:  07/04/2017 Discharge date: 07/08/2017   Tina Hardy to be D/C'd Home per MD order.  AVS completed.  Copy for chart, and copy for patient signed, and dated. Patient/caregiver able to verbalize understanding.  Discharge Medication: Allergies as of 07/08/2017      Reactions   Codeine    REACTION: questionable   Codeine Other (See Comments)   Unknown reaction    Penicillins    REACTION: questionable   Penicillins Other (See Comments)   Unknown reaction  Has patient had a PCN reaction causing immediate rash, facial/tongue/throat swelling, SOB or lightheadedness with hypotension: n/a Has patient had a PCN reaction causing severe rash involving mucus membranes or skin necrosis: n/a Has patient had a PCN reaction that required hospitalization: n/a Has patient had a PCN reaction occurring within the last 10 years: n/a If all of the above answers are "NO", then may proceed with Cephalosporin use.      Medication List    STOP taking these medications   colesevelam 625 MG tablet Commonly known as:  WELCHOL   diphenoxylate-atropine 2.5-0.025 MG tablet Commonly known as:  LOMOTIL   hydrocortisone 25 MG suppository Commonly known as:  ANUSOL-HC   hyoscyamine 0.125 MG SL tablet Commonly known as:  LEVSIN SL   metroNIDAZOLE 500 MG tablet Commonly known as:  FLAGYL   predniSONE 10 MG (21) Tbpk tablet Commonly known as:  STERAPRED UNI-PAK 21 TAB     TAKE these medications   chlorhexidine 0.12 % solution Commonly known as:  PERIDEX 15 mLs by Mouth Rinse route 3 (three) times daily.   doxycycline 100 MG tablet Commonly known as:  VIBRA-TABS Take 1 tablet (100 mg total) by mouth every 12 (twelve) hours for 14 days.   ibuprofen 600 MG tablet Commonly known as:  ADVIL,MOTRIN Take 1 tablet (600 mg total) by mouth every 6 (six) hours as needed.   multivitamin tablet Take 2 tablets by mouth daily.   NON FORMULARY Paul bragg apple cider vin with  the mother   zolpidem 5 MG tablet Commonly known as:  AMBIEN Take 5 mg by mouth at bedtime.       Discharge Assessment: Vitals:   07/07/17 2215 07/08/17 0451  BP: 137/64 (!) 142/60  Pulse: 73 82  Resp: 15 16  Temp: 99.9 F (37.7 C) 97.7 F (36.5 C)  SpO2: 97% 96%   Skin incision on chin. IV catheter discontinued intact. Site without signs and symptoms of complications - no redness or edema noted at insertion site, patient denies c/o pain - only slight tenderness at site.  Dressing with slight pressure applied.  D/c Instructions-Education: Discharge instructions given to patient/family with verbalized understanding. D/c education completed with patient/family including follow up instructions, medication list, d/c activities limitations if indicated, with other d/c instructions as indicated by MD - patient able to verbalize understanding, all questions fully answered. Patient instructed to return to ED, call 911, or call MD for any changes in condition.  Patient escorted via [place], and D/C home via private auto.  Annalisse Minkoff Margaretha Sheffield, RN 07/08/2017 2:27 PM

## 2017-07-09 LAB — CULTURE, BLOOD (ROUTINE X 2)
Culture: NO GROWTH
Culture: NO GROWTH
SPECIAL REQUESTS: ADEQUATE
Special Requests: ADEQUATE

## 2017-07-09 LAB — AEROBIC/ANAEROBIC CULTURE W GRAM STAIN (SURGICAL/DEEP WOUND)

## 2017-07-09 LAB — AEROBIC/ANAEROBIC CULTURE (SURGICAL/DEEP WOUND)

## 2017-07-10 LAB — AEROBIC/ANAEROBIC CULTURE (SURGICAL/DEEP WOUND)

## 2017-07-10 LAB — AEROBIC/ANAEROBIC CULTURE W GRAM STAIN (SURGICAL/DEEP WOUND)

## 2017-07-11 NOTE — Consult Note (Signed)
            [facility] CM Primary Care Navigator  07/11/2017  Heaven Meeker Oct 14, 1940 [id number]   Attempt to seepatient at the bedsideto identify possible discharge needs but she was alreadydischarged home.   Per MD note, patient was admitted to the hospital with diagnosis of Ludwig's angina.  Patient has discharge instruction to follow-up with primary care provider in 1 week.  Primary care provider's office called(Dr. Reynaldo Minium- spoke to [place] confirmed if patient is still being seen at their office and was told that last visit was 04/2016. Patient was called on 07/09/17 by their office staff for transition of care follow-up and patient refused to set-up a follow-up appointment. PCP office is aware of patient's discharge and need for post hospital follow-up and transition of care (TOC) as well as aware of health issues needing follow-up. Rodena Piety states that office will continue to attempt to contact patient.  Made aware to refer patient to [facility] CM if deemed necessary and appropriate for services.   For additional questions please contact:  Edwena Felty A. Lulabelle Desta, BSN, RN-BC [facility] PRIMARY CARE Navigator Cell: [phone]

## 2017-07-23 DIAGNOSIS — R5383 Other fatigue: Secondary | ICD-10-CM | POA: Diagnosis not present

## 2017-07-23 DIAGNOSIS — R1033 Periumbilical pain: Secondary | ICD-10-CM | POA: Diagnosis not present

## 2017-07-23 DIAGNOSIS — N644 Mastodynia: Secondary | ICD-10-CM | POA: Diagnosis not present

## 2017-07-24 ENCOUNTER — Other Ambulatory Visit: Payer: Self-pay | Admitting: Obstetrics and Gynecology

## 2017-07-24 DIAGNOSIS — N644 Mastodynia: Secondary | ICD-10-CM

## 2017-07-30 ENCOUNTER — Ambulatory Visit
Admission: RE | Admit: 2017-07-30 | Discharge: 2017-07-30 | Disposition: A | Discharge status: Home / Self Care | Payer: Medicare Other | Source: Ambulatory Visit | Attending: Obstetrics and Gynecology | Admitting: Obstetrics and Gynecology

## 2017-07-30 ENCOUNTER — Ambulatory Visit: Payer: Medicare Other

## 2017-07-30 DIAGNOSIS — N644 Mastodynia: Secondary | ICD-10-CM

## 2017-07-31 DIAGNOSIS — I10 Essential (primary) hypertension: Secondary | ICD-10-CM | POA: Diagnosis not present

## 2017-07-31 DIAGNOSIS — Z6828 Body mass index (BMI) 28.0-28.9, adult: Secondary | ICD-10-CM | POA: Diagnosis not present

## 2017-07-31 DIAGNOSIS — D510 Vitamin B12 deficiency anemia due to intrinsic factor deficiency: Secondary | ICD-10-CM | POA: Diagnosis not present

## 2017-07-31 DIAGNOSIS — T8144XS Sepsis following a procedure, sequela: Secondary | ICD-10-CM | POA: Diagnosis not present

## 2017-08-07 DIAGNOSIS — M545 Low back pain: Secondary | ICD-10-CM | POA: Diagnosis not present

## 2017-08-07 DIAGNOSIS — E7849 Other hyperlipidemia: Secondary | ICD-10-CM | POA: Diagnosis not present

## 2017-08-07 DIAGNOSIS — Z86010 Personal history of colonic polyps: Secondary | ICD-10-CM | POA: Diagnosis not present

## 2017-08-07 DIAGNOSIS — D510 Vitamin B12 deficiency anemia due to intrinsic factor deficiency: Secondary | ICD-10-CM | POA: Diagnosis not present

## 2017-08-07 DIAGNOSIS — Z6828 Body mass index (BMI) 28.0-28.9, adult: Secondary | ICD-10-CM | POA: Diagnosis not present

## 2017-08-07 DIAGNOSIS — Z1389 Encounter for screening for other disorder: Secondary | ICD-10-CM | POA: Diagnosis not present

## 2017-08-07 DIAGNOSIS — G510 Bell's palsy: Secondary | ICD-10-CM | POA: Diagnosis not present

## 2017-08-07 DIAGNOSIS — J302 Other seasonal allergic rhinitis: Secondary | ICD-10-CM | POA: Diagnosis not present

## 2017-08-07 DIAGNOSIS — I10 Essential (primary) hypertension: Secondary | ICD-10-CM | POA: Diagnosis not present

## 2017-08-07 DIAGNOSIS — F418 Other specified anxiety disorders: Secondary | ICD-10-CM | POA: Diagnosis not present

## 2017-08-07 DIAGNOSIS — N6489 Other specified disorders of breast: Secondary | ICD-10-CM | POA: Diagnosis not present

## 2017-08-16 ENCOUNTER — Ambulatory Visit: Admission: RE | Admit: 2017-08-16 | Payer: Medicare Other | Source: Ambulatory Visit

## 2017-08-16 ENCOUNTER — Ambulatory Visit
Admission: RE | Admit: 2017-08-16 | Discharge: 2017-08-16 | Disposition: A | Discharge status: Home / Self Care | Payer: Medicare Other | Source: Ambulatory Visit | Attending: Obstetrics and Gynecology | Admitting: Obstetrics and Gynecology

## 2017-08-16 DIAGNOSIS — R928 Other abnormal and inconclusive findings on diagnostic imaging of breast: Secondary | ICD-10-CM | POA: Diagnosis not present

## 2017-08-23 ENCOUNTER — Ambulatory Visit: Payer: Medicare Other | Admitting: Physician Assistant

## 2017-08-23 ENCOUNTER — Other Ambulatory Visit (INDEPENDENT_AMBULATORY_CARE_PROVIDER_SITE_OTHER): Payer: Medicare Other

## 2017-08-23 ENCOUNTER — Encounter: Payer: Self-pay | Admitting: Physician Assistant

## 2017-08-23 VITALS — BP 168/84 | HR 88 | Ht 63.75 in | Wt 154.5 lb

## 2017-08-23 DIAGNOSIS — R5383 Other fatigue: Secondary | ICD-10-CM

## 2017-08-23 DIAGNOSIS — D1803 Hemangioma of intra-abdominal structures: Secondary | ICD-10-CM

## 2017-08-23 DIAGNOSIS — R1084 Generalized abdominal pain: Secondary | ICD-10-CM

## 2017-08-23 LAB — CBC WITH DIFFERENTIAL/PLATELET
BASOS ABS: 0.1 10*3/uL (ref 0.0–0.1)
BASOS PCT: 1 % (ref 0.0–3.0)
EOS ABS: 0.2 10*3/uL (ref 0.0–0.7)
Eosinophils Relative: 2.1 % (ref 0.0–5.0)
HEMATOCRIT: 40.6 % (ref 36.0–46.0)
HEMOGLOBIN: 13.6 g/dL (ref 12.0–15.0)
LYMPHS PCT: 30.6 % (ref 12.0–46.0)
Lymphs Abs: 2.6 10*3/uL (ref 0.7–4.0)
MCHC: 33.6 g/dL (ref 30.0–36.0)
MCV: 91.9 fl (ref 78.0–100.0)
MONO ABS: 0.7 10*3/uL (ref 0.1–1.0)
Monocytes Relative: 8.3 % (ref 3.0–12.0)
Neutro Abs: 4.9 10*3/uL (ref 1.4–7.7)
Neutrophils Relative %: 58 % (ref 43.0–77.0)
Platelets: 425 10*3/uL — ABNORMAL HIGH (ref 150.0–400.0)
RBC: 4.42 Mil/uL (ref 3.87–5.11)
RDW: 13.9 % (ref 11.5–15.5)
WBC: 8.4 10*3/uL (ref 4.0–10.5)

## 2017-08-23 LAB — BASIC METABOLIC PANEL
BUN: 13 mg/dL (ref 6–23)
CHLORIDE: 105 meq/L (ref 96–112)
CO2: 28 mEq/L (ref 19–32)
CREATININE: 0.68 mg/dL (ref 0.40–1.20)
Calcium: 9.3 mg/dL (ref 8.4–10.5)
GFR: 89.61 mL/min (ref >60.00)
Glucose, Bld: 96 mg/dL (ref 70–99)
Potassium: 4.1 mEq/L (ref 3.5–5.1)
Sodium: 139 mEq/L (ref 135–145)

## 2017-08-23 LAB — SEDIMENTATION RATE: SED RATE: 25 mm/h (ref 0–30)

## 2017-08-23 MED ORDER — PEG 3350-KCL-NA BICARB-NACL 420 G PO SOLR: 4000.0000 mL | Freq: Once | ORAL | 0 refills | Status: AC

## 2017-08-23 NOTE — Progress Notes (Signed)
Subjective:    Patient ID: Tina Hardy, female    DOB: 1941/02/16, 77 y.o.   MRN: [id number]  HPI Tina Hardy is a pleasant 77 year old white female, known previously to Dr. Delfin Edis, referred today by Dr. Reynaldo Minium for abdominal pain, and follow-up colonoscopy. Patient has history of left inguinal hernia repair, prior cholecystectomy, hypertension, hyperlipidemia, pernicious anemia.  She has history of hyperplastic polyp on colonoscopy and 2006.  She last had colonoscopy in February 2010 per Dr. Olevia Perches which was a normal exam. CT scan in 2014 showed a 3.7 x 4.6 cm hepatic hemangioma, a gastric fundal diverticulum, she status post cholecystectomy and had a few scattered rectosigmoid diverticuli. Patient states that she has been having "a stomachache" over the past couple of months.  She says she has had generalized discomfort in her abdomen which is been fairly constant and not well localized.,  she thinks  It is more in the right side of her abdomen.  She is not noted any changes in her bowel habits, no melena or hematochezia. She says sometimes she will feel better after evacuating her bowels or urinating.  Appetite has been okay, weight has been stable no nausea or vomiting.  No heartburn or indigestion.  Her only other complaint is fatigue. She had recently been evaluated by Dr. Meisinger/GYN and says he did not think her symptoms were gynecological he related. Labs in February 2019 with WBC of 16.8 and 14.8 on consecutive days, hemoglobin 13.3 hematocrit of 40.5, platelets 526 be met within normal limits.   Review of Systems Pertinent positive and negative review of systems were noted in the above HPI section.  All other review of systems was otherwise negative.  Outpatient Encounter Medications as of 08/23/2017  Medication Sig  . ibuprofen (ADVIL,MOTRIN) 600 MG tablet Take 1 tablet (600 mg total) by mouth every 6 (six) hours as needed.  . Multiple Vitamin (MULTIVITAMIN) tablet Take 2  tablets by mouth daily.   . NON FORMULARY as needed. Paul bragg apple cider vin with the mother   . zolpidem (AMBIEN) 5 MG tablet Take 5 mg by mouth at bedtime.  . polyethylene glycol-electrolytes (NULYTELY/GOLYTELY) 420 g solution Take 4,000 mLs by mouth once for 1 dose.  . [DISCONTINUED] chlorhexidine (PERIDEX) 0.12 % solution 15 mLs by Mouth Rinse route 3 (three) times daily.   No facility-administered encounter medications on file as of 08/23/2017.    Allergies  Allergen Reactions  . Codeine     REACTION: questionable  . Codeine Other (See Comments)    Unknown reaction   . Penicillins     REACTION: questionable  . Penicillins Other (See Comments)    Unknown reaction  Has patient had a PCN reaction causing immediate rash, facial/tongue/throat swelling, SOB or lightheadedness with hypotension: n/a Has patient had a PCN reaction causing severe rash involving mucus membranes or skin necrosis: n/a Has patient had a PCN reaction that required hospitalization: n/a Has patient had a PCN reaction occurring within the last 10 years: n/a If all of the above answers are "NO", then may proceed with Cephalosporin use.    Patient Active Problem List   Diagnosis Date Noted  . 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 Chronic abdominal pain with cramping/bloating 02/04/2013  . Stress at home 02/04/2013  . Rectal bleeding 01/05/2013  . Hepatic hemangioma 07/16/2012  . DIVERTICULOSIS OF COLON 06/22/2008  . HEMOCCULT POSITIVE STOOL 06/22/2008  . COLONIC POLYPS, HYPERPLASTIC, HX OF 06/22/2008   Social History   Socioeconomic History  .  Marital status: Single    Spouse name: Not on file  . Number of children: 1  . Years of education: Not on file  . Highest education level: Not on file  Occupational History  . Occupation: retired  Scientific laboratory technician  . Financial resource strain: Not on file  . Food insecurity:    Worry: Not on file    Inability: Not on file  . Transportation needs:    Medical: Not on  file    Non-medical: Not on file  Tobacco Use  . Smoking status: Former Smoker    Types: Cigarettes    Last attempt to quit: 12/04/1972    Years since quitting: 44.7  . Smokeless tobacco: Never Used  Substance and Sexual Activity  . Alcohol use: Yes    Comment: rarely  . Drug use: Yes    Types: Marijuana    Comment: "I will only tell you off the record"  . Sexual activity: Not on file  Lifestyle  . Physical activity:    Days per week: Not on file    Minutes per session: Not on file  . Stress: Not on file  Relationships  . Social connections:    Talks on phone: Not on file    Gets together: Not on file    Attends religious service: Not on file    Active member of club or organization: Not on file    Attends meetings of clubs or organizations: Not on file    Relationship status: Not on file  . Intimate partner violence:    Fear of current or ex partner: Not on file    Emotionally abused: Not on file    Physically abused: Not on file    Forced sexual activity: Not on file  Other Topics Concern  . Not on file  Social History Narrative   ** Merged History Encounter **        Ms. Styron family history includes Crohn's disease in her other and other; Heart failure in her mother.      Objective:    Vitals:   08/23/17 1035  BP: (!) 168/84  Pulse: 88    Physical Exam; well-developed older white female in no acute distress, pleasant blood pressure 168/84 pulse 88, height 5 foot 3, weight 154, BMI 26.7.  HEENT; nontraumatic normocephalic EOMI PERRLA sclera anicteric, Cardiovascular; regular rate and rhythm with S1-S2 no murmur rub or gallop, Pulmonary ;clear bilaterally, Abdomen; soft, bowel sounds are present she has rather generalized tenderness, no guarding or rebound no palpable mass or hepatosplenomegaly.  Rectal; exam not done, Extremities ;no clubbing cyanosis or edema skin warm and dry, Neuro psych; mood and affect appropriate       Assessment & Plan:   #24  77 year old white female with 2-46-monthhistory of abdominal pain, not well localized and associated fatigue and she has not had any weight loss. Etiology of her symptoms is not clear, will rule out occult malignancy #2 diverticulosis #3.  History of hepatic hemangioma #4.  We have hyperplastic colon polyp 2006, normal colonoscopy 2010 #5.  Post left inguinal hernia repair and cholecystectomy #6 hypertension #7.  Hyperlipidemia #8 recent persistent leukocytosis-question myelodysplastic disorder-repeat CBC today  Plan; CBC and C met today Scheduled for CT of the abdomen and pelvis with contrast Patient will also be scheduled for colonoscopy with Dr. JArdis Hughs  Procedure was discussed in detail with the patient including indications risks and benefits and she is agreeable to proceed. Further recommendations pending findings of  above.  Alfredia Ferguson PA-C 08/23/2017   Cc: Burnard Bunting, MD

## 2017-08-23 NOTE — Progress Notes (Signed)
I agree with the above note, plan 

## 2017-08-23 NOTE — Patient Instructions (Addendum)
Please go to the basement level to have your labs drawn.  You have been scheduled for a colonoscopy. Please follow written instructions given to you at your visit today.  Please pick up your prep supplies at the pharmacy within the next 1-3 days. If you use inhalers (even only as needed), please bring them with you on the day of your procedure.  You have been scheduled for a CT scan of the abdomen and pelvis at [facility] ([address]---this is in the same building as Press [facility]).   You are scheduled on Thursday at 08-29-2017. You should arrive at 8:15 am for an 8:30 am appointment.  Please follow the written instructions below on the day of your exam:  The Ct scan results will be reviewed. If we see something worrisome, The colonoscopy date can be moved to a sooner date   WARNING: IF YOU ARE ALLERGIC TO IODINE/X-RAY DYE, PLEASE NOTIFY RADIOLOGY IMMEDIATELY AT [phone]! YOU WILL BE GIVEN A 13 HOUR PREMEDICATION PREP.  1) Do not eat or drink anything after 4:30 am (4 hours prior to your test) 2) You have been given 2 bottles of oral contrast to drink. The solution may taste better if refrigerated, but do NOT add ice or any other liquid to this solution. Shake well before drinking.    Drink 1 bottle of contrast @ 6:30 am (2 hours prior to your exam)  Drink 1 bottle of contrast @ 7:30 am (1 hour prior to your exam)  You may take any medications as prescribed with a small amount of water except for the following: Metformin, Glucophage, Glucovance, Avandamet, Riomet, Fortamet, Actoplus Met, Janumet, Glumetza or Metaglip. The above medications must be held the day of the exam AND 48 hours after the exam.  The purpose of you drinking the oral contrast is to aid in the visualization of your intestinal tract. The contrast solution may cause some diarrhea. Before your exam is started, you will be given a small amount of fluid to drink. Depending on your individual set of  symptoms, you may also receive an intravenous injection of x-ray contrast/dye. Plan on being at [facility] for 30 minutes or long, depending on the type of exam you are having performed.  If you have any questions regarding your exam or if you need to reschedule, you may call the CT department at [phone] between the hours of 8:00 am and 5:00 pm, Monday-Friday.  If you are age 62 or older, your body mass index should be between 23-30. Your Body mass index is 26.73 kg/m. If this is out of the aforementioned range listed, please consider follow up with your Primary Care Provider.

## 2017-08-29 ENCOUNTER — Ambulatory Visit (INDEPENDENT_AMBULATORY_CARE_PROVIDER_SITE_OTHER)
Admission: RE | Admit: 2017-08-29 | Discharge: 2017-08-29 | Disposition: A | Discharge status: Home / Self Care | Payer: Medicare Other | Source: Ambulatory Visit | Attending: Physician Assistant | Admitting: Physician Assistant

## 2017-08-29 DIAGNOSIS — R1084 Generalized abdominal pain: Secondary | ICD-10-CM | POA: Diagnosis not present

## 2017-08-29 DIAGNOSIS — R109 Unspecified abdominal pain: Secondary | ICD-10-CM | POA: Diagnosis not present

## 2017-08-29 MED ORDER — IOPAMIDOL (ISOVUE-300) INJECTION 61%
100.0000 mL | Freq: Once | INTRAVENOUS | Status: AC | PRN
  Administered 2017-08-29: 100 mL via INTRAVENOUS

## 2017-08-30 ENCOUNTER — Telehealth: Payer: Self-pay

## 2017-08-30 ENCOUNTER — Other Ambulatory Visit: Payer: Self-pay

## 2017-08-30 DIAGNOSIS — K625 Hemorrhage of anus and rectum: Secondary | ICD-10-CM

## 2017-08-30 DIAGNOSIS — R1084 Generalized abdominal pain: Secondary | ICD-10-CM

## 2017-08-30 NOTE — Telephone Encounter (Signed)
The patient is aware of her results. Earlier appointment is available. She would like to move to the 09/03/17 appointment. She asks me to call her "guardian" Bebe Liter.  Call to Bebe Liter [phone]. Left a message to call back today.

## 2017-08-30 NOTE — Telephone Encounter (Signed)
-----   Message from Alfredia Ferguson, PA-C sent at 08/29/2017  1:16 PM EDT ----- Please let pt know the CT scan looks good - nothing worisome - she has a hemangioma in the right liver 4.3 cm which is stable in size, diverticulosis... Believe she is scheduled for Colonoscopy

## 2017-08-30 NOTE — Telephone Encounter (Signed)
Spoke with Tina Hardy. The patient called back and put Ms Tina Hardy on the phone. IInstructions reviewed. New procedure date confirmed 09/03/17 arrive 3:00 pm.

## 2017-09-03 ENCOUNTER — Encounter: Payer: Medicare Other | Admitting: Gastroenterology

## 2017-09-03 ENCOUNTER — Ambulatory Visit (AMBULATORY_SURGERY_CENTER): Payer: Medicare Other | Admitting: Gastroenterology

## 2017-09-03 ENCOUNTER — Encounter: Payer: Self-pay | Admitting: Gastroenterology

## 2017-09-03 VITALS — BP 130/72 | HR 70 | Temp 98.4°F | Resp 11 | Ht 63.0 in | Wt 154.0 lb

## 2017-09-03 DIAGNOSIS — R5383 Other fatigue: Secondary | ICD-10-CM | POA: Diagnosis not present

## 2017-09-03 DIAGNOSIS — K5730 Diverticulosis of large intestine without perforation or abscess without bleeding: Secondary | ICD-10-CM | POA: Diagnosis not present

## 2017-09-03 DIAGNOSIS — R1084 Generalized abdominal pain: Secondary | ICD-10-CM | POA: Diagnosis not present

## 2017-09-03 MED ORDER — SODIUM CHLORIDE 0.9 % IV SOLN: 500.0000 mL | Freq: Once | INTRAVENOUS | Status: DC

## 2017-09-03 NOTE — Patient Instructions (Signed)
HANDOUT GIVEN FOR DIVERTICULOSIS  YOU HAD AN ENDOSCOPIC PROCEDURE TODAY AT THE [facility]:   Refer to the procedure report that was given to you for any specific questions about what was found during the examination.  If the procedure report does not answer your questions, please call your gastroenterologist to clarify.  If you requested that your care partner not be given the details of your procedure findings, then the procedure report has been included in a sealed envelope for you to review at your convenience later.  YOU SHOULD EXPECT: Some feelings of bloating in the abdomen. Passage of more gas than usual.  Walking can help get rid of the air that was put into your GI tract during the procedure and reduce the bloating. If you had a lower endoscopy (such as a colonoscopy or flexible sigmoidoscopy) you may notice spotting of blood in your stool or on the toilet paper. If you underwent a bowel prep for your procedure, you may not have a normal bowel movement for a few days.  Please Note:  You might notice some irritation and congestion in your nose or some drainage.  This is from the oxygen used during your procedure.  There is no need for concern and it should clear up in a day or so.  SYMPTOMS TO REPORT IMMEDIATELY:   Following lower endoscopy (colonoscopy or flexible sigmoidoscopy):  Excessive amounts of blood in the stool  Significant tenderness or worsening of abdominal pains  Swelling of the abdomen that is new, acute  Fever of 100F or higher   For urgent or emergent issues, a gastroenterologist can be reached at any hour by calling [phone].   DIET:  We do recommend a small meal at first, but then you may proceed to your regular diet.  Drink plenty of fluids but you should avoid alcoholic beverages for 24 hours.  ACTIVITY:  You should plan to take it easy for the rest of today and you should NOT DRIVE or use heavy machinery until tomorrow (because of the sedation  medicines used during the test).    FOLLOW UP: Our staff will call the number listed on your records the next business day following your procedure to check on you and address any questions or concerns that you may have regarding the information given to you following your procedure. If we do not reach you, we will leave a message.  However, if you are feeling well and you are not experiencing any problems, there is no need to return our call.  We will assume that you have returned to your regular daily activities without incident.  If any biopsies were taken you will be contacted by phone or by letter within the next 1-3 weeks.  Please call us at [phone] if you have not heard about the biopsies in 3 weeks.    SIGNATURES/CONFIDENTIALITY: You and/or your care partner have signed paperwork which will be entered into your electronic medical record.  These signatures attest to the fact that that the information above on your After Visit Summary has been reviewed and is understood.  Full responsibility of the confidentiality of this discharge information lies with you and/or your care-partner. 

## 2017-09-03 NOTE — Op Note (Signed)
[place] Patient Name: Tina Hardy Procedure Date: 09/03/2017 10:05 AM MRN: [id number] Endoscopist: Milus Banister , MD Age: 77 Referring MD:  Date of Birth: 08-Jul-1940 Gender: Female Account #: [id number] Procedure:                Colonoscopy Indications:              Generalized abdominal pain; no weight loss, normal                            cbc/cmet, no overt bleeding; CT scan abd/pelvis                            without clear explanation. Medicines:                Monitored Anesthesia Care Procedure:                Pre-Anesthesia Assessment:                           - Prior to the procedure, a History and Physical                            was performed, and patient medications and                            allergies were reviewed. The patient's tolerance of                            previous anesthesia was also reviewed. The risks                            and benefits of the procedure and the sedation                            options and risks were discussed with the patient.                            All questions were answered, and informed consent                            was obtained. Prior Anticoagulants: The patient has                            taken no previous anticoagulant or antiplatelet                            agents. ASA Grade Assessment: II - A patient with                            mild systemic disease. After reviewing the risks                            and benefits, the patient was deemed in  satisfactory condition to undergo the procedure.                           After obtaining informed consent, the colonoscope                            was passed under direct vision. Throughout the                            procedure, the patient's blood pressure, pulse, and                            oxygen saturations were monitored continuously. The                            Colonoscope was introduced  through the anus and                            advanced to the the terminal ileum. The colonoscopy                            was performed without difficulty. The patient                            tolerated the procedure well. The quality of the                            bowel preparation was good. The terminal ileum,                            ileocecal valve, appendiceal orifice, and rectum                            were photographed. Scope In: 10:24:00 AM Scope Out: 10:39:31 AM Scope Withdrawal Time: 0 hours 10 minutes 59 seconds  Total Procedure Duration: 0 hours 15 minutes 31 seconds  Findings:                 The terminal ileum appeared normal.                           Multiple small and large-mouthed diverticula were                            found in the entire colon.                           The exam was otherwise without abnormality on                            direct and retroflexion views. Complications:            No immediate complications. Estimated blood loss:                            None. Estimated Blood Loss:  Estimated blood loss: none. Impression:               - The examined portion of the ileum was normal.                           - Diverticulosis in the entire examined colon.                           - The examination was otherwise normal on direct                            and retroflexion views.                           - No polyps or cancers. Recommendation:           - Patient has a contact number available for                            emergencies. The signs and symptoms of potential                            delayed complications were discussed with the                            patient. Return to normal activities tomorrow.                            Written discharge instructions were provided to the                            patient.                           - Resume previous diet.                           - Continue present  medications.                           - You do not need any further colon cancer                            screening tests (including stool testing). These                            types of tests generally stop around age 57-80. Milus Banister, MD 09/03/2017 10:44:29 AM This report has been signed electronically.

## 2017-09-03 NOTE — Progress Notes (Signed)
Spontaneous respirations throughout. VSS. Resting comfortably. To PACU on room air. Report to  RN. 

## 2017-09-03 NOTE — Progress Notes (Signed)
Pt's states no medical or surgical changes since previsit or office visit. 

## 2017-09-04 ENCOUNTER — Telehealth: Payer: Self-pay

## 2017-09-04 NOTE — Telephone Encounter (Signed)
  Follow up Call-  Call back number 09/03/2017  Post procedure Call Back phone  # [phone]  Permission to leave phone message Yes  Some recent data might be hidden     Patient questions:  Do you have a fever, pain , or abdominal swelling? No. Pain Score  0 *  Have you tolerated food without any problems? Yes.    Have you been able to return to your normal activities? Yes.    Do you have any questions about your discharge instructions: Diet   No. Medications  No. Follow up visit  No.  Do you have questions or concerns about your Care? No.  Actions: * If pain score is 4 or above: No action needed, pain <4.  No problems noted per pt. maw

## 2017-09-25 ENCOUNTER — Ambulatory Visit (INDEPENDENT_AMBULATORY_CARE_PROVIDER_SITE_OTHER): Payer: Medicare Other | Admitting: Emergency Medicine

## 2017-09-25 ENCOUNTER — Encounter: Payer: Self-pay | Admitting: Emergency Medicine

## 2017-09-25 ENCOUNTER — Other Ambulatory Visit: Payer: Self-pay

## 2017-09-25 VITALS — BP 150/66 | HR 77 | Temp 97.9°F | Resp 16 | Ht 63.25 in | Wt 152.6 lb

## 2017-09-25 DIAGNOSIS — R21 Rash and other nonspecific skin eruption: Secondary | ICD-10-CM | POA: Diagnosis not present

## 2017-09-25 DIAGNOSIS — L089 Local infection of the skin and subcutaneous tissue, unspecified: Secondary | ICD-10-CM

## 2017-09-25 MED ORDER — PREDNISONE 20 MG PO TABS: 40.0000 mg | ORAL_TABLET | Freq: Every day | ORAL | 0 refills | Status: AC

## 2017-09-25 MED ORDER — AZITHROMYCIN 250 MG PO TABS: ORAL_TABLET | ORAL | 0 refills | Status: DC

## 2017-09-25 NOTE — Progress Notes (Signed)
Tina Hardy 77 y.o.   Chief Complaint  Patient presents with  . Rash    on neck and body with itching x 2 weeks    HISTORY OF PRESENT ILLNESS: This is a 77 y.o. female complaining of itchy rash on neck and body on and off for the past 2 weeks.  No other significant symptoms  HPI   Prior to Admission medications   Medication Sig Start Date End Date Taking? Authorizing Provider  ibuprofen (ADVIL,MOTRIN) 600 MG tablet Take 1 tablet (600 mg total) by mouth every 6 (six) hours as needed. 07/02/17  Yes Isla Pence, MD  Multiple Vitamin (MULTIVITAMIN) tablet Take 2 tablets by mouth daily.    Yes [provider]  zolpidem (AMBIEN) 5 MG tablet Take 5 mg by mouth at bedtime. 06/06/17  Yes [provider]  NON FORMULARY as needed. Paul bragg apple cider vin with the mother     [provider]    Allergies  Allergen Reactions  . Codeine     REACTION: questionable  . Codeine Other (See Comments)    Unknown reaction   . Penicillins     REACTION: questionable  . Penicillins Other (See Comments)    Unknown reaction  Has patient had a PCN reaction causing immediate rash, facial/tongue/throat swelling, SOB or lightheadedness with hypotension: n/a Has patient had a PCN reaction causing severe rash involving mucus membranes or skin necrosis: n/a Has patient had a PCN reaction that required hospitalization: n/a Has patient had a PCN reaction occurring within the last 10 years: n/a If all of the above answers are "NO", then may proceed with Cephalosporin use.     Patient Active Problem List   Diagnosis Date Noted  . 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 Chronic abdominal pain with cramping/bloating 02/04/2013  . Stress at home 02/04/2013  . Rectal bleeding 01/05/2013  . Hepatic hemangioma 07/16/2012  . Diverticulosis of colon 06/22/2008  . HEMOCCULT POSITIVE STOOL 06/22/2008  . COLONIC POLYPS, HYPERPLASTIC, HX OF 06/22/2008    Past Medical History:    Diagnosis Date  . Colon polyp 07/18/2004   Hyperplastic  . Diverticulosis   . Hepatic hemangioma 07/16/2012  . Liver hemangioma   . Pernicious anemia   . Rectal bleed     Past Surgical History:  Procedure Laterality Date  . APPENDECTOMY  2012   Dr Zella Richer  . APPENDECTOMY    . CHOLECYSTECTOMY  05/30/2012   Procedure: LAPAROSCOPIC CHOLECYSTECTOMY WITH INTRAOPERATIVE CHOLANGIOGRAM;  Surgeon: Gayland Curry, MD,FACS;  Location: [facility];  Service: General;  Laterality: N/A;  . DENTAL SURGERY    . HERNIA REPAIR    . INGUINAL HERNIA REPAIR  12/24/06   left; laparoscopic  . KNEE SURGERY     right  . MANDIBULAR HARDWARE REMOVAL N/A 07/04/2017   Procedure: REMOVAL OF IMPLANT MANDIBLE;  Surgeon: Michael Litter, DMD;  Location: [facility];  Service: Oral Surgery;  Laterality: N/A;  . UMBILICAL HERNIA REPAIR  12/24/06   with reduction of sigmoid colon which was incarcerated    Social History   Socioeconomic History  . Marital status: Single    Spouse name: Not on file  . Number of children: 1  . Years of education: Not on file  . Highest education level: Not on file  Occupational History  . Occupation: retired  Scientific laboratory technician  . Financial resource strain: Not on file  . Food insecurity:    Worry: Not on file    Inability: Not on file  .  Transportation needs:    Medical: Not on file    Non-medical: Not on file  Tobacco Use  . Smoking status: Former Smoker    Types: Cigarettes    Last attempt to quit: 12/04/1972    Years since quitting: 44.8  . Smokeless tobacco: Never Used  Substance and Sexual Activity  . Alcohol use: Yes    Comment: rarely  . Drug use: Yes    Types: Marijuana    Comment: "I will only tell you off the record"  . Sexual activity: Not on file  Lifestyle  . Physical activity:    Days per week: Not on file    Minutes per session: Not on file  . Stress: Not on file  Relationships  . Social connections:    Talks on phone: Not on file    Gets together: Not on file     Attends religious service: Not on file    Active member of club or organization: Not on file    Attends meetings of clubs or organizations: Not on file    Relationship status: Not on file  . Intimate partner violence:    Fear of current or ex partner: Not on file    Emotionally abused: Not on file    Physically abused: Not on file    Forced sexual activity: Not on file  Other Topics Concern  . Not on file  Social History Narrative   ** Merged History Encounter **        Family History  Problem Relation Age of Onset  . Heart failure Mother   . Crohn's disease Other        neice  . Crohn's disease Other        nephew  . Colon cancer Neg Hx   . Liver cancer Neg Hx      Review of Systems  Constitutional: Negative.  Negative for chills and fever.  HENT: Negative.  Negative for sore throat.   Eyes: Negative.  Negative for discharge and redness.  Respiratory: Negative for cough and shortness of breath.   Cardiovascular: Negative.  Negative for chest pain and palpitations.  Gastrointestinal: Negative for abdominal pain, diarrhea, nausea and vomiting.  Genitourinary: Negative.  Negative for dysuria and hematuria.  Musculoskeletal: Negative.   Skin: Positive for itching and rash.  Neurological: Negative.  Negative for dizziness and headaches.  Endo/Heme/Allergies: Negative.   All other systems reviewed and are negative.   Vitals:   09/25/17 1207  BP: (!) 150/66  Pulse: 77  Resp: 16  Temp: 97.9 F (36.6 C)  SpO2: 97%    Physical Exam  Constitutional: She is oriented to person, place, and time. She appears well-developed and well-nourished.  HENT:  Head: Normocephalic and atraumatic.  Nose: Nose normal.  Mouth/Throat: Oropharynx is clear and moist.  Eyes: Pupils are equal, round, and reactive to light. Conjunctivae and EOM are normal.  Neck: Normal range of motion. Neck supple.  Cardiovascular: Normal rate, regular rhythm and normal heart sounds.  Pulmonary/Chest:  Effort normal and breath sounds normal.  Abdominal: Soft. She exhibits no distension. There is no tenderness.  Musculoskeletal: Normal range of motion.  Neurological: She is alert and oriented to person, place, and time. No sensory deficit. She exhibits normal muscle tone.  Skin: Skin is warm and dry. Capillary refill takes less than 2 seconds. Rash (Maculopapular rash to neck and upper chest and upper back areas) noted.  Psychiatric: She has a normal mood and affect. Her behavior is  normal.  Vitals reviewed.    ASSESSMENT & PLAN: Howard was seen today for rash.  Diagnoses and all orders for this visit:  Rash and nonspecific skin eruption -     predniSONE (DELTASONE) 20 MG tablet; Take 2 tablets (40 mg total) by mouth daily with breakfast for 5 days. -     azithromycin (ZITHROMAX) 250 MG tablet; Sig as indicated  Skin infection    Patient Instructions       IF you received an x-ray today, you will receive an invoice from [facility]. Please contact [facility] at [phone] with questions or concerns regarding your invoice.   IF you received labwork today, you will receive an invoice from [facility]. Please contact [facility] at [phone] with questions or concerns regarding your invoice.   Our billing staff will not be able to assist you with questions regarding bills from these companies.  You will be contacted with the lab results as soon as they are available. The fastest way to get your results is to activate your My Chart account. Instructions are located on the last page of this paperwork. If you have not heard from [place] regarding the results in 2 weeks, please contact this office.     Rash A rash is a change in the color of the skin. A rash can also change the way your skin feels. There are many different conditions and factors that can cause a rash. Follow these instructions at home: Pay attention to any changes in your symptoms. Follow these  instructions to help with your condition: Medicine Take or apply over-the-counter and prescription medicines only as told by your doctor. These may include:  Corticosteroid cream.  Anti-itch lotions.  Oral antihistamines.  Skin Care  Put cool compresses on the affected areas.  Try taking a bath with: ? Epsom salts. Follow the instructions on the packaging. You can get these at your local pharmacy or grocery store. ? Baking soda. Pour a small amount into the bath as told by your doctor. ? Colloidal oatmeal. Follow the instructions on the packaging. You can get this at your local pharmacy or grocery store.  Try putting baking soda paste onto your skin. Stir water into baking soda until it gets like a paste.  Do not scratch or rub your skin.  Avoid covering the rash. Make sure the rash is exposed to air as much as possible. General instructions  Avoid hot showers or baths, which can make itching worse. A cold shower may help.  Avoid scented soaps, detergents, and perfumes. Use gentle soaps, detergents, perfumes, and other cosmetic products.  Avoid anything that causes your rash. Keep a journal to help track what causes your rash. Write down: ? What you eat. ? What cosmetic products you use. ? What you drink. ? What you wear. This includes jewelry.  Keep all follow-up visits as told by your doctor. This is important. Contact a doctor if:  You sweat at night.  You lose weight.  You pee (urinate) more than normal.  You feel weak.  You throw up (vomit).  Your skin or the whites of your eyes look yellow (jaundice).  Your skin: ? Tingles. ? Is numb.  Your rash: ? Does not go away after a few days. ? Gets worse.  You are: ? More thirsty than normal. ? More tired than normal.  You have: ? New symptoms. ? Pain in your belly (abdomen). ? A fever. ? Watery poop (diarrhea). Get help right away if:  Your rash covers all or most of your body. The rash may or may  not be painful.  You have blisters that: ? Are on top of the rash. ? Grow larger. ? Grow together. ? Are painful. ? Are inside your nose or mouth.  You have a rash that: ? Looks like purple pinprick-sized spots all over your body. ? Has a "bull's eye" or looks like a target. ? Is red and painful, causes your skin to peel, and is not from being in the sun too long. This information is not intended to replace advice given to you by your health care provider. Make sure you discuss any questions you have with your health care provider. Document Released: 10/24/2007 Document Revised: 10/13/2015 Document Reviewed: 09/22/2014 Elsevier Interactive Patient Education  2018 Elsevier Inc.      Agustina Caroli, MD [facility]

## 2017-09-25 NOTE — Patient Instructions (Addendum)
   IF you received an x-ray today, you will receive an invoice from [facility]. Please contact  Radiology at [phone] with questions or concerns regarding your invoice.   IF you received labwork today, you will receive an invoice from [facility]. Please contact [facility] at [phone] with questions or concerns regarding your invoice.   Our billing staff will not be able to assist you with questions regarding bills from these companies.  You will be contacted with the lab results as soon as they are available. The fastest way to get your results is to activate your My Chart account. Instructions are located on the last page of this paperwork. If you have not heard from us regarding the results in 2 weeks, please contact this office.     Rash A rash is a change in the color of the skin. A rash can also change the way your skin feels. There are many different conditions and factors that can cause a rash. Follow these instructions at home: Pay attention to any changes in your symptoms. Follow these instructions to help with your condition: Medicine Take or apply over-the-counter and prescription medicines only as told by your doctor. These may include:  Corticosteroid cream.  Anti-itch lotions.  Oral antihistamines.  Skin Care  Put cool compresses on the affected areas.  Try taking a bath with: ? Epsom salts. Follow the instructions on the packaging. You can get these at your local pharmacy or grocery store. ? Baking soda. Pour a small amount into the bath as told by your doctor. ? Colloidal oatmeal. Follow the instructions on the packaging. You can get this at your local pharmacy or grocery store.  Try putting baking soda paste onto your skin. Stir water into baking soda until it gets like a paste.  Do not scratch or rub your skin.  Avoid covering the rash. Make sure the rash is exposed to air as much as possible. General instructions  Avoid hot  showers or baths, which can make itching worse. A cold shower may help.  Avoid scented soaps, detergents, and perfumes. Use gentle soaps, detergents, perfumes, and other cosmetic products.  Avoid anything that causes your rash. Keep a journal to help track what causes your rash. Write down: ? What you eat. ? What cosmetic products you use. ? What you drink. ? What you wear. This includes jewelry.  Keep all follow-up visits as told by your doctor. This is important. Contact a doctor if:  You sweat at night.  You lose weight.  You pee (urinate) more than normal.  You feel weak.  You throw up (vomit).  Your skin or the whites of your eyes look yellow (jaundice).  Your skin: ? Tingles. ? Is numb.  Your rash: ? Does not go away after a few days. ? Gets worse.  You are: ? More thirsty than normal. ? More tired than normal.  You have: ? New symptoms. ? Pain in your belly (abdomen). ? A fever. ? Watery poop (diarrhea). Get help right away if:  Your rash covers all or most of your body. The rash may or may not be painful.  You have blisters that: ? Are on top of the rash. ? Grow larger. ? Grow together. ? Are painful. ? Are inside your nose or mouth.  You have a rash that: ? Looks like purple pinprick-sized spots all over your body. ? Has a "bull's eye" or looks like a target. ? Is red and painful, causes   your skin to peel, and is not from being in the sun too long. This information is not intended to replace advice given to you by your health care provider. Make sure you discuss any questions you have with your health care provider. Document Released: 10/24/2007 Document Revised: 10/13/2015 Document Reviewed: 09/22/2014 Elsevier Interactive Patient Education  2018 Elsevier Inc.  

## 2017-10-11 ENCOUNTER — Encounter: Payer: Medicare Other | Admitting: Gastroenterology

## 2017-11-06 DIAGNOSIS — Z6827 Body mass index (BMI) 27.0-27.9, adult: Secondary | ICD-10-CM | POA: Diagnosis not present

## 2017-11-06 DIAGNOSIS — I10 Essential (primary) hypertension: Secondary | ICD-10-CM | POA: Diagnosis not present

## 2017-11-06 DIAGNOSIS — K5909 Other constipation: Secondary | ICD-10-CM | POA: Diagnosis not present

## 2017-11-06 DIAGNOSIS — F418 Other specified anxiety disorders: Secondary | ICD-10-CM | POA: Diagnosis not present

## 2018-03-24 DIAGNOSIS — L309 Dermatitis, unspecified: Secondary | ICD-10-CM | POA: Diagnosis not present

## 2018-04-14 DIAGNOSIS — R82998 Other abnormal findings in urine: Secondary | ICD-10-CM | POA: Diagnosis not present

## 2018-04-14 DIAGNOSIS — E7849 Other hyperlipidemia: Secondary | ICD-10-CM | POA: Diagnosis not present

## 2018-04-14 DIAGNOSIS — D510 Vitamin B12 deficiency anemia due to intrinsic factor deficiency: Secondary | ICD-10-CM | POA: Diagnosis not present

## 2018-04-14 DIAGNOSIS — I10 Essential (primary) hypertension: Secondary | ICD-10-CM | POA: Diagnosis not present

## 2018-04-15 DIAGNOSIS — Z1212 Encounter for screening for malignant neoplasm of rectum: Secondary | ICD-10-CM | POA: Diagnosis not present

## 2019-02-16 ENCOUNTER — Other Ambulatory Visit: Payer: Self-pay

## 2019-02-16 ENCOUNTER — Observation Stay
Admission: EM | Admit: 2019-02-16 | Discharge: 2019-02-17 | Disposition: A | Discharge status: Home / Self Care | Payer: Medicare Other | Attending: Internal Medicine | Admitting: Internal Medicine

## 2019-02-16 ENCOUNTER — Observation Stay: Payer: Medicare Other

## 2019-02-16 ENCOUNTER — Emergency Department: Payer: Medicare Other

## 2019-02-16 ENCOUNTER — Encounter: Payer: Self-pay | Admitting: Emergency Medicine

## 2019-02-16 DIAGNOSIS — Z8673 Personal history of transient ischemic attack (TIA), and cerebral infarction without residual deficits: Secondary | ICD-10-CM | POA: Insufficient documentation

## 2019-02-16 DIAGNOSIS — Z79899 Other long term (current) drug therapy: Secondary | ICD-10-CM | POA: Insufficient documentation

## 2019-02-16 DIAGNOSIS — G459 Transient cerebral ischemic attack, unspecified: Secondary | ICD-10-CM | POA: Diagnosis not present

## 2019-02-16 DIAGNOSIS — Z87891 Personal history of nicotine dependence: Secondary | ICD-10-CM | POA: Insufficient documentation

## 2019-02-16 DIAGNOSIS — Z885 Allergy status to narcotic agent status: Secondary | ICD-10-CM | POA: Insufficient documentation

## 2019-02-16 DIAGNOSIS — Z20828 Contact with and (suspected) exposure to other viral communicable diseases: Secondary | ICD-10-CM | POA: Diagnosis not present

## 2019-02-16 DIAGNOSIS — M6281 Muscle weakness (generalized): Secondary | ICD-10-CM | POA: Diagnosis not present

## 2019-02-16 DIAGNOSIS — F1290 Cannabis use, unspecified, uncomplicated: Secondary | ICD-10-CM | POA: Diagnosis not present

## 2019-02-16 DIAGNOSIS — Z880 Allergy status to penicillin: Secondary | ICD-10-CM | POA: Diagnosis not present

## 2019-02-16 DIAGNOSIS — I10 Essential (primary) hypertension: Secondary | ICD-10-CM | POA: Diagnosis not present

## 2019-02-16 LAB — CBC
HCT: 43.7 % (ref 36.0–46.0)
Hemoglobin: 14.3 g/dL (ref 12.0–15.0)
MCH: 30.2 pg (ref 26.0–34.0)
MCHC: 32.7 g/dL (ref 30.0–36.0)
MCV: 92.2 fL (ref 80.0–100.0)
Platelets: 394 10*3/uL (ref 150–400)
RBC: 4.74 MIL/uL (ref 3.87–5.11)
RDW: 12.7 % (ref 11.5–15.5)
WBC: 9.3 10*3/uL (ref 4.0–10.5)
nRBC: 0 % (ref 0.0–0.2)

## 2019-02-16 LAB — COMPREHENSIVE METABOLIC PANEL
ALT: 21 U/L (ref 0–44)
AST: 23 U/L (ref 15–41)
Albumin: 4 g/dL (ref 3.5–5.0)
Alkaline Phosphatase: 68 U/L (ref 38–126)
Anion gap: 9 (ref 5–15)
BUN: 16 mg/dL (ref 8–23)
CO2: 26 mmol/L (ref 22–32)
Calcium: 9.6 mg/dL (ref 8.9–10.3)
Chloride: 105 mmol/L (ref 98–111)
Creatinine, Ser: 0.71 mg/dL (ref 0.44–1.00)
GFR calc Af Amer: >60 mL/min (ref >60)
GFR calc non Af Amer: >60 mL/min (ref >60)
Glucose, Bld: 101 mg/dL — ABNORMAL HIGH (ref 70–99)
Potassium: 4 mmol/L (ref 3.5–5.1)
Sodium: 140 mmol/L (ref 135–145)
Total Bilirubin: 0.8 mg/dL (ref 0.3–1.2)
Total Protein: 6.9 g/dL (ref 6.5–8.1)

## 2019-02-16 LAB — DIFFERENTIAL
Abs Immature Granulocytes: 0.04 10*3/uL (ref 0.00–0.07)
Basophils Absolute: 0.1 10*3/uL (ref 0.0–0.1)
Basophils Relative: 1 %
Eosinophils Absolute: 0.2 10*3/uL (ref 0.0–0.5)
Eosinophils Relative: 2 %
Immature Granulocytes: 0 %
Lymphocytes Relative: 28 %
Lymphs Abs: 2.6 10*3/uL (ref 0.7–4.0)
Monocytes Absolute: 0.7 10*3/uL (ref 0.1–1.0)
Monocytes Relative: 8 %
Neutro Abs: 5.7 10*3/uL (ref 1.7–7.7)
Neutrophils Relative %: 61 %

## 2019-02-16 LAB — LIPID PANEL
Cholesterol: 228 mg/dL — ABNORMAL HIGH (ref 0–200)
HDL: 44 mg/dL (ref >40)
LDL Cholesterol: 132 mg/dL — ABNORMAL HIGH (ref 0–99)
Total CHOL/HDL Ratio: 5.2 RATIO
Triglycerides: 260 mg/dL — ABNORMAL HIGH (ref <150)
VLDL: 52 mg/dL — ABNORMAL HIGH (ref 0–40)

## 2019-02-16 LAB — GLUCOSE, CAPILLARY: Glucose-Capillary: 98 mg/dL (ref 70–99)

## 2019-02-16 LAB — PROTIME-INR
INR: 1 (ref 0.8–1.2)
Prothrombin Time: 12.7 seconds (ref 11.4–15.2)

## 2019-02-16 LAB — APTT: aPTT: 28 seconds (ref 24–36)

## 2019-02-16 MED ORDER — ACETAMINOPHEN 325 MG PO TABS: 650.0000 mg | ORAL_TABLET | ORAL | Status: DC | PRN

## 2019-02-16 MED ORDER — ASPIRIN 81 MG PO CHEW
324.0000 mg | CHEWABLE_TABLET | Freq: Once | ORAL | Status: AC
  Administered 2019-02-16: 324 mg via ORAL
  Filled 2019-02-16: qty 4

## 2019-02-16 MED ORDER — ENOXAPARIN SODIUM 40 MG/0.4ML ~~LOC~~ SOLN
40.0000 mg | SUBCUTANEOUS | Status: DC
  Administered 2019-02-16: 22:00:00 40 mg via SUBCUTANEOUS
  Filled 2019-02-16: qty 0.4

## 2019-02-16 MED ORDER — SENNOSIDES-DOCUSATE SODIUM 8.6-50 MG PO TABS: 1.0000 | ORAL_TABLET | Freq: Every evening | ORAL | Status: DC | PRN

## 2019-02-16 MED ORDER — AMLODIPINE BESYLATE 5 MG PO TABS
5.0000 mg | ORAL_TABLET | Freq: Every day | ORAL | Status: DC
  Administered 2019-02-16 – 2019-02-17 (×2): 5 mg via ORAL
  Filled 2019-02-16 (×2): qty 1

## 2019-02-16 MED ORDER — SODIUM CHLORIDE 0.9 % IV SOLN
INTRAVENOUS | Status: DC
  Administered 2019-02-16: 19:00:00 via INTRAVENOUS

## 2019-02-16 MED ORDER — ADULT MULTIVITAMIN W/MINERALS CH
2.0000 | ORAL_TABLET | Freq: Every day | ORAL | Status: DC
  Administered 2019-02-16 – 2019-02-17 (×2): 2 via ORAL
  Filled 2019-02-16 (×2): qty 2

## 2019-02-16 MED ORDER — ASPIRIN EC 81 MG PO TBEC
81.0000 mg | DELAYED_RELEASE_TABLET | Freq: Every day | ORAL | Status: DC
  Administered 2019-02-17: 81 mg via ORAL
  Filled 2019-02-16 (×2): qty 1

## 2019-02-16 MED ORDER — ACETAMINOPHEN 650 MG RE SUPP: 650.0000 mg | RECTAL | Status: DC | PRN

## 2019-02-16 MED ORDER — STROKE: EARLY STAGES OF RECOVERY BOOK
Freq: Once | Status: AC
  Administered 2019-02-16: 19:00:00

## 2019-02-16 MED ORDER — SODIUM CHLORIDE 0.9% FLUSH: 3.0000 mL | Freq: Once | INTRAVENOUS | Status: DC

## 2019-02-16 MED ORDER — ZOLPIDEM TARTRATE 5 MG PO TABS
5.0000 mg | ORAL_TABLET | Freq: Every day | ORAL | Status: DC
  Administered 2019-02-16: 22:00:00 5 mg via ORAL
  Filled 2019-02-16: qty 1

## 2019-02-16 MED ORDER — LORAZEPAM 2 MG/ML IJ SOLN
1.0000 mg | Freq: Once | INTRAMUSCULAR | Status: AC
  Administered 2019-02-16: 21:00:00 1 mg via INTRAVENOUS
  Filled 2019-02-16: qty 1

## 2019-02-16 MED ORDER — ACETAMINOPHEN 160 MG/5ML PO SOLN
650.0000 mg | ORAL | Status: DC | PRN
  Filled 2019-02-16: qty 20.3

## 2019-02-16 NOTE — ED Provider Notes (Signed)
[facility] Emergency Department Provider Note    First MD Initiated Contact with Patient 02/16/19 1340     (approximate)  I have reviewed the triage vital signs and the nursing notes.   HISTORY  Chief Complaint Transient Ischemic Attack    HPI Tina Hardy is a 78 y.o. female with the below listed past medical history presents the ER for evaluation of right upper extremity weakness tremor as well as slurred speech that occurred earlier this morning when she was riding a car with her friend.  It was witnessed.  States the symptoms lasted roughly 10 to 15 minutes.  States that this is the third time is happened in a week.  Denies any chest pain blurry vision shortness of breath or headaches.  Does have significant family history of strokes.  She does smoke marijuana.    Past Medical History:  Diagnosis Date  . Colon polyp 07/18/2004   Hyperplastic  . Diverticulosis   . Hepatic hemangioma 07/16/2012  . Liver hemangioma   . Pernicious anemia   . Rectal bleed    Family History  Problem Relation Age of Onset  . Heart failure Mother   . Crohn's disease Other        neice  . Crohn's disease Other        nephew  . Colon cancer Neg Hx   . Liver cancer Neg Hx    Past Surgical History:  Procedure Laterality Date  . APPENDECTOMY  2012   Dr Zella Richer  . APPENDECTOMY    . CHOLECYSTECTOMY  05/30/2012   Procedure: LAPAROSCOPIC CHOLECYSTECTOMY WITH INTRAOPERATIVE CHOLANGIOGRAM;  Surgeon: Gayland Curry, MD,FACS;  Location: [place];  Service: General;  Laterality: N/A;  . DENTAL SURGERY    . HERNIA REPAIR    . INGUINAL HERNIA REPAIR  12/24/06   left; laparoscopic  . KNEE SURGERY     right  . MANDIBULAR HARDWARE REMOVAL N/A 07/04/2017   Procedure: REMOVAL OF IMPLANT MANDIBLE;  Surgeon: Michael Litter, DMD;  Location: [facility];  Service: Oral Surgery;  Laterality: N/A;  . UMBILICAL HERNIA REPAIR  12/24/06   with reduction of sigmoid colon which was incarcerated    Patient Active Problem List   Diagnosis Date Noted  . Rash and nonspecific skin eruption 09/25/2017  . Skin infection 09/25/2017  . 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 Chronic abdominal pain with cramping/bloating 02/04/2013  . Stress at home 02/04/2013  . Rectal bleeding 01/05/2013  . Hepatic hemangioma 07/16/2012  . Diverticulosis of colon 06/22/2008  . HEMOCCULT POSITIVE STOOL 06/22/2008  . COLONIC POLYPS, HYPERPLASTIC, HX OF 06/22/2008      Prior to Admission medications   Medication Sig Start Date End Date Taking? Authorizing Provider  ibuprofen (ADVIL,MOTRIN) 600 MG tablet Take 1 tablet (600 mg total) by mouth every 6 (six) hours as needed. 07/02/17   Isla Pence, MD  Multiple Vitamin (MULTIVITAMIN) tablet Take 2 tablets by mouth daily.     [provider]  NON FORMULARY as needed. Paul bragg apple cider vin with the mother     [provider]  zolpidem (AMBIEN) 5 MG tablet Take 5 mg by mouth at bedtime. 06/06/17   [provider]    Allergies Codeine, Codeine, Penicillins, and Penicillins    Social History Social History   Tobacco Use  . Smoking status: Former Smoker    Types: Cigarettes    Quit date: 12/04/1972    Years since quitting: 46.2  . Smokeless tobacco: Never  Used  Substance Use Topics  . Alcohol use: Yes    Comment: rarely  . Drug use: Yes    Types: Marijuana    Comment: "I will only tell you off the record"    Review of Systems Patient denies headaches, rhinorrhea, blurry vision, numbness, shortness of breath, chest pain, edema, cough, abdominal pain, nausea, vomiting, diarrhea, dysuria, fevers, rashes or hallucinations unless otherwise stated above in HPI. ____________________________________________   PHYSICAL EXAM:  VITAL SIGNS: Vitals:   02/16/19 1352 02/16/19 1400  BP: (!) 136/93 (!) 154/74  Pulse: 78 82  Resp: 17   Temp:    SpO2: 97% 98%    Constitutional: Alert and oriented.  Eyes: Conjunctivae are  normal.  Head: Atraumatic. Nose: No congestion/rhinnorhea. Mouth/Throat: Mucous membranes are moist.   Neck: No stridor. Painless ROM.  Cardiovascular: Normal rate, regular rhythm. Grossly normal heart sounds.  Good peripheral circulation. Respiratory: Normal respiratory effort.  No retractions. Lungs CTAB. Gastrointestinal: Soft and nontender. No distention. No abdominal bruits. No CVA tenderness. Genitourinary:  Musculoskeletal: No lower extremity tenderness nor edema.  No joint effusions. Neurologic:  CN- intact.  No facial droop, Normal FNF.  Normal heel to shin.  Sensation intact bilaterally. Normal speech and language. No gross focal neurologic deficits are appreciated. No gait instability. Skin:  Skin is warm, dry and intact. No rash noted. Psychiatric: Mood and affect are normal. Speech and behavior are normal.  ____________________________________________   LABS (all labs ordered are listed, but only abnormal results are displayed)  Results for orders placed or performed during the hospital encounter of 02/16/19 (from the past 24 hour(s))  Glucose, capillary     Status: None   Collection Time: 02/16/19 12:31 PM  Result Value Ref Range   Glucose-Capillary 98 70 - 99 mg/dL  Protime-INR     Status: None   Collection Time: 02/16/19 12:36 PM  Result Value Ref Range   Prothrombin Time 12.7 11.4 - 15.2 seconds   INR 1.0 0.8 - 1.2  APTT     Status: None   Collection Time: 02/16/19 12:36 PM  Result Value Ref Range   aPTT 28 24 - 36 seconds  CBC     Status: None   Collection Time: 02/16/19 12:36 PM  Result Value Ref Range   WBC 9.3 4.0 - 10.5 K/uL   RBC 4.74 3.87 - 5.11 MIL/uL   Hemoglobin 14.3 12.0 - 15.0 g/dL   HCT 43.7 36.0 - 46.0 %   MCV 92.2 80.0 - 100.0 fL   MCH 30.2 26.0 - 34.0 pg   MCHC 32.7 30.0 - 36.0 g/dL   RDW 12.7 11.5 - 15.5 %   Platelets 394 150 - 400 K/uL   nRBC 0.0 0.0 - 0.2 %  Differential     Status: None   Collection Time: 02/16/19 12:36 PM  Result  Value Ref Range   Neutrophils Relative % 61 %   Neutro Abs 5.7 1.7 - 7.7 K/uL   Lymphocytes Relative 28 %   Lymphs Abs 2.6 0.7 - 4.0 K/uL   Monocytes Relative 8 %   Monocytes Absolute 0.7 0.1 - 1.0 K/uL   Eosinophils Relative 2 %   Eosinophils Absolute 0.2 0.0 - 0.5 K/uL   Basophils Relative 1 %   Basophils Absolute 0.1 0.0 - 0.1 K/uL   Immature Granulocytes 0 %   Abs Immature Granulocytes 0.04 0.00 - 0.07 K/uL  Comprehensive metabolic panel     Status: Abnormal   Collection Time: 02/16/19 12:36  PM  Result Value Ref Range   Sodium 140 135 - 145 mmol/L   Potassium 4.0 3.5 - 5.1 mmol/L   Chloride 105 98 - 111 mmol/L   CO2 26 22 - 32 mmol/L   Glucose, Bld 101 (H) 70 - 99 mg/dL   BUN 16 8 - 23 mg/dL   Creatinine, Ser 0.71 0.44 - 1.00 mg/dL   Calcium 9.6 8.9 - 10.3 mg/dL   Total Protein 6.9 6.5 - 8.1 g/dL   Albumin 4.0 3.5 - 5.0 g/dL   AST 23 15 - 41 U/L   ALT 21 0 - 44 U/L   Alkaline Phosphatase 68 38 - 126 U/L   Total Bilirubin 0.8 0.3 - 1.2 mg/dL   GFR calc non Af Amer >60 >60 mL/min   GFR calc Af Amer >60 >60 mL/min   Anion gap 9 5 - 15   ____________________________________________  EKG My review and personal interpretation at Time: 12:34   Indication: slurred speech  Rate: 80  Rhythm: sinus Axis: normal Other: normal intervals, no stemi ____________________________________________  RADIOLOGY  I personally reviewed all radiographic images ordered to evaluate for the above acute complaints and reviewed radiology reports and findings.  These findings were personally discussed with the patient.  Please see medical record for radiology report.  ____________________________________________   PROCEDURES  Procedure(s) performed:  Procedures    Critical Care performed: no ____________________________________________   INITIAL IMPRESSION / ASSESSMENT AND PLAN / ED COURSE  Pertinent labs & imaging results that were available during my care of the patient were  reviewed by me and considered in my medical decision making (see chart for details).   DDX: cva, tia, hypoglycemia, dehydration, electrolyte abnormality, dissection, sepsis   Kamera Dattilo is a 78 y.o. who presents to the ED with symptoms concerning for TIA and is since resolved as described above.  She is afebrile mildly hypertensive.  Does have risk factors including family history and smoking.  Patient also very anxious.  CT imaging does not show any evidence of acute infarct.  Given these stuttering symptoms over the past week I do feel patient will require hospitalization for additional medical work-up.  Have discussed with the patient and available family all diagnostics and treatments performed thus far and all questions were answered to the best of my ability. The patient demonstrates understanding and agreement with plan.      The patient was evaluated in Emergency Department today for the symptoms described in the history of present illness. He/she was evaluated in the context of the global COVID-19 pandemic, which necessitated consideration that the patient might be at risk for infection with the SARS-CoV-2 virus that causes COVID-19. Institutional protocols and algorithms that pertain to the evaluation of patients at risk for COVID-19 are in a state of rapid change based on information released by regulatory bodies including the CDC and federal and state organizations. These policies and algorithms were followed during the patient's care in the ED.  As part of my medical decision making, I reviewed the following data within the [place] notes reviewed and incorporated, Labs reviewed, notes from prior ED visits and [place] Controlled Substance Database   ____________________________________________   FINAL CLINICAL IMPRESSION(S) / ED DIAGNOSES  Final diagnoses:  TIA (transient ischemic attack)      NEW MEDICATIONS STARTED DURING THIS VISIT:  New  Prescriptions   No medications on file     Note:  This document was prepared using Dragon voice recognition software  and may include unintentional dictation errors.    Merlyn Lot, MD 02/16/19 [phone]

## 2019-02-16 NOTE — ED Notes (Signed)
Hospitalitis at bedside

## 2019-02-16 NOTE — ED Notes (Signed)
Pt provided with a sandwich tray and ginger ale drink per pt's request.

## 2019-02-16 NOTE — ED Triage Notes (Signed)
Pt here with c/o having an episode about an hour and a half ago where her right hand began to tremble, and she was unable to talk, she could hear herself not making any sense, lasted about 10 min, her neighbor was witness, pt states this is her third time having these symptoms, occurred last week as well. All symptoms have since resolved. Pt denies any pain at this time.

## 2019-02-16 NOTE — H&P (Signed)
[place] [facility] [place] NAME: Tina Hardy    MR#:  [id number]  DATE OF BIRTH:  05-31-1940  DATE OF ADMISSION:  02/16/2019  PRIMARY CARE PHYSICIAN: Burnard Bunting, MD   REQUESTING/REFERRING PHYSICIAN: Quentin Cornwall  CHIEF COMPLAINT:   Chief Complaint  Patient presents with  . Transient Ischemic Attack    HISTORY OF PRESENT ILLNESS: Tina Hardy  is a 78 y.o. female with a known history of colon polyps, diverticulosis, pernicious anemia-not regular medical issues and does not take any regular medications.  She is going through very stressful situation in her life for last 1 to 2 years.  Her husband was a very wealthy man who died and left a state to manage to 1 of the niece, and as per patient she had cheated and stole all the money.  Patient had to move from [facility] to [place].  She has episodes where she gets anxious and worried about the things happening in her life. Last 2 to 3 days she had repeated episodes of having shaking and numbness in her arms and not able to walk steadily going up to her car.  On routine she is very active and healthy person and also goes to the gym regularly. Concerned with this complaint she decided to come to emergency room. CT scan of the head was negative and ER physician suggested to admit to hospitalist service for doing further work-up for TIA.   PAST MEDICAL HISTORY:   Past Medical History:  Diagnosis Date  . Colon polyp 07/18/2004   Hyperplastic  . Diverticulosis   . Hepatic hemangioma 07/16/2012  . Liver hemangioma   . Pernicious anemia   . Rectal bleed     PAST SURGICAL HISTORY:  Past Surgical History:  Procedure Laterality Date  . APPENDECTOMY  2012   Dr Zella Richer  . APPENDECTOMY    . CHOLECYSTECTOMY  05/30/2012   Procedure: LAPAROSCOPIC CHOLECYSTECTOMY WITH INTRAOPERATIVE CHOLANGIOGRAM;  Surgeon: Gayland Curry, MD,FACS;  Location: [facility];  Service: General;  Laterality: N/A;  . DENTAL  SURGERY    . HERNIA REPAIR    . INGUINAL HERNIA REPAIR  12/24/06   left; laparoscopic  . KNEE SURGERY     right  . MANDIBULAR HARDWARE REMOVAL N/A 07/04/2017   Procedure: REMOVAL OF IMPLANT MANDIBLE;  Surgeon: Michael Litter, DMD;  Location: [facility];  Service: Oral Surgery;  Laterality: N/A;  . UMBILICAL HERNIA REPAIR  12/24/06   with reduction of sigmoid colon which was incarcerated    SOCIAL HISTORY:  Social History   Tobacco Use  . Smoking status: Former Smoker    Types: Cigarettes    Quit date: 12/04/1972    Years since quitting: 46.2  . Smokeless tobacco: Never Used  Substance Use Topics  . Alcohol use: Yes    Comment: rarely    FAMILY HISTORY:  Family History  Problem Relation Age of Onset  . Heart failure Mother   . Crohn's disease Other        neice  . Crohn's disease Other        nephew  . Colon cancer Neg Hx   . Liver cancer Neg Hx     DRUG ALLERGIES:  Allergies  Allergen Reactions  . Codeine     REACTION: questionable  . Codeine Other (See Comments)    Unknown reaction   . Penicillins     REACTION: questionable  . Penicillins Other (See Comments)    Unknown reaction  Has  patient had a PCN reaction causing immediate rash, facial/tongue/throat swelling, SOB or lightheadedness with hypotension: n/a Has patient had a PCN reaction causing severe rash involving mucus membranes or skin necrosis: n/a Has patient had a PCN reaction that required hospitalization: n/a Has patient had a PCN reaction occurring within the last 10 years: n/a If all of the above answers are "NO", then may proceed with Cephalosporin use.     REVIEW OF SYSTEMS:   CONSTITUTIONAL: No fever, fatigue or weakness.  EYES: No blurred or double vision.  EARS, NOSE, AND THROAT: No tinnitus or ear pain.  RESPIRATORY: No cough, shortness of breath, wheezing or hemoptysis.  CARDIOVASCULAR: No chest pain, orthopnea, edema.  GASTROINTESTINAL: No nausea, vomiting, diarrhea or abdominal pain.   GENITOURINARY: No dysuria, hematuria.  ENDOCRINE: No polyuria, nocturia,  HEMATOLOGY: No anemia, easy bruising or bleeding SKIN: No rash or lesion. MUSCULOSKELETAL: No joint pain or arthritis.   NEUROLOGIC: No tingling, have numbness, no weakness.  PSYCHIATRY: No anxiety or depression.   MEDICATIONS AT HOME:  Prior to Admission medications   Medication Sig Start Date End Date Taking? Authorizing Provider  Multiple Vitamin (MULTIVITAMIN) tablet Take 2 tablets by mouth daily.    Yes [provider]  zolpidem (AMBIEN) 5 MG tablet Take 5 mg by mouth at bedtime. 06/06/17  Yes [provider]  ibuprofen (ADVIL,MOTRIN) 600 MG tablet Take 1 tablet (600 mg total) by mouth every 6 (six) hours as needed. Patient not taking: Reported on 02/16/2019 07/02/17   Isla Pence, MD  NON FORMULARY as needed. Paul bragg apple cider vin with the mother     [provider]      PHYSICAL EXAMINATION:   VITAL SIGNS: Blood pressure (!) 160/74, pulse 80, temperature 98.1 F (36.7 C), temperature source Oral, resp. rate 16, height 5\' 5"  (1.651 m), weight 72.6 kg, SpO2 97 %.  GENERAL:  78 y.o.-year-old patient lying in the bed with no acute distress.  EYES: Pupils equal, round, reactive to light and accommodation. No scleral icterus. Extraocular muscles intact.  HEENT: Head atraumatic, normocephalic. Oropharynx and nasopharynx clear.  NECK:  Supple, no jugular venous distention. No thyroid enlargement, no tenderness.  LUNGS: Normal breath sounds bilaterally, no wheezing, rales,rhonchi or crepitation. No use of accessory muscles of respiration.  CARDIOVASCULAR: S1, S2 normal. No murmurs, rubs, or gallops.  ABDOMEN: Soft, nontender, nondistended. Bowel sounds present. No organomegaly or mass.  EXTREMITIES: No pedal edema, cyanosis, or clubbing.  NEUROLOGIC: Cranial nerves II through XII are intact. Muscle strength 5/5 in all extremities. Sensation intact. Gait not checked.   PSYCHIATRIC: The patient is alert and oriented x 3.  SKIN: No obvious rash, lesion, or ulcer.   LABORATORY PANEL:   CBC Recent Labs  Lab 02/16/19 1236  WBC 9.3  HGB 14.3  HCT 43.7  PLT 394  MCV 92.2  MCH 30.2  MCHC 32.7  RDW 12.7  LYMPHSABS 2.6  MONOABS 0.7  EOSABS 0.2  BASOSABS 0.1   ------------------------------------------------------------------------------------------------------------------  Chemistries  Recent Labs  Lab 02/16/19 1236  NA 140  K 4.0  CL 105  CO2 26  GLUCOSE 101*  BUN 16  CREATININE 0.71  CALCIUM 9.6  AST 23  ALT 21  ALKPHOS 68  BILITOT 0.8   ------------------------------------------------------------------------------------------------------------------ estimated creatinine clearance is 59.7 mL/min (by C-G formula based on SCr of 0.71 mg/dL). ------------------------------------------------------------------------------------------------------------------ No results for input(s): TSH, T4TOTAL, T3FREE, THYROIDAB in the last 72 hours.  Invalid input(s): FREET3   Coagulation profile Recent Labs  Lab 02/16/19 1236  INR 1.0   ------------------------------------------------------------------------------------------------------------------- No results for input(s): DDIMER in the last 72 hours. -------------------------------------------------------------------------------------------------------------------  Cardiac Enzymes No results for input(s): CKMB, TROPONINI, MYOGLOBIN in the last 168 hours.  Invalid input(s): CK ------------------------------------------------------------------------------------------------------------------ Invalid input(s): POCBNP  ---------------------------------------------------------------------------------------------------------------  Urinalysis No results found for: COLORURINE, APPEARANCEUR, LABSPEC, PHURINE, GLUCOSEU, HGBUR, BILIRUBINUR, KETONESUR, PROTEINUR, UROBILINOGEN, NITRITE,  LEUKOCYTESUR   RADIOLOGY: Ct Head Wo Contrast  Result Date: 02/16/2019 CLINICAL DATA:  Possible stroke.  Right hand trembling EXAM: CT HEAD WITHOUT CONTRAST TECHNIQUE: Contiguous axial images were obtained from the base of the skull through the vertex without intravenous contrast. COMPARISON:  None. FINDINGS: Brain: No acute intracranial abnormality. Specifically, no hemorrhage, hydrocephalus, mass lesion, acute infarction, or significant intracranial injury. Vascular: No hyperdense vessel or unexpected calcification. Skull: No acute calvarial abnormality. Sinuses/Orbits: Visualized paranasal sinuses and mastoids clear. Orbital soft tissues unremarkable. Other: None IMPRESSION: No acute intracranial abnormality. Electronically Signed   By: Rolm Baptise M.D.   On: 02/16/2019 12:50    EKG: Orders placed or performed during the hospital encounter of 02/16/19  . ED EKG  . ED EKG    IMPRESSION AND PLAN:  *TIA We will keep on cardiac monitor. Get an MRI, echocardiogram, ultrasound Doppler study on carotids. Check lipid panel and hemoglobin A1c. We may start on aspirin and statins depending on the reports. Physical therapy, Occupational Therapy evaluation. Neurology evaluation if needed for further management.  *Hypertension Start on amlodipine small dose.   All the records are reviewed and case discussed with ED provider. Management plans discussed with the patient, family and they are in agreement.  CODE STATUS: DNR Code Status History    Date Active Date Inactive Code Status Order ID Comments User Context   07/04/2017 1357 07/08/2017 1755 Partial Code [id number]  Rush Farmer, MD ED   Advance Care Planning Activity    Questions for Most Recent Historical Code Status (Order [id number])    Question Answer Comment   In the event of cardiac or respiratory ARREST: Initiate Code Blue, Call Rapid Response Yes    In the event of cardiac or respiratory ARREST: Perform CPR No    In the  event of cardiac or respiratory ARREST: Perform Intubation/Mechanical Ventilation Yes    In the event of cardiac or respiratory ARREST: Use NIPPV/BiPAp only if indicated Yes    In the event of cardiac or respiratory ARREST: Administer ACLS medications if indicated Yes    In the event of cardiac or respiratory ARREST: Perform Defibrillation or Cardioversion if indicated No        TOTAL TIME TAKING CARE OF THIS PATIENT: 50 minutes.    Vaughan Basta M.D on 02/16/2019   Between 7am to 6pm - Pager - [phone]  After 6pm go to [email or web address] - password [email or web address] [facility]  Office  [phone]  CC: Primary care physician; Burnard Bunting, MD   Note: This dictation was prepared with Dragon dictation along with smaller phrase technology. Any transcriptional errors that result from this process are unintentional.

## 2019-02-16 NOTE — ED Notes (Signed)
Pt ambulatory to toilet w/o assistance and with a steady gait.

## 2019-02-16 NOTE — Progress Notes (Signed)
Family Meeting Note  Advance Directive:yes  Today a meeting took place with the Patient.   The following clinical team members were present during this meeting:MD  The following were discussed:[name] diagnosis: TIA, hypertension,, [name] progosis: Unable to determine and Goals for treatment: DNR  Additional follow-up to be provided: PMD  Time spent during discussion:20 minutes  Vaughan Basta, MD

## 2019-02-17 ENCOUNTER — Other Ambulatory Visit: Payer: Self-pay | Admitting: Internal Medicine

## 2019-02-17 ENCOUNTER — Observation Stay: Payer: Medicare Other

## 2019-02-17 ENCOUNTER — Observation Stay (HOSPITAL_BASED_OUTPATIENT_CLINIC_OR_DEPARTMENT_OTHER)
Admit: 2019-02-17 | Discharge: 2019-02-17 | Disposition: A | Discharge status: Home / Self Care | Payer: Medicare Other | Attending: Internal Medicine | Admitting: Internal Medicine

## 2019-02-17 DIAGNOSIS — G459 Transient cerebral ischemic attack, unspecified: Secondary | ICD-10-CM | POA: Diagnosis not present

## 2019-02-17 LAB — HEMOGLOBIN A1C
Hgb A1c MFr Bld: 5.8 % — ABNORMAL HIGH (ref 4.8–5.6)
Mean Plasma Glucose: 119.76 mg/dL

## 2019-02-17 LAB — ECHOCARDIOGRAM COMPLETE
Height: 65 in
Weight: 2557.34 oz

## 2019-02-17 LAB — SARS CORONAVIRUS 2 (TAT 6-24 HRS): SARS Coronavirus 2: NEGATIVE

## 2019-02-17 MED ORDER — ATORVASTATIN CALCIUM 10 MG PO TABS: 10.0000 mg | ORAL_TABLET | Freq: Every day | ORAL | 0 refills | Status: DC

## 2019-02-17 MED ORDER — IOHEXOL 350 MG/ML SOLN
75.0000 mL | Freq: Once | INTRAVENOUS | Status: AC | PRN
  Administered 2019-02-17: 16:00:00 75 mL via INTRAVENOUS

## 2019-02-17 MED ORDER — AMLODIPINE BESYLATE 5 MG PO TABS: 5.0000 mg | ORAL_TABLET | Freq: Every day | ORAL | 0 refills | Status: DC

## 2019-02-17 NOTE — Discharge Summary (Signed)
Tina Hardy, is a 78 y.o. female  DOB 14-Oct-1940  MRN [id number].  Admission date:  02/16/2019  Admitting Physician  Vaughan Basta, MD  Discharge Date:  02/17/2019   Primary MD  Burnard Bunting, MD  Recommendations for primary care physician for things to follow:   Follow with PCP in 1 week   Admission Diagnosis  TIA (transient ischemic attack) [G45.9]   Discharge Diagnosis  TIA (transient ischemic attack) [G45.9]   Principal Problem:   TIA (transient ischemic attack)      Past Medical History:  Diagnosis Date  . Colon polyp 07/18/2004   Hyperplastic  . Diverticulosis   . Hepatic hemangioma 07/16/2012  . Liver hemangioma   . Pernicious anemia   . Rectal bleed     Past Surgical History:  Procedure Laterality Date  . APPENDECTOMY  2012   Dr Zella Richer  . APPENDECTOMY    . CHOLECYSTECTOMY  05/30/2012   Procedure: LAPAROSCOPIC CHOLECYSTECTOMY WITH INTRAOPERATIVE CHOLANGIOGRAM;  Surgeon: Gayland Curry, MD,FACS;  Location: [facility];  Service: General;  Laterality: N/A;  . DENTAL SURGERY    . HERNIA REPAIR    . INGUINAL HERNIA REPAIR  12/24/06   left; laparoscopic  . KNEE SURGERY     right  . MANDIBULAR HARDWARE REMOVAL N/A 07/04/2017   Procedure: REMOVAL OF IMPLANT MANDIBLE;  Surgeon: Michael Litter, DMD;  Location: [facility];  Service: Oral Surgery;  Laterality: N/A;  . UMBILICAL HERNIA REPAIR  12/24/06   with reduction of sigmoid colon which was incarcerated       History of present illness and  Hospital Course:     Kindly see H&P for history of present illness and admission details, please review complete Labs, Consult reports and Test reports for all details in brief  HPI  from the history and physical done on the day of admission 78 year old female history of anxiety comes in because of tremors,  right upper extremity weakness, admitted for evaluation of TIA.   Hospital Course   #1. episodic weakness and numbness of the hands, aching attributed to panic attack rather than TIA patient is admitted for TIA evaluation, CT head is unremarkable, MRI of the brain showed chronic cerebellar infarct and chronic microvascular changes but no acute stroke. Patient neurologically stable, TIA work-up including echocardiogram, ultrasound of carotids are pending, can continue aspirin 81 mg daily secondary to chronic microvascular changes, chronic cerebellar infarct and patient did not have symptoms and she still close to [employer] daily and very active according to her she goes to gym every day. 2.  Told me that she lost her phone and also contact placed yesterday and threw her into a panic state.  And also told that her husband died but they were very rich and all the state-year-old to niece and according to them needs completely stronger on the property.  She is now living in a small house instead of 4000 square feet house before.  She is very anxious about I spoke with patient's other niece who have patient told me to call I called her and explained why she is here, explained that patient can have repeat lipid function checked.  Note that fasting lipid panel is not done in the hospital. #3 essential hypertension, use Norvasc prescription if needed patient can take 5 mg daily, advised the patient to have BP checked at home.  No history of hypertension before.  Can have Norvasc prescription 5 mg daily and take as needed if BP more than  150/80. #4 illicit substances, patient request to quit marijuana use, discharge Condition: Stable   Follow UP  Follow-up Information    Burnard Bunting, MD. Schedule an appointment as soon as possible for a visit in 1 week(s).   Specialty: Internal Medicine Contact information: [address] [place] [address] [phone]             Discharge  Instructions  and  Discharge Medications      Allergies as of 02/17/2019      Reactions   Codeine    REACTION: questionable   Codeine Other (See Comments)   Unknown reaction    Penicillins    REACTION: questionable   Penicillins Other (See Comments)   Unknown reaction  Has patient had a PCN reaction causing immediate rash, facial/tongue/throat swelling, SOB or lightheadedness with hypotension: n/a Has patient had a PCN reaction causing severe rash involving mucus membranes or skin necrosis: n/a Has patient had a PCN reaction that required hospitalization: n/a Has patient had a PCN reaction occurring within the last 10 years: n/a If all of the above answers are "NO", then may proceed with Cephalosporin use.      Medication List    TAKE these medications   amLODipine 5 MG tablet Commonly known as: NORVASC Take 1 tablet (5 mg total) by mouth daily. Start taking on: February 18, 2019   ibuprofen 600 MG tablet Commonly known as: ADVIL Take 1 tablet (600 mg total) by mouth every 6 (six) hours as needed.   multivitamin tablet Take 2 tablets by mouth daily.   NON FORMULARY as needed. Paul bragg apple cider vin with the mother   zolpidem 5 MG tablet Commonly known as: AMBIEN Take 5 mg by mouth at bedtime.         Diet and Activity recommendation: See Discharge Instructions above   Consults obtained -none   Major procedures and Radiology Reports - PLEASE review detailed and final reports for all details, in brief -      Ct Head Wo Contrast  Result Date: 02/16/2019 CLINICAL DATA:  Possible stroke.  Right hand trembling EXAM: CT HEAD WITHOUT CONTRAST TECHNIQUE: Contiguous axial images were obtained from the base of the skull through the vertex without intravenous contrast. COMPARISON:  None. FINDINGS: Brain: No acute intracranial abnormality. Specifically, no hemorrhage, hydrocephalus, mass lesion, acute infarction, or significant intracranial injury. Vascular: No  hyperdense vessel or unexpected calcification. Skull: No acute calvarial abnormality. Sinuses/Orbits: Visualized paranasal sinuses and mastoids clear. Orbital soft tissues unremarkable. Other: None IMPRESSION: No acute intracranial abnormality. Electronically Signed   By: Rolm Baptise M.D.   On: 02/16/2019 12:50   Mr Brain Wo Contrast  Result Date: 02/16/2019 CLINICAL DATA:  Right upper extremity weakness, tremor, and slurred speech lasting 10-15 minutes and happening recurrently over the past week. EXAM: MRI HEAD WITHOUT CONTRAST TECHNIQUE: Multiplanar, multiecho pulse sequences of the brain and surrounding structures were obtained without intravenous contrast. COMPARISON:  Head CT 02/16/2019 FINDINGS: Brain: There is no evidence of acute infarct, intracranial hemorrhage, mass, midline shift, or extra-axial fluid collection. The ventricles are normal. Patchy T2 hyperintensities scattered throughout the cerebral white matter bilaterally and in the pons are nonspecific but compatible with moderate chronic small vessel ischemic disease. There is a small chronic posterior right cerebellar infarct. Encephalomalacia anteriorly in the left frontal lobe may be related to remote trauma. Vascular: Small and suboptimally visualized right vertebral artery. Other major intracranial vascular flow voids, including that of a dominant appearing left vertebral artery,  are preserved. Skull and upper cervical spine: Unremarkable bone marrow signal. Sinuses/Orbits: Bilateral cataract extraction. Paranasal sinuses and mastoid air cells are clear. Other: None. IMPRESSION: 1. No acute intracranial abnormality. 2. Moderate chronic small vessel ischemic disease. 3. Small chronic right cerebellar infarct. 4. Anterior left frontal lobe encephalomalacia suggesting remote trauma. Electronically Signed   By: Logan Bores M.D.   On: 02/16/2019 21:53    Micro Results     Recent Results (from the past 240 hour(s))  SARS CORONAVIRUS 2  (TAT 6-24 HRS) Nasopharyngeal Nasopharyngeal Swab     Status: None   Collection Time: 02/16/19  2:32 PM   Specimen: Nasopharyngeal Swab  Result Value Ref Range Status   SARS Coronavirus 2 NEGATIVE NEGATIVE Final    Comment: (NOTE) SARS-CoV-2 target nucleic acids are NOT DETECTED. The SARS-CoV-2 RNA is generally detectable in upper and lower respiratory specimens during the acute phase of infection. Negative results do not preclude SARS-CoV-2 infection, do not rule out co-infections with other pathogens, and should not be used as the sole basis for treatment or other patient management decisions. Negative results must be combined with clinical observations, patient history, and epidemiological information. The expected result is Negative. Fact Sheet for Patients: SugarRoll.be Fact Sheet for Healthcare Providers: https://www.woods-mathews.com/ This test is not yet approved or cleared by the [place] FDA and  has been authorized for detection and/or diagnosis of SARS-CoV-2 by FDA under an Emergency Use Authorization (EUA). This EUA will remain  in effect (meaning this test can be used) for the duration of the COVID-19 declaration under Section 56 4(b)(1) of the Act, 21 U.S.C. section 360bbb-3(b)(1), unless the authorization is terminated or revoked sooner. Performed at [facility], [place] [address], [place], [place] [address]        Today   Subjective:   Tina Hardy today has no headache,no chest abdominal pain,no new weakness tingling or numbness, feels much better wants to go home today.  Objective:   Blood pressure 137/69, pulse 86, temperature 98.2 F (36.8 C), temperature source Oral, resp. rate 18, height 5\' 5"  (1.651 m), weight 72.5 kg, SpO2 97 %.   Intake/Output Summary (Last 24 hours) at 02/17/2019 1234 Last data filed at 02/17/2019 1017 Gross per 24 hour  Intake 516.64 ml  Output -  Net 516.64 ml     Exam Awake Alert, Oriented x 3, No new F.N deficits, Normal affect [place].AT,PERRAL Supple Neck,No JVD, No cervical lymphadenopathy appriciated.  Symmetrical Chest wall movement, Good air movement bilaterally, CTAB RRR,No Gallops,Rubs or new Murmurs, No Parasternal Heave +ve B.Sounds, Abd Soft, Non tender, No organomegaly appriciated, No rebound -guarding or rigidity. No Cyanosis, Clubbing or edema, No new Rash or bruise  Data Review   CBC w Diff:  Lab Results  Component Value Date   WBC 9.3 02/16/2019   HGB 14.3 02/16/2019   HCT 43.7 02/16/2019   PLT 394 02/16/2019   LYMPHOPCT 28 02/16/2019   MONOPCT 8 02/16/2019   EOSPCT 2 02/16/2019   BASOPCT 1 02/16/2019    CMP:  Lab Results  Component Value Date   NA 140 02/16/2019   K 4.0 02/16/2019   CL 105 02/16/2019   CO2 26 02/16/2019   BUN 16 02/16/2019   CREATININE 0.71 02/16/2019   PROT 6.9 02/16/2019   ALBUMIN 4.0 02/16/2019   BILITOT 0.8 02/16/2019   ALKPHOS 68 02/16/2019   AST 23 02/16/2019   ALT 21 02/16/2019  .   Total Time in preparing paper work,  data evaluation and todays exam - 35 minutes  Epifanio Lesches M.D on 02/17/2019 at 12:34 PM    Note: This dictation was prepared with Dragon dictation along with smaller phrase technology. Any transcriptional errors that result from this process are unintentional.

## 2019-02-17 NOTE — Progress Notes (Signed)
OT Screen Note  Patient Details Name: Tina Hardy MRN: [id number] DOB: 04/25/1941   Cancelled Treatment:    Reason Eval/Treat Not Completed: OT screened, no needs identified, will sign off. Consult received. Chart reviewed. Met with pt. No impairments or functional deficits appreciated. No skilled OT needs identified. Will sign off. Please re-consult if additional needs arise.   Jeni Salles, MPH, MS, OTR/L ascom [phone] 02/17/19, 10:04 AM

## 2019-02-17 NOTE — Progress Notes (Signed)
Spoke with patient's niece Tina Hardy about ultrasound of carotidresult and possible significant stenosis on the left side of carotids, will order CT Angio of carotids for further confirmation and if negative for significant stenosis ,patient can be discharged later in the evening if not patient to stay and have vascular surgery evaluation.  [name] niece. Tina Hardy is agreeable for this plan

## 2019-02-17 NOTE — Progress Notes (Signed)
*  PRELIMINARY RESULTS* Echocardiogram 2D Echocardiogram has been performed.  Sherrie Sport 02/17/2019, 1:05 PM

## 2019-02-17 NOTE — Evaluation (Signed)
Physical Therapy Evaluation Patient Details Name: Tina Hardy MRN: [id number] DOB: [birth year] Today's Date: 02/17/2019   History of Present Illness  Pt is a 78 y.o. female presenting to hospital 02/16/19 with R UE weakness/tremor and slurred speech for about 10-15 minutes (3rd time in about a week).  Pt admitted for TIA work-up.  PMH includes hernia repair and htn.  Clinical Impression  Prior to hospital admission, pt was independent and active working out at the gym.  Pt lives alone in 1 level home with steps to enter.  Currently pt is independent with bed mobility, transfers, and ambulation within room; no loss of balance noted with dynamic functional mobility activities.  No reports of pain.  Pt appearing strong in general and no impairments noted with B LE strength, tone, proprioception, and coordination.  No acute PT needs identified; will sign off.    Follow Up Recommendations No PT follow up    Equipment Recommendations  None recommended by PT    Recommendations for Other Services       Precautions / Restrictions Precautions Precautions: Fall Restrictions Weight Bearing Restrictions: No      Mobility  Bed Mobility Overal bed mobility: Independent             General bed mobility comments: no difficulties noted  Transfers Overall transfer level: Independent               General transfer comment: no difficulties noted  Ambulation/Gait Ambulation/Gait assistance: Independent Gait Distance (Feet): 140 Feet(in room) Assistive device: None Gait Pattern/deviations: WFL(Within Functional Limits) Gait velocity: normal   General Gait Details: steady ambulation; pt started dancing in middle of room during ambulation without any loss of balance  Stairs            Wheelchair Mobility    Modified Rankin (Stroke Patients Only)       Balance Overall balance assessment: Needs assistance Sitting-balance support: No upper extremity supported;Feet  supported Sitting balance-Leahy Scale: Normal Sitting balance - Comments: steady sitting reaching outside BOS   Standing balance support: No upper extremity supported;During functional activity Standing balance-Leahy Scale: Normal Standing balance comment: steady with ambulation and turns in room; steady standing reaching outside BOS                             Pertinent Vitals/Pain Pain Assessment: No/denies pain  Vitals (HR and O2 on room air) stable and WFL throughout treatment session.    Home Living Family/patient expects to be discharged to:: Private residence Living Arrangements: Alone Available Help at Discharge: Family;Friend(s) Type of Home: House Home Access: Stairs to enter Entrance Stairs-Rails: Left Entrance Stairs-Number of Steps: 4 Home Layout: One level        Prior Function Level of Independence: Independent         Comments: Works out at gym regularly.  (+) drives.     Hand Dominance        Extremity/Trunk Assessment   Upper Extremity Assessment Upper Extremity Assessment: (strong B hand grip strength; 5/5 B shoulder flexion and elbow flexion/extension.)    Lower Extremity Assessment Lower Extremity Assessment: (able to perform B LE SLR independently; 5/5 B hip flexion, knee flexion/extension, and DF.  Intact B LE light touch, coordination (heel to shin), proprioception, and tone.)    Cervical / Trunk Assessment Cervical / Trunk Assessment: Normal  Communication   Communication: No difficulties  Cognition Arousal/Alertness: Awake/alert Behavior During Therapy: WFL for tasks assessed/performed  Overall Cognitive Status: Within Functional Limits for tasks assessed                                        General Comments   Nursing cleared pt for participation in physical therapy.  Pt agreeable to PT session.  Mask offered to pt but pt preferring to not wear a mask during session stating that last time she threw up  when she had to wear a mask.    Exercises     Assessment/Plan    PT Assessment Patent does not need any further PT services  PT Problem List         PT Treatment Interventions      PT Goals (Current goals can be found in the Care Plan section)  Acute Rehab PT Goals Patient Stated Goal: to go home PT Goal Formulation: With patient Time For Goal Achievement: 02/26/19 Potential to Achieve Goals: Good    Frequency     Barriers to discharge        Co-evaluation               AM-PAC PT "6 Clicks" Mobility  Outcome Measure Help needed turning from your back to your side while in a flat bed without using bedrails?: None Help needed moving from lying on your back to sitting on the side of a flat bed without using bedrails?: None Help needed moving to and from a bed to a chair (including a wheelchair)?: None Help needed standing up from a chair using your arms (e.g., wheelchair or bedside chair)?: None Help needed to walk in hospital room?: None Help needed climbing 3-5 steps with a railing? : None 6 Click Score: 24    End of Session   Activity Tolerance: Patient tolerated treatment well Patient left: in bed;with call bell/phone within reach;with bed alarm set Nurse Communication: Mobility status;Precautions PT Visit Diagnosis: Muscle weakness (generalized) (M62.81)    Time: [id number] PT Time Calculation (min) (ACUTE ONLY): 24 min   Charges:   PT Evaluation $PT Eval Low Complexity: 1 Low          Nikolai Wilczak, PT 02/17/19, 9:44 AM [phone]

## 2019-02-17 NOTE — Progress Notes (Addendum)
SLP Cancellation Note  Patient Details Name: Audi Conover MRN: [id number] DOB: 03/12/1941   Cancelled treatment:       Reason Eval/Treat Not Completed: SLP screened, no needs identified, will sign off(chart reviewed; consulted NSG then met w/ pt in room). Pt denied any difficulty swallowing and is currently on a regular diet; tolerates swallowing pills w/ water per NSG. Pt conversed at conversational level w/out deficits noted w/ NSG then w/ SLP; pt denied any speech-language deficits this morning. Pt was quite engaging and funny. No further skilled ST services indicated as pt appears at her baseline. Encouraged pt to f/u w/ her PCP if any new concerns/questions post d/c. Pt agreed. NSG to reconsult if any change in status     Orinda Kenner, [place], CCC-SLP Watson,Katherine 02/17/2019, 10:54 AM

## 2019-02-17 NOTE — Plan of Care (Signed)
Pt is d/ced home.  Will f/u with vascular b/c CTA showed 65% proximal L ICA stenosis.  Educated pt on not smoking marijuana and watching her diet - not eating fried foods or foods high in salt.  Educated on reducing stress in her life and if she has any stroke symptoms to call 911 immediately.  IV removed. D/c instructions reviewed along with f/u appts. Her niece is coming to get her.

## 2019-02-17 NOTE — Care Management Obs Status (Signed)
[facility] NOTIFICATION   Patient Details  Name: Melissa Gohde MRN: [id number] Date of Birth: Sep 07, 1940   Medicare Observation Status Notification Given:  Yes    Candie Chroman, LCSW 02/17/2019, 3:36 PM

## 2019-03-19 ENCOUNTER — Other Ambulatory Visit: Payer: Self-pay | Admitting: Nurse Practitioner

## 2019-03-19 ENCOUNTER — Ambulatory Visit: Payer: Self-pay | Admitting: *Deleted

## 2019-03-19 ENCOUNTER — Telehealth: Payer: Self-pay | Admitting: Nurse Practitioner

## 2019-03-19 MED ORDER — AMLODIPINE BESYLATE 5 MG PO TABS: 5.0000 mg | ORAL_TABLET | Freq: Every day | ORAL | 0 refills | Status: DC

## 2019-03-19 MED ORDER — ASPIRIN 81 MG PO CHEW: 81.0000 mg | CHEWABLE_TABLET | Freq: Every day | ORAL | 0 refills | Status: DC

## 2019-03-19 MED ORDER — ATORVASTATIN CALCIUM 10 MG PO TABS: 10.0000 mg | ORAL_TABLET | Freq: Every day | ORAL | 0 refills | Status: DC

## 2019-03-19 NOTE — Telephone Encounter (Signed)
Sent for 30 day supply and will discuss more at new patient visit.  Look forward to meeting her.

## 2019-03-19 NOTE — Telephone Encounter (Signed)
Patient has a new patient appointment scheduled for 03/23/19 with Marnee Guarneri, NP. She takes amlodipine 5 mg tab daily and ASA 81 mg daily she reports. These were prescribed by [facility] during an ED visit September '19. She does not have a current PCP as listed on the chart. She is requesting enough tabs of both medications to last until her visit next week. She has taken the last of both medicines today.  Pharmacy is  [facility] in [place] to pcp for consideration, please.   Reason for Disposition . [1] Request for URGENT new prescription or refill of "essential" medication (i.e., likelihood of harm to patient if not taken) AND [2] triager unable to fill per unit policy  Answer Assessment - Initial Assessment Questions 1.   NAME of MEDICATION: "What medicine are you calling about?"     Amlodipine and ASA 2.   QUESTION: "What is your question?"    Needs refill 3.   PRESCRIBING HCP: "Who prescribed it?" Reason: if prescribed by specialist, call should be referred to that group.     [place] 4. SYMPTOMS: "Do you have any symptoms?"     None reported 5. SEVERITY: If symptoms are present, ask "Are they mild, moderate or severe?"      6.  PREGNANCY:  "Is there any chance that you are pregnant?" "When was your last menstrual period?"     na  Protocols used: MEDICATION QUESTION CALL-A-AH

## 2019-03-19 NOTE — Telephone Encounter (Signed)
Called and left patient a VM letting her know what [name] said.

## 2019-03-19 NOTE — Telephone Encounter (Signed)
TC to patient to inform her the requested medications have been sent to her pharmacy on file.

## 2019-03-22 ENCOUNTER — Encounter: Payer: Self-pay | Admitting: Nurse Practitioner

## 2019-03-22 DIAGNOSIS — R7303 Prediabetes: Secondary | ICD-10-CM | POA: Insufficient documentation

## 2019-03-22 DIAGNOSIS — I10 Essential (primary) hypertension: Secondary | ICD-10-CM | POA: Insufficient documentation

## 2019-03-22 DIAGNOSIS — E785 Hyperlipidemia, unspecified: Secondary | ICD-10-CM | POA: Insufficient documentation

## 2019-03-23 ENCOUNTER — Encounter: Payer: Self-pay | Admitting: Nurse Practitioner

## 2019-03-23 ENCOUNTER — Ambulatory Visit (INDEPENDENT_AMBULATORY_CARE_PROVIDER_SITE_OTHER): Payer: Self-pay | Admitting: Nurse Practitioner

## 2019-03-23 ENCOUNTER — Other Ambulatory Visit: Payer: Self-pay

## 2019-03-23 VITALS — BP 120/82 | HR 86 | Temp 98.5°F | Ht 63.5 in | Wt 155.0 lb

## 2019-03-23 DIAGNOSIS — F1290 Cannabis use, unspecified, uncomplicated: Secondary | ICD-10-CM | POA: Insufficient documentation

## 2019-03-23 DIAGNOSIS — I10 Essential (primary) hypertension: Secondary | ICD-10-CM

## 2019-03-23 DIAGNOSIS — I6522 Occlusion and stenosis of left carotid artery: Secondary | ICD-10-CM | POA: Insufficient documentation

## 2019-03-23 DIAGNOSIS — E782 Mixed hyperlipidemia: Secondary | ICD-10-CM

## 2019-03-23 DIAGNOSIS — F5104 Psychophysiologic insomnia: Secondary | ICD-10-CM

## 2019-03-23 DIAGNOSIS — G4700 Insomnia, unspecified: Secondary | ICD-10-CM | POA: Insufficient documentation

## 2019-03-23 MED ORDER — AMLODIPINE BESYLATE 5 MG PO TABS: 5.0000 mg | ORAL_TABLET | Freq: Every day | ORAL | 3 refills | Status: DC

## 2019-03-23 MED ORDER — ATORVASTATIN CALCIUM 10 MG PO TABS: 10.0000 mg | ORAL_TABLET | Freq: Every day | ORAL | 3 refills | Status: DC

## 2019-03-23 NOTE — Assessment & Plan Note (Signed)
Reports occasional use.  Recommend cutting back on this.  If anxiety or stressors may consider trial of Sertraline to assist with mood.

## 2019-03-23 NOTE — Assessment & Plan Note (Signed)
Chronic, ongoing.  Initial BP elevated, but repeat manual at goal.  Continue Amlodipine and ASA daily.  Recommend checking BP at home at least 3 mornings a week.  Return in 4 weeks and will labs.
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
## 2019-03-23 NOTE — Assessment & Plan Note (Signed)
Chronic, ongoing.  Continue off Ambien.  Discussed with patient and recommend not to take this.  Recommend use of Melatonin as needed at home for sleep.

## 2019-03-23 NOTE — Patient Instructions (Signed)
Melatonin oral solid dosage forms What is this medicine? MELATONIN (mel uh TOH nin) is a dietary supplement. It is mostly promoted to help maintain normal sleep patterns. The FDA has not approved this supplement for any medical use. This supplement may be used for other purposes; ask your health care provider or pharmacist if you have questions. This medicine may be used for other purposes; ask your health care provider or pharmacist if you have questions. COMMON BRAND NAME(S): Melatonex What should I tell my health care provider before I take this medicine? They need to know if you have any of these conditions:  cancer  depression or mental illness  diabetes  hormone problems  if you often drink alcohol  immune system problems  liver disease  lung or breathing disease, like asthma  organ transplant  seizure disorder  an unusual or allergic reaction to melatonin, other medicines, foods, dyes, or preservatives  pregnant or trying to get pregnant  breast-feeding How should I use this medicine? Take this supplement by mouth with a glass of water. Do not take with food. This supplement is usually taken 1 or 2 hours before bedtime. After taking this supplement, limit your activities to those needed to prepare for bed. Some products may be chewed or dissolved in the mouth before swallowing. Some tablets or capsules must be swallowed whole; do not cut, crush or chew. Follow the directions on the package labeling, or take as directed by your health care professional. Do not take this supplement more often than directed. Talk to your pediatrician regarding the use of this supplement in children. Special care may be needed. This supplement is not recommended for use in children without a prescription. Overdosage: If you think you have taken too much of this medicine contact a poison control center or emergency room at once. NOTE: This medicine is only for you. Do not share this medicine  with others. What if I miss a dose? If you miss taking your dose at the usual time, skip that dose. If it is almost time for your next dose, take only that dose. Do not take double or extra doses. What may interact with this medicine? Do not take this medicine with any of the following medications:  fluvoxamine  ramelteon  tasimelteon This medicine may also interact with the following medications:  alcohol  caffeine  carbamazepine  certain antibiotics like ciprofloxacin  certain medicines for depression, anxiety, or psychotic disturbances  cimetidine  female hormones, like estrogens and birth control pills, patches, rings, or injections  methoxsalen  nifedipine  other medications for sleep  other herbal or dietary supplements  phenobarbital  rifampin  smoking tobacco  tamoxifen  warfarin This list may not describe all possible interactions. Give your health care provider a list of all the medicines, herbs, non-prescription drugs, or dietary supplements you use. Also tell them if you smoke, drink alcohol, or use illegal drugs. Some items may interact with your medicine. What should I watch for while using this medicine? See your doctor if your symptoms do not get better or if they get worse. Do not take this supplement for more than 2 weeks unless your doctor tells you to. You may get drowsy or dizzy. Do not drive, use machinery, or do anything that needs mental alertness until you know how this medicine affects you. Do not stand or sit up quickly, especially if you are an older patient. This reduces the risk of dizzy or fainting spells. Alcohol may interfere with   the effect of this medicine. Avoid alcoholic drinks. After taking this medicine, you may get up out of bed and do an activity that you do not know you are doing. The next morning, you may have no memory of this. Activities include driving a car ("sleep-driving"), making and eating food, talking on the phone,  sexual activity, and sleep-walking. Serious injuries have occurred. Call your doctor right away if you find out you have done any of these activities. Do not take this medicine if you have used alcohol that evening. Do not take it if you have taken another medicine for sleep. The risk of doing these sleep-related activities is higher. Talk to your doctor before you use this supplement if you are currently being treated for an emotional, mental, or sleep problem. This medicine may interfere with your treatment. Herbal or dietary supplements are not regulated like medicines. Rigid quality control standards are not required for dietary supplements. The purity and strength of these products can vary. The safety and effect of this dietary supplement for a certain disease or illness is not well known. This product is not intended to diagnose, treat, cure or prevent any disease. The Food and Drug Administration suggests the following to help consumers protect themselves:  Always read product labels and follow directions.  Natural does not mean a product is safe for humans to take.  Look for products that include USP after the ingredient name. This means that the manufacturer followed the standards of the [place] Pharmacopoeia.  Supplements made or sold by a nationally known food or drug company are more likely to be made under tight controls. You can write to the company for more information about how the product was made. What side effects may I notice from receiving this medicine? Side effects that you should report to your doctor or health care professional as soon as possible:  allergic reactions like skin rash, itching or hives, swelling of the face, lips, or tongue  breathing problems  confusion  depressed mood, irritable, or other changes in moods or behaviors  feeling faint or lightheaded, falls  increased blood pressure  irregular or missed menstrual periods  signs and symptoms of liver  injury like dark yellow or brown urine; general ill feeling or flu-like symptoms; light-colored stools; loss of appetite; nausea; right upper belly pain; unusually weak or tired; yellowing of the eyes or skin  trouble staying awake or alert during the day  unusual activities while you are still asleep like driving, eating, making phone calls  unusual bleeding or bruising Side effects that usually do not require medical attention (report to your doctor or health care professional if they continue or are bothersome):  dizziness  drowsiness  headache  hot flashes  nausea  tiredness  unusual dreams or nightmares  upset stomach This list may not describe all possible side effects. Call your doctor for medical advice about side effects. You may report side effects to FDA at 1-800-FDA-1088. Where should I keep my medicine? Keep out of the reach of children. Store at room temperature or as directed on the package label. Protect from moisture. Throw away any unused supplement after the expiration date. NOTE: This sheet is a summary. It may not cover all possible information. If you have questions about this medicine, talk to your doctor, pharmacist, or health care provider.  2020 Elsevier/Gold Standard (2017-11-01 12:11:58)  

## 2019-03-23 NOTE — Assessment & Plan Note (Signed)
Chronic, ongoing.  Continue current medication regimen and adjust as needed.  Labs next visit.    

## 2019-03-23 NOTE — Assessment & Plan Note (Signed)
Has not been seen by vascular as recommended during ER visit, will place referral to vascular for further assessment.

## 2019-03-24 ENCOUNTER — Telehealth: Payer: Self-pay | Admitting: Nurse Practitioner

## 2019-03-24 NOTE — Telephone Encounter (Signed)
Did she schedule follow-up?  She needs one for 4 weeks.

## 2019-03-24 NOTE — Telephone Encounter (Signed)
Thank you. Noted.

## 2019-03-24 NOTE — Telephone Encounter (Signed)
Pt. understood and made 4 week f/u appt she will call to make apt if anxiety does not get better for now she just wants to schedule her 4 week fallow up.

## 2019-03-24 NOTE — Telephone Encounter (Signed)
Please alert pharmacy this was not discussed at recent visit and no new script sent.  Patient also did not schedule follow-up prior to leaving office.  Please call and alert her that if she wishes to discuss anxiety I would ask she come into office next week and we can discuss safe options for anxiety medication.

## 2019-03-24 NOTE — Telephone Encounter (Signed)
Tina Hardy called in to be advised. Pharmacy says that pt came in to pick up medication but seemed to be looking for a anti-anxiety medication as well? Pharmacy would like to confirm if they are suppose to have a new Rx for pt?

## 2019-03-24 NOTE — Telephone Encounter (Signed)
Please get patient scheduled for a 4 week follow up.

## 2019-03-24 NOTE — Telephone Encounter (Signed)
Thank you :)

## 2019-03-24 NOTE — Telephone Encounter (Signed)
Called and spoke with patient, she wanted to know about her atorvastatin and amlodipine, I let her that they were both at [facility] drug with a 90 day supply.

## 2019-03-26 ENCOUNTER — Other Ambulatory Visit: Payer: Self-pay

## 2019-03-26 ENCOUNTER — Encounter (INDEPENDENT_AMBULATORY_CARE_PROVIDER_SITE_OTHER): Payer: Self-pay | Admitting: Vascular Surgery

## 2019-03-26 ENCOUNTER — Ambulatory Visit (INDEPENDENT_AMBULATORY_CARE_PROVIDER_SITE_OTHER): Payer: Medicare Other | Admitting: Vascular Surgery

## 2019-03-26 ENCOUNTER — Encounter (INDEPENDENT_AMBULATORY_CARE_PROVIDER_SITE_OTHER): Payer: Self-pay

## 2019-03-26 VITALS — BP 149/81 | HR 101 | Resp 18 | Ht 63.0 in | Wt 154.0 lb

## 2019-03-26 DIAGNOSIS — I10 Essential (primary) hypertension: Secondary | ICD-10-CM | POA: Diagnosis not present

## 2019-03-26 DIAGNOSIS — I6522 Occlusion and stenosis of left carotid artery: Secondary | ICD-10-CM | POA: Diagnosis not present

## 2019-03-26 DIAGNOSIS — E782 Mixed hyperlipidemia: Secondary | ICD-10-CM | POA: Diagnosis not present

## 2019-03-26 DIAGNOSIS — G459 Transient cerebral ischemic attack, unspecified: Secondary | ICD-10-CM

## 2019-03-26 NOTE — Progress Notes (Signed)
MRN : [id number]  Tina Hardy is a 78 y.o. ([birth year]) female who presents with chief complaint of  Chief Complaint  Patient presents with  . New Patient (Initial Visit)    review CT for carotid stenosis  .  History of Present Illness:  The patient is seen for evaluation of a TIA episode. The patient describes it as right sided weakness and numbness.  It lasted on the order of minutes and resolved completely.  There was no loss of consciousness.  There have been two or three prior episodes over the past several months.  There is no prior documented CVA.  The patient was not taking enteric-coated aspirin 81 mg daily at the time.  There is a history of migraine headaches. There is no history of seizures.  The patient has a history of coronary artery disease, no recent episodes of angina or shortness of breath. The patient denies PAD or claudication symptoms. There is a history of hyperlipidemia which is being treated with a statin.    Current Meds  Medication Sig  . amLODipine (NORVASC) 5 MG tablet Take 1 tablet (5 mg total) by mouth daily.  Marland Kitchen atorvastatin (LIPITOR) 10 MG tablet Take 1 tablet (10 mg total) by mouth daily.  . Multiple Vitamin (MULTIVITAMIN) tablet Take 2 tablets by mouth daily.   Marland Kitchen zolpidem (AMBIEN) 5 MG tablet Take 5 mg by mouth at bedtime.   Current Facility-Administered Medications for the 03/26/19 encounter (Office Visit) with Delana Meyer, Dolores Lory, MD  Medication  . 0.9 %  sodium chloride infusion    Past Medical History:  Diagnosis Date  . Colon polyp 07/18/2004   Hyperplastic  . Diverticulosis   . Hepatic hemangioma 07/16/2012  . Hypertension   . Liver hemangioma   . Pernicious anemia   . Rectal bleed     Past Surgical History:  Procedure Laterality Date  . APPENDECTOMY  2012   Dr Zella Richer  . APPENDECTOMY    . CHOLECYSTECTOMY  05/30/2012   Procedure: LAPAROSCOPIC CHOLECYSTECTOMY WITH INTRAOPERATIVE CHOLANGIOGRAM;  Surgeon: Gayland Curry,  MD,FACS;  Location: [facility];  Service: General;  Laterality: N/A;  . DENTAL SURGERY    . HERNIA REPAIR    . INGUINAL HERNIA REPAIR  12/24/06   left; laparoscopic  . KNEE SURGERY     right  . MANDIBULAR HARDWARE REMOVAL N/A 07/04/2017   Procedure: REMOVAL OF IMPLANT MANDIBLE;  Surgeon: Michael Litter, DMD;  Location: [facility];  Service: Oral Surgery;  Laterality: N/A;  . UMBILICAL HERNIA REPAIR  12/24/06   with reduction of sigmoid colon which was incarcerated    Social History Social History   Tobacco Use  . Smoking status: Former Smoker    Types: Cigarettes    Quit date: 12/04/1972    Years since quitting: 46.3  . Smokeless tobacco: Never Used  Substance Use Topics  . Alcohol use: Yes    Comment: rarely  . Drug use: Not Currently    Types: Marijuana    Comment: "I will only tell you off the record"    Family History Family History  Problem Relation Age of Onset  . Heart failure Mother   . Crohn's disease Other        neice  . Crohn's disease Other        nephew  . Diabetes Sister   . Colon cancer Neg Hx   . Liver cancer Neg Hx   No family history of bleeding/clotting disorders, porphyria or autoimmune disease  Allergies  Allergen Reactions  . Codeine     REACTION: questionable  . Codeine Other (See Comments)    Unknown reaction   . Penicillins     REACTION: questionable  . Penicillins Other (See Comments)    Unknown reaction  Has patient had a PCN reaction causing immediate rash, facial/tongue/throat swelling, SOB or lightheadedness with hypotension: n/a Has patient had a PCN reaction causing severe rash involving mucus membranes or skin necrosis: n/a Has patient had a PCN reaction that required hospitalization: n/a Has patient had a PCN reaction occurring within the last 10 years: n/a If all of the above answers are "NO", then may proceed with Cephalosporin use.      REVIEW OF SYSTEMS (Negative unless checked)  Constitutional: [] Weight loss  [] Fever  [] Chills  Cardiac: [] Chest pain   [] Chest pressure   [] Palpitations   [] Shortness of breath when laying flat   [] Shortness of breath with exertion. Vascular:  [] Pain in legs with walking   [] Pain in legs at rest  [] History of DVT   [] Phlebitis   [] Swelling in legs   [] Varicose veins   [] Non-healing ulcers Pulmonary:   [] Uses home oxygen   [] Productive cough   [] Hemoptysis   [] Wheeze  [] COPD   [] Asthma Neurologic:  [] Dizziness   [] Seizures   [] History of stroke   [x] History of TIA  [] Aphasia   [] Vissual changes   [x] Weakness or numbness in arm   [x] Weakness or numbness in leg Musculoskeletal:   [] Joint swelling   [] Joint pain   [] Low back pain Hematologic:  [] Easy bruising  [] Easy bleeding   [] Hypercoagulable state   [] Anemic Gastrointestinal:  [] Diarrhea   [] Vomiting  [] Gastroesophageal reflux/heartburn   [] Difficulty swallowing. Genitourinary:  [] Chronic kidney disease   [] Difficult urination  [] Frequent urination   [] Blood in urine Skin:  [] Rashes   [] Ulcers  Psychological:  [] History of anxiety   []  History of major depression.  Physical Examination  Vitals:   03/26/19 1510  BP: (!) 149/81  Pulse: (!) 101  Resp: 18  Weight: 154 lb (69.9 kg)  Height: 5\' 3"  (1.6 m)   Body mass index is 27.28 kg/m. Gen: WD/WN, NAD Head: [place]/AT, No temporalis wasting.  Ear/Nose/Throat: Hearing grossly intact, nares w/o erythema or drainage, poor dentition Eyes: PER, EOMI, sclera nonicteric.  Neck: Supple, no masses.  No bruit or JVD.  Pulmonary:  Good air movement, clear to auscultation bilaterally, no use of accessory muscles.  Cardiac: RRR, normal S1, S2, no Murmurs. Vascular: left carotid bruit Vessel Right Left  Radial Palpable Palpable  Brachial Palpable Palpable  Carotid Palpable Palpable  Gastrointestinal: soft, non-distended. No guarding/no peritoneal signs.  Musculoskeletal: M/S 5/5 throughout.  No deformity or atrophy.  Neurologic: CN 2-12 intact. Pain and light touch intact in extremities.   Symmetrical.  Speech is fluent. Motor exam as listed above. Psychiatric: Judgment intact, Mood & affect appropriate for pt's clinical situation. Dermatologic: No rashes or ulcers noted.  No changes consistent with cellulitis. Lymph : No Cervical lymphadenopathy, no lichenification or skin changes of chronic lymphedema.  CBC Lab Results  Component Value Date   WBC 9.3 02/16/2019   HGB 14.3 02/16/2019   HCT 43.7 02/16/2019   MCV 92.2 02/16/2019   PLT 394 02/16/2019    BMET    Component Value Date/Time   NA 140 02/16/2019 1236   K 4.0 02/16/2019 1236   CL 105 02/16/2019 1236   CO2 26 02/16/2019 1236   GLUCOSE 101 (H) 02/16/2019 1236  BUN 16 02/16/2019 1236   CREATININE 0.71 02/16/2019 1236   CALCIUM 9.6 02/16/2019 1236   GFRNONAA >60 02/16/2019 1236   GFRAA >60 02/16/2019 1236   CrCl cannot be calculated ([name] most recent lab result is older than the maximum 21 days allowed.).  COAG Lab Results  Component Value Date   INR 1.0 02/16/2019    Radiology No results found.   Assessment/Plan 1. Stenosis of left carotid artery Recommend:  The patient is symptomatic with respect to the carotid stenosis.  The patient has a lesion that is >90%.  [name] CT angiography of the carotid arteries confirms >90% left ICA stenosis.  The anatomical considerations support stenting over surgery.  This was discussed in detail with the patient.  [facility] pamphlet regarding carotid treatment was given to the patient.  The risks, benefits and alternative therapies were reviewed in detail with the patient.  All questions were answered.  The patient agrees to proceed with stenting of the left carotid artery.  Continue antiplatelet therapy as prescribed. Continue management of CAD, HTN and Hyperlipidemia. Healthy heart diet, encouraged exercise at least 4 times per week.    A total of 70 minutes was spent with this patient and greater than 50% was spent in counseling and coordination  of care with the patient.  Discussion included the treatment options for vascular disease including indications for surgery and intervention.  Also discussed is the appropriate timing of treatment.  In addition medical therapy was discussed.  2. TIA (transient ischemic attack) Recommend:  The patient is symptomatic with respect to the carotid stenosis.  The patient has a lesion that is >90%.  [name] CT angiography of the carotid arteries confirms >90% left ICA stenosis.  The anatomical considerations support stenting over surgery.  This was discussed in detail with the patient.  The risks, benefits and alternative therapies were reviewed in detail with the patient.  All questions were answered.  The patient agrees to proceed with stenting of the left carotid artery.  Continue antiplatelet therapy as prescribed. Continue management of CAD, HTN and Hyperlipidemia. Healthy heart diet, encouraged exercise at least 4 times per week.    3. Essential hypertension Continue antihypertensive medications as already ordered, these medications have been reviewed and there are no changes at this time.   4. Mixed hyperlipidemia Continue statin as ordered and reviewed, no changes at this time     Hortencia Pilar, MD  03/26/2019 9:11 PM

## 2019-04-07 ENCOUNTER — Encounter (INDEPENDENT_AMBULATORY_CARE_PROVIDER_SITE_OTHER): Payer: Self-pay

## 2019-04-07 ENCOUNTER — Telehealth (INDEPENDENT_AMBULATORY_CARE_PROVIDER_SITE_OTHER): Payer: Self-pay

## 2019-04-07 NOTE — Telephone Encounter (Signed)
I attempted to contact the patient and a message was left for a return call. 

## 2019-04-07 NOTE — Telephone Encounter (Signed)
Spoke with patient's son and she is now scheduled with Drs. Schnier/Dew for a left carotid stent placement on 04/22/2019 with a 11:30 am arrival time to the [facility]. Patient will do her pre-op on 04/10/2019 @ 11:00 am and Covid testing on 04/20/2019 between 12:30-2:30 pm at the [facility].This information will be mailed to the patient's home.

## 2019-04-10 ENCOUNTER — Other Ambulatory Visit (INDEPENDENT_AMBULATORY_CARE_PROVIDER_SITE_OTHER): Payer: Self-pay | Admitting: Nurse Practitioner

## 2019-04-10 ENCOUNTER — Inpatient Hospital Stay: Admission: RE | Admit: 2019-04-10 | Payer: Medicare Other | Source: Ambulatory Visit

## 2019-04-15 IMAGING — CT CT MAXILLOFACIAL W/O CM
3 of 4 series · 15 of 47 positions shown, 18 images · non-contrast
Comparison: None.

ADDENDUM:
The case was reviewed with Dr. East, ENT surgeon, with regard to
the recent dental surgery. The patient's mandibular dental implant
breaches the posterior mandibular cortex and projects into the oral
cavity. See series 4, image 59, and series 10, image 39.
CLINICAL DATA: Recent dental surgery 6 days ago. Worsening throat
swelling. Patient feels a lump on the right temple.

EXAM:
CT MAXILLOFACIAL WITHOUT CONTRAST
TECHNIQUE: Multidetector CT imaging of the maxillofacial structures was
performed. Multiplanar CT image reconstructions were also generated.

[Series 3: max soft · axial · 0.35mm/px · z∈[-132,-8]mm · 10 of 74 slices shown, 13 images]
[im 6/74  brain]
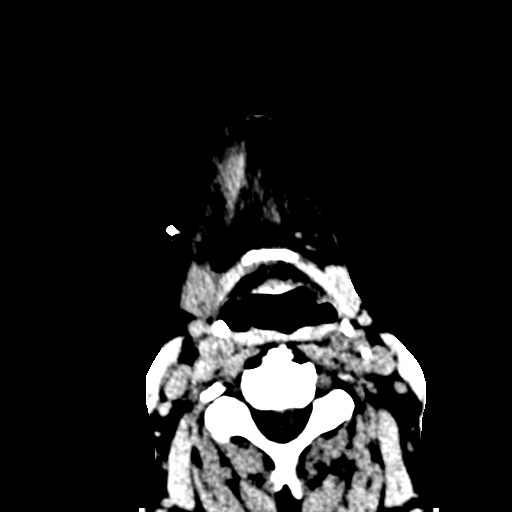
[im 6/74  bone]
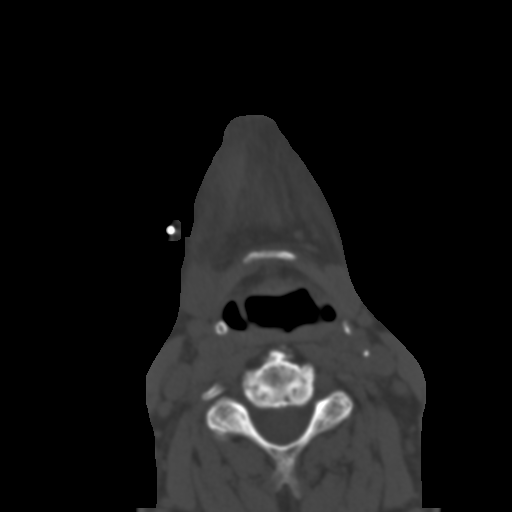
[im 13/74  bone]
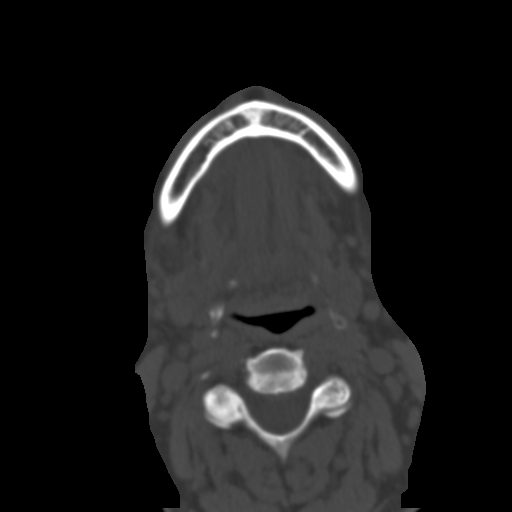
[im 21/74  bone]
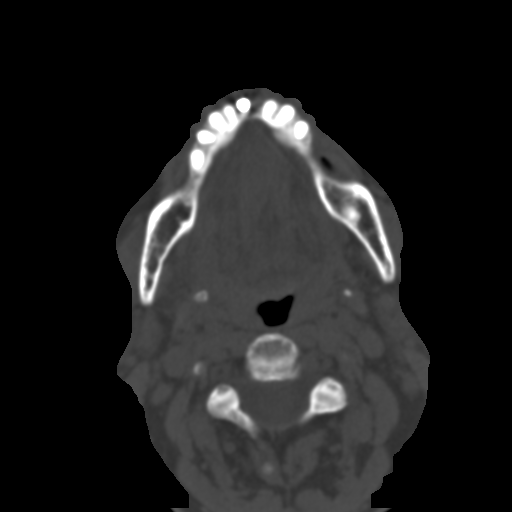
[im 26/74  bone]
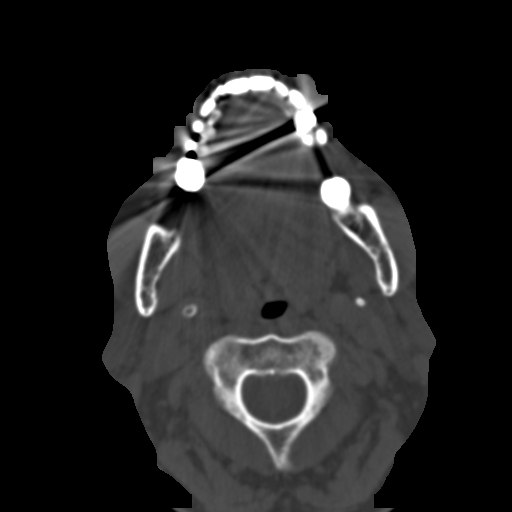
[im 33/74  brain]
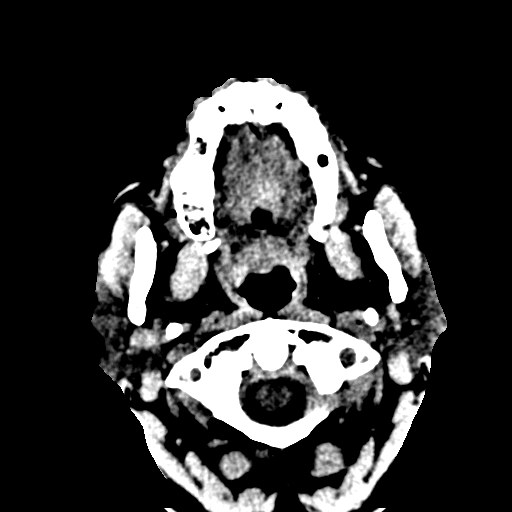
[im 33/74  bone]
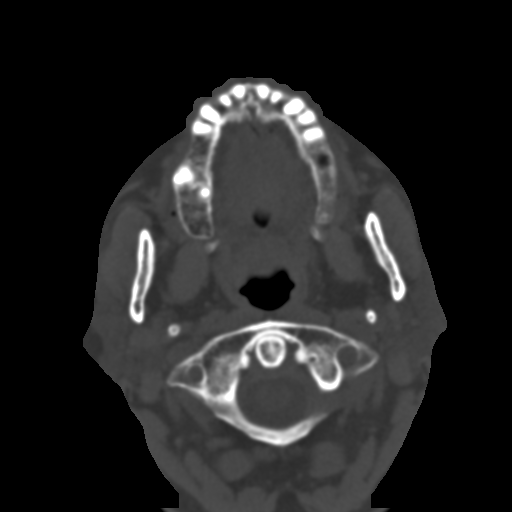
[im 41/74  bone]
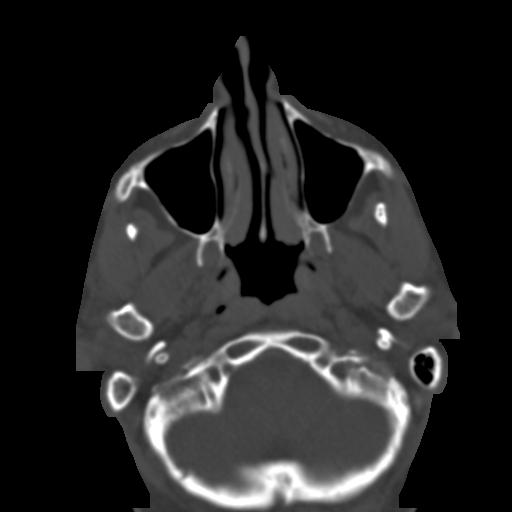
[im 48/74  bone]
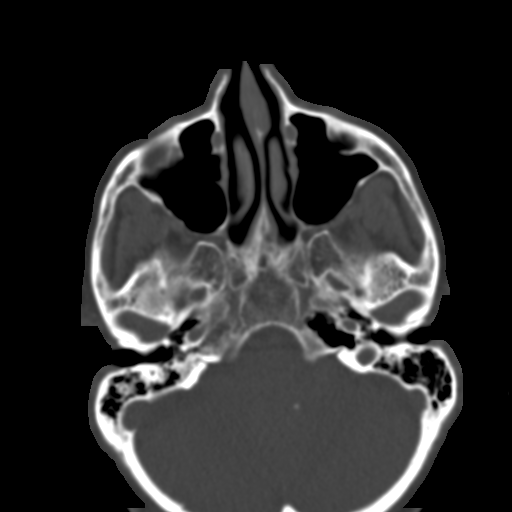
[im 56/74  bone]
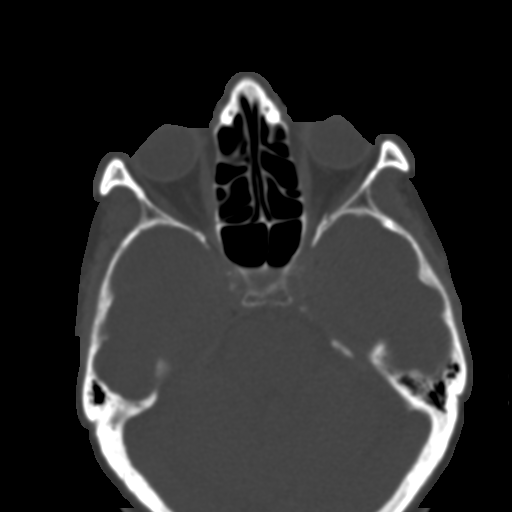
[im 61/74  brain]
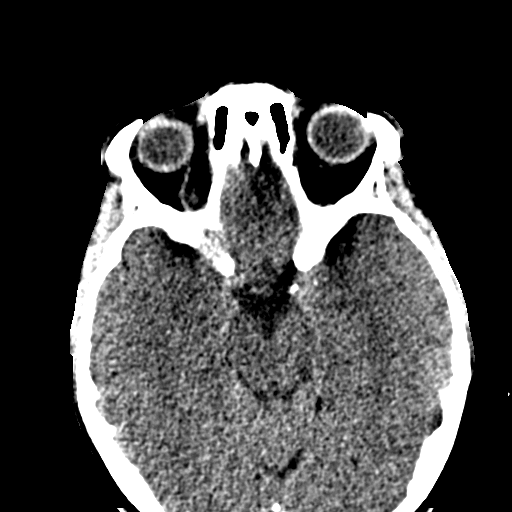
[im 61/74  bone]
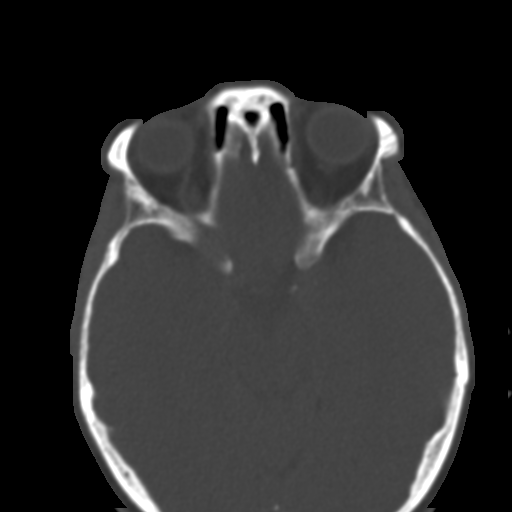
[im 68/74  bone]
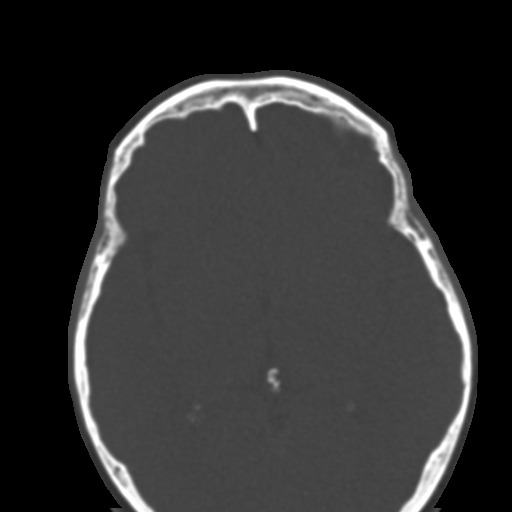

[Series 7: coronal soft · coronal · 0.30mm/px · 3 of 74 slices shown]
[im 25/74  bone]
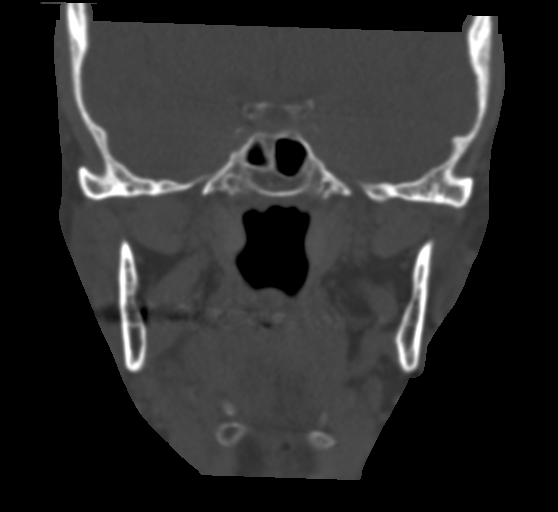
[im 33/74  bone]
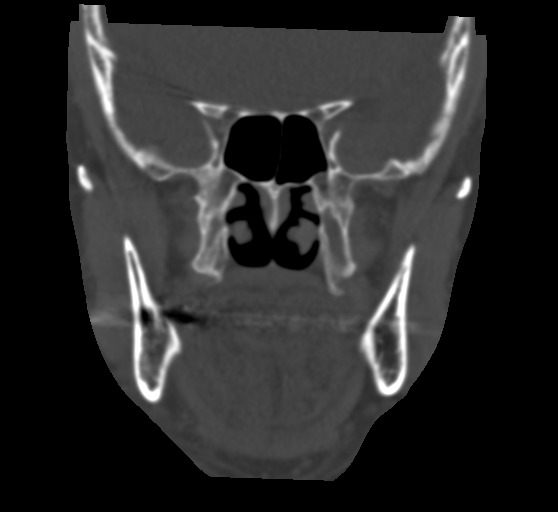
[im 41/74  bone]
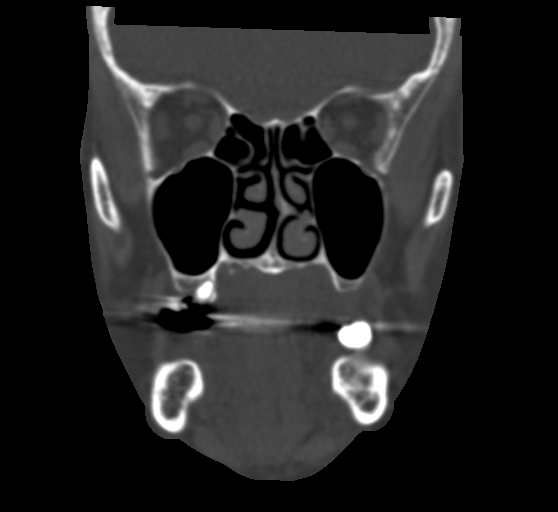

[Series 10: sagittal bone · sagittal · 0.32mm/px · 2 of 78 slices shown]
[im 26/78  bone]
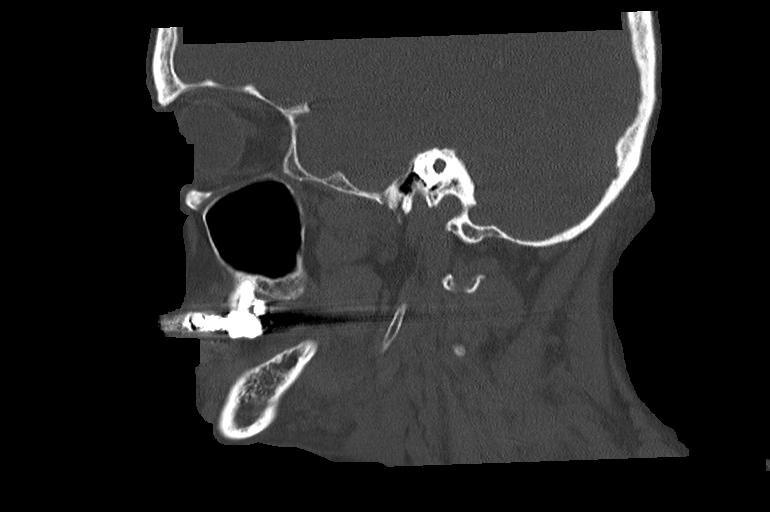
[im 52/78  bone]
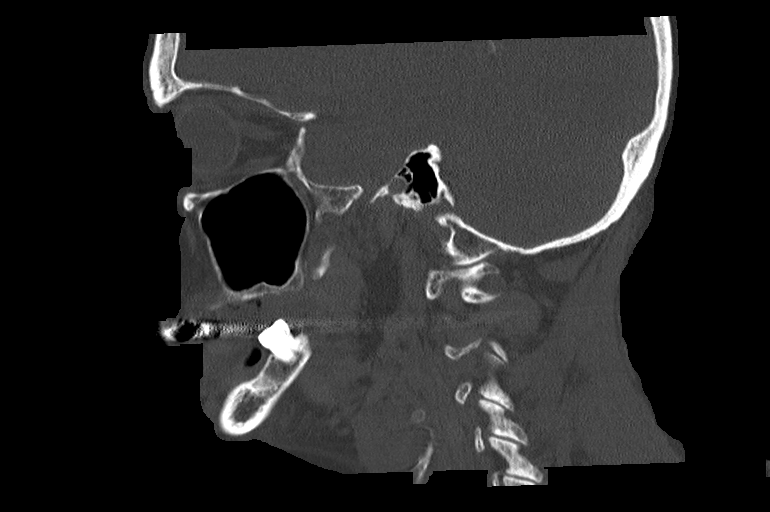

[15 of 47 positions shown; findings below may reference images not displayed]

FINDINGS: Osseous: No fracture or mandibular dislocation. No destructive
process. Degenerative changes of the cervical spine.

Orbits: Negative. No traumatic or inflammatory finding.

Sinuses: Clear.

Soft tissues: Negative. No retropharyngeal fluid collection. No
abnormality deep to the BB marker along the right temple.

Limited intracranial: No significant or unexpected finding.
IMPRESSION: 1. No acute abnormality.
2. No abnormality deep to the BB marker along the right temple.

## 2019-04-20 ENCOUNTER — Other Ambulatory Visit
Admission: RE | Admit: 2019-04-20 | Discharge: 2019-04-20 | Disposition: A | Discharge status: Home / Self Care | Payer: Medicare Other | Source: Ambulatory Visit | Attending: Vascular Surgery | Admitting: Vascular Surgery

## 2019-04-20 ENCOUNTER — Other Ambulatory Visit: Payer: Self-pay

## 2019-04-21 ENCOUNTER — Other Ambulatory Visit (INDEPENDENT_AMBULATORY_CARE_PROVIDER_SITE_OTHER): Payer: Self-pay | Admitting: Nurse Practitioner

## 2019-04-21 ENCOUNTER — Ambulatory Visit: Payer: Medicare Other | Admitting: Nurse Practitioner

## 2019-04-21 LAB — SARS CORONAVIRUS 2 (TAT 6-24 HRS): SARS Coronavirus 2: NEGATIVE

## 2019-04-22 ENCOUNTER — Other Ambulatory Visit: Payer: Self-pay

## 2019-04-22 ENCOUNTER — Inpatient Hospital Stay: Payer: Medicare Other

## 2019-04-22 ENCOUNTER — Inpatient Hospital Stay
Admission: AD | Admit: 2019-04-22 | Discharge: 2019-04-23 | DRG: 036 | Disposition: A | Discharge status: Home / Self Care | Payer: Medicare Other | Attending: Vascular Surgery | Admitting: Vascular Surgery

## 2019-04-22 ENCOUNTER — Encounter
Admission: AD | Disposition: A | Discharge status: Home / Self Care | Payer: Self-pay | Source: Home / Self Care | Attending: Vascular Surgery

## 2019-04-22 DIAGNOSIS — R1084 Generalized abdominal pain: Secondary | ICD-10-CM

## 2019-04-22 DIAGNOSIS — K5790 Diverticulosis of intestine, part unspecified, without perforation or abscess without bleeding: Secondary | ICD-10-CM | POA: Diagnosis present

## 2019-04-22 DIAGNOSIS — Z87891 Personal history of nicotine dependence: Secondary | ICD-10-CM

## 2019-04-22 DIAGNOSIS — I10 Essential (primary) hypertension: Secondary | ICD-10-CM | POA: Diagnosis present

## 2019-04-22 DIAGNOSIS — Z20828 Contact with and (suspected) exposure to other viral communicable diseases: Secondary | ICD-10-CM | POA: Diagnosis present

## 2019-04-22 DIAGNOSIS — E782 Mixed hyperlipidemia: Secondary | ICD-10-CM | POA: Diagnosis present

## 2019-04-22 DIAGNOSIS — Z880 Allergy status to penicillin: Secondary | ICD-10-CM

## 2019-04-22 DIAGNOSIS — Z885 Allergy status to narcotic agent status: Secondary | ICD-10-CM | POA: Diagnosis not present

## 2019-04-22 DIAGNOSIS — I6522 Occlusion and stenosis of left carotid artery: Principal | ICD-10-CM | POA: Diagnosis present

## 2019-04-22 HISTORY — PX: CAROTID PTA/STENT INTERVENTION: CATH118231

## 2019-04-22 HISTORY — DX: Disorder of arteries and arterioles, unspecified: I77.9

## 2019-04-22 LAB — GLUCOSE, CAPILLARY
Glucose-Capillary: 100 mg/dL — ABNORMAL HIGH (ref 70–99)
Glucose-Capillary: 126 mg/dL — ABNORMAL HIGH (ref 70–99)

## 2019-04-22 LAB — CREATININE, SERUM
Creatinine, Ser: 0.63 mg/dL (ref 0.44–1.00)
GFR calc Af Amer: >60 mL/min (ref >60)
GFR calc non Af Amer: >60 mL/min (ref >60)

## 2019-04-22 LAB — MRSA PCR SCREENING: MRSA by PCR: NEGATIVE

## 2019-04-22 LAB — BUN: BUN: 18 mg/dL (ref 8–23)

## 2019-04-22 SURGERY — CAROTID PTA/STENT INTERVENTION
Anesthesia: Moderate Sedation | Laterality: Left

## 2019-04-22 MED ORDER — PANTOPRAZOLE SODIUM 40 MG IV SOLR
40.0000 mg | Freq: Every day | INTRAVENOUS | Status: DC
  Administered 2019-04-22: 40 mg via INTRAVENOUS
  Filled 2019-04-22: qty 40

## 2019-04-22 MED ORDER — PHENYLEPHRINE HCL (PRESSORS) 10 MG/ML IV SOLN
INTRAVENOUS | Status: AC
  Filled 2019-04-22: qty 1

## 2019-04-22 MED ORDER — ASPIRIN 81 MG PO CHEW: 81.0000 mg | CHEWABLE_TABLET | Freq: Every day | ORAL | Status: DC

## 2019-04-22 MED ORDER — ACETAMINOPHEN 325 MG PO TABS: 325.0000 mg | ORAL_TABLET | ORAL | Status: DC | PRN

## 2019-04-22 MED ORDER — DOCUSATE SODIUM 100 MG PO CAPS
100.0000 mg | ORAL_CAPSULE | Freq: Every day | ORAL | Status: DC
  Administered 2019-04-23: 100 mg via ORAL
  Filled 2019-04-22: qty 1

## 2019-04-22 MED ORDER — FENTANYL CITRATE (PF) 100 MCG/2ML IJ SOLN
INTRAMUSCULAR | Status: AC
  Filled 2019-04-22: qty 2

## 2019-04-22 MED ORDER — ASPIRIN 325 MG PO TABS
325.0000 mg | ORAL_TABLET | ORAL | Status: AC
  Administered 2019-04-22: 325 mg via ORAL

## 2019-04-22 MED ORDER — LABETALOL HCL 5 MG/ML IV SOLN: 10.0000 mg | INTRAVENOUS | Status: DC | PRN

## 2019-04-22 MED ORDER — GUAIFENESIN-DM 100-10 MG/5ML PO SYRP: 15.0000 mL | ORAL_SOLUTION | ORAL | Status: DC | PRN

## 2019-04-22 MED ORDER — ATORVASTATIN CALCIUM 10 MG PO TABS
10.0000 mg | ORAL_TABLET | Freq: Every day | ORAL | Status: DC
  Administered 2019-04-22: 10 mg via ORAL
  Filled 2019-04-22: qty 1

## 2019-04-22 MED ORDER — ASPIRIN EC 81 MG PO TBEC: 81.0000 mg | DELAYED_RELEASE_TABLET | Freq: Every day | ORAL | Status: DC

## 2019-04-22 MED ORDER — DIPHENHYDRAMINE HCL 50 MG/ML IJ SOLN: 50.0000 mg | Freq: Once | INTRAMUSCULAR | Status: DC | PRN

## 2019-04-22 MED ORDER — ATROPINE SULFATE 1 MG/10ML IJ SOSY
PREFILLED_SYRINGE | INTRAMUSCULAR | Status: AC
  Filled 2019-04-22: qty 10

## 2019-04-22 MED ORDER — MIDAZOLAM HCL 5 MG/5ML IJ SOLN
INTRAMUSCULAR | Status: AC
  Filled 2019-04-22: qty 5

## 2019-04-22 MED ORDER — ONDANSETRON HCL 4 MG/2ML IJ SOLN: 4.0000 mg | Freq: Four times a day (QID) | INTRAMUSCULAR | Status: DC | PRN

## 2019-04-22 MED ORDER — HEPARIN SODIUM (PORCINE) 1000 UNIT/ML IJ SOLN
INTRAMUSCULAR | Status: DC | PRN
  Administered 2019-04-22: 2000 [IU] via INTRAVENOUS
  Administered 2019-04-22: 6000 [IU] via INTRAVENOUS

## 2019-04-22 MED ORDER — PHENOL 1.4 % MT LIQD
1.0000 | OROMUCOSAL | Status: DC | PRN
  Filled 2019-04-22: qty 177

## 2019-04-22 MED ORDER — ASPIRIN 81 MG PO CHEW
CHEWABLE_TABLET | ORAL | Status: AC
  Filled 2019-04-22: qty 1

## 2019-04-22 MED ORDER — MORPHINE SULFATE (PF) 4 MG/ML IV SOLN: 2.0000 mg | INTRAVENOUS | Status: DC | PRN

## 2019-04-22 MED ORDER — CLINDAMYCIN PHOSPHATE 300 MG/50ML IV SOLN
300.0000 mg | Freq: Once | INTRAVENOUS | Status: AC
  Administered 2019-04-22: 300 mg via INTRAVENOUS

## 2019-04-22 MED ORDER — ACETAMINOPHEN 325 MG RE SUPP
325.0000 mg | RECTAL | Status: DC | PRN
  Filled 2019-04-22: qty 2

## 2019-04-22 MED ORDER — HYDRALAZINE HCL 20 MG/ML IJ SOLN: 5.0000 mg | INTRAMUSCULAR | Status: DC | PRN

## 2019-04-22 MED ORDER — DOPAMINE-DEXTROSE 3.2-5 MG/ML-% IV SOLN
INTRAVENOUS | Status: AC
  Filled 2019-04-22: qty 250

## 2019-04-22 MED ORDER — ALUM & MAG HYDROXIDE-SIMETH 200-200-20 MG/5ML PO SUSP: 15.0000 mL | ORAL | Status: DC | PRN

## 2019-04-22 MED ORDER — IODIXANOL 320 MG/ML IV SOLN
INTRAVENOUS | Status: DC | PRN
  Administered 2019-04-22: 40 mL via INTRA_ARTERIAL

## 2019-04-22 MED ORDER — CLOPIDOGREL BISULFATE 300 MG PO TABS
300.0000 mg | ORAL_TABLET | ORAL | Status: AC
  Administered 2019-04-22: 300 mg via ORAL

## 2019-04-22 MED ORDER — ASPIRIN EC 81 MG PO TBEC
162.0000 mg | DELAYED_RELEASE_TABLET | ORAL | Status: AC
  Administered 2019-04-22: 162 mg via ORAL

## 2019-04-22 MED ORDER — MIDAZOLAM HCL 2 MG/2ML IJ SOLN
INTRAMUSCULAR | Status: DC | PRN
  Administered 2019-04-22: 0.5 mg via INTRAVENOUS
  Administered 2019-04-22: 2 mg via INTRAVENOUS

## 2019-04-22 MED ORDER — CLINDAMYCIN PHOSPHATE 300 MG/50ML IV SOLN
INTRAVENOUS | Status: AC
  Filled 2019-04-22: qty 50

## 2019-04-22 MED ORDER — FAMOTIDINE 20 MG PO TABS: 40.0000 mg | ORAL_TABLET | Freq: Once | ORAL | Status: DC | PRN

## 2019-04-22 MED ORDER — HEPARIN SODIUM (PORCINE) 1000 UNIT/ML IJ SOLN
INTRAMUSCULAR | Status: AC
  Filled 2019-04-22: qty 1

## 2019-04-22 MED ORDER — ASPIRIN EC 81 MG PO TBEC
DELAYED_RELEASE_TABLET | ORAL | Status: AC
  Filled 2019-04-22: qty 2

## 2019-04-22 MED ORDER — ASPIRIN EC 81 MG PO TBEC
81.0000 mg | DELAYED_RELEASE_TABLET | Freq: Every day | ORAL | Status: DC
  Administered 2019-04-23: 81 mg via ORAL
  Filled 2019-04-22: qty 1

## 2019-04-22 MED ORDER — MIDAZOLAM HCL 2 MG/ML PO SYRP: 8.0000 mg | ORAL_SOLUTION | Freq: Once | ORAL | Status: DC | PRN

## 2019-04-22 MED ORDER — SODIUM CHLORIDE 0.9 % IV SOLN: 500.0000 mL | Freq: Once | INTRAVENOUS | Status: DC | PRN

## 2019-04-22 MED ORDER — FENTANYL CITRATE (PF) 100 MCG/2ML IJ SOLN: 12.5000 ug | Freq: Once | INTRAMUSCULAR | Status: DC | PRN

## 2019-04-22 MED ORDER — CLOPIDOGREL BISULFATE 75 MG PO TABS
75.0000 mg | ORAL_TABLET | Freq: Every day | ORAL | Status: DC
  Administered 2019-04-23: 75 mg via ORAL
  Filled 2019-04-22: qty 1

## 2019-04-22 MED ORDER — METHYLPREDNISOLONE SODIUM SUCC 125 MG IJ SOLR: 125.0000 mg | Freq: Once | INTRAMUSCULAR | Status: DC | PRN

## 2019-04-22 MED ORDER — CLOPIDOGREL BISULFATE 75 MG PO TABS
ORAL_TABLET | ORAL | Status: AC
  Filled 2019-04-22: qty 4

## 2019-04-22 MED ORDER — FENTANYL CITRATE (PF) 100 MCG/2ML IJ SOLN
INTRAMUSCULAR | Status: DC | PRN
  Administered 2019-04-22: 50 ug via INTRAVENOUS
  Administered 2019-04-22: 25 ug via INTRAVENOUS

## 2019-04-22 MED ORDER — TRAMADOL HCL 50 MG PO TABS: 50.0000 mg | ORAL_TABLET | Freq: Four times a day (QID) | ORAL | Status: DC | PRN

## 2019-04-22 MED ORDER — SODIUM CHLORIDE 0.9 % IV SOLN
INTRAVENOUS | Status: DC
  Administered 2019-04-22: 12:00:00 via INTRAVENOUS

## 2019-04-22 MED ORDER — ATROPINE SULFATE 1 MG/10ML IJ SOSY
PREFILLED_SYRINGE | INTRAMUSCULAR | Status: DC | PRN
  Administered 2019-04-22: 1 mg via INTRAVENOUS

## 2019-04-22 MED ORDER — CLINDAMYCIN PHOSPHATE 300 MG/50ML IV SOLN
300.0000 mg | Freq: Three times a day (TID) | INTRAVENOUS | Status: AC
  Administered 2019-04-22 – 2019-04-23 (×2): 300 mg via INTRAVENOUS
  Filled 2019-04-22 (×2): qty 50

## 2019-04-22 MED ORDER — AMLODIPINE BESYLATE 5 MG PO TABS
5.0000 mg | ORAL_TABLET | Freq: Every day | ORAL | Status: DC
  Administered 2019-04-23: 5 mg via ORAL
  Filled 2019-04-22: qty 1

## 2019-04-22 MED ORDER — ESMOLOL HCL-SODIUM CHLORIDE 2000 MG/100ML IV SOLN
INTRAVENOUS | Status: AC
  Filled 2019-04-22: qty 100

## 2019-04-22 MED ORDER — METOPROLOL TARTRATE 5 MG/5ML IV SOLN: 2.0000 mg | INTRAVENOUS | Status: DC | PRN

## 2019-04-22 MED ORDER — KCL IN DEXTROSE-NACL 20-5-0.9 MEQ/L-%-% IV SOLN
INTRAVENOUS | Status: DC
  Administered 2019-04-22: 18:00:00 via INTRAVENOUS
  Filled 2019-04-22 (×4): qty 1000

## 2019-04-22 SURGICAL SUPPLY — 21 items
BALLN VTRAC 4.5X20X135 (BALLOONS) ×2
BALLOON VTRAC 4.5X20X135 (BALLOONS) IMPLANT
CATH ANGIO 5F 100CM .035 PIG (CATHETERS) ×1 IMPLANT
CATH BEACON 5 .035 100 H1 TIP (CATHETERS) ×1 IMPLANT
DEVICE EMBOSHIELD NAV6 4.0-7.0 (FILTER) ×1 IMPLANT
DEVICE PRESTO INFLATION (MISCELLANEOUS) ×2 IMPLANT
DEVICE STARCLOSE SE CLOSURE (Vascular Products) ×1 IMPLANT
DEVICE TORQUE .025-.038 (MISCELLANEOUS) ×1 IMPLANT
GLIDEWIRE ANGLED SS 035X260CM (WIRE) ×1 IMPLANT
KIT CAROTID MANIFOLD (MISCELLANEOUS) ×1 IMPLANT
NDL ENTRY 21GA 7CM ECHOTIP (NEEDLE) IMPLANT
NEEDLE ENTRY 21GA 7CM ECHOTIP (NEEDLE) ×2 IMPLANT
PACK ANGIOGRAPHY (CUSTOM PROCEDURE TRAY) ×1 IMPLANT
SET INTRO CAPELLA COAXIAL (SET/KITS/TRAYS/PACK) ×1 IMPLANT
SHEATH BRITE TIP 5FRX11 (SHEATH) ×1 IMPLANT
SHEATH SHUTTLE 6FR (SHEATH) ×1 IMPLANT
STENT XACT CAR 9-7X40X136 (Permanent Stent) ×1 IMPLANT
SYR MEDRAD MARK 7 150ML (SYRINGE) ×1 IMPLANT
TUBING CONTRAST HIGH PRESS 72 (TUBING) ×1 IMPLANT
WIRE G VAS 035X260 STIFF (WIRE) ×1 IMPLANT
WIRE J 3MM .035X145CM (WIRE) ×1 IMPLANT

## 2019-04-22 NOTE — Progress Notes (Signed)
Pt with odd behavior on arrival to unit, asked "when will I be able to move my right arm again?"  Upon thorough assessment her rt arm and leg were completely flaccid, she could not follow any motor commands with her right side, rt side of mouth drooping and her speech was slurred.  Obtained order for stat CT of head.  Reentered room to inform her and found her symptoms mostly resolved, rt side strong, no droop, speech ok, although she continued with inappropriate answers to my questions and instead of squeezing my hand she might place her palm near my face or other odd behavior.  CT negative, Dr Delana Meyer ordered a second dose of ASA and we will monitor, call Dr Gwenyth Allegra with any changes and pt may need aggrastat infusion

## 2019-04-22 NOTE — Progress Notes (Signed)
Called to see patient regarding some global weakness and confusion.  This episode seemed to resolve quickly spontaneously.  It did not appear to be associated with hypotension.  By my assessment the patient's neurological status is normal she is not exhibiting any focal motor deficit.  I did obtain a CT noncontrast which I reviewed personally there is no bleed.  Given that we are back to baseline I do not feel that further imaging is indicated this appears to have been a brief more global event not consistent with a focal TIA.  I will continue her antiplatelet therapy.  Should we observe another similar episode then I would advocate for starting parenteral antiplatelet therapy.

## 2019-04-22 NOTE — Op Note (Signed)
OPERATIVE NOTE DATE: 04/22/2019  PROCEDURE: 1.  Ultrasound guidance for vascular access right femoral artery 2.  Placement of a 9 x 7 x 40 exact stent with the use of the NAV-6 embolic protection device in the left carotid artery  PRE-OPERATIVE DIAGNOSIS: 1.  Critical stenosis left internal carotid artery. 2.  TIA  POST-OPERATIVE DIAGNOSIS:  Same as above  SURGEON: Hortencia Pilar  ASSISTANT(S): Dr. Corene Cornea dew  ANESTHESIA: local/MCS  ESTIMATED BLOOD LOSS: 25 cc  CONTRAST: 40 cc  FLUORO TIME: 8.1 minutes  MODERATE CONSCIOUS SEDATION TIME: Continuous ECG pulse oximetry and cardiopulmonary monitoring was performed throughout the entire procedure by the interventional radiology nurse total sedation time was 50 minutes  FINDING(S): 1.   90% left internal carotid artery stenosis  SPECIMEN(S):   none  INDICATIONS:   Patient is a 78 y.o. female who presents with 90% left internal carotid artery stenosis.  The patient has a history of a TIA with full recovery as well as a high bifurcation making her surgically more complicated as well as a history of coronary artery disease and carotid artery stenting was felt to be preferred to endarterectomy for that reason.  Risks and benefits were discussed and informed consent was obtained.   DESCRIPTION: After obtaining full informed written consent, the patient was brought back to the vascular suite and placed supine upon the table.  The patient received IV antibiotics prior to induction. Moderate conscious sedation was administered during a face to face encounter with the patient throughout the procedure with my supervision of the RN administering medicines and monitoring the patients vital signs and mental status throughout from the start of the procedure until the patient was taken to the recovery room.    After obtaining adequate sedation, the patient was prepped and draped in the standard fashion.    A first assistant is required in  order to allow for a safe and more efficient operation.  Duties include wire manipulations as well as assistance with pinning the sheath and positioning the detector for proper angle, assistance and deploying the stent in the proper position and appropriate images.  Further duties include assisting with patient positioning during the procedure.  I believe that this procedure requires a first assistant in order for it to be performed at a level in keeping with the high standards of this institution.  The right femoral artery was visualized with ultrasound and found to be widely patent. It was then accessed under direct ultrasound guidance without difficulty with a micropuncture needle. A permanent image was recorded.  A microwire was then advanced without difficulty under fluoroscopic guidance followed by a micro-sheath.  A J-wire was placed and we then placed a 6 French sheath. The patient was then heparinized and a total of 8000 units of intravenous heparin were given and an ACT was checked to confirm successful anticoagulation.   A pigtail catheter was then placed into the ascending aorta. This showed a type I arch origins of the great vessels are widely patent. The left common carotid artery was then selectively cannulated without difficulty with a H1 catheter and the catheter advanced into the mid left common carotid artery.  Cervical and cerebral carotid angiography was then performed. There were no obvious intracranial filling defects. The carotid bifurcation demonstrated 90% stenosis at the origin of the left internal carotid artery.  I then advanced into the external carotid artery with a Glidewire and the H1 catheter and then exchanged for the Amplatz Super Stiff wire.  Over the Amplatz Super Stiff wire, a 6 [place] shuttle sheath was placed into the mid common carotid artery. I then used the NAV-6  Embolic protection device and crossed the lesion and parked this in the distal internal carotid artery at  the base of the skull.  I then selected a 9 x 7 x 40 exact stent. This was deployed across the lesion encompassing it in its entirety. A 4.5 mm x 20 mm length balloon was used to post dilate the stent. Only about a 20% residual stenosis was present after angioplasty. Completion angiogram showed normal intracranial filling without new defects. At this point I elected to terminate the procedure. The sheath was removed and StarClose closure device was deployed in the right femoral artery with excellent hemostatic result. The patient was taken to the recovery room in stable condition having tolerated the procedure well.  COMPLICATIONS: none  CONDITION: stable  Hortencia Pilar 04/22/2019 2:55 PM   This note was created with Dragon Medical transcription system. Any errors in dictation are purely unintentional.

## 2019-04-22 NOTE — H&P (Signed)
[facility] History & Physical Update  The patient was interviewed and re-examined.  The patient's previous History and Physical has been reviewed and is unchanged.  There is no change in the plan of care. We plan to proceed with the scheduled procedure.  Hortencia Pilar, MD  04/22/2019, 1:37 PM

## 2019-04-22 NOTE — Progress Notes (Signed)
Cross necklace and pear earrings given to the son at bedside to be taken home 04/22/2019 1600.  Mattie Marlin, RN

## 2019-04-22 NOTE — Progress Notes (Signed)
[name] earrings, white studs, removed during procedure and given to recovery nurse at handoff.

## 2019-04-23 ENCOUNTER — Encounter: Payer: Self-pay | Admitting: Vascular Surgery

## 2019-04-23 DIAGNOSIS — R1084 Generalized abdominal pain: Secondary | ICD-10-CM

## 2019-04-23 DIAGNOSIS — I6522 Occlusion and stenosis of left carotid artery: Principal | ICD-10-CM

## 2019-04-23 LAB — BASIC METABOLIC PANEL
Anion gap: 6 (ref 5–15)
BUN: 16 mg/dL (ref 8–23)
CO2: 26 mmol/L (ref 22–32)
Calcium: 8.6 mg/dL — ABNORMAL LOW (ref 8.9–10.3)
Chloride: 108 mmol/L (ref 98–111)
Creatinine, Ser: 0.51 mg/dL (ref 0.44–1.00)
GFR calc Af Amer: >60 mL/min (ref >60)
GFR calc non Af Amer: >60 mL/min (ref >60)
Glucose, Bld: 106 mg/dL — ABNORMAL HIGH (ref 70–99)
Potassium: 4.2 mmol/L (ref 3.5–5.1)
Sodium: 140 mmol/L (ref 135–145)

## 2019-04-23 LAB — CBC
HCT: 34.8 % — ABNORMAL LOW (ref 36.0–46.0)
Hemoglobin: 11.6 g/dL — ABNORMAL LOW (ref 12.0–15.0)
MCH: 30.6 pg (ref 26.0–34.0)
MCHC: 33.3 g/dL (ref 30.0–36.0)
MCV: 91.8 fL (ref 80.0–100.0)
Platelets: 333 10*3/uL (ref 150–400)
RBC: 3.79 MIL/uL — ABNORMAL LOW (ref 3.87–5.11)
RDW: 12.9 % (ref 11.5–15.5)
WBC: 8.7 10*3/uL (ref 4.0–10.5)
nRBC: 0 % (ref 0.0–0.2)

## 2019-04-23 LAB — POCT ACTIVATED CLOTTING TIME: Activated Clotting Time: 263 seconds

## 2019-04-23 MED ORDER — CLOPIDOGREL BISULFATE 75 MG PO TABS: 75.0000 mg | ORAL_TABLET | Freq: Every day | ORAL | 3 refills | Status: DC

## 2019-04-23 MED ORDER — ASPIRIN 81 MG PO TBEC: 81.0000 mg | DELAYED_RELEASE_TABLET | Freq: Every day | ORAL | Status: AC

## 2019-04-23 MED ORDER — CHLORHEXIDINE GLUCONATE CLOTH 2 % EX PADS: 6.0000 | MEDICATED_PAD | Freq: Every day | CUTANEOUS | Status: DC

## 2019-04-23 NOTE — Progress Notes (Signed)
Discharge instructions given with verbalized understanding.  Vital signs stable.  Patient taken to medical mall entrance via wheelchair to be taken home in personal vehicle by family member.  Mask in place.

## 2019-04-23 NOTE — Discharge Summary (Signed)
[facility]    Discharge Summary  Patient ID:  Tina Hardy MRN: [id number] DOB/AGE: [birth year] 76 y.o.  Admit date: 04/22/2019 Discharge date: 04/23/2019 Date of Surgery: 04/22/2019 Surgeon: Surgeon(s): Schnier, Dolores Lory, MD Algernon Huxley, MD  Admission Diagnosis: Carotid stenosis, symptomatic w/o infarct, left [I65.22]  Discharge Diagnoses:  Carotid stenosis, symptomatic w/o infarct, left [I65.22]  Secondary Diagnoses: Past Medical History:  Diagnosis Date  . Carotid arterial disease ([place])   . Colon polyp 07/18/2004   Hyperplastic  . Diverticulosis   . Hepatic hemangioma 07/16/2012  . Hypertension   . Liver hemangioma   . Pernicious anemia   . Rectal bleed    Procedure(s): CAROTID PTA/STENT INTERVENTION (LEFT)  Discharged Condition: good  HPI / Hospital Course:  Tina Hardy is a 78 y.o. female is S/P Left:  Procedure(s): CAROTID PTA/STENT INTERVENTION  Patient is a 78 y.o. female who presents with 90% left internal carotid artery stenosis.  The patient has a history of a TIA with full recovery as well as a high bifurcation making her surgically more complicated as well as a history of coronary artery disease and carotid artery stenting was felt to be preferred to endarterectomy for that reason.  Risks and benefits were discussed and informed consent was obtained. On 04/22/19, the patient underwent:  1.  Ultrasound guidance for vascular access right femoral artery 2.  Placement of a 9 x 7 x 40 exact stent with the use of the NAV-6 embolic protection device in the left carotid artery  She tolerated the procedure well was transferred from the recovery room to the ICU for observation overnight.  Patient with one brief episode of confusion and possible right-sided weakness.  Stat CTA of the head was negative.  The symptoms quickly resolved and no other episodes occurred while inpatient.  During the patient's brief inpatient stay, her diet  was advanced, she was urinating independently, her discomfort was controlled to the use of p.o. pain medication and she was ambulating at baseline.  Upon discharge, the patient was afebrile with stable vital signs making adequate urine with an unremarkable physical exam.  Physical exam:  A&Ox3, NAD Face: Symmetrical.  Tongue is midline. Neck: Trachea midline.  No swelling or ecchymosis noted. Heart: Regular rate and rhythm. Pulm: Clear to auscultation bilaterally. Abdomen: Soft, nontender, nondistended positive bowel sounds Right Groin:  Access site - clean dry intact.  No swelling or ecchymosis noted. Vascular: Extremities are warm distally.  Minimal edema. Neuro: 5/5 upper / lower extremities.  No neurological deficits noted on exam.  Labs as below  Complications: None  Consults: None  Significant Diagnostic Studies: CBC Lab Results  Component Value Date   WBC 8.7 04/23/2019   HGB 11.6 (L) 04/23/2019   HCT 34.8 (L) 04/23/2019   MCV 91.8 04/23/2019   PLT 333 04/23/2019   BMET    Component Value Date/Time   NA 140 04/23/2019 0455   K 4.2 04/23/2019 0455   CL 108 04/23/2019 0455   CO2 26 04/23/2019 0455   GLUCOSE 106 (H) 04/23/2019 0455   BUN 16 04/23/2019 0455   CREATININE 0.51 04/23/2019 0455   CALCIUM 8.6 (L) 04/23/2019 0455   GFRNONAA >60 04/23/2019 0455   GFRAA >60 04/23/2019 0455   COAG Lab Results  Component Value Date   INR 1.0 02/16/2019   Disposition:  Discharge to :Home  Allergies as of 04/23/2019      Reactions   Codeine Other (See Comments)   Unknown reaction  Penicillins Other (See Comments)   Unknown reaction  Has patient had a PCN reaction causing immediate rash, facial/tongue/throat swelling, SOB or lightheadedness with hypotension: n/a Has patient had a PCN reaction causing severe rash involving mucus membranes or skin necrosis: n/a Has patient had a PCN reaction that required hospitalization: n/a Has patient had a PCN reaction occurring  within the last 10 years: n/a If all of the above answers are "NO", then may proceed with Cephalosporin use.      Medication List    STOP taking these medications   aspirin 81 MG chewable tablet Commonly known as: Aspirin Childrens Replaced by: aspirin 81 MG EC tablet     TAKE these medications   ADULT GUMMY PO Take 2 tablets by mouth daily.   amLODipine 5 MG tablet Commonly known as: NORVASC Take 1 tablet (5 mg total) by mouth daily.   aspirin 81 MG EC tablet Take 1 tablet (81 mg total) by mouth daily. Replaces: aspirin 81 MG chewable tablet   atorvastatin 10 MG tablet Commonly known as: Lipitor Take 1 tablet (10 mg total) by mouth daily.   clopidogrel 75 MG tablet Commonly known as: PLAVIX Take 1 tablet (75 mg total) by mouth daily at 6 (six) AM.   VITAMIN B-12 PO Take 1 drop by mouth daily as needed (ENERGY SUPPORT).      Verbal and written Discharge instructions given to the patient. Wound care per Discharge AVS Follow-up Information    Schnier, Dolores Lory, MD Follow up in 3 week(s).   Specialties: Vascular Surgery, Cardiology, Radiology, Vascular Surgery Why: Can see Arna Medici. First post-op. Will need carotid duplex with visit.  Contact information: [place] [address] [phone]          Signed: Sela Hua, PA-C  04/23/2019, 10:07 AM

## 2019-04-23 NOTE — Discharge Instructions (Signed)
Vascular Surgery Discharge Instructions 1) You may remove your dressing and shower as of tomorrow  2) No heavy lifting >10 pounds of strenuous activity until cleared during your first post-op visit.  3) No driving until cleared during your first post-op visit.

## 2019-04-24 ENCOUNTER — Other Ambulatory Visit: Payer: Self-pay

## 2019-04-24 ENCOUNTER — Encounter: Payer: Self-pay | Admitting: Nurse Practitioner

## 2019-04-24 ENCOUNTER — Ambulatory Visit (INDEPENDENT_AMBULATORY_CARE_PROVIDER_SITE_OTHER): Payer: Medicare Other | Admitting: Nurse Practitioner

## 2019-04-24 VITALS — BP 134/76 | HR 93 | Temp 98.3°F

## 2019-04-24 DIAGNOSIS — F1290 Cannabis use, unspecified, uncomplicated: Secondary | ICD-10-CM | POA: Diagnosis not present

## 2019-04-24 DIAGNOSIS — I358 Other nonrheumatic aortic valve disorders: Secondary | ICD-10-CM | POA: Insufficient documentation

## 2019-04-24 DIAGNOSIS — F418 Other specified anxiety disorders: Secondary | ICD-10-CM | POA: Diagnosis not present

## 2019-04-24 MED ORDER — BUSPIRONE HCL 5 MG PO TABS: 5.0000 mg | ORAL_TABLET | Freq: Two times a day (BID) | ORAL | 3 refills | Status: DC

## 2019-04-24 NOTE — Patient Instructions (Signed)

## 2019-04-24 NOTE — Assessment & Plan Note (Signed)
Ongoing and increased with recent stressors. Denies SI/HI.  Will trial Buspar 5 MG BID, script sent.  Would avoid benzo use due to age.  Could trial Sertraline daily if ongoing issues or increase dose Buspar.  Return in 4 weeks for follow-up.

## 2019-04-24 NOTE — Assessment & Plan Note (Signed)
Reports occasional use.  Recommend cutting back on this.  Will start Buspar and see if this assists in decreased MJ use.

## 2019-04-24 NOTE — Progress Notes (Signed)
BP 134/76   Pulse 93   Temp 98.3 F (36.8 C) (Oral)   SpO2 98%    Subjective:    Patient ID: Tina Hardy, female    DOB: 07/23/1940, 78 y.o.   MRN: [id number]  HPI: Tina Hardy is a 78 y.o. female  Chief Complaint  Patient presents with  . Anxiety   ANXIETY/STRESS Reports she feels she needs something to help her so she does not need to "smoke so many doobies".  Reports some mild anxiety at baseline. Has taken Ambien in past for sleep, but not taken this in a long while.  Discussed option of Buspar with her, she can determine if more benefit from taking daily or as needed.  Educated on medication and side effects.  States she wants something "stron", discussed with her this is safe medication for her age group and would place less risk then benzo, discussed BEERS criteria with her.  She agrees to trial of this. Duration:exacerbated Anxious mood: yes  Excessive worrying: sometimes Irritability: no  Sweating: no Nausea: no Palpitations:no Hyperventilation: no Panic attacks: no Agoraphobia: no  Obscessions/compulsions: no Depressed mood: no Depression screen [facility] 2/9 03/23/2019 09/25/2017  Decreased Interest 0 0  Down, Depressed, Hopeless 0 1  PHQ - 2 Score 0 1  Altered sleeping 1 -  Tired, decreased energy 0 -  Change in appetite 0 -  Feeling bad or failure about yourself  0 -  Trouble concentrating 0 -  Moving slowly or fidgety/restless 0 -  Suicidal thoughts 0 -  PHQ-9 Score 1 -  Difficult doing work/chores Not difficult at all -   Anhedonia: no Weight changes: no Insomnia: yes hard to fall asleep  Hypersomnia: no Fatigue/loss of energy: no Feelings of worthlessness: no Feelings of guilt: no Impaired concentration/indecisiveness: sometimes Suicidal ideations: no  Crying spells: no Recent Stressors/Life Changes: yes   Relationship problems: no   Family stress: no     Financial stress: yes, due to family taking all of her money   Job stress: no   Recent death/loss: no GAD 7 : Generalized Anxiety Score 04/24/2019 03/23/2019  Nervous, Anxious, on Edge 1 0  Control/stop worrying 0 0  Worry too much - different things 0 0  Trouble relaxing 1 0  Restless 1 0  Easily annoyed or irritable 0 0  Afraid - awful might happen 0 0  Total GAD 7 Score 3 0  Anxiety Difficulty Not difficult at all -    Relevant past medical, surgical, family and social history reviewed and updated as indicated. Interim medical history since our last visit reviewed. Allergies and medications reviewed and updated.  Review of Systems  Constitutional: Negative for activity change, appetite change, diaphoresis, fatigue and fever.  Respiratory: Negative for cough, chest tightness and shortness of breath.   Cardiovascular: Negative for chest pain, palpitations and leg swelling.  Gastrointestinal: Negative for abdominal distention, abdominal pain, constipation, diarrhea, nausea and vomiting.  Neurological: Negative.   Psychiatric/Behavioral: Positive for decreased concentration and sleep disturbance. Negative for self-injury and suicidal ideas. The patient is nervous/anxious.     Per HPI unless specifically indicated above     Objective:    BP 134/76   Pulse 93   Temp 98.3 F (36.8 C) (Oral)   SpO2 98%   Wt Readings from Last 3 Encounters:  04/22/19 150 lb (68 kg)  03/26/19 154 lb (69.9 kg)  03/23/19 155 lb (70.3 kg)    Physical Exam Vitals signs and nursing note reviewed.  Constitutional:      General: She is awake. She is not in acute distress.    Appearance: She is well-developed. She is not ill-appearing.  HENT:     Head: Normocephalic.     Right Ear: Hearing normal.     Left Ear: Hearing normal.  Eyes:     General: Lids are normal.        Right eye: No discharge.        Left eye: No discharge.     Conjunctiva/sclera: Conjunctivae normal.     Pupils: Pupils are equal, round, and reactive to light.  Neck:     Musculoskeletal: Normal range of  motion and neck supple.     Vascular: No carotid bruit.  Cardiovascular:     Rate and Rhythm: Normal rate and regular rhythm.     Heart sounds: Normal heart sounds. No murmur. No gallop.   Pulmonary:     Effort: Pulmonary effort is normal. No accessory muscle usage or respiratory distress.     Breath sounds: Normal breath sounds.  Abdominal:     General: Bowel sounds are normal.     Palpations: Abdomen is soft.  Musculoskeletal:     Right lower leg: No edema.     Left lower leg: No edema.  Skin:    General: Skin is warm and dry.  Neurological:     Mental Status: She is alert and oriented to person, place, and time.  Psychiatric:        Attention and Perception: Attention normal.        Mood and Affect: Mood normal.        Speech: Speech normal.        Behavior: Behavior normal. Behavior is cooperative.        Thought Content: Thought content normal.        Judgment: Judgment normal.     Results for orders placed or performed during the hospital encounter of 04/22/19  MRSA PCR Screening   Specimen: Nasal Mucosa; Nasopharyngeal  Result Value Ref Range   MRSA by PCR NEGATIVE NEGATIVE  BUN  Result Value Ref Range   BUN 18 8 - 23 mg/dL  Creatinine, serum  Result Value Ref Range   Creatinine, Ser 0.63 0.44 - 1.00 mg/dL   GFR calc non Af Amer >60 >60 mL/min   GFR calc Af Amer >60 >60 mL/min  CBC  Result Value Ref Range   WBC 8.7 4.0 - 10.5 K/uL   RBC 3.79 (L) 3.87 - 5.11 MIL/uL   Hemoglobin 11.6 (L) 12.0 - 15.0 g/dL   HCT 34.8 (L) 36.0 - 46.0 %   MCV 91.8 80.0 - 100.0 fL   MCH 30.6 26.0 - 34.0 pg   MCHC 33.3 30.0 - 36.0 g/dL   RDW 12.9 11.5 - 15.5 %   Platelets 333 150 - 400 K/uL   nRBC 0.0 0.0 - 0.2 %  Basic metabolic panel  Result Value Ref Range   Sodium 140 135 - 145 mmol/L   Potassium 4.2 3.5 - 5.1 mmol/L   Chloride 108 98 - 111 mmol/L   CO2 26 22 - 32 mmol/L   Glucose, Bld 106 (H) 70 - 99 mg/dL   BUN 16 8 - 23 mg/dL   Creatinine, Ser 0.51 0.44 - 1.00 mg/dL    Calcium 8.6 (L) 8.9 - 10.3 mg/dL   GFR calc non Af Amer >60 >60 mL/min   GFR calc Af Amer >60 >60 mL/min   Anion gap  6 5 - 15  Glucose, capillary  Result Value Ref Range   Glucose-Capillary 100 (H) 70 - 99 mg/dL  Glucose, capillary  Result Value Ref Range   Glucose-Capillary 126 (H) 70 - 99 mg/dL  POCT Activated clotting time  Result Value Ref Range   Activated Clotting Time 263 seconds      Assessment & Plan:   Problem List Items Addressed This Visit      Other   Marijuana use    Reports occasional use.  Recommend cutting back on this.  Will start Buspar and see if this assists in decreased MJ use.      Situational anxiety - Primary    Ongoing and increased with recent stressors. Denies SI/HI.  Will trial Buspar 5 MG BID, script sent.  Would avoid benzo use due to age.  Could trial Sertraline daily if ongoing issues or increase dose Buspar.  Return in 4 weeks for follow-up.      Relevant Medications   busPIRone (BUSPAR) 5 MG tablet       Follow up plan: Return in about 4 weeks (around 05/22/2019) for Anxiety.

## 2019-04-28 ENCOUNTER — Other Ambulatory Visit: Payer: Self-pay

## 2019-04-28 ENCOUNTER — Encounter: Payer: Self-pay | Admitting: Family Medicine

## 2019-04-28 ENCOUNTER — Ambulatory Visit (INDEPENDENT_AMBULATORY_CARE_PROVIDER_SITE_OTHER): Payer: Medicare Other | Admitting: Family Medicine

## 2019-04-28 ENCOUNTER — Telehealth: Payer: Self-pay | Admitting: Family Medicine

## 2019-04-28 VITALS — Ht 63.0 in | Wt 154.0 lb

## 2019-04-28 DIAGNOSIS — R21 Rash and other nonspecific skin eruption: Secondary | ICD-10-CM | POA: Diagnosis not present

## 2019-04-28 MED ORDER — CLOBETASOL PROPIONATE 0.05 % EX CREA: 1.0000 "application " | TOPICAL_CREAM | Freq: Two times a day (BID) | CUTANEOUS | 0 refills | Status: DC

## 2019-04-28 NOTE — Telephone Encounter (Signed)
Scheduled appt for today virtual per pt

## 2019-04-28 NOTE — Telephone Encounter (Signed)
Needs appointment. OK to be virtual.

## 2019-04-28 NOTE — Progress Notes (Signed)
Ht 5\' 3"  (1.6 m)   Wt 154 lb (69.9 kg)   BMI 27.28 kg/m    Subjective:    Patient ID: Tina Hardy, female    DOB: 1941/01/17, 78 y.o.   MRN: [id number]  HPI: Tina Hardy is a 78 y.o. female  Chief Complaint  Patient presents with  . Rash    abdominal and private area since last night. very itchy, per patient    . This visit was completed via telephone due to the restrictions of the COVID-19 pandemic. All issues as above were discussed and addressed. Physical exam was done as above through visual confirmation on telephone. If it was felt that the patient should be evaluated in the office, they were directed there. The patient verbally consented to this visit. . Location of the patient: home . Location of the provider: home . Those involved with this call:  . Provider: Merrie Roof, PA-C . CMA: Lesle Chris, [place] . Front Desk/Registration: Jill Side  . Time spent on call: 15 minutes on the phone discussing health concerns. 5 minutes total spent in review of patient's record and preparation of their chart. I verified patient identity using two factors (patient name and date of birth). Patient consents verbally to being seen via telemedicine visit today.   Red very itchy rash that patient states popped up last night all over abdomen and private area. Red, not draining or blistered, and no fevers, recent insect bites, outdoor exposures, new soaps or detergents. Thinks she's allergic to one of her medications so has stopped everything as of this morning aside from the baby aspirin. Of note, she has recently been placed on plavix and buspar but it is unclear if she has started either of these yet. Patient states she is confused and keeps changing her mind about whether she went to pick them up and whether she's taken them but she does not think she has started them. Otherwise, typically just taking her amlodipine and lipitor daily which she has been on for a long time without  issue. She did also have a recent surgical procedure last week on her carotid artery and states they put a lot of cleansers on her skin for that.   Relevant past medical, surgical, family and social history reviewed and updated as indicated. Interim medical history since our last visit reviewed. Allergies and medications reviewed and updated.  Review of Systems  Per HPI unless specifically indicated above     Objective:    Ht 5\' 3"  (1.6 m)   Wt 154 lb (69.9 kg)   BMI 27.28 kg/m   Wt Readings from Last 3 Encounters:  04/28/19 154 lb (69.9 kg)  04/22/19 150 lb (68 kg)  03/26/19 154 lb (69.9 kg)    Physical Exam  Unable to perform PE today due to patient lack of access to video technology for visit  Results for orders placed or performed during the hospital encounter of 04/22/19  MRSA PCR Screening   Specimen: Nasal Mucosa; Nasopharyngeal  Result Value Ref Range   MRSA by PCR NEGATIVE NEGATIVE  BUN  Result Value Ref Range   BUN 18 8 - 23 mg/dL  Creatinine, serum  Result Value Ref Range   Creatinine, Ser 0.63 0.44 - 1.00 mg/dL   GFR calc non Af Amer >60 >60 mL/min   GFR calc Af Amer >60 >60 mL/min  CBC  Result Value Ref Range   WBC 8.7 4.0 - 10.5 K/uL   RBC 3.79 (L) 3.87 -  5.11 MIL/uL   Hemoglobin 11.6 (L) 12.0 - 15.0 g/dL   HCT 34.8 (L) 36.0 - 46.0 %   MCV 91.8 80.0 - 100.0 fL   MCH 30.6 26.0 - 34.0 pg   MCHC 33.3 30.0 - 36.0 g/dL   RDW 12.9 11.5 - 15.5 %   Platelets 333 150 - 400 K/uL   nRBC 0.0 0.0 - 0.2 %  Basic metabolic panel  Result Value Ref Range   Sodium 140 135 - 145 mmol/L   Potassium 4.2 3.5 - 5.1 mmol/L   Chloride 108 98 - 111 mmol/L   CO2 26 22 - 32 mmol/L   Glucose, Bld 106 (H) 70 - 99 mg/dL   BUN 16 8 - 23 mg/dL   Creatinine, Ser 0.51 0.44 - 1.00 mg/dL   Calcium 8.6 (L) 8.9 - 10.3 mg/dL   GFR calc non Af Amer >60 >60 mL/min   GFR calc Af Amer >60 >60 mL/min   Anion gap 6 5 - 15  Glucose, capillary  Result Value Ref Range    Glucose-Capillary 100 (H) 70 - 99 mg/dL  Glucose, capillary  Result Value Ref Range   Glucose-Capillary 126 (H) 70 - 99 mg/dL  POCT Activated clotting time  Result Value Ref Range   Activated Clotting Time 263 seconds      Assessment & Plan:   Problem List Items Addressed This Visit    None     Unclear if allergy may be from a surgical prep, medication, or other allergen/irritant. Unfortunately patient is unsure if she's every started the two new medicines so it's impossible to say at this time if one of those is the culprit. Will take antihistamines 1-2 times daily, start clobetasol cream BID prn, and f/u with PCP in 1-2 weeks for recheck and med reconciliation.   Follow up plan: No follow-ups on file.

## 2019-04-28 NOTE — Telephone Encounter (Signed)
Copied from [place] [id number]. Topic: General - Inquiry >> Apr 28, 2019  8:30 AM Richardo Priest, NT wrote: Reason for CRM: Pt called in stating she woke up with a rash all over her back and stomach. Pt could not sleep at all last night. Pt did not want an appointment but would like a medication called in to her local pharmacy so it will take away the itch and pain coming from the rash. Please advise.

## 2019-04-30 ENCOUNTER — Telehealth (INDEPENDENT_AMBULATORY_CARE_PROVIDER_SITE_OTHER): Payer: Self-pay

## 2019-04-30 NOTE — Telephone Encounter (Signed)
Dr Delana Meyer has been made aware with the allergic reaction the patient is having and recommend for the patient to take Benadryl every 6 hours as needed start with 25mg  tablet because its causes drowsiness. I left a message on the patient son voicemail and informed the patient with medical advice.

## 2019-04-30 NOTE — Telephone Encounter (Signed)
Patient left a message stating that she is having allergic reaction with hives from the breast to vagina area. The patient stated that the reaction started after having her procedure on 04/22/2019. I ask the patient a few questions but she was not able to answer them she stated she preferred for me to speak with her son Tina Hardy. I called her son per patient request and left a message for him to return a call back to the office .

## 2019-05-11 ENCOUNTER — Encounter: Payer: Self-pay | Admitting: Nurse Practitioner

## 2019-05-11 ENCOUNTER — Telehealth: Payer: Self-pay | Admitting: Nurse Practitioner

## 2019-05-11 NOTE — Telephone Encounter (Signed)
Called pt to schedule 2 week f/u with [name] only per Apolonio Schneiders. No answer, left vm, sending letter.

## 2019-05-18 NOTE — Telephone Encounter (Addendum)
Pt has an appt schedule for 05-26-2019

## 2019-05-26 ENCOUNTER — Encounter: Payer: Self-pay | Admitting: Nurse Practitioner

## 2019-05-26 ENCOUNTER — Telehealth: Payer: Self-pay | Admitting: Nurse Practitioner

## 2019-05-26 ENCOUNTER — Ambulatory Visit (INDEPENDENT_AMBULATORY_CARE_PROVIDER_SITE_OTHER): Payer: Medicare Other | Admitting: Nurse Practitioner

## 2019-05-26 ENCOUNTER — Other Ambulatory Visit: Payer: Self-pay

## 2019-05-26 VITALS — BP 132/68 | HR 84 | Temp 98.0°F

## 2019-05-26 DIAGNOSIS — I10 Essential (primary) hypertension: Secondary | ICD-10-CM

## 2019-05-26 DIAGNOSIS — F418 Other specified anxiety disorders: Secondary | ICD-10-CM

## 2019-05-26 DIAGNOSIS — R7309 Other abnormal glucose: Secondary | ICD-10-CM | POA: Diagnosis not present

## 2019-05-26 DIAGNOSIS — E782 Mixed hyperlipidemia: Secondary | ICD-10-CM

## 2019-05-26 DIAGNOSIS — F5104 Psychophysiologic insomnia: Secondary | ICD-10-CM

## 2019-05-26 DIAGNOSIS — F1290 Cannabis use, unspecified, uncomplicated: Secondary | ICD-10-CM

## 2019-05-26 NOTE — Telephone Encounter (Signed)
Pt is here stating she wants to apply for a disabled placard for car . Please advise.

## 2019-05-26 NOTE — Addendum Note (Signed)
Addended by: Marnee Guarneri T on: 05/26/2019 07:17 PM   Modules accepted: Orders

## 2019-05-26 NOTE — Assessment & Plan Note (Signed)
Chronic, ongoing.  Continue current medication regimen and adjust as needed. Lipid panel today. 

## 2019-05-26 NOTE — Telephone Encounter (Signed)
Please alert patient she would need to bring in form to fill out for this from [facility] or pick for up in office to fill out then bring back to me to sign.

## 2019-05-26 NOTE — Assessment & Plan Note (Addendum)
Chronic, stable with BP at goal on recheck.  Continue current medication regimen and adjust as needed.  CMP today.  Recommend checking BP at home regularly.  Return in 6 months.

## 2019-05-26 NOTE — Progress Notes (Signed)
BP 132/68 (BP Location: Left Arm, Patient Position: Sitting)   Pulse 84   Temp 98 F (36.7 C) (Oral)   SpO2 97%    Subjective:    Patient ID: Tina Hardy, female    DOB: 1941/01/16, 79 y.o.   MRN: [id number]  HPI: Tina Hardy is a 79 y.o. female  Chief Complaint  Patient presents with  . Anxiety    follow up   ANXIETY/STRESS Not taking Buspirone.  Continues to eat "edible" in morning, which brings her "peace and comfort".   Duration:stable Anxious mood: no  Excessive worrying: no Irritability: no  Sweating: no Nausea: no Palpitations:no Hyperventilation: no Panic attacks: no Agoraphobia: no  Obscessions/compulsions: no Depressed mood: no Depression screen [facility] 2/9 03/23/2019 09/25/2017  Decreased Interest 0 0  Down, Depressed, Hopeless 0 1  PHQ - 2 Score 0 1  Altered sleeping 1 -  Tired, decreased energy 0 -  Change in appetite 0 -  Feeling bad or failure about yourself  0 -  Trouble concentrating 0 -  Moving slowly or fidgety/restless 0 -  Suicidal thoughts 0 -  PHQ-9 Score 1 -  Difficult doing work/chores Not difficult at all -   Anhedonia: no Weight changes: no Insomnia: yes hard to fall asleep  Hypersomnia: no Fatigue/loss of energy: no Feelings of worthlessness: no Feelings of guilt: no Impaired concentration/indecisiveness: yes Suicidal ideations: no  Crying spells: no Recent Stressors/Life Changes: no   Relationship problems: no   Family stress: no     Financial stress: no    Job stress: no    Recent death/loss: no GAD 7 : Generalized Anxiety Score 05/26/2019 04/24/2019 03/23/2019  Nervous, Anxious, on Edge 0 1 0  Control/stop worrying 0 0 0  Worry too much - different things 0 0 0  Trouble relaxing 0 1 0  Restless 0 1 0  Easily annoyed or irritable 0 0 0  Afraid - awful might happen 0 0 0  Total GAD 7 Score 0 3 0  Anxiety Difficulty Not difficult at all Not difficult at all -   HYPERLIPIDEMIA Continues on Atorvastatin 10 MG.  In  September had CT of head which was unremarkable and MRI which showed chronic cerebellar infarct and chronic microvascular changes, with no acute stroke.  She was told to continue current regimen and ASA 81 MG. Had stent placed left carotid in December 2020.   Hyperlipidemia status: good compliance Satisfied with current treatment?  yes Side effects:  no Medication compliance: good compliance Past cholesterol meds: Lipitor Supplements: none Aspirin:  yes The 10-year ASCVD risk score Mikey Bussing DC Jr., et al., 2013) is: 24.4%   Values used to calculate the score:     Age: 17 years     Sex: Female     Is Non-Hispanic African American: No     Diabetic: No     Tobacco smoker: No     Systolic Blood Pressure: Q000111Q mmHg     Is BP treated: Yes     HDL Cholesterol: 44 mg/dL     Total Cholesterol: 228 mg/dL Chest pain:  no Coronary artery disease:  yes Family history CAD:  yes, mother had stroke and MI Family history early CAD:  no   ELEVATED A1C: Past A1C 5.8% in September. Polydipsia/polyuria: no Visual disturbance: no Chest pain: no Paresthesias: no  Relevant past medical, surgical, family and social history reviewed and updated as indicated. Interim medical history since our last visit reviewed. Allergies and medications reviewed and  updated.  Review of Systems  Constitutional: Negative for activity change, appetite change, diaphoresis, fatigue and fever.  Respiratory: Negative for cough, chest tightness and shortness of breath.   Cardiovascular: Negative for chest pain, palpitations and leg swelling.  Gastrointestinal: Negative.   Endocrine: Negative for polydipsia, polyphagia and polyuria.  Neurological: Negative.   Psychiatric/Behavioral: Positive for sleep disturbance. Negative for decreased concentration, self-injury and suicidal ideas. The patient is nervous/anxious (occasional).     Per HPI unless specifically indicated above     Objective:    BP 132/68 (BP Location: Left  Arm, Patient Position: Sitting)   Pulse 84   Temp 98 F (36.7 C) (Oral)   SpO2 97%   Wt Readings from Last 3 Encounters:  04/28/19 154 lb (69.9 kg)  04/22/19 150 lb (68 kg)  03/26/19 154 lb (69.9 kg)    Physical Exam Vitals and nursing note reviewed.  Constitutional:      General: She is awake. She is not in acute distress.    Appearance: She is well-developed. She is not ill-appearing.  HENT:     Head: Normocephalic.     Right Ear: Hearing normal.     Left Ear: Hearing normal.  Eyes:     General: Lids are normal.        Right eye: No discharge.        Left eye: No discharge.     Conjunctiva/sclera: Conjunctivae normal.     Pupils: Pupils are equal, round, and reactive to light.  Neck:     Vascular: No carotid bruit.  Cardiovascular:     Rate and Rhythm: Normal rate and regular rhythm.     Heart sounds: Normal heart sounds. No murmur. No gallop.   Pulmonary:     Effort: Pulmonary effort is normal. No accessory muscle usage or respiratory distress.     Breath sounds: Normal breath sounds.  Abdominal:     General: Bowel sounds are normal.     Palpations: Abdomen is soft.  Musculoskeletal:     Cervical back: Normal range of motion and neck supple.     Right lower leg: No edema.     Left lower leg: No edema.  Skin:    General: Skin is warm and dry.  Neurological:     Mental Status: She is alert and oriented to person, place, and time.  Psychiatric:        Attention and Perception: Attention normal.        Mood and Affect: Mood normal.        Speech: Speech normal.        Behavior: Behavior normal. Behavior is cooperative.    Results for orders placed or performed during the hospital encounter of 04/22/19  MRSA PCR Screening   Specimen: Nasal Mucosa; Nasopharyngeal  Result Value Ref Range   MRSA by PCR NEGATIVE NEGATIVE  BUN  Result Value Ref Range   BUN 18 8 - 23 mg/dL  Creatinine, serum  Result Value Ref Range   Creatinine, Ser 0.63 0.44 - 1.00 mg/dL   GFR  calc non Af Amer >60 >60 mL/min   GFR calc Af Amer >60 >60 mL/min  CBC  Result Value Ref Range   WBC 8.7 4.0 - 10.5 K/uL   RBC 3.79 (L) 3.87 - 5.11 MIL/uL   Hemoglobin 11.6 (L) 12.0 - 15.0 g/dL   HCT 34.8 (L) 36.0 - 46.0 %   MCV 91.8 80.0 - 100.0 fL   MCH 30.6 26.0 - 34.0 pg  MCHC 33.3 30.0 - 36.0 g/dL   RDW 12.9 11.5 - 15.5 %   Platelets 333 150 - 400 K/uL   nRBC 0.0 0.0 - 0.2 %  Basic metabolic panel  Result Value Ref Range   Sodium 140 135 - 145 mmol/L   Potassium 4.2 3.5 - 5.1 mmol/L   Chloride 108 98 - 111 mmol/L   CO2 26 22 - 32 mmol/L   Glucose, Bld 106 (H) 70 - 99 mg/dL   BUN 16 8 - 23 mg/dL   Creatinine, Ser 0.51 0.44 - 1.00 mg/dL   Calcium 8.6 (L) 8.9 - 10.3 mg/dL   GFR calc non Af Amer >60 >60 mL/min   GFR calc Af Amer >60 >60 mL/min   Anion gap 6 5 - 15  Glucose, capillary  Result Value Ref Range   Glucose-Capillary 100 (H) 70 - 99 mg/dL  Glucose, capillary  Result Value Ref Range   Glucose-Capillary 126 (H) 70 - 99 mg/dL  POCT Activated clotting time  Result Value Ref Range   Activated Clotting Time 263 seconds      Assessment & Plan:   Problem List Items Addressed This Visit      Cardiovascular and Mediastinum   Hypertension - Primary    Chronic, stable with BP at goal on recheck.  Continue current medication regimen and adjust as needed.  CMP today.  Recommend checking BP at home regularly.  Return in 6 months.      Relevant Orders   Comprehensive metabolic panel     Other   Hyperlipidemia    Chronic, ongoing.  Continue current medication regimen and adjust as needed.  Lipid panel today.      Relevant Orders   Comprehensive metabolic panel   Lipid Panel w/o Chol/HDL Ratio out   Insomnia    Chronic, using MJ at home for relaxation.  Recommend avoiding this and using Melatonin OTC.  She will trial this.      Marijuana use    Reports occasional use.  Recommend cutting back on this.  Has not been using Buspar, finds more benefit from [place].  Continue to recommend no use.      Situational anxiety    Ongoing and increased with recent stressors. Denies SI/HI.  Not taking Buspar 5 MG BID.  Would avoid benzo use due to age and risk for chronic use.  Could trial Sertraline daily if ongoing issues or increase dose Buspar, but patient refuses at this time.  Return in 6 months for follow-up.      Elevated hemoglobin A1c    Recheck A1C and initiate medication as needed.      Relevant Orders   HgB A1c       Follow up plan: Return in about 6 months (around 11/23/2019) for Anxiety, HTN/HLD, Insomnia.

## 2019-05-26 NOTE — Patient Instructions (Signed)
DASH Eating Plan DASH stands for "Dietary Approaches to Stop Hypertension." The DASH eating plan is a healthy eating plan that has been shown to reduce high blood pressure (hypertension). It may also reduce your risk for type 2 diabetes, heart disease, and stroke. The DASH eating plan may also help with weight loss. What are tips for following this plan?  General guidelines  Avoid eating more than 2,300 mg (milligrams) of salt (sodium) a day. If you have hypertension, you may need to reduce your sodium intake to 1,500 mg a day.  Limit alcohol intake to no more than 1 drink a day for nonpregnant women and 2 drinks a day for men. One drink equals 12 oz of beer, 5 oz of wine, or 1 oz of hard liquor.  Work with your health care provider to maintain a healthy body weight or to lose weight. Ask what an ideal weight is for you.  Get at least 30 minutes of exercise that causes your heart to beat faster (aerobic exercise) most days of the week. Activities may include walking, swimming, or biking.  Work with your health care provider or diet and nutrition specialist (dietitian) to adjust your eating plan to your individual calorie needs. Reading food labels   Check food labels for the amount of sodium per serving. Choose foods with less than 5 percent of the Daily Value of sodium. Generally, foods with less than 300 mg of sodium per serving fit into this eating plan.  To find whole grains, look for the word "whole" as the first word in the ingredient list. Shopping  Buy products labeled as "low-sodium" or "no salt added."  Buy fresh foods. Avoid canned foods and premade or frozen meals. Cooking  Avoid adding salt when cooking. Use salt-free seasonings or herbs instead of table salt or sea salt. Check with your health care provider or pharmacist before using salt substitutes.  Do not fry foods. Cook foods using healthy methods such as baking, boiling, grilling, and broiling instead.  Cook with  heart-healthy oils, such as olive, canola, soybean, or sunflower oil. Meal planning  Eat a balanced diet that includes: ? 5 or more servings of fruits and vegetables each day. At each meal, try to fill half of your plate with fruits and vegetables. ? Up to 6-8 servings of whole grains each day. ? Less than 6 oz of lean meat, poultry, or fish each day. A 3-oz serving of meat is about the same size as a deck of cards. One egg equals 1 oz. ? 2 servings of low-fat dairy each day. ? A serving of nuts, seeds, or beans 5 times each week. ? Heart-healthy fats. Healthy fats called Omega-3 fatty acids are found in foods such as flaxseeds and coldwater fish, like sardines, salmon, and mackerel.  Limit how much you eat of the following: ? Canned or prepackaged foods. ? Food that is high in trans fat, such as fried foods. ? Food that is high in saturated fat, such as fatty meat. ? Sweets, desserts, sugary drinks, and other foods with added sugar. ? Full-fat dairy products.  Do not salt foods before eating.  Try to eat at least 2 vegetarian meals each week.  Eat more home-cooked food and less restaurant, buffet, and fast food.  When eating at a restaurant, ask that your food be prepared with less salt or no salt, if possible. What foods are recommended? The items listed may not be a complete list. Talk with your dietitian about   what dietary choices are best for you. Grains Whole-grain or whole-wheat bread. Whole-grain or whole-wheat pasta. Brown rice. Oatmeal. Quinoa. Bulgur. Whole-grain and low-sodium cereals. Pita bread. Low-fat, low-sodium crackers. Whole-wheat flour tortillas. Vegetables Fresh or frozen vegetables (raw, steamed, roasted, or grilled). Low-sodium or reduced-sodium tomato and vegetable juice. Low-sodium or reduced-sodium tomato sauce and tomato paste. Low-sodium or reduced-sodium canned vegetables. Fruits All fresh, dried, or frozen fruit. Canned fruit in natural juice (without  added sugar). Meat and other protein foods Skinless chicken or turkey. Ground chicken or turkey. Pork with fat trimmed off. Fish and seafood. Egg whites. Dried beans, peas, or lentils. Unsalted nuts, nut butters, and seeds. Unsalted canned beans. Lean cuts of beef with fat trimmed off. Low-sodium, lean deli meat. Dairy Low-fat (1%) or fat-free (skim) milk. Fat-free, low-fat, or reduced-fat cheeses. Nonfat, low-sodium ricotta or cottage cheese. Low-fat or nonfat yogurt. Low-fat, low-sodium cheese. Fats and oils Soft margarine without trans fats. Vegetable oil. Low-fat, reduced-fat, or light mayonnaise and salad dressings (reduced-sodium). Canola, safflower, olive, soybean, and sunflower oils. Avocado. Seasoning and other foods Herbs. Spices. Seasoning mixes without salt. Unsalted popcorn and pretzels. Fat-free sweets. What foods are not recommended? The items listed may not be a complete list. Talk with your dietitian about what dietary choices are best for you. Grains Baked goods made with fat, such as croissants, muffins, or some breads. Dry pasta or rice meal packs. Vegetables Creamed or fried vegetables. Vegetables in a cheese sauce. Regular canned vegetables (not low-sodium or reduced-sodium). Regular canned tomato sauce and paste (not low-sodium or reduced-sodium). Regular tomato and vegetable juice (not low-sodium or reduced-sodium). Pickles. Olives. Fruits Canned fruit in a light or heavy syrup. Fried fruit. Fruit in cream or butter sauce. Meat and other protein foods Fatty cuts of meat. Ribs. Fried meat. Bacon. Sausage. Bologna and other processed lunch meats. Salami. Fatback. Hotdogs. Bratwurst. Salted nuts and seeds. Canned beans with added salt. Canned or smoked fish. Whole eggs or egg yolks. Chicken or turkey with skin. Dairy Whole or 2% milk, cream, and half-and-half. Whole or full-fat cream cheese. Whole-fat or sweetened yogurt. Full-fat cheese. Nondairy creamers. Whipped toppings.  Processed cheese and cheese spreads. Fats and oils Butter. Stick margarine. Lard. Shortening. Ghee. Bacon fat. Tropical oils, such as coconut, palm kernel, or palm oil. Seasoning and other foods Salted popcorn and pretzels. Onion salt, garlic salt, seasoned salt, table salt, and sea salt. Worcestershire sauce. Tartar sauce. Barbecue sauce. Teriyaki sauce. Soy sauce, including reduced-sodium. Steak sauce. Canned and packaged gravies. Fish sauce. Oyster sauce. Cocktail sauce. Horseradish that you find on the shelf. Ketchup. Mustard. Meat flavorings and tenderizers. Bouillon cubes. Hot sauce and Tabasco sauce. Premade or packaged marinades. Premade or packaged taco seasonings. Relishes. Regular salad dressings. Where to find more information:  National Heart, Lung, and Blood Institute: www.nhlbi.nih.gov  American Heart Association: www.heart.org Summary  The DASH eating plan is a healthy eating plan that has been shown to reduce high blood pressure (hypertension). It may also reduce your risk for type 2 diabetes, heart disease, and stroke.  With the DASH eating plan, you should limit salt (sodium) intake to 2,300 mg a day. If you have hypertension, you may need to reduce your sodium intake to 1,500 mg a day.  When on the DASH eating plan, aim to eat more fresh fruits and vegetables, whole grains, lean proteins, low-fat dairy, and heart-healthy fats.  Work with your health care provider or diet and nutrition specialist (dietitian) to adjust your eating plan to your   individual calorie needs. This information is not intended to replace advice given to you by your health care provider. Make sure you discuss any questions you have with your health care provider. Document Revised: 04/19/2017 Document Reviewed: 04/30/2016 Elsevier Patient Education  2020 Elsevier Inc.  

## 2019-05-26 NOTE — Telephone Encounter (Signed)
[name] son will come in to pick up a disability placard,

## 2019-05-26 NOTE — Assessment & Plan Note (Signed)
Recheck A1C and initiate medication as needed.

## 2019-05-26 NOTE — Assessment & Plan Note (Signed)
Ongoing and increased with recent stressors. Denies SI/HI.  Not taking Buspar 5 MG BID.  Would avoid benzo use due to age and risk for chronic use.  Could trial Sertraline daily if ongoing issues or increase dose Buspar, but patient refuses at this time.  Return in 6 months for follow-up.

## 2019-05-26 NOTE — Assessment & Plan Note (Signed)
Reports occasional use.  Recommend cutting back on this.  Has not been using Buspar, finds more benefit from [place]. Continue to recommend no use.

## 2019-05-26 NOTE — Assessment & Plan Note (Signed)
Chronic, using MJ at home for relaxation.  Recommend avoiding this and using Melatonin OTC.  She will trial this.

## 2019-05-27 LAB — COMPREHENSIVE METABOLIC PANEL
ALT: 24 IU/L (ref 0–32)
AST: 21 IU/L (ref 0–40)
Albumin/Globulin Ratio: 1.8 (ref 1.2–2.2)
Albumin: 4.3 g/dL (ref 3.7–4.7)
Alkaline Phosphatase: 88 IU/L (ref 39–117)
BUN/Creatinine Ratio: 16 (ref 12–28)
BUN: 11 mg/dL (ref 8–27)
Bilirubin Total: 0.4 mg/dL (ref 0.0–1.2)
CO2: 22 mmol/L (ref 20–29)
Calcium: 9.7 mg/dL (ref 8.7–10.3)
Chloride: 104 mmol/L (ref 96–106)
Creatinine, Ser: 0.67 mg/dL (ref 0.57–1.00)
GFR calc Af Amer: 99 mL/min/{1.73_m2} (ref >59)
GFR calc non Af Amer: 86 mL/min/{1.73_m2} (ref >59)
Globulin, Total: 2.4 g/dL (ref 1.5–4.5)
Glucose: 107 mg/dL — ABNORMAL HIGH (ref 65–99)
Potassium: 4.3 mmol/L (ref 3.5–5.2)
Sodium: 140 mmol/L (ref 134–144)
Total Protein: 6.7 g/dL (ref 6.0–8.5)

## 2019-05-27 LAB — LIPID PANEL W/O CHOL/HDL RATIO
Cholesterol, Total: 150 mg/dL (ref 100–199)
HDL: 46 mg/dL (ref >39)
LDL Chol Calc (NIH): 76 mg/dL (ref 0–99)
Triglycerides: 162 mg/dL — ABNORMAL HIGH (ref 0–149)
VLDL Cholesterol Cal: 28 mg/dL (ref 5–40)

## 2019-05-27 LAB — HEMOGLOBIN A1C
Est. average glucose Bld gHb Est-mCnc: 120 mg/dL
Hgb A1c MFr Bld: 5.8 % — ABNORMAL HIGH (ref 4.8–5.6)

## 2019-05-27 NOTE — Progress Notes (Signed)
Please let Jae know that overall labs look good. Continue cholesterol medication.  Diabetes blood work remains same with no worsening, continues to have some prediabetes.  We will continue to monitor this to ensure it does not creep up to diabetes.  Continue exercise and diet focus.  Have a great day!!

## 2019-06-02 ENCOUNTER — Ambulatory Visit: Payer: Medicare Other | Admitting: Nurse Practitioner

## 2019-06-02 ENCOUNTER — Telehealth: Payer: Self-pay | Admitting: Nurse Practitioner

## 2019-06-02 NOTE — Chronic Care Management (AMB) (Signed)
  Chronic Care Management   Outreach Note  06/02/2019 Name: Maiah Bratz MRN: [id number] DOB: 12-22-1940  Mirah Sittler is a 79 y.o. year old female who is a primary care patient of Cannady, Barbaraann Faster, NP. I reached out to [employer] by phone today in response to a referral sent by Ms. Myosha Cecere's PCP, Marnee Guarneri NP     An unsuccessful telephone outreach was attempted today. The patient was referred to the case management team by for assistance with care management and care coordination.   Follow Up Plan: A HIPPA compliant phone message was left for the patient providing contact information and requesting a return call.  The care management team will reach out to the patient again over the next 7 days.  If patient returns call to provider office, please advise to call Embedded Care Management Care Guide Glenna Durand LPN at [phone]  Hana Trippett, LPN Health Advisor, [facility] ??[email or web address] ??[phone]

## 2019-06-09 NOTE — Chronic Care Management (AMB) (Signed)
  Chronic Care Management   Note  06/09/2019 Name: Banesa Tristan MRN: [id number] DOB: Aug 16, 1940  Sharalyn Lomba is a 79 y.o. year old female who is a primary care patient of Cannady, Barbaraann Faster, NP. I reached out to [employer] by phone today in response to a referral sent by Ms. Yanette Kagan's PCP, Marnee Guarneri NP     Ms. Tolle was given information about Chronic Care Management services today including:  1. CCM service includes personalized support from designated clinical staff supervised by her physician, including individualized plan of care and coordination with other care providers 2. 24/7 contact phone numbers for assistance for urgent and routine care needs. 3. Service will only be billed when office clinical staff spend 20 minutes or more in a month to coordinate care. 4. Only one practitioner may furnish and bill the service in a calendar month. 5. The patient may stop CCM services at any time (effective at the end of the month) by phone call to the office staff. 6. The patient will be responsible for cost sharing (co-pay) of up to 20% of the service fee (after annual deductible is met).  Patient agreed to services and verbal consent obtained.   Follow up plan: Telephone appointment with care management team member scheduled for:07/14/2019  Glenna Durand, LPN Health Advisor, [facility] ??[email or web address] ??[phone]

## 2019-07-14 ENCOUNTER — Ambulatory Visit (INDEPENDENT_AMBULATORY_CARE_PROVIDER_SITE_OTHER): Payer: Medicare Other | Admitting: Pharmacist

## 2019-07-14 DIAGNOSIS — R7309 Other abnormal glucose: Secondary | ICD-10-CM

## 2019-07-14 DIAGNOSIS — I10 Essential (primary) hypertension: Secondary | ICD-10-CM | POA: Diagnosis not present

## 2019-07-14 DIAGNOSIS — E782 Mixed hyperlipidemia: Secondary | ICD-10-CM | POA: Diagnosis not present

## 2019-07-14 NOTE — Chronic Care Management (AMB) (Signed)
Chronic Care Management   Note  07/14/2019 Name: Tina Hardy MRN: [id number] DOB: Dec 07, 1940   Subjective:  Tina Hardy is a 79 y.o. year old female who is a primary care patient of Cannady, Tina Faster, NP. The CCM team was consulted for assistance with chronic disease management and care coordination needs.    Contacted patient for medication management review as scheduled.   Review of patient status, including review of consultants reports, laboratory and other test data, was performed as part of comprehensive evaluation and provision of chronic care management services.   SDOH (Social Determinants of Health) assessments and interventions performed:  SDOH Interventions     Most Recent Value  SDOH Interventions  SDOH Interventions for the Following Domains  Financial Strain  Financial Strain Interventions  Other (Comment) [Care guide referral placed]       Objective:  Lab Results  Component Value Date   CREATININE 0.67 05/26/2019   CREATININE 0.51 04/23/2019   CREATININE 0.63 04/22/2019    Lab Results  Component Value Date   HGBA1C 5.8 (H) 05/26/2019       Component Value Date/Time   CHOL 150 05/26/2019 0939   TRIG 162 (H) 05/26/2019 0939   HDL 46 05/26/2019 0939   CHOLHDL 5.2 02/16/2019 1945   VLDL 52 (H) 02/16/2019 1945   LDLCALC 76 05/26/2019 0939    Clinical ASCVD: Yes - hx TIA The 10-year ASCVD risk score Tina Bussing DC Jr., et al., 2013) is: 26.6%   Values used to calculate the score:     Age: 9 years     Sex: Female     Is Non-Hispanic African American: No     Diabetic: No     Tobacco smoker: No     Systolic Blood Pressure: 696 mmHg     Is BP treated: Yes     HDL Cholesterol: 46 mg/dL     Total Cholesterol: 150 mg/dL    BP Readings from Last 3 Encounters:  05/26/19 132/68  04/24/19 134/76  04/23/19 (!) 122/57    Allergies  Allergen Reactions  . Codeine Other (See Comments)    Unknown reaction   . Penicillins Other (See Comments)   Unknown reaction  Has patient had a PCN reaction causing immediate rash, facial/tongue/throat swelling, SOB or lightheadedness with hypotension: n/a Has patient had a PCN reaction causing severe rash involving mucus membranes or skin necrosis: n/a Has patient had a PCN reaction that required hospitalization: n/a Has patient had a PCN reaction occurring within the last 10 years: n/a If all of the above answers are "NO", then may proceed with Cephalosporin use.     Medications Reviewed Today    Reviewed by De Hollingshead, [facility] (Pharmacist) on 07/14/19 at 1508  Med List Status: <None>  Medication Order Taking? Sig Documenting Provider Last Dose Status Informant  amLODipine (NORVASC) 5 MG tablet [id number] Yes Take 1 tablet (5 mg total) by mouth daily. Marnee Guarneri T, NP Taking Active Self  aspirin EC 81 MG EC tablet [id number] Yes Take 1 tablet (81 mg total) by mouth daily. Stegmayer, Janalyn Harder, PA-C Taking Active   atorvastatin (LIPITOR) 10 MG tablet [id number] Yes Take 1 tablet (10 mg total) by mouth daily. Marnee Guarneri T, NP Taking Active Self  busPIRone (BUSPAR) 5 MG tablet [id number] Yes Take 1 tablet (5 mg total) by mouth 2 (two) times daily. Venita Lick, NP Taking Active            Med Note (TRAVIS, CATHERINE E  Tue Jul 14, 2019  3:04 PM) Taking QAM  clopidogrel (PLAVIX) 75 MG tablet [id number] Yes Take 1 tablet (75 mg total) by mouth daily at 6 (six) AM. Stegmayer, Janalyn Harder, PA-C Taking Active   Cyanocobalamin (VITAMIN B-12 PO) [id number] Yes Take 1 drop by mouth daily as needed (ENERGY SUPPORT). [provider] Taking Active Self  Multiple Vitamins-Minerals (ADULT GUMMY PO) [id number] Yes Take 2 tablets by mouth daily. [provider] Taking Active Self           Assessment:   Goals Addressed            This Visit's Progress     Patient Stated   . PharmD "My medications are expensive" (pt-stated)       CARE PLAN ENTRY (see longtitudinal plan  of care for additional care plan information)  Current Barriers:  . Polypharmacy; complex patient with multiple comorbidities including HTN, hx TIA, stenosis of L carotid artery . Notes that she lives in the home of her niece, [name] ([phone]), with her son, [name]. [name] notes that [name] lives in her own home in [place]. They note that [name] is the patient's POA. 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 He notes that he has been mostly out of work over the pandemic . They note that patient has very little income, and they occasionally struggle to make ends meet. Patient notes that she pays utility bills, but does not pay rent . Self-manages medications.  . Most recent eGFR: 18 o HTN: amlodipine 5 mg daily  o HLD: atorvastatin 10 mg daily o Aortic valve sclerosis s/p stenting 04/21/20: aspirin 81 mg daily, clopidogrel 75 mg daily; She was to have f/u with vascular about 3 weeks post stenting, but this appointment was not scheduled.  o Anxiety: buspirone 5 mg BID, though patient has only been taking QAM. She and son note continued nerves and request "a nerve pill like xanax"  Pharmacist Clinical Goal(s):  Marland Kitchen Over the next 90 days, patient will work with PharmD and provider towards optimized medication management  Interventions: . Comprehensive medication review performed; medication list updated in electronic medical record . Reviewed risks of benzodiazepines in the elderly. Recommended patient increase buspirone to BID as prescribed.  . Reviewed that patient did not follow up with vascular as recommended. Provided vascular phone number to son, he noted he would call today and schedule follow up.  . As patient gave me permission to call her niece, [name], I attempted to call to review healthcare needs and financial picture. Unable to leave a voicemail for the number provided to me. .  Given reports of financial concerns and complex financial situation, as well as difficulties with anxiety, will place referral for LCSW and Care Guide  Patient Self Care Activities:  . Patient will take medications as prescribed  Initial goal documentation        Plan: - Scheduled f/u call 08/19/19  Catie Darnelle Maffucci, PharmD, [facility] [phone]

## 2019-07-14 NOTE — Patient Instructions (Addendum)
Ms. Virden,   It was great talking to you today!  Make sure you scheduled follow up with the Vascular doctor, since they had wanted to see you about 3 weeks after your stent was put in December.   Your current medications are:   - Amlodipine 5 mg daily (blood pressure) - Atorvastatin 10 mg daily (cholesterol) - Aspirin 81 mg daily (artery stent) - Clopidogrel 75 mg daily (artery stent) - Buspirone 5 mg twice daily (nerves)  Try increasing the buspirone to twice daily, as prescribed, to help with your nerves.   Visit Information  Goals Addressed            This Visit's Progress     Patient Stated   . PharmD "My medications are expensive" (pt-stated)       CARE PLAN ENTRY (see longtitudinal plan of care for additional care plan information)  Current Barriers:  . Polypharmacy; complex patient with multiple comorbidities including HTN, hx TIA, stenosis of L carotid artery . Notes that she lives in the home of her niece, [name] ([phone]), with her son, [name]. [name] notes that [name] lives in her own home in [place]. They note that [name] is the patient's POA. 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 He notes that he has been mostly out of work over the pandemic . They note that patient has very little income, and they occasionally struggle to make ends meet. Patient notes that she pays utility bills, but does not pay rent . Self-manages medications.  . Most recent eGFR: 19 o HTN: amlodipine 5 mg daily  o HLD: atorvastatin 10 mg daily o Aortic valve sclerosis s/p stenting 04/21/20: aspirin 81 mg daily, clopidogrel 75 mg daily; She was to have f/u with vascular about 3 weeks post stenting, but this appointment was not scheduled.  o Anxiety: buspirone 5 mg BID, though patient has only been taking QAM. She and son note continued nerves and request "a nerve pill like  xanax"  Pharmacist Clinical Goal(s):  Marland Kitchen Over the next 90 days, patient will work with PharmD and provider towards optimized medication management  Interventions: . Comprehensive medication review performed; medication list updated in electronic medical record . Reviewed risks of benzodiazepines in the elderly. Recommended patient increase buspirone to BID as prescribed.  . Reviewed that patient did not follow up with vascular as recommended. Provided vascular phone number to son, he noted he would call today and schedule follow up.  . As patient gave me permission to call her niece, [name], I attempted to call to review healthcare needs and financial picture. Unable to leave a voicemail for the number provided to me. . Given reports of financial concerns and complex financial situation, as well as difficulties with anxiety, will place referral for LCSW and Care Guide  Patient Self Care Activities:  . Patient will take medications as prescribed  Initial goal documentation        Print copy of patient instructions provided.   Plan: - Scheduled f/u call 08/19/19  Catie Darnelle Maffucci, PharmD, [facility] [phone]

## 2019-07-29 ENCOUNTER — Ambulatory Visit (INDEPENDENT_AMBULATORY_CARE_PROVIDER_SITE_OTHER): Payer: Medicare Other

## 2019-07-29 ENCOUNTER — Telehealth: Payer: Self-pay | Admitting: Nurse Practitioner

## 2019-07-29 ENCOUNTER — Encounter: Payer: Self-pay | Admitting: Nurse Practitioner

## 2019-07-29 DIAGNOSIS — Z0000 Encounter for general adult medical examination without abnormal findings: Secondary | ICD-10-CM | POA: Diagnosis not present

## 2019-07-29 NOTE — Addendum Note (Signed)
Addended by: Tyler Aas A on: 07/29/2019 10:27 AM   Modules accepted: Orders

## 2019-07-29 NOTE — Patient Instructions (Signed)
Tina Hardy , Thank you for taking time to come for your Medicare Wellness Visit. I appreciate your ongoing commitment to your health goals. Please review the following plan we discussed and let me know if I can assist you in the future.   Screening recommendations/referrals: Colonoscopy: no longer required  Mammogram: no longer required  Bone Density: declined  Recommended yearly ophthalmology/optometry visit for glaucoma screening and checkup Recommended yearly dental visit for hygiene and checkup  Vaccinations: Influenza vaccine: declined  Pneumococcal vaccine: declined  Tdap vaccine: declined  Shingles vaccine: declined    Covid-19: declined   Advanced directives: Advance directive discussed with you today. I have mailed a copy for you to complete at home and have notarized. Once this is complete please bring a copy in to our office so we can scan it into your chart.  Conditions/risks identified: discussed chronic care management team   Next appointment: Follow up in one year for your annual wellness visit    Preventive Care 65 Years and Older, Female Preventive care refers to lifestyle choices and visits with your health care provider that can promote health and wellness. What does preventive care include?  A yearly physical exam. This is also called an annual well check.  Dental exams once or twice a year.  Routine eye exams. Ask your health care provider how often you should have your eyes checked.  Personal lifestyle choices, including:  Daily care of your teeth and gums.  Regular physical activity.  Eating a healthy diet.  Avoiding tobacco and drug use.  Limiting alcohol use.  Practicing safe sex.  Taking low-dose aspirin every day.  Taking vitamin and mineral supplements as recommended by your health care provider. What happens during an annual well check? The services and screenings done by your health care provider during your annual well check will  depend on your age, overall health, lifestyle risk factors, and family history of disease. Counseling  Your health care provider may ask you questions about your:  Alcohol use.  Tobacco use.  Drug use.  Emotional well-being.  Home and relationship well-being.  Sexual activity.  Eating habits.  History of falls.  Memory and ability to understand (cognition).  Work and work Statistician.  Reproductive health. Screening  You may have the following tests or measurements:  Height, weight, and BMI.  Blood pressure.  Lipid and cholesterol levels. These may be checked every 5 years, or more frequently if you are over 73 years old.  Skin check.  Lung cancer screening. You may have this screening every year starting at age 47 if you have a 30-pack-year history of smoking and currently smoke or have quit within the past 15 years.  Fecal occult blood test (FOBT) of the stool. You may have this test every year starting at age 56.  Flexible sigmoidoscopy or colonoscopy. You may have a sigmoidoscopy every 5 years or a colonoscopy every 10 years starting at age 48.  Hepatitis C blood test.  Hepatitis B blood test.  Sexually transmitted disease (STD) testing.  Diabetes screening. This is done by checking your blood sugar (glucose) after you have not eaten for a while (fasting). You may have this done every 1-3 years.  Bone density scan. This is done to screen for osteoporosis. You may have this done starting at age 68.  Mammogram. This may be done every 1-2 years. Talk to your health care provider about how often you should have regular mammograms. Talk with your health care provider about  your test results, treatment options, and if necessary, the need for more tests. Vaccines  Your health care provider may recommend certain vaccines, such as:  Influenza vaccine. This is recommended every year.  Tetanus, diphtheria, and acellular pertussis (Tdap, Td) vaccine. You may need a  Td booster every 10 years.  Zoster vaccine. You may need this after age 33.  Pneumococcal 13-valent conjugate (PCV13) vaccine. One dose is recommended after age 76.  Pneumococcal polysaccharide (PPSV23) vaccine. One dose is recommended after age 85. Talk to your health care provider about which screenings and vaccines you need and how often you need them. This information is not intended to replace advice given to you by your health care provider. Make sure you discuss any questions you have with your health care provider. Document Released: 06/03/2015 Document Revised: 01/25/2016 Document Reviewed: 03/08/2015 Elsevier Interactive Patient Education  2017 [place] Prevention in the Home Falls can cause injuries. They can happen to people of all ages. There are many things you can do to make your home safe and to help prevent falls. What can I do on the outside of my home?  Regularly fix the edges of walkways and driveways and fix any cracks.  Remove anything that might make you trip as you walk through a door, such as a raised step or threshold.  Trim any bushes or trees on the path to your home.  Use bright outdoor lighting.  Clear any walking paths of anything that might make someone trip, such as rocks or tools.  Regularly check to see if handrails are loose or broken. Make sure that both sides of any steps have handrails.  Any raised decks and porches should have guardrails on the edges.  Have any leaves, snow, or ice cleared regularly.  Use sand or salt on walking paths during winter.  Clean up any spills in your garage right away. This includes oil or grease spills. What can I do in the bathroom?  Use night lights.  Install grab bars by the toilet and in the tub and shower. Do not use towel bars as grab bars.  Use non-skid mats or decals in the tub or shower.  If you need to sit down in the shower, use a plastic, non-slip stool.  Keep the floor dry. Clean  up any water that spills on the floor as soon as it happens.  Remove soap buildup in the tub or shower regularly.  Attach bath mats securely with double-sided non-slip rug tape.  Do not have throw rugs and other things on the floor that can make you trip. What can I do in the bedroom?  Use night lights.  Make sure that you have a light by your bed that is easy to reach.  Do not use any sheets or blankets that are too big for your bed. They should not hang down onto the floor.  Have a firm chair that has side arms. You can use this for support while you get dressed.  Do not have throw rugs and other things on the floor that can make you trip. What can I do in the kitchen?  Clean up any spills right away.  Avoid walking on wet floors.  Keep items that you use a lot in easy-to-reach places.  If you need to reach something above you, use a strong step stool that has a grab bar.  Keep electrical cords out of the way.  Do not use floor polish or wax  that makes floors slippery. If you must use wax, use non-skid floor wax.  Do not have throw rugs and other things on the floor that can make you trip. What can I do with my stairs?  Do not leave any items on the stairs.  Make sure that there are handrails on both sides of the stairs and use them. Fix handrails that are broken or loose. Make sure that handrails are as long as the stairways.  Check any carpeting to make sure that it is firmly attached to the stairs. Fix any carpet that is loose or worn.  Avoid having throw rugs at the top or bottom of the stairs. If you do have throw rugs, attach them to the floor with carpet tape.  Make sure that you have a light switch at the top of the stairs and the bottom of the stairs. If you do not have them, ask someone to add them for you. What else can I do to help prevent falls?  Wear shoes that:  Do not have high heels.  Have rubber bottoms.  Are comfortable and fit you well.  Are  closed at the toe. Do not wear sandals.  If you use a stepladder:  Make sure that it is fully opened. Do not climb a closed stepladder.  Make sure that both sides of the stepladder are locked into place.  Ask someone to hold it for you, if possible.  Clearly mark and make sure that you can see:  Any grab bars or handrails.  First and last steps.  Where the edge of each step is.  Use tools that help you move around (mobility aids) if they are needed. These include:  Canes.  Walkers.  Scooters.  Crutches.  Turn on the lights when you go into a dark area. Replace any light bulbs as soon as they burn out.  Set up your furniture so you have a clear path. Avoid moving your furniture around.  If any of your floors are uneven, fix them.  If there are any pets around you, be aware of where they are.  Review your medicines with your doctor. Some medicines can make you feel dizzy. This can increase your chance of falling. Ask your doctor what other things that you can do to help prevent falls. This information is not intended to replace advice given to you by your health care provider. Make sure you discuss any questions you have with your health care provider. Document Released: 03/03/2009 Document Revised: 10/13/2015 Document Reviewed: 06/11/2014 Elsevier Interactive Patient Education  2017 [employer].

## 2019-07-29 NOTE — Telephone Encounter (Signed)
   07/29/2019  Name: Tina Hardy   MRN: [id number]   DOB: 1940-05-23   AGE: 79 y.o.   GENDER: female   PCP Venita Lick, NP.   Called pt regarding Community Resource Referral. Pt discussed her loss of husband 3 years ago and how her family member left her with nothing and she is relying on her niece for a place to live and her 84 y/o son who has returned home from [place] to help her but is also not working. They are struggling to make ends meet. 45 minute phone call, will cb on 3/11 to finish discussion for pt's needs.  Food: CG assessed pt's need for food and for her son signing Ms. Gowell up for Meal delivery through [employer] as she is 79 y/o and signing her son Mr. Siebrandt for [employer].  Medicare Extra Help: Completed Medicare Extra help application through the phone and will mail the confirmation page to her home address. Notified pt to please be on the lookout for a letter or phone call from [facility] once they have determined eligibility.  Medicaid: Pt does not have access to a computer she was eager to apply for Medicaid and she wanted to call [facility]. Gave her the number to call and she stated she would be calling after 1pm today.  Housing: Ms. Ranieri mentioned that her niece may be selling the house but would give them another place to live. She was not certain of the time frame.  Dental: Pt mentioned that she had a cavity, will c/b on 3/11 to discuss resources for A1A dental and [facility]  Veteran Benefits: Pt mentioned her deceased husband had been a veteran and wanted to know if she was eligible for any beneftis. Will call on 3/11 to discuss resources.  Transportation: Pt does have a benefit through her [facility] Medicare, will call 3/11 to discuss [employer]. Also discuss Medicaid/Medicare benefit for transportation through [place].  Car Repair: Pt stated that the estimate to get the car repaired  was $600 I mentioned that we could possibly apply for help paying either towards the repair or towards monthly bill to help offset the cost. Will discuss on call on 3/11.   [place] . [email or web address]  [phone]

## 2019-07-29 NOTE — Telephone Encounter (Signed)
Email to [facility]  From: Jill Alexanders [facility])  Sent: Wednesday, July 29, 2019 1:30 PM To: '[email or web address]> Subject: Secure: Diamond Referral -Tina Hardy- 07/29/19   Good [name],  [name] is a son of patient of ours at [facility], [name] 79 y/o (I signed her up for the 6 month meal program through [place] separately) [name] is 79 y/o and lives in [place] with his mother.  They are both struggling financially and currently do not have transportation and are not finding enough food to eat through the month.  No dietary Restrictions  Here is the contact information:  Tina Hardy [address] [place], [place] [address]  11/25/1962 [phone] (C)  Thank you [name], and please let me know if you need anything further!   [place] . Embedded Care Coordination [place]  Care Management ??[email or web address]  ??[phone]
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
## 2019-07-29 NOTE — Telephone Encounter (Signed)
Letter to Patient   [name] [address] [place], [place]  [address] Phone:  [phone]   Fax:  [phone]   July 29, 2019   [facility] [address] [place]  [address]   Dear Tina Hardy,  Thank you for taking the time to speak with me on the phone today regarding community resources.   Medicare Extra Help: this is assistance with paying towards your Prescription drug costs. Enclosed is a copy of the application confirmation we completed today over the phone, please hold onto this document. You should be hearing from [employer] once they have determined the status of your application.  Medicaid: Today we discussed you applying for Medicaid to help with your co-pays and prescriptions. If you have not already, please call the Department of Social Services to apply by phone [phone]. This agency can also help assist with food stamps.  Meal Delivery to your Home: I have signed you up for meal delivery through the [place] meals will be delivered on Thursdays and I have also signed up your son Tina Hardy for the [employer] delivery program meals will be delivered on either Tues or Thurs. Let me know if you have any questions.  Please call me at the number listed below if you have any follow-up questions.   [place] . Embedded Care Coordination [place]  Care Management ??[email or web address] ??[place]                        [place], [id number]                                 1

## 2019-07-29 NOTE — Telephone Encounter (Signed)
Email to [place] From: Jill Alexanders [facility])  Sent: Wednesday, July 29, 2019 1:33 PM To: '[email or web address]> Subject: Secure: Referral Meal Delivery - [place]  Good Afternoon [name],  Please see attached application.  Patient Name:             Tina Hardy Patient Address:         [address] [place], [place] [address] Patient Age:                79 Phone:                         [phone] Practice:                      [facility]  Patient stated that she does have a freezer (and space within freezer) and a microwave available for their frozen meal delivery.  I have also encouraged her to eat all food throughout the week so that her freezer space will be available for the following weekly delivery.  Thank you and please let me know if you have any questions,  [place] . Embedded Care Coordination [place]  Care Management ??[email or web address]  ??[phone]

## 2019-07-30 ENCOUNTER — Encounter: Payer: Self-pay | Admitting: Nurse Practitioner

## 2019-07-30 NOTE — Telephone Encounter (Signed)
TEXT MESSAGE TO [name] NIECE  Good Morning Ms. Vatter,      I am a Care Guide with [facility] in [place], [place] and I am assisting Ms. Correen See with connecting with some community resources. She gave me your contact information and mentioned I should try texting you instead of calling.  I am working on applying for some assistance for Ms. Kugelman and had a few questions for you if you could Please call me at your earliest convenience at [phone]  Thank you, [email or web address] [phone]

## 2019-07-30 NOTE — Telephone Encounter (Signed)
   07/30/2019  Name: Tina Hardy   MRN: [id number]   DOB: 1940-07-06   AGE: 79 y.o.   GENDER: female   PCP Venita Lick, NP.   Called pt to continue discussion regarding Community Resource Referral.   Dental: Pt stated that she was still in desperate need of a car. Discussed Dental needs and mentioned the two options I had for A1A dental in [facility] and [facility]. She said she had gone to a low cost place in [place] several years ago but could not remember the name or where it was.  Mentioned she would try to set up appt after she had her car fixed.  Transportation: Benefit available through her [employer],  [employer]. She did not seem open to using it for her appts but I told her I would send her the details along with  Medicaid/Medicare option through [name].  Widowed [name] Benefits: Gave phone # to pt for [name] [phone]  & transferred pt so she could leave message to return her call.  Financial Assistance [facility] Application to assist with offsetting expense of car repair. When asked about her utility bill payments, pt stated that she did not know how much they were and that I needed to call her niece [name] at [phone] since she uses [name] money to pay for her bills. Called [name] VM Box was full, called pt back to see if she had another # and she stated that I should text her or call her sister to see if she had another #. Called [name] pts sister at [phone] she said for me to just keep trying [name] until she picks up.  Will mail another letter to patient with list of resources and will try [name] by text. [place] . [email or web address]  [phone]

## 2019-07-31 NOTE — Telephone Encounter (Signed)
3/12 called pt's niece no answer voicemail box full knb

## 2019-08-04 ENCOUNTER — Telehealth: Payer: Self-pay | Admitting: Nurse Practitioner

## 2019-08-04 NOTE — Telephone Encounter (Signed)
Noted, will discuss with patient and place referral to psychiatry tomorrow if feels ADHD or ADD symptoms present.

## 2019-08-04 NOTE — Telephone Encounter (Signed)
Copied from [place] [phone]. Topic: General - Other >> Aug 04, 2019 11:18 AM Keene Breath wrote: Reason for CRM: Patient called to ask if she could be prescribed Adderall.  Please call to discuss at [phone]

## 2019-08-04 NOTE — Telephone Encounter (Signed)
Called pt scheduled virtual for tomorrow due to her not having transportation

## 2019-08-05 ENCOUNTER — Encounter: Payer: Self-pay | Admitting: Nurse Practitioner

## 2019-08-05 ENCOUNTER — Ambulatory Visit (INDEPENDENT_AMBULATORY_CARE_PROVIDER_SITE_OTHER): Payer: Medicare Other | Admitting: Nurse Practitioner

## 2019-08-05 DIAGNOSIS — F418 Other specified anxiety disorders: Secondary | ICD-10-CM

## 2019-08-05 NOTE — Progress Notes (Signed)
There were no vitals taken for this visit.   Subjective:    Patient ID: Tina Hardy, female    DOB: Oct 18, 1940, 79 y.o.   MRN: [id number]  HPI: Tina Hardy is a 79 y.o. female  Chief Complaint  Patient presents with  . RX REQUEST    . This visit was completed via telephone due to the restrictions of the COVID-19 pandemic. All issues as above were discussed and addressed but no physical exam was performed. If it was felt that the patient should be evaluated in the office, they were directed there. The patient verbally consented to this visit. Patient was unable to complete an audio/visual visit due to Lack of equipment. Due to the catastrophic nature of the COVID-19 pandemic, this visit was done through audio contact only. . Location of the patient: home . Location of the provider: work . Those involved with this call:  . Provider: Marnee Guarneri, DNP . CMA: Yvonna Alanis, CMA . Front Desk/Registration: Don Perking  . Time spent on call: 15 minutes on the phone discussing health concerns. 10 minutes total spent in review of patient's record and preparation of their chart.  . I verified patient identity using two factors (patient name and date of birth). Patient consents verbally to being seen via telemedicine visit today.    MOOD  Her niece helps with bills, "pays my bills with my money", plus provides her roof over her head.  Reports a lot of stressors lately as has no car to get anywhere, currently needs her car fixed, and lives with her son.  Tried Buspar in past, but was not taking.  Continues to use "edibles".  Can not get to the gym due to having no car.  Does get free food from local group, which she reports being thankful for.    She reports he son told her she needs to get some Adderall.  She got her son Tina Hardy on phone, he spoke to me with her permission, who reports that he thinks she would benefit from medication to help with focus.  He endorses his  mother's stories about family member taking money from them, had been wealthy and when husband passed family members took the money, is correct information.  States that he feels the episodes over past year have brought out some of his mom's mental health issues.  Both agree with referral to psychiatry and therapy.   Work/school performance:  N/A Difficulty sustaining attention/completing tasks: yes Distracted by extraneous stimuli: yes Does not listen when spoken to: at times  Fidgets with hands or feet: yes Unable to stay in seat: yes Blurts out/interrupts others: no ADHD Medication Side Effects: no current medications    Decreased appetite: no    Headache: no    Sleeping disturbance pattern: yes    Irritability: no    Anxiousness: yes    Dizziness: no    Tics: no  Relevant past medical, surgical, family and social history reviewed and updated as indicated. Interim medical history since our last visit reviewed. Allergies and medications reviewed and updated.  Review of Systems  Constitutional: Negative for activity change, appetite change, diaphoresis, fatigue and fever.  Respiratory: Negative for cough, chest tightness and shortness of breath.   Cardiovascular: Negative for chest pain, palpitations and leg swelling.  Gastrointestinal: Negative.   Endocrine: Negative for polydipsia, polyphagia and polyuria.  Neurological: Negative.   Psychiatric/Behavioral: Positive for decreased concentration and sleep disturbance. Negative for self-injury and suicidal ideas. The patient is nervous/anxious (  occasional).     Per HPI unless specifically indicated above     Objective:    There were no vitals taken for this visit.  Wt Readings from Last 3 Encounters:  04/28/19 154 lb (69.9 kg)  04/22/19 150 lb (68 kg)  03/26/19 154 lb (69.9 kg)    Physical Exam   Unable to perform due to telephone visit only.  Results for orders placed or performed in visit on 05/26/19  Comprehensive  metabolic panel  Result Value Ref Range   Glucose 107 (H) 65 - 99 mg/dL   BUN 11 8 - 27 mg/dL   Creatinine, Ser 0.67 0.57 - 1.00 mg/dL   GFR calc non Af Amer 86 >59 mL/min/1.73   GFR calc Af Amer 99 >59 mL/min/1.73   BUN/Creatinine Ratio 16 12 - 28   Sodium 140 134 - 144 mmol/L   Potassium 4.3 3.5 - 5.2 mmol/L   Chloride 104 96 - 106 mmol/L   CO2 22 20 - 29 mmol/L   Calcium 9.7 8.7 - 10.3 mg/dL   Total Protein 6.7 6.0 - 8.5 g/dL   Albumin 4.3 3.7 - 4.7 g/dL   Globulin, Total 2.4 1.5 - 4.5 g/dL   Albumin/Globulin Ratio 1.8 1.2 - 2.2   Bilirubin Total 0.4 0.0 - 1.2 mg/dL   Alkaline Phosphatase 88 39 - 117 IU/L   AST 21 0 - 40 IU/L   ALT 24 0 - 32 IU/L  Lipid Panel w/o Chol/HDL Ratio out  Result Value Ref Range   Cholesterol, Total 150 100 - 199 mg/dL   Triglycerides 162 (H) 0 - 149 mg/dL   HDL 46 >39 mg/dL   VLDL Cholesterol Cal 28 5 - 40 mg/dL   LDL Chol Calc (NIH) 76 0 - 99 mg/dL  HgB A1c  Result Value Ref Range   Hgb A1c MFr Bld 5.8 (H) 4.8 - 5.6 %   Est. average glucose Bld gHb Est-mCnc 120 mg/dL      Assessment & Plan:   Problem List Items Addressed This Visit      Other   Situational anxiety    Ongoing and increased with recent stressors. Denies SI/HI.  Not taking Buspar 5 MG BID.  Would avoid benzo use due to age and risk for chronic use.  Could trial Sertraline daily if ongoing issues or increase dose Buspar, but patient refuses at this time.  Will place referral to psychiatry and therapy, which patient and son are agreeable to, for further work-up and evaluation possible ADHD.      Relevant Orders   Ambulatory referral to Psychiatry   Ambulatory referral to Psychology       Follow up plan: Return in about 3 months (around 11/05/2019) for Mood, HTN/HLD.

## 2019-08-05 NOTE — Patient Instructions (Signed)

## 2019-08-05 NOTE — Assessment & Plan Note (Signed)
Ongoing and increased with recent stressors. Denies SI/HI.  Not taking Buspar 5 MG BID.  Would avoid benzo use due to age and risk for chronic use.  Could trial Sertraline daily if ongoing issues or increase dose Buspar, but patient refuses at this time.  Will place referral to psychiatry and therapy, which patient and son are agreeable to, for further work-up and evaluation possible ADHD.

## 2019-08-06 ENCOUNTER — Encounter: Payer: Self-pay | Admitting: Nurse Practitioner

## 2019-08-06 ENCOUNTER — Telehealth: Payer: Self-pay | Admitting: Nurse Practitioner

## 2019-08-06 NOTE — Telephone Encounter (Signed)
Noted thank you

## 2019-08-06 NOTE — Telephone Encounter (Signed)
Called and spoke with patient. Message relayed. Patient stated she wants to stop taking her medications as she has been feeling well lately. Patient stated wanted to discuss this with the provider to request the same thing. Schedule virtual visit/telephone only for Tuesday, 08/11/19 at 9:30 am. Routing to provider as an [place]

## 2019-08-06 NOTE — Telephone Encounter (Signed)
Copied from [place] [phone]. Topic: General - Call Back - No Documentation >> Aug 06, 2019 11:58 AM Erick Blinks wrote: Reason for CRM: Pt called to report that she is interested in stopping her medications. She wants to get off of all of her medications minus her aspirin a day.  Best contact: [phone]

## 2019-08-06 NOTE — Telephone Encounter (Signed)
-----   Message from Venita Lick, NP sent at 08/05/2019  4:45 PM EDT ----- 3 month follow-up need

## 2019-08-06 NOTE — Telephone Encounter (Signed)
lvm to make this 3 month f/u sent letter.

## 2019-08-11 ENCOUNTER — Telehealth: Payer: Self-pay | Admitting: Nurse Practitioner

## 2019-08-11 ENCOUNTER — Encounter: Payer: Self-pay | Admitting: Nurse Practitioner

## 2019-08-11 ENCOUNTER — Ambulatory Visit (INDEPENDENT_AMBULATORY_CARE_PROVIDER_SITE_OTHER): Payer: Medicare Other | Admitting: Nurse Practitioner

## 2019-08-11 DIAGNOSIS — F418 Other specified anxiety disorders: Secondary | ICD-10-CM | POA: Diagnosis not present

## 2019-08-11 DIAGNOSIS — I6522 Occlusion and stenosis of left carotid artery: Secondary | ICD-10-CM | POA: Diagnosis not present

## 2019-08-11 NOTE — Telephone Encounter (Signed)
Called pt to scheduled f/u around 5/4 for 6 week f/u, no answer, left vm, sending letter.

## 2019-08-11 NOTE — Assessment & Plan Note (Signed)
Ongoing and increased with recent stressors. Denies SI/HI.  Not taking Buspar 5 MG BID.  Would avoid benzo use due to age and risk for chronic use.  Could trial Sertraline daily if ongoing issues or increase dose Buspar, but patient refuses at this time.  Recent referral to psychiatry placed, they have not heard from them yet, they were provided number by nursing staff to call for visit with psychiatry and therapy at [facility].

## 2019-08-11 NOTE — Patient Instructions (Signed)
Fat and Cholesterol Restricted Eating Plan Getting too much fat and cholesterol in your diet may cause health problems. Choosing the right foods helps keep your fat and cholesterol at normal levels. This can keep you from getting certain diseases. Your doctor may recommend an eating plan that includes:  Total fat: ______% or less of total calories a day.  Saturated fat: ______% or less of total calories a day.  Cholesterol: less than _________mg a day.  Fiber: ______g a day. What are tips for following this plan? Meal planning  At meals, divide your plate into four equal parts: ? Fill one-half of your plate with vegetables and green salads. ? Fill one-fourth of your plate with whole grains. ? Fill one-fourth of your plate with low-fat (lean) protein foods.  Eat fish that is high in omega-3 fats at least two times a week. This includes mackerel, tuna, sardines, and salmon.  Eat foods that are high in fiber, such as whole grains, beans, apples, broccoli, carrots, peas, and barley. General tips   Work with your doctor to lose weight if you need to.  Avoid: ? Foods with added sugar. ? Fried foods. ? Foods with partially hydrogenated oils.  Limit alcohol intake to no more than 1 drink a day for nonpregnant women and 2 drinks a day for men. One drink equals 12 oz of beer, 5 oz of wine, or 1 oz of hard liquor. Reading food labels  Check food labels for: ? Trans fats. ? Partially hydrogenated oils. ? Saturated fat (g) in each serving. ? Cholesterol (mg) in each serving. ? Fiber (g) in each serving.  Choose foods with healthy fats, such as: ? Monounsaturated fats. ? Polyunsaturated fats. ? Omega-3 fats.  Choose grain products that have whole grains. Look for the word "whole" as the first word in the ingredient list. Cooking  Cook foods using low-fat methods. These include baking, boiling, grilling, and broiling.  Eat more home-cooked foods. Eat at restaurants and buffets  less often.  Avoid cooking using saturated fats, such as butter, cream, palm oil, palm kernel oil, and coconut oil. Recommended foods  Fruits  All fresh, canned (in natural juice), or frozen fruits. Vegetables  Fresh or frozen vegetables (raw, steamed, roasted, or grilled). Green salads. Grains  Whole grains, such as whole wheat or whole grain breads, crackers, cereals, and pasta. Unsweetened oatmeal, bulgur, barley, quinoa, or brown rice. Corn or whole wheat flour tortillas. Meats and other protein foods  Ground beef (85% or leaner), grass-fed beef, or beef trimmed of fat. Skinless chicken or turkey. Ground chicken or turkey. Pork trimmed of fat. All fish and seafood. Egg whites. Dried beans, peas, or lentils. Unsalted nuts or seeds. Unsalted canned beans. Nut butters without added sugar or oil. Dairy  Low-fat or nonfat dairy products, such as skim or 1% milk, 2% or reduced-fat cheeses, low-fat and fat-free ricotta or cottage cheese, or plain low-fat and nonfat yogurt. Fats and oils  Tub margarine without trans fats. Light or reduced-fat mayonnaise and salad dressings. Avocado. Olive, canola, sesame, or safflower oils. The items listed above may not be a complete list of foods and beverages you can eat. Contact a dietitian for more information. Foods to avoid Fruits  Canned fruit in heavy syrup. Fruit in cream or butter sauce. Fried fruit. Vegetables  Vegetables cooked in cheese, cream, or butter sauce. Fried vegetables. Grains  White bread. White pasta. White rice. Cornbread. Bagels, pastries, and croissants. Crackers and snack foods that contain trans fat   and hydrogenated oils. Meats and other protein foods  Fatty cuts of meat. Ribs, chicken wings, bacon, sausage, bologna, salami, chitterlings, fatback, hot dogs, bratwurst, and packaged lunch meats. Liver and organ meats. Whole eggs and egg yolks. Chicken and turkey with skin. Fried meat. Dairy  Whole or 2% milk, cream,  half-and-half, and cream cheese. Whole milk cheeses. Whole-fat or sweetened yogurt. Full-fat cheeses. Nondairy creamers and whipped toppings. Processed cheese, cheese spreads, and cheese curds. Beverages  Alcohol. Sugar-sweetened drinks such as sodas, lemonade, and fruit drinks. Fats and oils  Butter, stick margarine, lard, shortening, ghee, or bacon fat. Coconut, palm kernel, and palm oils. Sweets and desserts  Corn syrup, sugars, honey, and molasses. Candy. Jam and jelly. Syrup. Sweetened cereals. Cookies, pies, cakes, donuts, muffins, and ice cream. The items listed above may not be a complete list of foods and beverages you should avoid. Contact a dietitian for more information. Summary  Choosing the right foods helps keep your fat and cholesterol at normal levels. This can keep you from getting certain diseases.  At meals, fill one-half of your plate with vegetables and green salads.  Eat high-fiber foods, like whole grains, beans, apples, carrots, peas, and barley.  Limit added sugar, saturated fats, alcohol, and fried foods. This information is not intended to replace advice given to you by your health care provider. Make sure you discuss any questions you have with your health care provider. Document Revised: 01/08/2018 Document Reviewed: 01/22/2017 Elsevier Patient Education  2020 Elsevier Inc.  

## 2019-08-11 NOTE — Assessment & Plan Note (Signed)
With history of stent placement, has not followed up with vascular.  Have HIGHLY recommended she not discontinue all medications to include statin, ASA, Plavix, and Amlodipine.  Discussed at length risk of stroke or heart event without medications on board, even if she does feel good at this time.  She wishes to follow-up with vascular and further discuss with them.  Her son plans to call and schedule follow-up.  Will reach out to CCM team and patient reports she may need help with transportation.

## 2019-08-11 NOTE — Progress Notes (Signed)
Ht 5\' 3"  (1.6 m)   Wt 155 lb (70.3 kg)   BMI 27.46 kg/m    Subjective:    Patient ID: Tina Hardy, female    DOB: 07/14/1940, 79 y.o.   MRN: [id number]  HPI: Ilena Koby is a 79 y.o. female  Chief Complaint  Patient presents with  . Follow-up    pt states she wants to discuss about stopping medication. pt states that she has been off medications x 4 days ago now  . Concentration    pt would like to have something to help her with concentration and focus    . This visit was completed via telephone due to the restrictions of the COVID-19 pandemic. All issues as above were discussed and addressed but no physical exam was performed. If it was felt that the patient should be evaluated in the office, they were directed there. The patient verbally consented to this visit. Patient was unable to complete an audio/visual visit due to Technical difficulties,Lack of internet. Due to the catastrophic nature of the COVID-19 pandemic, this visit was done through audio contact only. . Location of the patient: home . Location of the provider: work . Those involved with this call:  . Provider: Marnee Guarneri, DNP . CMA: Yvonna Alanis, CMA . Front Desk/Registration: Don Perking  . Time spent on call: 15 minutes on the phone discussing health concerns. 10 minutes total spent in review of patient's record and preparation of their chart.  . I verified patient identity using two factors (patient name and date of birth). Patient consents verbally to being seen via telemedicine visit today.   [name] son Angelica Chessman present at telephone visit per patient request.   MOOD: Last visit her son and her had requested Adderall for concentration, reporting patient with lots of anxiety and hard time concentrating.  She is currently on Buspar as needed for anxiety, but is not taking.  In past has requested Xanax, but have discussed risks of this medication group with her age and BEERS  criteria.  She had reported she did not want to take a daily medication, like SSRI.  At last visit placed referral to psychiatry and psychology on 08/05/2019, they have not heard from them as of yet.  HYPERLIPIDEMIA Wishing to stop all medication except ASA, Plavix, Amlodipine, Atorvastatin.  Actually has stopped them over past week and reports she is feeling better without them.  Has history of stenosis left carotid with 90% -- stent placement by Dr. Delana Meyer on 04/22/2019. She was to return to see them in 3 weeks, but did not return.   Discussed at length with her need to continue on regimen and explained risk for stroke or heart event without ASA, statin, or Plavix on board.  Highly recommended she follow-up with her vascular provider as she had been instructed to.  Her son and her both agree to follow-up with vascular and her son attempted to reiterate what PCP was saying and encourage her to continue medications. Hyperlipidemia status: poor compliance Satisfied with current treatment?  no Side effects:  no Medication compliance: poor compliance Past cholesterol meds: atorvastain (lipitor) Supplements: none Aspirin:  no The 10-year ASCVD risk score Mikey Bussing DC Jr., et al., 2013) is: 26.6%   Values used to calculate the score:     Age: 37 years     Sex: Female     Is Non-Hispanic African American: No     Diabetic: No     Tobacco smoker: No  Systolic Blood Pressure: [id number] mmHg     Is BP treated: Yes     HDL Cholesterol: 46 mg/dL     Total Cholesterol: 150 mg/dL Chest pain:  no Coronary artery disease:  yes Family history CAD:  no Family history early CAD:  no  Relevant past medical, surgical, family and social history reviewed and updated as indicated. Interim medical history since our last visit reviewed. Allergies and medications reviewed and updated.  Review of Systems  Constitutional: Negative for activity change, appetite change, diaphoresis, fatigue and fever.  Respiratory:  Negative for cough, chest tightness and shortness of breath.   Cardiovascular: Negative for chest pain, palpitations and leg swelling.  Gastrointestinal: Negative.   Endocrine: Negative for cold intolerance, heat intolerance, polydipsia, polyphagia and polyuria.  Neurological: Negative.   Psychiatric/Behavioral: Negative.     Per HPI unless specifically indicated above     Objective:    Ht 5\' 3"  (1.6 m)   Wt 155 lb (70.3 kg)   BMI 27.46 kg/m   Wt Readings from Last 3 Encounters:  08/11/19 155 lb (70.3 kg)  04/28/19 154 lb (69.9 kg)  04/22/19 150 lb (68 kg)    Physical Exam   Unable to perform due to telephone visit only.  Results for orders placed or performed in visit on 05/26/19  Comprehensive metabolic panel  Result Value Ref Range   Glucose 107 (H) 65 - 99 mg/dL   BUN 11 8 - 27 mg/dL   Creatinine, Ser 0.67 0.57 - 1.00 mg/dL   GFR calc non Af Amer 86 >59 mL/min/1.73   GFR calc Af Amer 99 >59 mL/min/1.73   BUN/Creatinine Ratio 16 12 - 28   Sodium 140 134 - 144 mmol/L   Potassium 4.3 3.5 - 5.2 mmol/L   Chloride 104 96 - 106 mmol/L   CO2 22 20 - 29 mmol/L   Calcium 9.7 8.7 - 10.3 mg/dL   Total Protein 6.7 6.0 - 8.5 g/dL   Albumin 4.3 3.7 - 4.7 g/dL   Globulin, Total 2.4 1.5 - 4.5 g/dL   Albumin/Globulin Ratio 1.8 1.2 - 2.2   Bilirubin Total 0.4 0.0 - 1.2 mg/dL   Alkaline Phosphatase 88 39 - 117 IU/L   AST 21 0 - 40 IU/L   ALT 24 0 - 32 IU/L  Lipid Panel w/o Chol/HDL Ratio out  Result Value Ref Range   Cholesterol, Total 150 100 - 199 mg/dL   Triglycerides 162 (H) 0 - 149 mg/dL   HDL 46 >39 mg/dL   VLDL Cholesterol Cal 28 5 - 40 mg/dL   LDL Chol Calc (NIH) 76 0 - 99 mg/dL  HgB A1c  Result Value Ref Range   Hgb A1c MFr Bld 5.8 (H) 4.8 - 5.6 %   Est. average glucose Bld gHb Est-mCnc 120 mg/dL      Assessment & Plan:   Problem List Items Addressed This Visit      Cardiovascular and Mediastinum   Stenosis of left carotid artery    With history of stent  placement, has not followed up with vascular.  Have HIGHLY recommended she not discontinue all medications to include statin, ASA, Plavix, and Amlodipine.  Discussed at length risk of stroke or heart event without medications on board, even if she does feel good at this time.  She wishes to follow-up with vascular and further discuss with them.  Her son plans to call and schedule follow-up.  Will reach out to CCM team and patient reports she  may need help with transportation.        Other   Situational anxiety    Ongoing and increased with recent stressors. Denies SI/HI.  Not taking Buspar 5 MG BID.  Would avoid benzo use due to age and risk for chronic use.  Could trial Sertraline daily if ongoing issues or increase dose Buspar, but patient refuses at this time.  Recent referral to psychiatry placed, they have not heard from them yet, they were provided number by nursing staff to call for visit with psychiatry and therapy at [facility].           I discussed the assessment and treatment plan with the patient. The patient was provided an opportunity to ask questions and all were answered. The patient agreed with the plan and demonstrated an understanding of the instructions.   The patient was advised to call back or seek an in-person evaluation if the symptoms worsen or if the condition fails to improve as anticipated.   I provided 15+ minutes of time during this encounter.  Follow up plan: Return in about 6 weeks (around 09/22/2019) for HLD, MOOD.

## 2019-08-12 ENCOUNTER — Ambulatory Visit (INDEPENDENT_AMBULATORY_CARE_PROVIDER_SITE_OTHER): Payer: Medicare Other | Admitting: Licensed Clinical Social Worker

## 2019-08-12 DIAGNOSIS — Z748 Other problems related to care provider dependency: Secondary | ICD-10-CM

## 2019-08-12 DIAGNOSIS — F418 Other specified anxiety disorders: Secondary | ICD-10-CM

## 2019-08-12 DIAGNOSIS — E782 Mixed hyperlipidemia: Secondary | ICD-10-CM

## 2019-08-12 DIAGNOSIS — F1290 Cannabis use, unspecified, uncomplicated: Secondary | ICD-10-CM

## 2019-08-12 DIAGNOSIS — I10 Essential (primary) hypertension: Secondary | ICD-10-CM

## 2019-08-12 NOTE — Telephone Encounter (Signed)
   KNB 08/12/2019 Spoke with pt's son regarding financial assistance  Name: Tina Hardy   MRN: [id number]   DOB: Sep 29, 1940   AGE: 79 y.o.   GENDER: female   PCP Venita Lick, NP.   Called pt regarding [employer] Referral for financial assistance. He stated that he will be traveling to [facility] to get to dealership to get fab ordered for their [employer] as it is not working. Believes it will be $340 to order part. Pt's son Follow up on: 08/13/19  [place] . [email or web address]  [phone]

## 2019-08-12 NOTE — Telephone Encounter (Signed)
c 

## 2019-08-12 NOTE — Chronic Care Management (AMB) (Signed)
Chronic Care Management    Clinical Social Work Follow Up Note  08/12/2019 Name: Tenna Dinwiddie MRN: [id number] DOB: 1940-10-27  Tina Hardy is a 79 y.o. year old female who is a primary care patient of Cannady, Barbaraann Faster, NP. The CCM team was consulted for assistance with Mental Health Counseling and Resources.   Review of patient status, including review of consultants reports, other relevant assessments, and collaboration with appropriate care team members and the patient's provider was performed as part of comprehensive patient evaluation and provision of chronic care management services.    SDOH (Social Determinants of Health) assessments performed: Yes    Outpatient Encounter Medications as of 08/12/2019  Medication Sig Note  . amLODipine (NORVASC) 5 MG tablet Take 1 tablet (5 mg total) by mouth daily. (Patient not taking: Reported on 08/11/2019)   . aspirin EC 81 MG EC tablet Take 1 tablet (81 mg total) by mouth daily. (Patient not taking: Reported on 08/11/2019)   . atorvastatin (LIPITOR) 10 MG tablet Take 1 tablet (10 mg total) by mouth daily. (Patient not taking: Reported on 08/11/2019)   . busPIRone (BUSPAR) 5 MG tablet Take 1 tablet (5 mg total) by mouth 2 (two) times daily. (Patient not taking: Reported on 08/11/2019) 07/14/2019: Taking QAM  . clopidogrel (PLAVIX) 75 MG tablet Take 1 tablet (75 mg total) by mouth daily at 6 (six) AM. (Patient not taking: Reported on 08/11/2019)   . Cyanocobalamin (VITAMIN B-12 PO) Take 1 drop by mouth daily as needed (ENERGY SUPPORT).   . Multiple Vitamins-Minerals (ADULT GUMMY PO) Take 2 tablets by mouth daily. 07/29/2019: As needed    No facility-administered encounter medications on file as of 08/12/2019.     Goals Addressed    . "I struggle with anxiety." (pt-stated)       Current Barriers:  . Chronic Mental Health needs related to anxiety disorder . Mental Health Concerns  . Social Isolation . Transportation Barriers-unable to get to  appointments as her car no longer works. Patient has UHC Benefits . Suicidal Ideation/Homicidal Ideation: No  Clinical Social Work Goal(s):  Marland Kitchen Over the next 120 days, patient will work with SW  bi-monthly  by telephone or in person to reduce or manage symptoms related to anxiety and stress  . Over the next 120 days, patient will demonstrate improved health management independence as evidenced byimplementing appropriate anxiety management coping skills and self-care into her daily routine to combat future symptoms  Interventions: . Patient interviewed and appropriate assessments performed: brief mental health assessment . Provided mental health counseling with regard to anxiety. Patient was educated on coping skills to implement when anxiety is triggered.  . Discussed plans with patient for ongoing care management follow up and provided patient with direct contact information for care management team . Advised patient to contact [facility] to schedule appointment as referral has been completed.  Marland Kitchen Collaborated with C3 Guide re: *transportation assistance needs. [name] car is no longer working and she cannot get to her mental health appointments . Assisted patient/caregiver with obtaining information about health plan benefits . Provided education and assistance to client regarding Advanced Directives. . Patient was referred by PCP to [facility] for long term follow up and therapy/counseling . Emotional/Supportive Counseling provided during session. Patient was receptive to anxiety management education.  Patient Self Care Activities:  . Attends all scheduled appointments . Motivation for mental health treatment  Patient Coping Strengths:  . Spirituality . Hopefulness . Self Advocate . Able to Communicate Effectively  Patient Self Care Deficits:  . Lacks social connections  Initial goal documentation     Follow Up Plan: SW will follow up with patient by phone over the next  quarter  Eula Fried, [place], MSW, [email or web address] Phone: [phone]

## 2019-08-13 ENCOUNTER — Telehealth: Payer: Self-pay | Admitting: Nurse Practitioner

## 2019-08-13 NOTE — Telephone Encounter (Signed)
Email to pt's son   From: Jill Alexanders [facility])  Sent: Thursday, August 13, 2019 4:51 PM To: '[email or web address]> Subject: Secure: Community Resources for Transportation  Good Afternoon Mr. Siebrandt,  Please see attached information regarding [employer] benefit from your mother's [employer]. Please let me know if you have any questions regarding setting up the trip for your mother's appt next Wed. I've attached the appt reminders as well. Please have the insurance card available when you call.  [facility] National: [phone]  Let me know if you need anything further,   [place] . [email or web address]  [phone]

## 2019-08-13 NOTE — Telephone Encounter (Signed)
   KNB 08/13/2019 Name: Tina Hardy   MRN: [id number]   DOB: 13-Jan-1941   AGE: 79 y.o.   GENDER: female   PCP Venita Lick, NP.   Called pt's son regarding Data processing manager Referral for transportation. He stated that he is hoping to get some things lined up financially to be able to afford to fix the car. I told him about the [employer] option for his mom through her insurance and he said he can line it up for her appt next week to vascular. I told him that she can use it toward any doc appt or specialist.  He asked that I email him the information. CG to email logisticare hand out and appt reminders for his reference. Closing referral pending any other needs of patient.  [place] . [email or web address]  [phone]

## 2019-08-13 NOTE — Telephone Encounter (Signed)
Closing referral pending any other needs of patient.     

## 2019-08-18 ENCOUNTER — Other Ambulatory Visit (INDEPENDENT_AMBULATORY_CARE_PROVIDER_SITE_OTHER): Payer: Self-pay | Admitting: Vascular Surgery

## 2019-08-18 DIAGNOSIS — Z95820 Peripheral vascular angioplasty status with implants and grafts: Secondary | ICD-10-CM

## 2019-08-18 DIAGNOSIS — I6522 Occlusion and stenosis of left carotid artery: Secondary | ICD-10-CM

## 2019-08-18 DIAGNOSIS — G459 Transient cerebral ischemic attack, unspecified: Secondary | ICD-10-CM

## 2019-08-19 ENCOUNTER — Ambulatory Visit (INDEPENDENT_AMBULATORY_CARE_PROVIDER_SITE_OTHER): Payer: Medicare Other

## 2019-08-19 ENCOUNTER — Ambulatory Visit: Payer: Self-pay | Admitting: Pharmacist

## 2019-08-19 ENCOUNTER — Ambulatory Visit (INDEPENDENT_AMBULATORY_CARE_PROVIDER_SITE_OTHER): Payer: Medicare Other | Admitting: Nurse Practitioner

## 2019-08-19 DIAGNOSIS — I6522 Occlusion and stenosis of left carotid artery: Secondary | ICD-10-CM

## 2019-08-19 DIAGNOSIS — E782 Mixed hyperlipidemia: Secondary | ICD-10-CM

## 2019-08-19 DIAGNOSIS — I10 Essential (primary) hypertension: Secondary | ICD-10-CM

## 2019-08-19 DIAGNOSIS — F418 Other specified anxiety disorders: Secondary | ICD-10-CM

## 2019-08-19 NOTE — Patient Instructions (Addendum)
Tina Hardy and Tina Hardy,   Here are the phone numbers we discussed:   To replace a [employer] ID card: [phone]   To schedule [employer] transportation: [phone]   [facility]: [phone];  [address], [place], [place] [address]  [place]: [phone]; [address] [place], [place] [address]  [facility] [phone]; [place], [place], [place] [address]   Her medications are as follows:   Blood pressure: amlodipine Cholesterol: atorvastatin Preventing an artery blockage: aspirin, clopidogrel Anxiety: buspirone   Please utilize the above resources. Call with any questions!  Catie Darnelle Maffucci, PharmD [phone]  Visit Information  Goals Addressed            This Visit's Progress     Patient Stated   . PharmD "My medications are expensive" (pt-stated)       CARE PLAN ENTRY (see longtitudinal plan of care for additional care plan information)  Current Barriers:  . Polypharmacy; complex patient with multiple comorbidities including HTN, hx TIA, stenosis of L carotid artery . Lives with her son, Tina Hardy, in a home owned by her niece, Tina Hardy. Appears Care Guide attempted to outreach patient's niece, has not heard back . Self-manages medications. Reported to [employer] last week that she stopped all of her medications and was "feeling great". Adamant today that she "takes everything every day", but declines to individually review everything or discuss adherence strategies. Son, Tina Hardy, does review the bottles with me.  Tina Hardy noted that they have no transportation to Vascular appointment today. He notes that he did not receive the email with the phone number for [facility] transportation, and that his mother does not have a [place] card.  . Most recent eGFR: 25 o HTN: amlodipine 5 mg daily  o HLD: atorvastatin 10 mg daily o Aortic valve sclerosis s/p stenting 04/21/20: aspirin 81 mg daily,  clopidogrel 75 mg daily; She was to have f/u with vascular about 3 weeks post stenting, but this appointment was not scheduled.  o Anxiety: buspirone 5 mg BID. Notes that she has been taking this medication twice daily. Has not called [facility] to schedule an appointment.  Pharmacist Clinical Goal(s):  Marland Kitchen Over the next 90 days, patient will work with PharmD and provider towards optimized medication management  Interventions: . Comprehensive medication review performed; medication list updated in electronic medical record . Provided phone number for [facility] and [place]. Tina Hardy rescheduled Vascular appointment, and is going to call to set up transportation. He is also going to call to ask for a new [employer] card . Reiterated the importance of each medication for stroke prevention. Mailing list of medications w/ indication . Provided phone number for [employer]. Encouraged they call to set up an appointment. Tina Hardy expressed mixed emotions about more medications being added, but we discussed the importance of long term therapy as well. He verbalized understanding . Mailing all of the above contact information to the patient.  Patient Self Care Activities:  . Patient will take medications as prescribed  Please see past updates related to this goal by clicking on the "Past Updates" button in the selected goal         Patient verbalizes understanding of instructions provided today.   Plan:  - Scheduled f/u call 10/28/19  Catie Darnelle Maffucci, PharmD, [place] [phone]

## 2019-08-19 NOTE — Chronic Care Management (AMB) (Signed)
Chronic Care Management   Follow Up Note   08/19/2019 Name: Tina Hardy MRN: [id number] DOB: 08/31/1940  Referred by: Tina Hardy Reason for referral : Chronic Care Management (Medication Management)   Tina Hardy is a 79 y.o. year old female who is a primary care patient of Cannady, Tina Hardy. The CCM team was consulted for assistance with chronic disease management and care coordination needs.    Contacted patient and son for medication management review.  Review of patient status, including review of consultants reports, relevant laboratory and other test results, and collaboration with appropriate care team members and the patient's provider was performed as part of comprehensive patient evaluation and provision of chronic care management services.    SDOH (Social Determinants of Health) assessments performed: Yes See Care Plan activities for detailed interventions related to [facility])     Outpatient Encounter Medications as of 08/19/2019  Medication Sig Note  . amLODipine (NORVASC) 5 MG tablet Take 1 tablet (5 mg total) by mouth daily.   Marland Kitchen aspirin EC 81 MG EC tablet Take 1 tablet (81 mg total) by mouth daily.   Marland Kitchen atorvastatin (LIPITOR) 10 MG tablet Take 1 tablet (10 mg total) by mouth daily.   . busPIRone (BUSPAR) 5 MG tablet Take 1 tablet (5 mg total) by mouth 2 (two) times daily.   . clopidogrel (PLAVIX) 75 MG tablet Take 1 tablet (75 mg total) by mouth daily at 6 (six) AM.   . Cyanocobalamin (VITAMIN B-12 PO) Take 1 drop by mouth daily as needed (ENERGY SUPPORT).   . Multiple Vitamins-Minerals (ADULT GUMMY PO) Take 2 tablets by mouth daily. 07/29/2019: As needed    No facility-administered encounter medications on file as of 08/19/2019.     Objective:   Goals Addressed            This Visit's Progress     Patient Stated   . PharmD "My medications are expensive" (pt-stated)       CARE PLAN ENTRY (see longtitudinal plan of care for additional care  plan information)  Current Barriers:  . Polypharmacy; complex patient with multiple comorbidities including HTN, hx TIA, stenosis of L carotid artery . Lives with her son, Tina Hardy, in a home owned by her niece, Tina Hardy. Appears Care Guide attempted to outreach patient's niece, has not heard back . Self-manages medications. Reported to [employer] last week that she stopped all of her medications and was "feeling great". Adamant today that she "takes everything every day", but declines to individually review everything or discuss adherence strategies. Son, Tina Hardy, does review the bottles with me.  Tina Hardy noted that they have no transportation to Vascular appointment today. He notes that he did not receive the email with the phone number for [facility] transportation, and that his mother does not have a [place] card.  . Most recent eGFR: 47 o HTN: amlodipine 5 mg daily  o HLD: atorvastatin 10 mg daily o Aortic valve sclerosis s/p stenting 04/21/20: aspirin 81 mg daily, clopidogrel 75 mg daily; She was to have f/u with vascular about 3 weeks post stenting, but this appointment was not scheduled.  o Anxiety: buspirone 5 mg BID. Notes that she has been taking this medication twice daily. Has not called [facility] to schedule an appointment.  Pharmacist Clinical Goal(s):  Marland Kitchen Over the next 90 days, patient will work with PharmD and provider towards optimized medication management  Interventions: . Comprehensive medication review performed; medication list updated in electronic medical record .  Provided phone number for [facility] and [place]. Tina Hardy rescheduled Vascular appointment, and is going to call to set up transportation. He is also going to call to ask for a new [employer] card . Reiterated the importance of each medication for stroke prevention. Mailing list of medications w/ indication . Provided phone number for [employer]. Encouraged they call to set up  an appointment. Tina Hardy expressed mixed emotions about more medications being added, but we discussed the importance of long term therapy as well. He verbalized understanding . Mailing all of the above contact information to the patient.  Patient Self Care Activities:  . Patient will take medications as prescribed  Please see past updates related to this goal by clicking on the "Past Updates" button in the selected goal          Plan:  - Scheduled f/u call 10/28/19  Catie Darnelle Maffucci, PharmD, [place] [phone]

## 2019-08-31 ENCOUNTER — Ambulatory Visit (INDEPENDENT_AMBULATORY_CARE_PROVIDER_SITE_OTHER): Payer: Medicare Other

## 2019-08-31 ENCOUNTER — Encounter (INDEPENDENT_AMBULATORY_CARE_PROVIDER_SITE_OTHER): Payer: Self-pay | Admitting: Nurse Practitioner

## 2019-08-31 ENCOUNTER — Ambulatory Visit (INDEPENDENT_AMBULATORY_CARE_PROVIDER_SITE_OTHER): Payer: Medicare Other | Admitting: Nurse Practitioner

## 2019-08-31 ENCOUNTER — Other Ambulatory Visit: Payer: Self-pay

## 2019-08-31 VITALS — BP 165/81 | HR 80 | Ht 65.0 in | Wt 152.0 lb

## 2019-08-31 DIAGNOSIS — E782 Mixed hyperlipidemia: Secondary | ICD-10-CM

## 2019-08-31 DIAGNOSIS — I6522 Occlusion and stenosis of left carotid artery: Secondary | ICD-10-CM | POA: Diagnosis not present

## 2019-08-31 DIAGNOSIS — G459 Transient cerebral ischemic attack, unspecified: Secondary | ICD-10-CM | POA: Diagnosis not present

## 2019-08-31 DIAGNOSIS — I10 Essential (primary) hypertension: Secondary | ICD-10-CM | POA: Diagnosis not present

## 2019-08-31 DIAGNOSIS — Z95820 Peripheral vascular angioplasty status with implants and grafts: Secondary | ICD-10-CM | POA: Diagnosis not present

## 2019-09-01 ENCOUNTER — Encounter (INDEPENDENT_AMBULATORY_CARE_PROVIDER_SITE_OTHER): Payer: Self-pay | Admitting: Nurse Practitioner
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
## 2019-09-24 ENCOUNTER — Telehealth: Payer: Medicare Other | Admitting: Nurse Practitioner

## 2019-09-25 ENCOUNTER — Telehealth: Payer: Self-pay

## 2019-09-25 ENCOUNTER — Ambulatory Visit: Payer: Self-pay | Admitting: General Practice

## 2019-09-25 NOTE — Chronic Care Management (AMB) (Signed)
  Chronic Care Management   Outreach Note  09/25/2019 Name: Tina Hardy MRN: [id number] DOB: [birth year]  Referred by: Venita Lick, NP Reason for referral : Chronic Care Management (Initial outreach for [facility] Chronic Disease Management and care coordination needs )   An unsuccessful telephone outreach was attempted today. The patient was referred to the case management team for assistance with care management and care coordination.   Follow Up Plan: A HIPPA compliant phone message was left for the patient providing contact information and requesting a return call.   Noreene Larsson RN, MSN, [facility] Mobile: [phone]

## 2019-09-28 ENCOUNTER — Encounter: Payer: Self-pay | Admitting: Nurse Practitioner

## 2019-09-28 ENCOUNTER — Other Ambulatory Visit: Payer: Self-pay

## 2019-09-28 ENCOUNTER — Ambulatory Visit (INDEPENDENT_AMBULATORY_CARE_PROVIDER_SITE_OTHER): Payer: Medicare Other | Admitting: Nurse Practitioner

## 2019-09-28 VITALS — BP 124/76 | HR 99 | Temp 98.0°F | Wt 158.8 lb

## 2019-09-28 DIAGNOSIS — R7309 Other abnormal glucose: Secondary | ICD-10-CM | POA: Diagnosis not present

## 2019-09-28 DIAGNOSIS — I10 Essential (primary) hypertension: Secondary | ICD-10-CM

## 2019-09-28 DIAGNOSIS — E782 Mixed hyperlipidemia: Secondary | ICD-10-CM | POA: Diagnosis not present

## 2019-09-28 DIAGNOSIS — F418 Other specified anxiety disorders: Secondary | ICD-10-CM

## 2019-09-28 DIAGNOSIS — F1290 Cannabis use, unspecified, uncomplicated: Secondary | ICD-10-CM

## 2019-09-28 DIAGNOSIS — I6522 Occlusion and stenosis of left carotid artery: Secondary | ICD-10-CM

## 2019-09-28 NOTE — Assessment & Plan Note (Signed)
Chronic, stable with BP at goal.  Continue current medication regimen and adjust as needed.  BMP today.  Recommend checking BP at home regularly.  Return in 6 months.

## 2019-09-28 NOTE — Progress Notes (Signed)
BP 124/76 (BP Location: Left Arm, Cuff Size: Normal)   Pulse 99   Temp 98 F (36.7 C) (Oral)   Wt 158 lb 12.8 oz (72 kg)   SpO2 98%   BMI 26.43 kg/m    Subjective:    Patient ID: Tina Hardy, female    DOB: Jan 04, 1941, 79 y.o.   MRN: [id number]  HPI: Tina Hardy is a 79 y.o. female  Chief Complaint  Patient presents with  . Anxiety  . Hyperlipidemia   ANXIETY/STRESS Not taking Buspirone.  Continues to eat "edible" in morning, this brings her calm. Anxious mood: no  Excessive worrying: no Irritability: no  Sweating: no Nausea: no Palpitations:no Hyperventilation: no Panic attacks: no Agoraphobia: no  Obscessions/compulsions: no Depressed mood: no Depression screen [facility] 2/9 08/11/2019 08/05/2019 07/29/2019 03/23/2019 09/25/2017  Decreased Interest 0 0 0 0 0  Down, Depressed, Hopeless 0 1 0 0 1  PHQ - 2 Score 0 1 0 0 1  Altered sleeping 0 0 - 1 -  Tired, decreased energy 0 0 - 0 -  Change in appetite 0 0 - 0 -  Feeling bad or failure about yourself  0 0 - 0 -  Trouble concentrating 0 0 - 0 -  Moving slowly or fidgety/restless 0 0 - 0 -  Suicidal thoughts 0 0 - 0 -  PHQ-9 Score 0 1 - 1 -  Difficult doing work/chores - - - Not difficult at all -   Anhedonia: no Weight changes: no Insomnia: yes hard to fall asleep  Hypersomnia: no Fatigue/loss of energy: no Feelings of worthlessness: no Feelings of guilt: no Impaired concentration/indecisiveness: yes Suicidal ideations: no  Crying spells: no Recent Stressors/Life Changes: no   Relationship problems: no   Family stress: no     Financial stress: no    Job stress: no    Recent death/loss: no GAD 7 : Generalized Anxiety Score 09/28/2019 08/11/2019 05/26/2019 04/24/2019  Nervous, Anxious, on Edge 0 0 0 1  Control/stop worrying 0 0 0 0  Worry too much - different things 0 0 0 0  Trouble relaxing 0 0 0 1  Restless 0 0 0 1  Easily annoyed or irritable 0 0 0 0  Afraid - awful might happen 0 0 0 0  Total GAD 7  Score 0 0 0 3  Anxiety Difficulty Not difficult at all - Not difficult at all Not difficult at all   HYPERLIPIDEMIA/HTN Continues on Atorvastatin 10 MG and Amlodipine 5 MG + AS & Plavix.  In September had CT of head which was unremarkable and MRI which showed chronic cerebellar infarct and chronic microvascular changes, with no acute stroke. Had stent placed left carotid in December 2020.  Last saw vascular on 08/31/2019.  Recent January labs showed A1C -- 5.8%, CRT 0.67, and GFR 86, LDL 76. Hyperlipidemia status: good compliance Satisfied with current treatment?  yes Side effects:  no Medication compliance: good compliance Past cholesterol meds: Lipitor Supplements: none Aspirin:  yes The 10-year ASCVD risk score Mikey Bussing DC Jr., et al., 2013) is: 23.8%   Values used to calculate the score:     Age: 35 years     Sex: Female     Is Non-Hispanic African American: No     Diabetic: No     Tobacco smoker: No     Systolic Blood Pressure: [id number] mmHg     Is BP treated: Yes     HDL Cholesterol: 46 mg/dL     Total  Cholesterol: 150 mg/dL  Relevant past medical, surgical, family and social history reviewed and updated as indicated. Interim medical history since our last visit reviewed. Allergies and medications reviewed and updated.  Review of Systems  Constitutional: Negative for activity change, appetite change, diaphoresis, fatigue and fever.  Respiratory: Negative for cough, chest tightness and shortness of breath.   Cardiovascular: Negative for chest pain, palpitations and leg swelling.  Gastrointestinal: Negative.   Neurological: Negative.   Psychiatric/Behavioral: Negative.     Per HPI unless specifically indicated above     Objective:    BP 124/76 (BP Location: Left Arm, Cuff Size: Normal)   Pulse 99   Temp 98 F (36.7 C) (Oral)   Wt 158 lb 12.8 oz (72 kg)   SpO2 98%   BMI 26.43 kg/m   Wt Readings from Last 3 Encounters:  09/28/19 158 lb 12.8 oz (72 kg)  08/31/19 152 lb  (68.9 kg)  08/11/19 155 lb (70.3 kg)    Physical Exam Vitals and nursing note reviewed.  Constitutional:      General: She is awake. She is not in acute distress.    Appearance: She is well-developed and well-groomed. She is not ill-appearing.  HENT:     Head: Normocephalic.     Right Ear: Hearing normal.     Left Ear: Hearing normal.  Eyes:     General: Lids are normal.        Right eye: No discharge.        Left eye: No discharge.     Conjunctiva/sclera: Conjunctivae normal.     Pupils: Pupils are equal, round, and reactive to light.  Neck:     Thyroid: No thyromegaly.     Vascular: No carotid bruit.  Cardiovascular:     Rate and Rhythm: Normal rate and regular rhythm.     Heart sounds: Normal heart sounds. No murmur. No gallop.   Pulmonary:     Effort: Pulmonary effort is normal. No accessory muscle usage or respiratory distress.     Breath sounds: Normal breath sounds.  Abdominal:     General: Bowel sounds are normal.     Palpations: Abdomen is soft.  Musculoskeletal:     Cervical back: Normal range of motion and neck supple.     Right lower leg: No edema.     Left lower leg: No edema.  Skin:    General: Skin is warm and dry.  Neurological:     Mental Status: She is alert and oriented to person, place, and time.  Psychiatric:        Attention and Perception: Attention normal.        Mood and Affect: Mood normal.        Speech: Speech normal.        Behavior: Behavior normal. Behavior is cooperative.        Thought Content: Thought content normal.     Results for orders placed or performed in visit on 05/26/19  Comprehensive metabolic panel  Result Value Ref Range   Glucose 107 (H) 65 - 99 mg/dL   BUN 11 8 - 27 mg/dL   Creatinine, Ser 0.67 0.57 - 1.00 mg/dL   GFR calc non Af Amer 86 >59 mL/min/1.73   GFR calc Af Amer 99 >59 mL/min/1.73   BUN/Creatinine Ratio 16 12 - 28   Sodium 140 134 - 144 mmol/L   Potassium 4.3 3.5 - 5.2 mmol/L   Chloride 104 96 - 106  mmol/L   CO2  22 20 - 29 mmol/L   Calcium 9.7 8.7 - 10.3 mg/dL   Total Protein 6.7 6.0 - 8.5 g/dL   Albumin 4.3 3.7 - 4.7 g/dL   Globulin, Total 2.4 1.5 - 4.5 g/dL   Albumin/Globulin Ratio 1.8 1.2 - 2.2   Bilirubin Total 0.4 0.0 - 1.2 mg/dL   Alkaline Phosphatase 88 39 - 117 IU/L   AST 21 0 - 40 IU/L   ALT 24 0 - 32 IU/L  Lipid Panel w/o Chol/HDL Ratio out  Result Value Ref Range   Cholesterol, Total 150 100 - 199 mg/dL   Triglycerides 162 (H) 0 - 149 mg/dL   HDL 46 >39 mg/dL   VLDL Cholesterol Cal 28 5 - 40 mg/dL   LDL Chol Calc (NIH) 76 0 - 99 mg/dL  HgB A1c  Result Value Ref Range   Hgb A1c MFr Bld 5.8 (H) 4.8 - 5.6 %   Est. average glucose Bld gHb Est-mCnc 120 mg/dL      Assessment & Plan:   Problem List Items Addressed This Visit      Cardiovascular and Mediastinum   Hypertension - Primary    Chronic, stable with BP at goal.  Continue current medication regimen and adjust as needed.  BMP today.  Recommend checking BP at home regularly.  Return in 6 months.      Relevant Orders   Basic metabolic panel   Stenosis of left carotid artery    Ongoing, continue collaboration with vascular + current antiplatelet regimen.        Other   Hyperlipidemia   Relevant Orders   Lipid Panel w/o Chol/HDL Ratio   Marijuana use    Reports occasional use.  Recommend cutting back on this.  Has not been using Buspar often, finds more benefit from [place]. Continue to recommend no use.      Situational anxiety    Ongoing and chronic. Denies SI/HI.  Occasional Buspar 5 MG BID use.  Would avoid benzo use due to age and risk for chronic use.  Could trial Sertraline daily if ongoing issues or increase dose Buspar, but patient refuses at this time.  Did not attend psychiatry, referral placed last visit.  Does not wish to attend at this time.      Elevated hemoglobin A1c    Recheck A1C today, no symptoms reported.      Relevant Orders   HgB A1c       Follow up plan: Return in about 6  months (around 03/30/2020) for HTN/HLD and MOOD.

## 2019-09-28 NOTE — Assessment & Plan Note (Signed)
Ongoing and chronic. Denies SI/HI.  Occasional Buspar 5 MG BID use.  Would avoid benzo use due to age and risk for chronic use.  Could trial Sertraline daily if ongoing issues or increase dose Buspar, but patient refuses at this time.  Did not attend psychiatry, referral placed last visit.  Does not wish to attend at this time.

## 2019-09-28 NOTE — Assessment & Plan Note (Signed)
Ongoing, continue collaboration with vascular + current antiplatelet regimen.

## 2019-09-28 NOTE — Assessment & Plan Note (Signed)
Reports occasional use.  Recommend cutting back on this.  Has not been using Buspar often, finds more benefit from [place]. Continue to recommend no use.

## 2019-09-28 NOTE — Assessment & Plan Note (Signed)
Recheck A1C today, no symptoms reported. 

## 2019-09-28 NOTE — Patient Instructions (Signed)
Preventing High Cholesterol Cholesterol is a white, waxy substance similar to fat that the human body needs to help build cells. The liver makes all the cholesterol that a person's body needs. Having high cholesterol (hypercholesterolemia) increases a person's risk for heart disease and stroke. Extra (excess) cholesterol comes from the food the person eats. High cholesterol can often be prevented with diet and lifestyle changes. If you already have high cholesterol, you can control it with diet and lifestyle changes and with medicine. How can high cholesterol affect me? If you have high cholesterol, deposits (plaques) may build up on the walls of your arteries. The arteries are the blood vessels that carry blood away from your heart. Plaques make the arteries narrower and stiffer. This can limit or block blood flow and cause blood clots to form. Blood clots:  Are tiny balls of cells that form in your blood.  Can move to the heart or brain, causing a heart attack or stroke. Plaques in arteries greatly increase your risk for heart attack and stroke.Making diet and lifestyle changes can reduce your risk for these conditions that may threaten your life. What can increase my risk? This condition is more likely to develop in people who:  Eat foods that are high in saturated fat or cholesterol. Saturated fat is mostly found in: ? Foods that contain animal fat, such as red meat and some dairy products. ? Certain fatty foods made from plants, such as tropical oils.  Are overweight.  Are not getting enough exercise.  Have a family history of high cholesterol. What actions can I take to prevent this? Nutrition   Eat less saturated fat.  Avoid trans fats (partially hydrogenated oils). These are often found in margarine and in some baked goods, fried foods, and snacks bought in packages.  Avoid precooked or cured meat, such as sausages or meat loaves.  Avoid foods and drinks that have added  sugars.  Eat more fruits, vegetables, and whole grains.  Choose healthy sources of protein, such as fish, poultry, lean cuts of red meat, beans, peas, lentils, and nuts.  Choose healthy sources of fat, such as: ? Nuts. ? Vegetable oils, especially olive oil. ? Fish that have healthy fats (omega-3 fatty acids), such as mackerel or salmon. The items listed above may not be a complete list of recommended foods and beverages. Contact a dietitian for more information. Lifestyle  Lose weight if you are overweight. Losing 5-10 lb (2.3-4.5 kg) can help prevent or control high cholesterol. It can also lower your risk for diabetes and high blood pressure. Ask your health care provider to help you with a diet and exercise plan to lose weight safely.  Do not use any products that contain nicotine or tobacco, such as cigarettes, e-cigarettes, and chewing tobacco. If you need help quitting, ask your health care provider.  Limit your alcohol intake. ? Do not drink alcohol if:  Your health care provider tells you not to drink.  You are pregnant, may be pregnant, or are planning to become pregnant. ? If you drink alcohol:  Limit how much you use to:  0-1 drink a day for women.  0-2 drinks a day for men.  Be aware of how much alcohol is in your drink. In the [place] one drink equals one 12 oz bottle of beer (355 mL), one 5 oz glass of wine (148 mL), or one 1 oz glass of hard liquor (44 mL). Activity   Get enough exercise. Each week, do at   least 150 minutes of exercise that takes a medium level of effort (moderate-intensity exercise). ? This is exercise that:  Makes your heart beat faster and makes you breathe harder than usual.  Allows you to still be able to talk. ? You could exercise in short sessions several times a day or longer sessions a few times a week. For example, on 5 days each week, you could walk fast or ride your bike 3 times a day for 10 minutes each time.  Do exercises as told  by your health care provider. Medicines  In addition to diet and lifestyle changes, your health care provider may recommend medicines to help lower cholesterol. This may be a medicine to lower the amount of cholesterol your liver makes. You may need medicine if: ? Diet and lifestyle changes do not lower your cholesterol enough. ? You have high cholesterol and other risk factors for heart disease or stroke.  Take over-the-counter and prescription medicines only as told by your health care provider. General information  Manage your risk factors for high cholesterol. Talk with your health care provider about all your risk factors and how to lower your risk.  Manage other conditions that you have, such as diabetes or high blood pressure (hypertension).  Have blood tests to check your cholesterol levels at regular points in time as told by your health care provider.  Keep all follow-up visits as told by your health care provider. This is important. Where to find more information  American Heart Association: www.heart.org  National Heart, Lung, and Blood Institute: www.nhlbi.nih.gov Summary  High cholesterol increases your risk for heart disease and stroke. By keeping your cholesterol level low, you can reduce your risk for these conditions.  High cholesterol can often be prevented with diet and lifestyle changes.  Work with your health care provider to manage your risk factors, and have your blood tested regularly. This information is not intended to replace advice given to you by your health care provider. Make sure you discuss any questions you have with your health care provider. Document Revised: 08/29/2018 Document Reviewed: 01/14/2016 Elsevier Patient Education  2020 Elsevier Inc.  

## 2019-09-29 LAB — LIPID PANEL W/O CHOL/HDL RATIO
Cholesterol, Total: 158 mg/dL (ref 100–199)
HDL: 46 mg/dL (ref >39)
LDL Chol Calc (NIH): 66 mg/dL (ref 0–99)
Triglycerides: 293 mg/dL — ABNORMAL HIGH (ref 0–149)
VLDL Cholesterol Cal: 46 mg/dL — ABNORMAL HIGH (ref 5–40)

## 2019-09-29 LAB — BASIC METABOLIC PANEL
BUN/Creatinine Ratio: 17 (ref 12–28)
BUN: 13 mg/dL (ref 8–27)
CO2: 22 mmol/L (ref 20–29)
Calcium: 9.6 mg/dL (ref 8.7–10.3)
Chloride: 105 mmol/L (ref 96–106)
Creatinine, Ser: 0.76 mg/dL (ref 0.57–1.00)
GFR calc Af Amer: 88 mL/min/{1.73_m2} (ref >59)
GFR calc non Af Amer: 76 mL/min/{1.73_m2} (ref >59)
Glucose: 97 mg/dL (ref 65–99)
Potassium: 4.3 mmol/L (ref 3.5–5.2)
Sodium: 143 mmol/L (ref 134–144)

## 2019-09-29 LAB — HEMOGLOBIN A1C
Est. average glucose Bld gHb Est-mCnc: 123 mg/dL
Hgb A1c MFr Bld: 5.9 % — ABNORMAL HIGH (ref 4.8–5.6)

## 2019-09-29 NOTE — Progress Notes (Signed)
Please let Tina Hardy know her labs have returned and continue to show some prediabetes with A1C 5.9%, continue to focus on lowering sugar and carbohydrate intake at home to avoid this going into diabetes range of 6.5% or greater.  Kidney function and electrolytes remain stable.  Cholesterol levels show goal range LDL, continue your daily cholesterol medication for stroke prevention.  Have a great day!!

## 2019-10-02 ENCOUNTER — Ambulatory Visit: Payer: Self-pay

## 2019-10-02 NOTE — Chronic Care Management (AMB) (Signed)
  Care Management   Follow Up Note   10/02/2019 Name: Tina Hardy MRN: [id number] DOB: [birth year]  Referred by: Venita Lick, NP Reason for referral : [place] is a 79 y.o. year old female who is a primary care patient of Cannady, Barbaraann Faster, NP. The care management team was consulted for assistance with care management and care coordination needs.    Review of patient status, including review of consultants reports, relevant laboratory and other test results, and collaboration with appropriate care team members and the patient's provider was performed as part of comprehensive patient evaluation and provision of chronic care management services.    LCSW completed CCM outreach attempt today but was unable to reach patient successfully. A HIPPA compliant voice message was left encouraging patient to return call once available. LCSW rescheduled CCM SW appointment as well.  A HIPPA compliant phone message was left for the patient providing contact information and requesting a return call.   Eula Fried, [name], MSW, [facility] Care Management [email or web address] Phone: [phone]

## 2019-10-16 ENCOUNTER — Other Ambulatory Visit: Payer: Self-pay | Admitting: Nurse Practitioner

## 2019-10-16 MED ORDER — AMLODIPINE BESYLATE 5 MG PO TABS: 5.0000 mg | ORAL_TABLET | Freq: Every day | ORAL | 0 refills | Status: DC

## 2019-10-16 MED ORDER — CLOPIDOGREL BISULFATE 75 MG PO TABS: 75.0000 mg | ORAL_TABLET | Freq: Every day | ORAL | 4 refills | Status: DC

## 2019-10-16 NOTE — Telephone Encounter (Signed)
Requested Prescriptions  Pending Prescriptions Disp Refills  . amLODipine (NORVASC) 5 MG tablet 90 tablet 0    Sig: Take 1 tablet (5 mg total) by mouth daily.     Cardiovascular:  Calcium Channel Blockers Passed - 10/16/2019 10:12 AM      Passed - Last BP in normal range    BP Readings from Last 1 Encounters:  09/28/19 124/76         Passed - Valid encounter within last 6 months    Recent Outpatient Visits          2 weeks ago Essential hypertension   [place], [place] T, NP   2 months ago Situational anxiety   [place], [place] T, NP   2 months ago Situational anxiety   [place], [place] T, NP   4 months ago Essential hypertension   [place], Henrine Screws T, NP   5 months ago [place], Lilia Argue, PA-C      Future Appointments            In 1 month Cannady, Barbaraann Faster, NP [facility], [place]   In 5 months [place], Barbaraann Faster, NP [facility]           . clopidogrel (PLAVIX) 75 MG tablet 90 tablet 3    Sig: Take 1 tablet (75 mg total) by mouth daily at 6 (six) AM.     Hematology: Antiplatelets - clopidogrel Failed - 10/16/2019 10:12 AM      Failed - Evaluate AST, ALT within 2 months of therapy initiation.      Failed - HCT in normal range and within 180 days    HCT  Date Value Ref Range Status  04/23/2019 34.8 (L) 36.0 - 46.0 % Final         Failed - HGB in normal range and within 180 days    Hemoglobin  Date Value Ref Range Status  04/23/2019 11.6 (L) 12.0 - 15.0 g/dL Final         Passed - ALT in normal range and within 360 days    ALT  Date Value Ref Range Status  05/26/2019 24 0 - 32 IU/L Final         Passed - AST in normal range and within 360 days    AST  Date Value Ref Range Status  05/26/2019 21 0 - 40 IU/L Final         Passed - PLT in normal range and within 180 days    Platelets  Date Value Ref  Range Status  04/23/2019 333 150 - 400 K/uL Final         Passed - Valid encounter within last 6 months    Recent Outpatient Visits          2 weeks ago Essential hypertension   [place], [place] T, NP   2 months ago Situational anxiety   [place], [place] T, NP   2 months ago Situational anxiety   [place], [place] T, NP   4 months ago Essential hypertension   [place], [place] T, NP   5 months ago [place], Lilia Argue, [place]      Future Appointments            In 1 month Cannady, Barbaraann Faster, NP [facility]  In 5 months Cannady, Barbaraann Faster, NP [facility], [facility]

## 2019-10-16 NOTE — Telephone Encounter (Signed)
Requested medication (s) are due for refill today: yes  Requested medication (s) are on the active medication list: yes  Last refill:  05/20/19  [facility] physician  Future visit scheduled: yes  Notes to clinic:  H/H low historic provider.  Patient is requesting fluid pill per note with her request.  She refused OV. FYI    Requested Prescriptions  Pending Prescriptions Disp Refills   clopidogrel (PLAVIX) 75 MG tablet 90 tablet 3    Sig: Take 1 tablet (75 mg total) by mouth daily at 6 (six) AM.      Hematology: Antiplatelets - clopidogrel Failed - 10/16/2019 10:12 AM      Failed - Evaluate AST, ALT within 2 months of therapy initiation.      Failed - HCT in normal range and within 180 days    HCT  Date Value Ref Range Status  04/23/2019 34.8 (L) 36.0 - 46.0 % Final          Failed - HGB in normal range and within 180 days    Hemoglobin  Date Value Ref Range Status  04/23/2019 11.6 (L) 12.0 - 15.0 g/dL Final          Passed - ALT in normal range and within 360 days    ALT  Date Value Ref Range Status  05/26/2019 24 0 - 32 IU/L Final          Passed - AST in normal range and within 360 days    AST  Date Value Ref Range Status  05/26/2019 21 0 - 40 IU/L Final          Passed - PLT in normal range and within 180 days    Platelets  Date Value Ref Range Status  04/23/2019 333 150 - 400 K/uL Final          Passed - Valid encounter within last 6 months    Recent Outpatient Visits           2 weeks ago Essential hypertension   [place], [place] T, NP   2 months ago Situational anxiety   [place], [place] T, NP   2 months ago Situational anxiety   [place], [place] T, NP   4 months ago Essential hypertension   [place], [place] T, NP   5 months ago [place], [place], PA-C       Future Appointments             In 1 month Cannady,  [place] T, NP [facility]   In 5 months [place], [place] T, NP [facility]             Signed Prescriptions Disp Refills   amLODipine (NORVASC) 5 MG tablet 90 tablet 0    Sig: Take 1 tablet (5 mg total) by mouth daily.      Cardiovascular:  Calcium Channel Blockers Passed - 10/16/2019 10:12 AM      Passed - Last BP in normal range    BP Readings from Last 1 Encounters:  09/28/19 124/76          Passed - Valid encounter within last 6 months    Recent Outpatient Visits           2 weeks ago Essential hypertension   [place], Henrine Screws T, NP   2 months ago Situational anxiety   Financial risk analyst  Venita Lick, NP   2 months ago Situational anxiety   [place], [place] T, NP   4 months ago Essential hypertension   [place], [place] T, NP   5 months ago [place], Lilia Argue, [place]       Future Appointments             In 1 month Cannady, Barbaraann Faster, NP [facility], [place]   In 5 months [place], Barbaraann Faster, NP [facility]

## 2019-10-16 NOTE — Telephone Encounter (Signed)
Pt called back and would like to be prescribed a fluid pill in addition to her requested refills..  Advised pt she needs an appt to get a Rx not prescribed in a long time.  Pt declined an appt, stating you know as women we need a fluid pill every now and again.  Pt states her ankles swell once in a while. (Not right now) Pt advised call her if any issues.

## 2019-10-16 NOTE — Telephone Encounter (Signed)
Routing to provider  

## 2019-10-16 NOTE — Telephone Encounter (Signed)
Copied from [place] [id number]. Topic: Quick Communication - Rx Refill/Question >> Oct 16, 2019  9:59 AM Yvette Rack wrote: Medication: amLODipine (NORVASC) 5 MG tablet and clopidogrel (PLAVIX) 75 MG tablet  Has the patient contacted their pharmacy? no  Preferred Pharmacy (with phone number or street name): [facility], [place].  Phone: [phone]  Fax: [phone]  Agent: Please be advised that RX refills may take up to 3 business days. We ask that you follow-up with your pharmacy.

## 2019-10-28 ENCOUNTER — Ambulatory Visit (INDEPENDENT_AMBULATORY_CARE_PROVIDER_SITE_OTHER): Payer: Medicare Other | Admitting: Pharmacist

## 2019-10-28 DIAGNOSIS — I10 Essential (primary) hypertension: Secondary | ICD-10-CM

## 2019-10-28 DIAGNOSIS — I6522 Occlusion and stenosis of left carotid artery: Secondary | ICD-10-CM

## 2019-10-28 DIAGNOSIS — F418 Other specified anxiety disorders: Secondary | ICD-10-CM

## 2019-10-28 DIAGNOSIS — E782 Mixed hyperlipidemia: Secondary | ICD-10-CM | POA: Diagnosis not present

## 2019-10-28 NOTE — Chronic Care Management (AMB) (Signed)
Chronic Care Management   Follow Up Note   10/28/2019 Name: Tina Hardy MRN: [id number] DOB: 10-04-1940  Referred by: Venita Lick, NP Reason for referral : Chronic Care Management (Medication Management)   Tina Hardy is a 79 y.o. year old female who is a primary care patient of Cannady, Barbaraann Faster, NP. The CCM team was consulted for assistance with chronic disease management and care coordination needs.    Contacted patient for medication management review.   Review of patient status, including review of consultants reports, relevant laboratory and other test results, and collaboration with appropriate care team members and the patient's provider was performed as part of comprehensive patient evaluation and provision of chronic care management services.    SDOH (Social Determinants of Health) assessments performed: Yes See Care Plan activities for detailed interventions related to [facility])     Outpatient Encounter Medications as of 10/28/2019  Medication Sig Note  . amLODipine (NORVASC) 5 MG tablet Take 1 tablet (5 mg total) by mouth daily.   Marland Kitchen aspirin EC 81 MG EC tablet Take 1 tablet (81 mg total) by mouth daily.   Marland Kitchen atorvastatin (LIPITOR) 10 MG tablet Take 1 tablet (10 mg total) by mouth daily.   . busPIRone (BUSPAR) 5 MG tablet Take 1 tablet (5 mg total) by mouth 2 (two) times daily.   . clopidogrel (PLAVIX) 75 MG tablet Take 1 tablet (75 mg total) by mouth daily at 6 (six) AM.   . Cyanocobalamin (VITAMIN B-12 PO) Take 1 drop by mouth daily as needed (ENERGY SUPPORT).   . Multiple Vitamins-Minerals (ADULT GUMMY PO) Take 2 tablets by mouth daily. 07/29/2019: As needed    No facility-administered encounter medications on file as of 10/28/2019.     Objective:   Goals Addressed            This Visit's Progress     Patient Stated   . PharmD "I don't have time for another stroke" (pt-stated)       CARE PLAN ENTRY (see longtitudinal plan of care for additional care  plan information)  Current Barriers:  . Polypharmacy; complex patient with multiple comorbidities including HTN, hx TIA, stenosis of L carotid artery . Reports that her niece, Tina Hardy, manages her finances and healthcare. Her son, Tina Hardy, lives with her. . Discussed today that she does not have a [employer]. . Most recent eGFR: 86 mL/min o HTN: amlodipine 5 mg daily, last BP at office at goal <130/80; patient reports that she used to have a BP machine, but that it is packed in storage "somewhere"  o HLD: atorvastatin 10 mg daily, last LDL at goal <70 o Aortic valve sclerosis s/p stenting 04/21/20; connected w/ vascular; aspirin 81 mg daily, clopidogrel 75 mg daily; o Anxiety: buspirone 5 mg BID; reported to PCP that she was not using regularly (self tx w/ MJ instead) but reported to me today that she takes BID. Declined referral to psychiatry at last PCP appt  Pharmacist Clinical Goal(s):  Marland Kitchen Over the next 90 days, patient will work with PharmD and provider towards optimized medication management  Interventions: . Comprehensive medication review performed, medication list updated in electronic medical record . Inter-disciplinary care team collaboration (see longitudinal plan of care) . Reviewed indication of each medication and importance in ASCVD risk reduction and reducing risk of progression of carotid stenosis. Patient confirms understanding . Reviewed importance of BP monitoring at home. Will mail patient information about how to request new [facility] card and call  about OTC benefits; will provide guidance on purchasing BP cuff if she does not have OTC benefits.   Patient Self Care Activities:  . Patient will take medications as prescribed  Please see past updates related to this goal by clicking on the "Past Updates" button in the selected goal          Plan:  - Scheduled f/u call in ~ 12 weeks  Catie Darnelle Maffucci, PharmD, [place] [phone]

## 2019-10-28 NOTE — Patient Instructions (Addendum)
Tina Hardy (and Tina Hardy),   Call [phone] to request a new [employer] card. Also call this number to ask if your plan has "Over the Counter Benefits". With these benefits, there are certain Over the Counter items you can request, including a blood pressure machine.   If you do not have this benefit, I do recommend you purchase a blood pressure machine for your use at home. I recommend ones with arm cuffs, NOT those with wrist cuffs, as wrist cuffs are less accurate.   A good brand is [employer]. They have standard, accurate cuffs for ~$30 on [place], or at [employer].   Please call us with any questions!   Catie Darnelle Maffucci, PharmD [phone]  Visit Information  Goals Addressed            This Visit's Progress     Patient Stated   . PharmD "I don't have time for another stroke" (pt-stated)       CARE PLAN ENTRY (see longtitudinal plan of care for additional care plan information)  Current Barriers:  . Polypharmacy; complex patient with multiple comorbidities including HTN, hx TIA, stenosis of L carotid artery . Reports that her niece, [name], manages her finances and healthcare. Her son, Tina Hardy, lives with her. . Discussed today that she does not have a [employer]. . Most recent eGFR: 86 mL/min o HTN: amlodipine 5 mg daily, last BP at office at goal <130/80; patient reports that she used to have a BP machine, but that it is packed in storage "somewhere"  o HLD: atorvastatin 10 mg daily, last LDL at goal <70 o Aortic valve sclerosis s/p stenting 04/21/20; connected w/ vascular; aspirin 81 mg daily, clopidogrel 75 mg daily; o Anxiety: buspirone 5 mg BID; reported to PCP that she was not using regularly (self tx w/ MJ instead) but reported to me today that she takes BID. Declined referral to psychiatry at last PCP appt  Pharmacist Clinical Goal(s):  Marland Kitchen Over the next 90 days, patient will work with PharmD and provider towards optimized medication  management  Interventions: . Comprehensive medication review performed, medication list updated in electronic medical record . Inter-disciplinary care team collaboration (see longitudinal plan of care) . Reviewed indication of each medication and importance in ASCVD risk reduction and reducing risk of progression of carotid stenosis. Patient confirms understanding . Reviewed importance of BP monitoring at home. Will mail patient information about how to request new [facility] card and call about OTC benefits; will provide guidance on purchasing BP cuff if she does not have OTC benefits.   Patient Self Care Activities:  . Patient will take medications as prescribed  Please see past updates related to this goal by clicking on the "Past Updates" button in the selected goal         The patient verbalized understanding of instructions provided today and agreed to receive a mailed copy of patient instruction and/or educational materials.  Plan:  - Scheduled f/u call in ~ 12 weeks  Catie Darnelle Maffucci, PharmD, [place]  [phone]

## 2019-11-04 ENCOUNTER — Ambulatory Visit: Payer: Self-pay | Admitting: General Practice

## 2019-11-04 ENCOUNTER — Telehealth: Payer: Self-pay | Admitting: General Practice

## 2019-11-04 DIAGNOSIS — I10 Essential (primary) hypertension: Secondary | ICD-10-CM

## 2019-11-04 DIAGNOSIS — E782 Mixed hyperlipidemia: Secondary | ICD-10-CM

## 2019-11-04 DIAGNOSIS — F418 Other specified anxiety disorders: Secondary | ICD-10-CM

## 2019-11-04 NOTE — Patient Instructions (Addendum)
Visit Information  Goals Addressed              This Visit's Progress   .  [name]: Pt: "I don't check my blood pressure, my cuff is in storage" (pt-stated)        CARE PLAN ENTRY (see longtitudinal plan of care for additional care plan information)  Current Barriers:  . Chronic Disease Management support, education, and care coordination needs related to HTN, HLD, and Anxiety  Clinical Goal(s) related to HTN, HLD, and Anxiety:  Over the next 120 days, patient will:  . Work with the care management team to address educational, disease management, and care coordination needs  . Begin or continue self health monitoring activities as directed today Measure and record blood pressure 2 times per week and adhere to a heart healthy diet . Call provider office for new or worsened signs and symptoms Blood pressure findings outside established parameters, Chest pain, and New or worsened symptom related to HLD, anxiety of other chronic conditions . Call care management team with questions or concerns . Verbalize basic understanding of patient centered plan of care established today  Interventions related to HTN, HLD, and Anxiety:  . Evaluation of current treatment plans and patient's adherence to plan as established by provider.  The patient states she is doing well. She endorses taking her medications as directed. . Assessed patient understanding of disease states.  The patient understands her chronic conditions. Discussed her hemoglobin A1C at 5.9 and being pre-diabetic. The patient verbalized she is not going to even have the diagnosis of DM attached to her name.  The patient verbalized she monitors what she eats.  . Assessed patient's education and care coordination needs.  The patient denies any issues with food or finances. Her niece takes care of her finances as she had another niece that took advantage of her. The patient states she had to down size considerably.  She went from a 4000 square  foot home to a very small home. Her niece takes care of her expenses for her and utilities.  . Provided disease specific education to patient.  Education and review of a heart healthy diet and use of DASH diet. The patient states she could do better with eating less salt. Will send written information on the DASH diet along with AVS to the patient via mail. The patient ask good questions. Has swelling in her feet and legs sometimes. Education on elevation of legs and feet when sitting. Education on how sodium follows water and that makes the heart work harder. The patient states that she appreciates all the information provided. . Assessed ability to check blood pressures. The patient states that her cuff is in storage and she has no way of taking it. The patient educated on call [employer] to see if she has the OTC benefit. Explained this to the patient and gave the patient the number to [facility] customer service. The patient also needs a new insurance card.  The patient will call and request a replacement card and a catalog if she has the OTC benefit. Explained the benefits of taking blood pressures regularly in monitoring for HTN.  The patient wants to do what she can to maintain her health and well being.  [name] with appropriate clinical care team members regarding patient needs  Patient Self Care Activities related to HTN, HLD, and Anxiety:  . Patient is unable to independently self-manage chronic health conditions  Initial goal documentation  The patient verbalized understanding of instructions provided today and agreed to receive a mailed copy of patient instruction and/or educational materials.  The care management team will reach out to the patient again over the next 30 to 60 days.   Tina Larsson RN, MSN, [facility] Mobile: [phone]   DASH Eating Plan DASH stands for "Dietary Approaches to Stop  Hypertension." The DASH eating plan is a healthy eating plan that has been shown to reduce high blood pressure (hypertension). It may also reduce your risk for type 2 diabetes, heart disease, and stroke. The DASH eating plan may also help with weight loss. What are tips for following this plan?  General guidelines Avoid eating more than 2,300 mg (milligrams) of salt (sodium) a day. If you have hypertension, you may need to reduce your sodium intake to 1,500 mg a day. Limit alcohol intake to no more than 1 drink a day for nonpregnant women and 2 drinks a day for men. One drink equals 12 oz of beer, 5 oz of wine, or 1 oz of hard liquor. Work with your health care provider to maintain a healthy body weight or to lose weight. Ask what an ideal weight is for you. Get at least 30 minutes of exercise that causes your heart to beat faster (aerobic exercise) most days of the week. Activities may include walking, swimming, or biking. Work with your health care provider or diet and nutrition specialist (dietitian) to adjust your eating plan to your individual calorie needs. Reading food labels  Check food labels for the amount of sodium per serving. Choose foods with less than 5 percent of the Daily Value of sodium. Generally, foods with less than 300 mg of sodium per serving fit into this eating plan. To find whole grains, look for the word "whole" as the first word in the ingredient list. Shopping Buy products labeled as "low-sodium" or "no salt added." Buy fresh foods. Avoid canned foods and premade or frozen meals. Cooking Avoid adding salt when cooking. Use salt-free seasonings or herbs instead of table salt or sea salt. Check with your health care provider or pharmacist before using salt substitutes. Do not fry foods. Cook foods using healthy methods such as baking, boiling, grilling, and broiling instead. Cook with heart-healthy oils, such as olive, canola, soybean, or sunflower oil. Meal  planning Eat a balanced diet that includes: 5 or more servings of fruits and vegetables each day. At each meal, try to fill half of your plate with fruits and vegetables. Up to 6-8 servings of whole grains each day. Less than 6 oz of lean meat, poultry, or fish each day. A 3-oz serving of meat is about the same size as a deck of cards. One egg equals 1 oz. 2 servings of low-fat dairy each day. A serving of nuts, seeds, or beans 5 times each week. Heart-healthy fats. Healthy fats called Omega-3 fatty acids are found in foods such as flaxseeds and coldwater fish, like sardines, salmon, and mackerel. Limit how much you eat of the following: Canned or prepackaged foods. Food that is high in trans fat, such as fried foods. Food that is high in saturated fat, such as fatty meat. Sweets, desserts, sugary drinks, and other foods with added sugar. Full-fat dairy products. Do not salt foods before eating. Try to eat at least 2 vegetarian meals each week. Eat more home-cooked food and less restaurant, buffet, and fast food. When eating  at a restaurant, ask that your food be prepared with less salt or no salt, if possible. What foods are recommended? The items listed may not be a complete list. Talk with your dietitian about what dietary choices are best for you. Grains Whole-grain or whole-wheat bread. Whole-grain or whole-wheat pasta. Brown rice. Modena Morrow. Bulgur. Whole-grain and low-sodium cereals. Pita bread. Low-fat, low-sodium crackers. Whole-wheat flour tortillas. Vegetables Fresh or frozen vegetables (raw, steamed, roasted, or grilled). Low-sodium or reduced-sodium tomato and vegetable juice. Low-sodium or reduced-sodium tomato sauce and tomato paste. Low-sodium or reduced-sodium canned vegetables. Fruits All fresh, dried, or frozen fruit. Canned fruit in natural juice (without added sugar). Meat and other protein foods Skinless chicken or [place]. Ground chicken or [place]. Pork with fat  trimmed off. Fish and seafood. Egg whites. Dried beans, peas, or lentils. Unsalted nuts, nut butters, and seeds. Unsalted canned beans. Lean cuts of beef with fat trimmed off. Low-sodium, lean deli meat. Dairy Low-fat (1%) or fat-free (skim) milk. Fat-free, low-fat, or reduced-fat cheeses. Nonfat, low-sodium ricotta or cottage cheese. Low-fat or nonfat yogurt. Low-fat, low-sodium cheese. Fats and oils Soft margarine without trans fats. Vegetable oil. Low-fat, reduced-fat, or light mayonnaise and salad dressings (reduced-sodium). Canola, safflower, olive, soybean, and sunflower oils. Avocado. Seasoning and other foods Herbs. Spices. Seasoning mixes without salt. Unsalted popcorn and pretzels. Fat-free sweets. What foods are not recommended? The items listed may not be a complete list. Talk with your dietitian about what dietary choices are best for you. Grains Baked goods made with fat, such as croissants, muffins, or some breads. Dry pasta or rice meal packs. Vegetables Creamed or fried vegetables. Vegetables in a cheese sauce. Regular canned vegetables (not low-sodium or reduced-sodium). Regular canned tomato sauce and paste (not low-sodium or reduced-sodium). Regular tomato and vegetable juice (not low-sodium or reduced-sodium). Angie Fava. Olives. Fruits Canned fruit in a light or heavy syrup. Fried fruit. Fruit in cream or butter sauce. Meat and other protein foods Fatty cuts of meat. Ribs. Fried meat. Berniece Salines. Sausage. Bologna and other processed lunch meats. Salami. Fatback. Hotdogs. Bratwurst. Salted nuts and seeds. Canned beans with added salt. Canned or smoked fish. Whole eggs or egg yolks. Chicken or [place] with skin. Dairy Whole or 2% milk, cream, and half-and-half. Whole or full-fat cream cheese. Whole-fat or sweetened yogurt. Full-fat cheese. Nondairy creamers. Whipped toppings. Processed cheese and cheese spreads. Fats and oils Butter. Stick margarine. Lard. Shortening. Ghee. Bacon fat.  Tropical oils, such as coconut, palm kernel, or palm oil. Seasoning and other foods Salted popcorn and pretzels. Onion salt, garlic salt, seasoned salt, table salt, and sea salt. Worcestershire sauce. Tartar sauce. Barbecue sauce. Teriyaki sauce. Soy sauce, including reduced-sodium. Steak sauce. Canned and packaged gravies. Fish sauce. Oyster sauce. Cocktail sauce. Horseradish that you find on the shelf. Ketchup. Mustard. Meat flavorings and tenderizers. Bouillon cubes. Hot sauce and Tabasco sauce. Premade or packaged marinades. Premade or packaged taco seasonings. Relishes. Regular salad dressings. Where to find more information: National Heart, Lung, and Canadian: https://wilson-eaton.com/ American Heart Association: www.heart.org Summary The DASH eating plan is a healthy eating plan that has been shown to reduce high blood pressure (hypertension). It may also reduce your risk for type 2 diabetes, heart disease, and stroke. With the DASH eating plan, you should limit salt (sodium) intake to 2,300 mg a day. If you have hypertension, you may need to reduce your sodium intake to 1,500 mg a day. When on the DASH eating plan, aim to eat more fresh fruits and  vegetables, whole grains, lean proteins, low-fat dairy, and heart-healthy fats. Work with your health care provider or diet and nutrition specialist (dietitian) to adjust your eating plan to your individual calorie needs. This information is not intended to replace advice given to you by your health care provider. Make sure you discuss any questions you have with your health care provider. Document Revised: 04/19/2017 Document Reviewed: 04/30/2016 Elsevier Patient Education  2020 [employer].

## 2019-11-04 NOTE — Chronic Care Management (AMB) (Signed)
Chronic Care Management   Follow Up Note   11/04/2019 Name: Tina Hardy MRN: [id number] DOB: [birth year]  Referred by: Venita Lick, NP Reason for referral : Chronic Care Management ([name]: Chronic Disease Management and Care Coordination Needs )   Tina Hardy is a 79 y.o. year old female who is a primary care patient of Cannady, Barbaraann Faster, NP. The CCM team was consulted for assistance with chronic disease management and care coordination needs.    Review of patient status, including review of consultants reports, relevant laboratory and other test results, and collaboration with appropriate care team members and the patient's provider was performed as part of comprehensive patient evaluation and provision of chronic care management services.    SDOH (Social Determinants of Health) assessments performed: Yes See Care Plan activities for detailed interventions related to SDOH)  SDOH Interventions     Most Recent Value  SDOH Interventions  Financial Strain Interventions Other (Comment)  [her niece manages her finances]  Physical Activity Interventions Other (Comments)  [goes to the gym when her son is there.  Does stretching at home]  Alcohol Brief Interventions/Follow-up AUDIT Score <7 follow-up not indicated       Outpatient Encounter Medications as of 11/04/2019  Medication Sig Note  . amLODipine (NORVASC) 5 MG tablet Take 1 tablet (5 mg total) by mouth daily.   Marland Kitchen aspirin EC 81 MG EC tablet Take 1 tablet (81 mg total) by mouth daily.   Marland Kitchen atorvastatin (LIPITOR) 10 MG tablet Take 1 tablet (10 mg total) by mouth daily.   . busPIRone (BUSPAR) 5 MG tablet Take 1 tablet (5 mg total) by mouth 2 (two) times daily.   . clopidogrel (PLAVIX) 75 MG tablet Take 1 tablet (75 mg total) by mouth daily at 6 (six) AM.   . Cyanocobalamin (VITAMIN B-12 PO) Take 1 drop by mouth daily as needed (ENERGY SUPPORT).   . Multiple Vitamins-Minerals (ADULT GUMMY PO) Take 2 tablets by mouth daily.  07/29/2019: As needed    No facility-administered encounter medications on file as of 11/04/2019.     Objective:  BP Readings from Last 3 Encounters:  09/28/19 124/76  08/31/19 (!) 165/81  05/26/19 132/68    Goals Addressed              This Visit's Progress   .  [name]: Pt: "I don't check my blood pressure, my cuff is in storage" (pt-stated)        CARE PLAN ENTRY (see longtitudinal plan of care for additional care plan information)  Current Barriers:  . Chronic Disease Management support, education, and care coordination needs related to HTN, HLD, and Anxiety  Clinical Goal(s) related to HTN, HLD, and Anxiety:  Over the next 120 days, patient will:  . Work with the care management team to address educational, disease management, and care coordination needs  . Begin or continue self health monitoring activities as directed today Measure and record blood pressure 2 times per week and adhere to a heart healthy diet . Call provider office for new or worsened signs and symptoms Blood pressure findings outside established parameters, Chest pain, and New or worsened symptom related to HLD, anxiety of other chronic conditions . Call care management team with questions or concerns . Verbalize basic understanding of patient centered plan of care established today  Interventions related to HTN, HLD, and Anxiety:  . Evaluation of current treatment plans and patient's adherence to plan as established by provider.  The patient states she is  doing well. She endorses taking her medications as directed. . Assessed patient understanding of disease states.  The patient understands her chronic conditions. Discussed her hemoglobin A1C at 5.9 and being pre-diabetic. The patient verbalized she is not going to even have the diagnosis of DM attached to her name.  The patient verbalized she monitors what she eats.  . Assessed patient's education and care coordination needs.  The patient denies any issues  with food or finances. Her niece takes care of her finances as she had another niece that took advantage of her. The patient states she had to down size considerably.  She went from a 4000 square foot home to a very small home. Her niece takes care of her expenses for her and utilities.  . Provided disease specific education to patient.  Education and review of a heart healthy diet and use of DASH diet. The patient states she could do better with eating less salt. Will send written information on the DASH diet along with AVS to the patient via mail. The patient ask good questions. Has swelling in her feet and legs sometimes. Education on elevation of legs and feet when sitting. Education on how sodium follows water and that makes the heart work harder. The patient states that she appreciates all the information provided. . Assessed ability to check blood pressures. The patient states that her cuff is in storage and she has no way of taking it. The patient educated on call [employer] to see if she has the OTC benefit. Explained this to the patient and gave the patient the number to [facility] customer service. The patient also needs a new insurance card.  The patient will call and request a replacement card and a catalog if she has the OTC benefit. Explained the benefits of taking blood pressures regularly in monitoring for HTN.  The patient wants to do what she can to maintain her health and well being.  [name] with appropriate clinical care team members regarding patient needs  Patient Self Care Activities related to HTN, HLD, and Anxiety:  . Patient is unable to independently self-manage chronic health conditions  Initial goal documentation         Plan:   The care management team will reach out to the patient again over the next 30 to 60 days.    Noreene Larsson RN, MSN, [facility] Mobile: [phone]

## 2019-11-18 ENCOUNTER — Telehealth: Payer: Self-pay

## 2019-11-18 NOTE — Telephone Encounter (Signed)
Called pt to follow up on foot swelling. Pt reports swelling has gone down and she does not need an appt at this time.

## 2019-11-18 NOTE — Telephone Encounter (Signed)
Noted, thank you

## 2019-11-24 ENCOUNTER — Ambulatory Visit: Payer: Medicare Other | Admitting: Nurse Practitioner

## 2019-12-23 ENCOUNTER — Telehealth: Payer: Self-pay | Admitting: *Deleted

## 2019-12-23 NOTE — Telephone Encounter (Signed)
Patient is calling to ask what is in the COVID vaccine- she is concerned about taking it and her right not to take it. Reviewed reasons to take the vaccine and that their will be a small number of patient who have a reaction. Patient believes God will protect her and she does not want to take the vaccine. 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 Advised her to take precautions- mask, wash hands, stay away from others- patient does not wear mask because it made her have a panic attack and she also vomited wearing it. Reminded patient due to her age- she is in high risk category(she laughed)- please take all the  precautions she can. She thanked the office for caring and kindness.

## 2019-12-23 NOTE — Telephone Encounter (Signed)
Noted, thank you.  She has stated these thoughts before, will further discuss with her at face to face visits.

## 2019-12-30 ENCOUNTER — Ambulatory Visit (INDEPENDENT_AMBULATORY_CARE_PROVIDER_SITE_OTHER): Payer: Medicare Other | Admitting: General Practice

## 2019-12-30 ENCOUNTER — Ambulatory Visit: Payer: Self-pay | Admitting: Nurse Practitioner

## 2019-12-30 ENCOUNTER — Telehealth: Payer: Self-pay | Admitting: General Practice

## 2019-12-30 DIAGNOSIS — E782 Mixed hyperlipidemia: Secondary | ICD-10-CM | POA: Diagnosis not present

## 2019-12-30 DIAGNOSIS — I10 Essential (primary) hypertension: Secondary | ICD-10-CM

## 2019-12-30 DIAGNOSIS — F418 Other specified anxiety disorders: Secondary | ICD-10-CM

## 2019-12-30 NOTE — Patient Instructions (Signed)
Visit Information  Goals Addressed              This Visit's Progress   .  [name]: Pt: "I don't check my blood pressure, my cuff is in storage" (pt-stated)        CARE PLAN ENTRY (see longtitudinal plan of care for additional care plan information)  Current Barriers:  . Chronic Disease Management support, education, and care coordination needs related to HTN, HLD, and Anxiety  Clinical Goal(s) related to HTN, HLD, and Anxiety:  Over the next 120 days, patient will:  . Work with the care management team to address educational, disease management, and care coordination needs  . Begin or continue self health monitoring activities as directed today Measure and record blood pressure 2 times per week and adhere to a heart healthy diet . Call provider office for new or worsened signs and symptoms Blood pressure findings outside established parameters, Chest pain, and New or worsened symptom related to HLD, anxiety of other chronic conditions . Call care management team with questions or concerns . Verbalize basic understanding of patient centered plan of care established today  Interventions related to HTN, HLD, and Anxiety:  . Evaluation of current treatment plans and patient's adherence to plan as established by provider.  The patient states she is doing well. She endorses taking her medications as directed. . Assessed patient understanding of disease states.  The patient understands her chronic conditions. Discussed her hemoglobin A1C at 5.9 and being pre-diabetic. The patient verbalized she is not going to even have the diagnosis of DM attached to her name.  The patient verbalized she monitors what she eats.  . Assessed patient's education and care coordination needs.  The patient denies any issues with food or finances. Her niece takes care of her finances as she had another niece that took advantage of her. The patient states she had to down size considerably.  She went from a 4000 square  foot home to a very small home. Her niece takes care of her expenses for her and utilities. 12-30-2019: The patient denies any needs at this time. She stays at home and is tired of being at home, would like to travel but does not have the finances to travel. She says she is doing well with her health and denies any new concerns. Evaluation of foot swelling and she says it is completely resolved and she has no new issues with swelling she can see her ankles.  . Provided disease specific education to patient.  Education and review of a heart healthy diet and use of DASH diet. The patient states she could do better with eating less salt. Will send written information on the DASH diet along with AVS to the patient via mail. The patient ask good questions. Has swelling in her feet and legs sometimes. Education on elevation of legs and feet when sitting. Education on how sodium follows water and that makes the heart work harder. The patient states that she appreciates all the information provided. 12-30-2019: Evaluation of heart healthy diet. The patient states that she is getting a lot of tomatoes from her garden and you just can't eat tomatoes without salt. Ask about the salt substitutes discussed on the last call and the patient says she needs to get some "salt lite".  Education on getting some Mrs. DASH. She states she forgot about Mrs. DASH and wrote a note to remember to get some.  Will continue to follow and educate accordingly.  Marland Kitchen  Assessed ability to check blood pressures. The patient states that her cuff is in storage and she has no way of taking it. The patient educated on call [employer] to see if she has the OTC benefit. Explained this to the patient and gave the patient the number to [facility] customer service. The patient also needs a new insurance card.  The patient will call and request a replacement card and a catalog if she has the OTC benefit. Explained the benefits of taking blood pressures regularly in monitoring  for HTN.  The patient wants to do what she can to maintain her health and well being. 12-30-2019: Evaluation of taking blood pressures and the patient says she does not know how to work the blood pressure machine but she knows her blood pressure is fine. Encouraged the patient to check blood pressures and record.  Reinforcement on the benefits of checking blood pressures needed for patient compliance.  [name] with appropriate clinical care team members regarding patient needs.  The patient is currently working with the LCSW and the pharmacist.   Patient Self Care Activities related to HTN, HLD, and Anxiety:  . Patient is unable to independently self-manage chronic health conditions  Please see past updates related to this goal by clicking on the "Past Updates" button in the selected goal         Patient verbalizes understanding of instructions provided today.   Face to Face appointment with care management team member scheduled for:  03-29-2020 at 8:30 when the patient has an appointment with the pcp at 08:40.   Noreene Larsson RN, MSN, [facility] Mobile: [phone]
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
## 2019-12-30 NOTE — Telephone Encounter (Signed)
Phone call to pt.  Reported onset of burning with urination since she got up this AM.  Denied fever/ chills, back pain, or blood in urine.  Stated she urinated about 5 times through the night, and has been voiding in small amts. today.  Appt. Sched. @ 8:00 AM, 8/12, at PCP office.  Pt. Stated she would like to have this appt. cleared with her son, as she relies on him for transportation.  Care advice given per protocol.  Verb. Understanding.  Advised pt. Will contact her son with appt. Time/ date.  Agreed with plan.      Reason for Disposition  Age > 50 years  Answer Assessment - Initial Assessment Questions 1. SEVERITY: "How bad is the pain?"  (e.g., Scale 1-10; mild, moderate, or severe)   - MILD (1-3): complains slightly about urination hurting   - MODERATE (4-7): interferes with normal activities     - SEVERE (8-10): excruciating, unwilling or unable to urinate because of the pain      8/10 2. FREQUENCY: "How many times have you had painful urination today?"      Not at this time 3. PATTERN: "Is pain present every time you urinate or just sometimes?"      Present with urination  4. ONSET: "When did the painful urination start?"      today 5. FEVER: "Do you have a fever?" If Yes, ask: "What is your temperature, how was it measured, and when did it start?"     denied 6. PAST UTI: "Have you had a urine infection before?" If Yes, ask: "When was the last time?" and "What happened that time?"      Unknown; does not think so 7. CAUSE: "What do you think is causing the painful urination?"  (e.g., UTI, scratch, Herpes sore)      8. OTHER SYMPTOMS: "Do you have any other symptoms?" (e.g., flank pain, vaginal discharge, genital sores, urgency, blood in urine)     Voiding in small amts., has burning with urination; denied back pain, or lower pelvic pain, denied blood in urine.   9. PREGNANCY: "Is there any chance you are pregnant?" "When was your last menstrual period?"     n/a  Protocols  used: URINATION PAIN [facility]  Message from Mellody Drown sent at 12/30/2019 2:09 PM EDT  Patient states it hurts when trying to use the bathroom, thinks it she may be experiencing kidney stones.

## 2019-12-30 NOTE — Telephone Encounter (Signed)
Call placed to pt's son, [name].  Left vm that an appt. Has been scheduled for pt. At 8:00 AM, 8/12.  Advised that the pt. Requested to check with son re: time of the appt.  Left vm for son to return call to office to confirm that he can bring pt. To office for that appt.  Notified pt. That nurse was not able to reach son at this time, and that a vm was left for him, re: the appt. Time/ date.  Verb. Understanding.

## 2019-12-31 ENCOUNTER — Other Ambulatory Visit: Payer: Self-pay

## 2019-12-31 ENCOUNTER — Ambulatory Visit (INDEPENDENT_AMBULATORY_CARE_PROVIDER_SITE_OTHER): Payer: Medicare Other | Admitting: Nurse Practitioner

## 2019-12-31 ENCOUNTER — Encounter: Payer: Self-pay | Admitting: Nurse Practitioner

## 2019-12-31 VITALS — BP 105/68 | HR 76 | Temp 97.8°F | Ht 65.0 in | Wt 156.2 lb

## 2019-12-31 DIAGNOSIS — R300 Dysuria: Secondary | ICD-10-CM | POA: Diagnosis not present

## 2019-12-31 MED ORDER — SULFAMETHOXAZOLE-TRIMETHOPRIM 800-160 MG PO TABS
1.0000 | ORAL_TABLET | Freq: Two times a day (BID) | ORAL | 0 refills | Status: AC
Start: 2019-12-31 — End: 2020-01-03

## 2019-12-31 NOTE — Telephone Encounter (Signed)
Pt called for UA results, CX pending.  Message/results not released to [place].  Please advise.

## 2019-12-31 NOTE — Assessment & Plan Note (Addendum)
Acute, ongoing.  UA positive for 3+ leukocytes, blood.  Microscopic showed >30 WBC, 3-10 RBC, and moderate amount of bacteria.  Will treat with Bactrim DS bid x 3 days.  Await culture results.  Patient to let us know early next week if symptoms do not improve.

## 2019-12-31 NOTE — Progress Notes (Signed)
BP 105/68 (BP Location: Left Arm, Patient Position: Sitting, Cuff Size: Normal)    Pulse 76    Temp 97.8 F (36.6 C) (Oral)    Ht 5\' 5"  (1.651 m)    Wt 156 lb 3.2 oz (70.9 kg)    SpO2 96%    BMI 25.99 kg/m    Subjective:    Patient ID: Tina Hardy, female    DOB: 05/18/1941, 79 y.o.   MRN: [id number]  HPI: Tina Hardy is a 79 y.o. female  Chief Complaint  Patient presents with   Dysuria   URINARY SYMPTOMS Onset: Tuesday Dysuria: yes Urinary frequency: yes - 5 times per night Urgency: yes Small volume voids: no Symptom severity: mild Urinary incontinence: no Foul odor: no Hematuria: no Abdominal pain: no Back pain: no Suprapubic pain/pressure: no Flank pain: no Fever:  no Nausea: no Vomiting: no Relief with cranberry juice: not tried Relief with pyridium: not tried Status: worse Previous urinary tract infection: no Recurrent urinary tract infection: no Sexual activity: Not currently  History of sexually transmitted disease: on Treatments attempted: drank cranberry juice and increased fluids   Allergies  Allergen Reactions   Codeine Other (See Comments)    Unknown reaction    Penicillins Other (See Comments)    Unknown reaction  Has patient had a PCN reaction causing immediate rash, facial/tongue/throat swelling, SOB or lightheadedness with hypotension: n/a Has patient had a PCN reaction causing severe rash involving mucus membranes or skin necrosis: n/a Has patient had a PCN reaction that required hospitalization: n/a Has patient had a PCN reaction occurring within the last 10 years: n/a If all of the above answers are "NO", then may proceed with Cephalosporin use.    Outpatient Encounter Medications as of 12/31/2019  Medication Sig Note   amLODipine (NORVASC) 5 MG tablet Take 1 tablet (5 mg total) by mouth daily.    aspirin EC 81 MG EC tablet Take 1 tablet (81 mg total) by mouth daily.    atorvastatin (LIPITOR) 10 MG tablet Take 1 tablet (10  mg total) by mouth daily.    busPIRone (BUSPAR) 5 MG tablet Take 1 tablet (5 mg total) by mouth 2 (two) times daily.    clopidogrel (PLAVIX) 75 MG tablet Take 1 tablet (75 mg total) by mouth daily at 6 (six) AM.    Cyanocobalamin (VITAMIN B-12 PO) Take 1 drop by mouth daily as needed (ENERGY SUPPORT). (Patient not taking: Reported on 12/31/2019)    Multiple Vitamins-Minerals (ADULT GUMMY PO) Take 2 tablets by mouth daily. (Patient not taking: Reported on 12/31/2019) 07/29/2019: As needed    sulfamethoxazole-trimethoprim (BACTRIM DS) 800-160 MG tablet Take 1 tablet by mouth 2 (two) times daily for 3 days.    No facility-administered encounter medications on file as of 12/31/2019.   Patient Active Problem List   Diagnosis Date Noted   Dysuria 12/31/2019   Elevated hemoglobin A1c 05/26/2019   Aortic valve sclerosis 04/24/2019   Situational anxiety 04/24/2019   Stenosis of left carotid artery 03/23/2019   Insomnia 03/23/2019   Marijuana use 03/23/2019   Hypertension 03/22/2019   Hyperlipidemia 03/22/2019   TIA (transient ischemic attack) 02/16/2019   Ludwig's angina 07/04/2017   Chronic abdominal pain with cramping/bloating 02/04/2013   Hepatic hemangioma 07/16/2012   Diverticulosis of colon 06/22/2008   COLONIC POLYPS, HYPERPLASTIC, HX OF 06/22/2008   Past Medical History:  Diagnosis Date   Carotid arterial disease ([place])    Colon polyp 07/18/2004   Hyperplastic   Diverticulosis  Hepatic hemangioma 07/16/2012   Hypertension    Liver hemangioma    Pernicious anemia    Rectal bleed    Relevant past medical, surgical, family and social history reviewed and updated as indicated. Interim medical history since our last visit reviewed.  Review of Systems  Constitutional: Negative.  Negative for activity change, appetite change, chills, fatigue and fever.  Gastrointestinal: Negative.  Negative for abdominal pain, nausea and vomiting.  Genitourinary: Positive  for dysuria. Negative for decreased urine volume, difficulty urinating, flank pain, frequency, hematuria and urgency.  Musculoskeletal: Negative.  Negative for back pain.  Skin: Negative.   Neurological: Negative.  Negative for dizziness, weakness, light-headedness and headaches.  Psychiatric/Behavioral: Negative.     Per HPI unless specifically indicated above     Objective:    BP 105/68 (BP Location: Left Arm, Patient Position: Sitting, Cuff Size: Normal)    Pulse 76    Temp 97.8 F (36.6 C) (Oral)    Ht 5\' 5"  (1.651 m)    Wt 156 lb 3.2 oz (70.9 kg)    SpO2 96%    BMI 25.99 kg/m   Wt Readings from Last 3 Encounters:  12/31/19 156 lb 3.2 oz (70.9 kg)  09/28/19 158 lb 12.8 oz (72 kg)  08/31/19 152 lb (68.9 kg)    Physical Exam Vitals and nursing note reviewed.  Constitutional:      General: She is not in acute distress.    Appearance: Normal appearance. She is not toxic-appearing.  Abdominal:     General: Abdomen is flat. Bowel sounds are normal. There is no distension.     Palpations: Abdomen is soft. There is no mass.     Tenderness: There is no abdominal tenderness. There is no right CVA tenderness, left CVA tenderness or guarding.  Genitourinary:    Comments: deferred Skin:    General: Skin is warm and dry.     Coloration: Skin is not jaundiced or pale.  Neurological:     General: No focal deficit present.     Mental Status: She is alert and oriented to person, place, and time.     Motor: No weakness.     Gait: Gait normal.  Psychiatric:        Mood and Affect: Mood normal.        Behavior: Behavior normal.        Thought Content: Thought content normal.        Judgment: Judgment normal.     Results for orders placed or performed in visit on 41/96/22  Basic metabolic panel  Result Value Ref Range   Glucose 97 65 - 99 mg/dL   BUN 13 8 - 27 mg/dL   Creatinine, Ser 0.76 0.57 - 1.00 mg/dL   GFR calc non Af Amer 76 >59 mL/min/1.73   GFR calc Af Amer 88 >59  mL/min/1.73   BUN/Creatinine Ratio 17 12 - 28   Sodium 143 134 - 144 mmol/L   Potassium 4.3 3.5 - 5.2 mmol/L   Chloride 105 96 - 106 mmol/L   CO2 22 20 - 29 mmol/L   Calcium 9.6 8.7 - 10.3 mg/dL  Lipid Panel w/o Chol/HDL Ratio  Result Value Ref Range   Cholesterol, Total 158 100 - 199 mg/dL   Triglycerides 293 (H) 0 - 149 mg/dL   HDL 46 >39 mg/dL   VLDL Cholesterol Cal 46 (H) 5 - 40 mg/dL   LDL Chol Calc (NIH) 66 0 - 99 mg/dL  HgB A1c  Result  Value Ref Range   Hgb A1c MFr Bld 5.9 (H) 4.8 - 5.6 %   Est. average glucose Bld gHb Est-mCnc 123 mg/dL      Assessment & Plan:   Problem List Items Addressed This Visit      Other   Dysuria - Primary    Acute, ongoing.  UA positive for 3+ leukocytes, blood.  Microscopic showed >30 WBC, 3-10 RBC, and moderate amount of bacteria.  Will treat with Bactrim DS bid x 3 days.  Await culture results.  Patient to let us know early next week if symptoms do not improve.      Relevant Orders   UA/M w/rflx Culture, Routine       Follow up plan: Return if symptoms worsen or fail to improve.

## 2019-12-31 NOTE — Telephone Encounter (Signed)
Tried calling first number, went straight to VM. Tried call the second number and LVM asking for patient to please return my call.

## 2019-12-31 NOTE — Patient Instructions (Signed)
Urinary Tract Infection, Adult A urinary tract infection (UTI) is an infection of any part of the urinary tract. The urinary tract includes:  The kidneys.  The ureters.  The bladder.  The urethra. These organs make, store, and get rid of pee (urine) in the body. What are the causes? This is caused by germs (bacteria) in your genital area. These germs grow and cause swelling (inflammation) of your urinary tract. What increases the risk? You are more likely to develop this condition if:  You have a small, thin tube (catheter) to drain pee.  You cannot control when you pee or poop (incontinence).  You are female, and: ? You use these methods to prevent pregnancy:  A medicine that kills sperm (spermicide).  A device that blocks sperm (diaphragm). ? You have low levels of a female hormone (estrogen). ? You are pregnant.  You have genes that add to your risk.  You are sexually active.  You take antibiotic medicines.  You have trouble peeing because of: ? A prostate that is bigger than normal, if you are female. ? A blockage in the part of your body that drains pee from the bladder (urethra). ? A kidney stone. ? A nerve condition that affects your bladder (neurogenic bladder). ? Not getting enough to drink. ? Not peeing often enough.  You have other conditions, such as: ? Diabetes. ? A weak disease-fighting system (immune system). ? Sickle cell disease. ? Gout. ? Injury of the spine. What are the signs or symptoms? Symptoms of this condition include:  Needing to pee right away (urgently).  Peeing often.  Peeing small amounts often.  Pain or burning when peeing.  Blood in the pee.  Pee that smells bad or not like normal.  Trouble peeing.  Pee that is cloudy.  Fluid coming from the vagina, if you are female.  Pain in the belly or lower back. Other symptoms include:  Throwing up (vomiting).  No urge to eat.  Feeling mixed up (confused).  Being tired  and grouchy (irritable).  A fever.  Watery poop (diarrhea). How is this treated? This condition may be treated with:  Antibiotic medicine.  Other medicines.  Drinking enough water. Follow these instructions at home:  Medicines  Take over-the-counter and prescription medicines only as told by your doctor.  If you were prescribed an antibiotic medicine, take it as told by your doctor. Do not stop taking it even if you start to feel better. General instructions  Make sure you: ? Pee until your bladder is empty. ? Do not hold pee for a long time. ? Empty your bladder after sex. ? Wipe from front to back after pooping if you are a female. Use each tissue one time when you wipe.  Drink enough fluid to keep your pee pale yellow.  Keep all follow-up visits as told by your doctor. This is important. Contact a doctor if:  You do not get better after 1-2 days.  Your symptoms go away and then come back. Get help right away if:  You have very bad back pain.  You have very bad pain in your lower belly.  You have a fever.  You are sick to your stomach (nauseous).  You are throwing up. Summary  A urinary tract infection (UTI) is an infection of any part of the urinary tract.  This condition is caused by germs in your genital area.  There are many risk factors for a UTI. These include having a small, thin   tube to drain pee and not being able to control when you pee or poop.  Treatment includes antibiotic medicines for germs.  Drink enough fluid to keep your pee pale yellow. This information is not intended to replace advice given to you by your health care provider. Make sure you discuss any questions you have with your health care provider. Document Revised: 04/24/2018 Document Reviewed: 11/14/2017 Elsevier Patient Education  2020 Elsevier Inc.  

## 2020-01-01 ENCOUNTER — Telehealth: Payer: Self-pay

## 2020-01-01 ENCOUNTER — Other Ambulatory Visit: Payer: Self-pay | Admitting: Nurse Practitioner

## 2020-01-01 NOTE — Telephone Encounter (Signed)
Patient notified of results and medication.

## 2020-01-01 NOTE — Telephone Encounter (Signed)
Patient notified of results, see other telephone encounter.

## 2020-01-01 NOTE — Telephone Encounter (Signed)
-----   Message from Helayne Seminole, NP sent at 12/31/2019  9:08 AM EDT ----- Tried to call regarding urine results - went straight to voicemail.  Please let patient know that her urine results are indicative of a urinary tract infection - we have sent her urine off for further testing to see what kind of antibiotics will get rid of the infection.  In the meantime, I sent an antibiotic to her pharmacy.  I would like for her to start taking that twice daily for the next three days to clear up the infection.    Tell her be sure to let us know if her symptoms are not improving.

## 2020-01-03 LAB — MICROSCOPIC EXAMINATION: WBC, UA: >30 /hpf — AB (ref 0–5)

## 2020-01-03 LAB — UA/M W/RFLX CULTURE, ROUTINE
Bilirubin, UA: NEGATIVE
Glucose, UA: NEGATIVE
Ketones, UA: NEGATIVE
Nitrite, UA: NEGATIVE
Protein,UA: NEGATIVE
Specific Gravity, UA: 1.015 (ref 1.005–1.030)
Urobilinogen, Ur: 0.2 mg/dL (ref 0.2–1.0)
pH, UA: 5.5 (ref 5.0–7.5)

## 2020-01-03 LAB — URINE CULTURE, REFLEX

## 2020-01-06 ENCOUNTER — Telehealth: Payer: Self-pay

## 2020-01-06 ENCOUNTER — Telehealth: Payer: Self-pay | Admitting: Licensed Clinical Social Worker

## 2020-01-06 NOTE — Telephone Encounter (Signed)
  Chronic Care Management    Clinical Social Work General Follow Up Note  01/06/2020 Name: Tina Hardy MRN: [id number] DOB: 08-19-1940  Tina Hardy is a 79 y.o. year old female who is a primary care patient of Cannady, Barbaraann Faster, NP. The CCM team was consulted for assistance with Transportation Needs .   Review of patient status, including review of consultants reports, relevant laboratory and other test results, and collaboration with appropriate care team members and the patient's provider was performed as part of comprehensive patient evaluation and provision of chronic care management services.    LCSW completed CCM outreach attempt today but was unable to reach patient successfully. A HIPPA compliant voice message was left encouraging patient to return call once available. LCSW rescheduled CCM SW appointment as well.  Outpatient Encounter Medications as of 01/06/2020  Medication Sig Note  . amLODipine (NORVASC) 5 MG tablet Take 1 tablet (5 mg total) by mouth daily.   Marland Kitchen aspirin EC 81 MG EC tablet Take 1 tablet (81 mg total) by mouth daily.   Marland Kitchen atorvastatin (LIPITOR) 10 MG tablet TAKE 1 TABLET BY MOUTH ONCE DAILY   . busPIRone (BUSPAR) 5 MG tablet TAKE 1 TABLET BY MOUTH TWICE DAILY   . clopidogrel (PLAVIX) 75 MG tablet Take 1 tablet (75 mg total) by mouth daily at 6 (six) AM.   . Cyanocobalamin (VITAMIN B-12 PO) Take 1 drop by mouth daily as needed (ENERGY SUPPORT). (Patient not taking: Reported on 12/31/2019)   . Multiple Vitamins-Minerals (ADULT GUMMY PO) Take 2 tablets by mouth daily. (Patient not taking: Reported on 12/31/2019) 07/29/2019: As needed    No facility-administered encounter medications on file as of 01/06/2020.    Follow Up Plan: A HIPPA compliant voice message was left encouraging patient to return call once available. LCSW rescheduled CCM SW appointment as well.  Eula Fried, [name], MSW, [facility] Care Management [email or web address] Phone: [phone]

## 2020-01-20 ENCOUNTER — Telehealth: Payer: Self-pay

## 2020-01-29 ENCOUNTER — Ambulatory Visit: Payer: Self-pay | Admitting: Pharmacist

## 2020-01-29 DIAGNOSIS — I10 Essential (primary) hypertension: Secondary | ICD-10-CM

## 2020-01-29 DIAGNOSIS — E782 Mixed hyperlipidemia: Secondary | ICD-10-CM

## 2020-02-02 ENCOUNTER — Telehealth: Payer: Self-pay | Admitting: Pharmacist

## 2020-02-02 NOTE — Progress Notes (Unsigned)
  Chronic Care Management   Note  02/02/2020 Name: Tina Hardy MRN: [id number] DOB: June 08, 1940    Received phone call from son, [name], questioning whether he can given his mother Respir-All supplement. He states she has had a mild dry cough, fatigue, and body aches for two days. Per his report she not experienced shortness of breath,  Fever, chills, headache, or loss of taste or smell. He reports one episode of vomiting after he gave her Thera-Flu.  He reports having a virus for 48 hours last week with his main symptoms as cough and "being out of it". He was not tested for COVID-19 and neither he nor [name] have received the vaccine.  I recommended against administering the Risper-All as it contains Licorice root and nettle extract which can cause sensitivities in some patients. I recommended he continue to monitor her for worsening symptoms including fever or shortness of breath and instructed him to call 911 or report to the closest emergency room should her symptoms become severe. He plans to get tested for COVID today.   Junita Push. Kenton Kingfisher PharmD, [facility] [phone]    SIGNATURE

## 2020-02-03 ENCOUNTER — Other Ambulatory Visit: Payer: Medicare Other

## 2020-02-03 ENCOUNTER — Telehealth: Payer: Self-pay | Admitting: Pharmacist

## 2020-02-03 NOTE — Patient Instructions (Addendum)
Visit Information  It was a pleasure speaking with you today. Thank you for letting me be part of your clinical team. Please call with any questions or concerns.   Goals Addressed              This Visit's Progress   .  PharmD "I don't have time for another stroke" (pt-stated)        CARE PLAN ENTRY (see longtitudinal plan of care for additional care plan information)  Current Barriers:  . Polypharmacy; complex patient with multiple comorbidities including HTN, hx TIA, stenosis of L carotid artery . Reports that her niece, [name], manages her finances and healthcare. Her son, [name], lives with her. Per her report he will return to [place] to his wife once he is sure she's taken care of. He is currently not feeling well and she thinks he may have COVID. Marland Kitchen She is unable to tell me if she received new UHC card or BP monitor. . Discussed today that patient would like to apply for Medicare extra help. A member of the pharmacy team will reach to her to initiate. . Seen in office 12/31/19 for dysuria/UTI.  Completed 3 days of Bactrim. Reports no further symptoms . Most recent eGFR: 40m/min o HTN: amlodipine 5 mg daily, last BP at office at goal <130/80; patient reports that she used to have a BP machine, but that it is packed in storage "somewhere". She states her BP "is always good". o HLD: atorvastatin 10 mg daily, last LDL at goal <70. Triglycerides elevated >250. o Aortic valve sclerosis s/p stenting 04/21/20; connected w/ vascular; aspirin 81 mg daily, clopidogrel 75 mg daily; o Anxiety: buspirone 5 mg BID; previously reported to PCP that she was not using regularly but reported to me today that she takes BID. Declined referral to psychiatry at last PCP appt. Reports smoking marijuana daily and has no interest in decreasing or quitting. Elevated A1c at 5.9 in May 2021. Patient is eating multiple Hershey kisses during our meeting and tells me " I am not going to get diabetes."  We  discussed decreasing carbohydrate/sugary food intake to help ensure she does not develop diabetes.  Pharmacist Clinical Goal(s):  .Marland KitchenOver the next 90 days, patient will work with PharmD and provider towards optimized medication management  Interventions: . Comprehensive medication review performed, medication list updated in electronic medical record . Inter-disciplinary care team collaboration (see longitudinal plan of care) . Reviewed indication of each medication and importance in ASCVD risk reduction and reducing risk of progression of carotid stenosis. Patient confirms understanding. . We discussed financial strain of medication/health care costs. Mrs. CMckeeverdoes not know if she has ever applied for Medicare extra help. A member of the pharmacy team will reach out to her and assist with the process. . Reviewed importance of BP monitoring at home. Will ask pharmacy team to discuss BP readings with her when they contact for LIS application. . Recommended patient avoid contact with son, disinfect shared surfaces and wash hands frequently while he is sick. Neither have been vaccinated against COVID 19.  Patient Self Care Activities:  . Patient will take medications as prescribed . Patient will provide required portion of LIS application . Patient will check BP at home 3-4 times weekly.  Please see past updates related to this goal by clicking on the "Past Updates" button in the selected goal         The patient verbalized understanding of instructions provided today and agreed to  receive a mailed copy of patient instruction and/or educational materials.  Telephone follow up appointment with pharmacy team member scheduled for:04/04/20 at 11:15  Junita Push. Jacki Couse PharmD, BCPS Clinical Pharmacist [phone]  DASH Eating Plan DASH stands for "Dietary Approaches to Stop Hypertension." The DASH eating plan is a healthy eating plan that has been shown to reduce high blood pressure  (hypertension). It may also reduce your risk for type 2 diabetes, heart disease, and stroke. The DASH eating plan may also help with weight loss. What are tips for following this plan?  General guidelines  Avoid eating more than 2,300 mg (milligrams) of salt (sodium) a day. If you have hypertension, you may need to reduce your sodium intake to 1,500 mg a day.  Limit alcohol intake to no more than 1 drink a day for nonpregnant women and 2 drinks a day for men. One drink equals 12 oz of beer, 5 oz of wine, or 1 oz of hard liquor.  Work with your health care provider to maintain a healthy body weight or to lose weight. Ask what an ideal weight is for you.  Get at least 30 minutes of exercise that causes your heart to beat faster (aerobic exercise) most days of the week. Activities may include walking, swimming, or biking.  Work with your health care provider or diet and nutrition specialist (dietitian) to adjust your eating plan to your individual calorie needs. Reading food labels   Check food labels for the amount of sodium per serving. Choose foods with less than 5 percent of the Daily Value of sodium. Generally, foods with less than 300 mg of sodium per serving fit into this eating plan.  To find whole grains, look for the word "whole" as the first word in the ingredient list. Shopping  Buy products labeled as "low-sodium" or "no salt added."  Buy fresh foods. Avoid canned foods and premade or frozen meals. Cooking  Avoid adding salt when cooking. Use salt-free seasonings or herbs instead of table salt or sea salt. Check with your health care provider or pharmacist before using salt substitutes.  Do not fry foods. Cook foods using healthy methods such as baking, boiling, grilling, and broiling instead.  Cook with heart-healthy oils, such as olive, canola, soybean, or sunflower oil. Meal planning  Eat a balanced diet that includes: ? 5 or more servings of fruits and vegetables  each day. At each meal, try to fill half of your plate with fruits and vegetables. ? Up to 6-8 servings of whole grains each day. ? Less than 6 oz of lean meat, poultry, or fish each day. A 3-oz serving of meat is about the same size as a deck of cards. One egg equals 1 oz. ? 2 servings of low-fat dairy each day. ? A serving of nuts, seeds, or beans 5 times each week. ? Heart-healthy fats. Healthy fats called Omega-3 fatty acids are found in foods such as flaxseeds and coldwater fish, like sardines, salmon, and mackerel.  Limit how much you eat of the following: ? Canned or prepackaged foods. ? Food that is high in trans fat, such as fried foods. ? Food that is high in saturated fat, such as fatty meat. ? Sweets, desserts, sugary drinks, and other foods with added sugar. ? Full-fat dairy products.  Do not salt foods before eating.  Try to eat at least 2 vegetarian meals each week.  Eat more home-cooked food and less restaurant, buffet, and fast food.  When eating at  a restaurant, ask that your food be prepared with less salt or no salt, if possible. What foods are recommended? The items listed may not be a complete list. Talk with your dietitian about what dietary choices are best for you. Grains Whole-grain or whole-wheat bread. Whole-grain or whole-wheat pasta. Brown rice. Modena Morrow. Bulgur. Whole-grain and low-sodium cereals. Pita bread. Low-fat, low-sodium crackers. Whole-wheat flour tortillas. Vegetables Fresh or frozen vegetables (raw, steamed, roasted, or grilled). Low-sodium or reduced-sodium tomato and vegetable juice. Low-sodium or reduced-sodium tomato sauce and tomato paste. Low-sodium or reduced-sodium canned vegetables. Fruits All fresh, dried, or frozen fruit. Canned fruit in natural juice (without added sugar). Meat and other protein foods Skinless chicken or [place]. Ground chicken or [place]. Pork with fat trimmed off. Fish and seafood. Egg whites. Dried beans,  peas, or lentils. Unsalted nuts, nut butters, and seeds. Unsalted canned beans. Lean cuts of beef with fat trimmed off. Low-sodium, lean deli meat. Dairy Low-fat (1%) or fat-free (skim) milk. Fat-free, low-fat, or reduced-fat cheeses. Nonfat, low-sodium ricotta or cottage cheese. Low-fat or nonfat yogurt. Low-fat, low-sodium cheese. Fats and oils Soft margarine without trans fats. Vegetable oil. Low-fat, reduced-fat, or light mayonnaise and salad dressings (reduced-sodium). Canola, safflower, olive, soybean, and sunflower oils. Avocado. Seasoning and other foods Herbs. Spices. Seasoning mixes without salt. Unsalted popcorn and pretzels. Fat-free sweets. What foods are not recommended? The items listed may not be a complete list. Talk with your dietitian about what dietary choices are best for you. Grains Baked goods made with fat, such as croissants, muffins, or some breads. Dry pasta or rice meal packs. Vegetables Creamed or fried vegetables. Vegetables in a cheese sauce. Regular canned vegetables (not low-sodium or reduced-sodium). Regular canned tomato sauce and paste (not low-sodium or reduced-sodium). Regular tomato and vegetable juice (not low-sodium or reduced-sodium). Angie Fava. Olives. Fruits Canned fruit in a light or heavy syrup. Fried fruit. Fruit in cream or butter sauce. Meat and other protein foods Fatty cuts of meat. Ribs. Fried meat. Berniece Salines. Sausage. Bologna and other processed lunch meats. Salami. Fatback. Hotdogs. Bratwurst. Salted nuts and seeds. Canned beans with added salt. Canned or smoked fish. Whole eggs or egg yolks. Chicken or [place] with skin. Dairy Whole or 2% milk, cream, and half-and-half. Whole or full-fat cream cheese. Whole-fat or sweetened yogurt. Full-fat cheese. Nondairy creamers. Whipped toppings. Processed cheese and cheese spreads. Fats and oils Butter. Stick margarine. Lard. Shortening. Ghee. Bacon fat. Tropical oils, such as coconut, palm kernel, or palm  oil. Seasoning and other foods Salted popcorn and pretzels. Onion salt, garlic salt, seasoned salt, table salt, and sea salt. Worcestershire sauce. Tartar sauce. Barbecue sauce. Teriyaki sauce. Soy sauce, including reduced-sodium. Steak sauce. Canned and packaged gravies. Fish sauce. Oyster sauce. Cocktail sauce. Horseradish that you find on the shelf. Ketchup. Mustard. Meat flavorings and tenderizers. Bouillon cubes. Hot sauce and Tabasco sauce. Premade or packaged marinades. Premade or packaged taco seasonings. Relishes. Regular salad dressings. Where to find more information:  National Heart, Lung, and [place]: https://wilson-eaton.com/  American Heart Association: www.heart.org Summary  The DASH eating plan is a healthy eating plan that has been shown to reduce high blood pressure (hypertension). It may also reduce your risk for type 2 diabetes, heart disease, and stroke.  With the DASH eating plan, you should limit salt (sodium) intake to 2,300 mg a day. If you have hypertension, you may need to reduce your sodium intake to 1,500 mg a day.  When on the DASH eating plan, aim to eat  more fresh fruits and vegetables, whole grains, lean proteins, low-fat dairy, and heart-healthy fats.  Work with your health care provider or diet and nutrition specialist (dietitian) to adjust your eating plan to your individual calorie needs. This information is not intended to replace advice given to you by your health care provider. Make sure you discuss any questions you have with your health care provider. Document Revised: 04/19/2017 Document Reviewed: 04/30/2016 Elsevier Patient Education  2020 [employer].  How to Take Your Blood Pressure You can take your blood pressure at home with a machine. You may need to check your blood pressure at home:  To check if you have high blood pressure (hypertension).  To check your blood pressure over time.  To make sure your blood pressure medicine is  working. Supplies needed: You will need a blood pressure machine, or monitor. You can buy one at a drugstore or online. When choosing one:  Choose one with an arm cuff.  Choose one that wraps around your upper arm. Only one finger should fit between your arm and the cuff.  Do not choose one that measures your blood pressure from your wrist or finger. Your doctor can suggest a monitor. How to prepare Avoid these things for 30 minutes before checking your blood pressure:  Drinking caffeine.  Drinking alcohol.  Eating.  Smoking.  Exercising. Five minutes before checking your blood pressure:  Pee.  Sit in a dining chair. Avoid sitting in a soft couch or armchair.  Be quiet. Do not talk. How to take your blood pressure Follow the instructions that came with your machine. If you have a digital blood pressure monitor, these may be the instructions: 1. Sit up straight. 2. Place your feet on the floor. Do not cross your ankles or legs. 3. Rest your left arm at the level of your heart. You may rest it on a table, desk, or chair. 4. Pull up your shirt sleeve. 5. Wrap the blood pressure cuff around the upper part of your left arm. The cuff should be 1 inch (2.5 cm) above your elbow. It is best to wrap the cuff around bare skin. 6. Fit the cuff snugly around your arm. You should be able to place only one finger between the cuff and your arm. 7. Put the cord inside the groove of your elbow. 8. Press the power button. 9. Sit quietly while the cuff fills with air and loses air. 10. Write down the numbers on the screen. 11. Wait 2-3 minutes and then repeat steps 1-10. What do the numbers mean? Two numbers make up your blood pressure. The first number is called systolic pressure. The second is called diastolic pressure. An example of a blood pressure reading is "120 over 80" (or 120/80). If you are an adult and do not have a medical condition, use this guide to find out if your blood  pressure is normal: Normal  First number: below 120.  Second number: below 80. Elevated  First number: 120-129.  Second number: below 80. Hypertension stage 1  First number: 130-139.  Second number: 80-89. Hypertension stage 2  First number: 140 or above.  Second number: 26 or above. Your blood pressure is above normal even if only the top or bottom number is above normal. Follow these instructions at home:  Check your blood pressure as often as your doctor tells you to.  Take your monitor to your next doctor's appointment. Your doctor will: ? Make sure you are using it  correctly. ? Make sure it is working right.  Make sure you understand what your blood pressure numbers should be.  Tell your doctor if your medicines are causing side effects. Contact a doctor if:  Your blood pressure keeps being high. Get help right away if:  Your first blood pressure number is higher than 180.  Your second blood pressure number is higher than 120. This information is not intended to replace advice given to you by your health care provider. Make sure you discuss any questions you have with your health care provider. Document Revised: 04/19/2017 Document Reviewed: 10/14/2015 Elsevier Patient Education  2020 [employer].

## 2020-02-03 NOTE — Chronic Care Management (AMB) (Signed)
Chronic Care Management   Follow Up Note   02/03/2020 Name: Tina Hardy MRN: [id number] DOB: Jul 14, 1940  Referred by: Venita Lick, NP Reason for referral : Chronic Care Management ((follow-up htn,hld))   Tina Hardy is a 79 y.o. year old female who is a primary care patient of Cannady, Barbaraann Faster, NP. The CCM team was consulted for assistance with chronic disease management and care coordination needs.    Review of patient status, including review of consultants reports, relevant laboratory and other test results, and collaboration with appropriate care team members and the patient's provider was performed as part of comprehensive patient evaluation and provision of chronic care management services.    SDOH (Social Determinants of Health) assessments performed: Yes See Care Plan activities for detailed interventions related to [facility])     Outpatient Encounter Medications as of 01/29/2020  Medication Sig Note  . amLODipine (NORVASC) 5 MG tablet Take 1 tablet (5 mg total) by mouth daily.   Marland Kitchen aspirin EC 81 MG EC tablet Take 1 tablet (81 mg total) by mouth daily.   Marland Kitchen atorvastatin (LIPITOR) 10 MG tablet TAKE 1 TABLET BY MOUTH ONCE DAILY   . busPIRone (BUSPAR) 5 MG tablet TAKE 1 TABLET BY MOUTH TWICE DAILY   . clopidogrel (PLAVIX) 75 MG tablet Take 1 tablet (75 mg total) by mouth daily at 6 (six) AM.   . Multiple Vitamins-Minerals (ADULT GUMMY PO) Take 2 tablets by mouth daily.  07/29/2019: As needed   . Cyanocobalamin (VITAMIN B-12 PO) Take 1 drop by mouth daily as needed (ENERGY SUPPORT). (Patient not taking: Reported on 12/31/2019)    No facility-administered encounter medications on file as of 01/29/2020.     Objective:   Goals Addressed              This Visit's Progress   .  PharmD "I don't have time for another stroke" (pt-stated)        CARE PLAN ENTRY (see longtitudinal plan of care for additional care plan information)  Current Barriers:  . Polypharmacy;  complex patient with multiple comorbidities including HTN, hx TIA, stenosis of L carotid artery . Reports that her niece, [name], manages her finances and healthcare. Her son, [name], lives with her. Per her report he will return to [place] to his wife once he is sure she's taken care of. He is currently not feeling well and she thinks he may have COVID. Marland Kitchen She is unable to tell me if she received new UHC card or BP monitor. . Discussed today that patient would like to apply for Medicare extra help. A member of the pharmacy team will reach to her to initiate. . Seen in office 12/31/19 for dysuria/UTI.  Completed 3 days of Bactrim. Reports no further symptoms . Most recent eGFR: 46m/min o HTN: amlodipine 5 mg daily, last BP at office at goal <130/80; patient reports that she used to have a BP machine, but that it is packed in storage "somewhere". She states her BP "is always good". o HLD: atorvastatin 10 mg daily, last LDL at goal <70. Triglycerides elevated >250. o Aortic valve sclerosis s/p stenting 04/21/20; connected w/ vascular; aspirin 81 mg daily, clopidogrel 75 mg daily; o Anxiety: buspirone 5 mg BID; previously reported to PCP that she was not using regularly but reported to me today that she takes BID. Declined referral to psychiatry at last PCP appt. Reports smoking marijuana daily and has no interest in decreasing or quitting. Elevated A1c at 5.9 in May  2021. Patient is eating multiple Hershey kisses during our meeting and tells me " I am not going to get diabetes."  We discussed decreasing carbohydrate/sugary food intake to help ensure she does not develop diabetes.  Pharmacist Clinical Goal(s):  Marland Kitchen Over the next 90 days, patient will work with PharmD and provider towards optimized medication management  Interventions: . Comprehensive medication review performed, medication list updated in electronic medical record . Inter-disciplinary care team collaboration (see longitudinal plan of  care) . Reviewed indication of each medication and importance in ASCVD risk reduction and reducing risk of progression of carotid stenosis. Patient confirms understanding. . We discussed financial strain of medication/health care costs. [name] does not know if she has ever applied for Medicare extra help. A member of the pharmacy team will reach out to her and assist with the process. . Reviewed importance of BP monitoring at home. Will ask pharmacy team to discuss BP readings with her when they contact for LIS application. . Recommended patient avoid contact with son, disinfect shared surfaces and wash hands frequently while he is sick. Neither have been vaccinated against COVID 19.  Patient Self Care Activities:  . Patient will take medications as prescribed . Patient will provide required portion of LIS application . Patient will check BP at home 3-4 times weekly.  Please see past updates related to this goal by clicking on the "Past Updates" button in the selected goal          Plan:   The pharmacy  team will reach out to the patient again over the next 30 days. Follow-up appointment with PharmD 04/04/20.  Junita Push. Kenton Kingfisher PharmD, [facility] [phone]

## 2020-02-03 NOTE — Progress Notes (Signed)
  Chronic Care Management   Note  02/03/2020 Name: Tina Hardy MRN: [id number] DOB: 1941/01/07     Received call from [name] today. She states she is feeling better. She did not sound short of breath and did not cough during our conversation. Her main today concern is "being forced to get that shot." She states her son is going to the [employer] to receive the COVID vaccine today and he wants her to get one as well. She reports hearing from a friend that people die three years after getting the vaccine. I reassured her that the COVID vaccine has not been available for three years so this could not be based on fact. We further discussed the benefits, effectiveness, safety and side effects for the COVID vaccines provided by the CDC.   Junita Push. Kenton Kingfisher PharmD, [facility] [phone]

## 2020-02-08 ENCOUNTER — Other Ambulatory Visit: Payer: Medicare Other

## 2020-02-10 ENCOUNTER — Telehealth: Payer: Self-pay | Admitting: Pharmacist

## 2020-02-10 NOTE — Progress Notes (Signed)
02/10/20-Spoke to [employer] and she has asked me to contact her son Dudley Major [place]) because he handles everything for her and could better assist me with the LIS assistance application so she may receive the extra help. I did speak to Mr. Siebrandt and he has asked me to email him all information needed in order to successfully complete LIS application on his end for patient. [name] verbalized understanding if he has any questions or concerns to reach me at my number provided. Almyra Free Harris,CPP notified.  Raynelle Highland, [place] Assistant [phone]

## 2020-02-16 ENCOUNTER — Telehealth: Payer: Self-pay | Admitting: Nurse Practitioner

## 2020-02-16 NOTE — Telephone Encounter (Signed)
Called pt to schedule virtual Thursday afternoon, she wanted me to call her son [name] to schedule it. I called him but no answer  Copied from [place] [phone]. Topic: General - Inquiry >> Feb 16, 2020 10:15 AM Greggory Keen D wrote: Reason for CRM: Pt called saying she has not been feeling good for several day.  She states she has been laying in the bed.  She does not have a fever, no headache, was very vague about her symptoms.  She would like a nurse to call her back.   CB#  [phone]

## 2020-02-17 ENCOUNTER — Telehealth: Payer: Self-pay | Admitting: Pharmacist

## 2020-02-17 NOTE — Progress Notes (Signed)
02/17/20- Spoke with patient's son Dudley Major) to follow up with patient LIS process. Mr. Dudley Major states he has not been able to work on her application because patient (Ms.Wrede) has been sick and in bed for about 5 days with a productive cough and fatigue. Per Mr.Jonthan; he has been taking care of patient while she is sick but he will eventually look into the email that was sent to start her LIS application. [name] son states Ms. Ortner has been taking pain reliever medication, an immunoberry supplement, throat coat tea, and drinking plenty of fluids to ease cold symptoms. [name] son states he feels his mom may have a virus and not Covid. [name] son states Ms.Diep has a follow up appointment with primary care provider on Thursday 02/18/20.    Raynelle Highland, [place] Assistant [phone]

## 2020-02-18 ENCOUNTER — Telehealth: Payer: Medicare Other | Admitting: Unknown Physician Specialty

## 2020-02-26 ENCOUNTER — Telehealth: Payer: Self-pay | Admitting: Pharmacist

## 2020-02-26 ENCOUNTER — Other Ambulatory Visit (INDEPENDENT_AMBULATORY_CARE_PROVIDER_SITE_OTHER): Payer: Self-pay | Admitting: Nurse Practitioner

## 2020-02-26 DIAGNOSIS — I6523 Occlusion and stenosis of bilateral carotid arteries: Secondary | ICD-10-CM

## 2020-02-26 NOTE — Progress Notes (Deleted)
02/26/2020- Called patient's son [name]) in regards of initiating LIS assistance; however I could not speak further with Mr. [name] due to him driving in the middle of conversation. Mr. [name] did voice mother was improving from feeling under the weather since we last spoke over the phone. Informed patient's son that was great news, Kindly asked for a call back at his earliest convenience to speak regarding mother.   Raynelle Highland, [place] Assistant [phone]

## 2020-02-26 NOTE — Chronic Care Management (AMB) (Signed)
    Chronic Care Management Pharmacy Assistant   Name: Tina Hardy  MRN: [id number] DOB: [birth year]  Reason for Encounter: Patient Assistance Coordination     PCP : Venita Lick, NP  Allergies:   Allergies  Allergen Reactions  . Codeine Other (See Comments)    Unknown reaction   . Penicillins Other (See Comments)    Unknown reaction  Has patient had a PCN reaction causing immediate rash, facial/tongue/throat swelling, SOB or lightheadedness with hypotension: n/a Has patient had a PCN reaction causing severe rash involving mucus membranes or skin necrosis: n/a Has patient had a PCN reaction that required hospitalization: n/a Has patient had a PCN reaction occurring within the last 10 years: n/a If all of the above answers are "NO", then may proceed with Cephalosporin use.     Medications: Outpatient Encounter Medications as of 02/26/2020  Medication Sig Note  . amLODipine (NORVASC) 5 MG tablet Take 1 tablet (5 mg total) by mouth daily.   Marland Kitchen aspirin EC 81 MG EC tablet Take 1 tablet (81 mg total) by mouth daily.   Marland Kitchen atorvastatin (LIPITOR) 10 MG tablet TAKE 1 TABLET BY MOUTH ONCE DAILY   . busPIRone (BUSPAR) 5 MG tablet TAKE 1 TABLET BY MOUTH TWICE DAILY   . clopidogrel (PLAVIX) 75 MG tablet Take 1 tablet (75 mg total) by mouth daily at 6 (six) AM.   . Cyanocobalamin (VITAMIN B-12 PO) Take 1 drop by mouth daily as needed (ENERGY SUPPORT). (Patient not taking: Reported on 12/31/2019)   . Multiple Vitamins-Minerals (ADULT GUMMY PO) Take 2 tablets by mouth daily.  07/29/2019: As needed    No facility-administered encounter medications on file as of 02/26/2020.    Current Diagnosis: Patient Active Problem List   Diagnosis Date Noted  . Dysuria 12/31/2019  . Elevated hemoglobin A1c 05/26/2019  . Aortic valve sclerosis 04/24/2019  . Situational anxiety 04/24/2019  . Stenosis of left carotid artery 03/23/2019  . Insomnia 03/23/2019  . Marijuana use 03/23/2019  .  Hypertension 03/22/2019  . Hyperlipidemia 03/22/2019  . TIA (transient ischemic attack) 02/16/2019  . 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 Chronic abdominal pain with cramping/bloating 02/04/2013  . Hepatic hemangioma 07/16/2012  . Diverticulosis of colon 06/22/2008  . COLONIC POLYPS, HYPERPLASTIC, HX OF 06/22/2008     02/26/2020- Called patient's son Tina Hardy) in regards of initiating LIS assistance; however I could not speak further with Mr. Tina Hardy due to him driving in the middle of conversation. Mr. Tina Hardy did voice mother was improving from feeling under the weather since we last spoke over the phone. Informed patient's son that was great news, Kindly asked for a call back at his earliest convenience to speak regarding mother.  03/02/2020: Attempted to follow up with son in regards to patients request of initiating for LIS assistance. Son explained this was not a good time to talk. He explained he would give me a call back the following week.   03/09/20: Third attempt to follow up with son in regards to patients requesting for initiating LIS assistance. Son explained he would call me back to discuss this situation at another time.     Follow-Up:  Patient Assistance Coordination/ CPA to follow up with son during the month of November.     Raynelle Highland, [place] Assistant [phone]

## 2020-02-29 ENCOUNTER — Encounter (INDEPENDENT_AMBULATORY_CARE_PROVIDER_SITE_OTHER): Payer: Medicare Other

## 2020-02-29 ENCOUNTER — Ambulatory Visit (INDEPENDENT_AMBULATORY_CARE_PROVIDER_SITE_OTHER): Payer: Medicare Other | Admitting: Vascular Surgery

## 2020-03-09 ENCOUNTER — Telehealth: Payer: Self-pay

## 2020-03-21 ENCOUNTER — Ambulatory Visit: Payer: Self-pay

## 2020-03-21 NOTE — Chronic Care Management (AMB) (Signed)
  Care Management   Follow Up Note   03/21/2020 Name: Tahliyah Anagnos MRN: [id number] DOB: [birth year]  Referred by: Venita Lick, NP Reason for referral : [place] is a 79 y.o. year old female who is a primary care patient of Cannady, Barbaraann Faster, NP. The care management team was consulted for assistance with care management and care coordination needs.    Review of patient status, including review of consultants reports, relevant laboratory and other test results, and collaboration with appropriate care team members and the patient's provider was performed as part of comprehensive patient evaluation and provision of chronic care management services.    LCSW completed two CCM outreach attempts today but was unable to reach patient successfully. A HIPPA compliant voice message was left encouraging patient to return call once available. LCSW will reschedule patient's CCM appointment if no return call is completed.   Eula Fried, [name], MSW, [facility] Care Management [email or web address] Phone: [phone]

## 2020-03-25 ENCOUNTER — Telehealth: Payer: Self-pay

## 2020-03-25 NOTE — Telephone Encounter (Signed)
Recommend she start taking a daily Claritin or Allegra.  Also recommend Refresh and Vision Allergy eye drops.

## 2020-03-25 NOTE — Telephone Encounter (Signed)
Called and LVM letting patient know [name]'s recommendation.

## 2020-03-25 NOTE — Telephone Encounter (Signed)
Routing to provider to advise.  

## 2020-03-25 NOTE — Telephone Encounter (Signed)
Copied from [place] [id number]. Topic: General - Other >> Mar 25, 2020  8:45 AM Keene Breath wrote: Reason for CRM: Patient called to inform the doctor that her allergies has her eyes very itchy and burning.  She already has an appt. For Tuesday but wanted to get advice as to what she could use until that appt.  Please call patient to discuss at [phone]

## 2020-03-29 ENCOUNTER — Ambulatory Visit: Payer: Self-pay | Admitting: General Practice

## 2020-03-29 ENCOUNTER — Other Ambulatory Visit: Payer: Self-pay

## 2020-03-29 ENCOUNTER — Ambulatory Visit (INDEPENDENT_AMBULATORY_CARE_PROVIDER_SITE_OTHER): Payer: Medicare Other | Admitting: Nurse Practitioner

## 2020-03-29 ENCOUNTER — Encounter: Payer: Self-pay | Admitting: Nurse Practitioner

## 2020-03-29 VITALS — BP 114/72 | HR 89 | Temp 98.0°F | Ht 63.54 in | Wt 147.4 lb

## 2020-03-29 DIAGNOSIS — E782 Mixed hyperlipidemia: Secondary | ICD-10-CM | POA: Diagnosis not present

## 2020-03-29 DIAGNOSIS — I10 Essential (primary) hypertension: Secondary | ICD-10-CM

## 2020-03-29 DIAGNOSIS — H1010 Acute atopic conjunctivitis, unspecified eye: Secondary | ICD-10-CM | POA: Insufficient documentation

## 2020-03-29 DIAGNOSIS — H1013 Acute atopic conjunctivitis, bilateral: Secondary | ICD-10-CM | POA: Diagnosis not present

## 2020-03-29 DIAGNOSIS — R7309 Other abnormal glucose: Secondary | ICD-10-CM | POA: Diagnosis not present

## 2020-03-29 DIAGNOSIS — F418 Other specified anxiety disorders: Secondary | ICD-10-CM

## 2020-03-29 MED ORDER — OLOPATADINE HCL 0.1 % OP SOLN: 1.0000 [drp] | Freq: Two times a day (BID) | OPHTHALMIC | 12 refills | Status: DC

## 2020-03-29 MED ORDER — LORATADINE 10 MG PO TABS: 10.0000 mg | ORAL_TABLET | Freq: Every day | ORAL | 4 refills | Status: DC

## 2020-03-29 MED ORDER — ATORVASTATIN CALCIUM 10 MG PO TABS
10.0000 mg | ORAL_TABLET | Freq: Every day | ORAL | 4 refills | Status: DC
Start: 2020-03-29 — End: 2020-07-05

## 2020-03-29 MED ORDER — AMLODIPINE BESYLATE 5 MG PO TABS
5.0000 mg | ORAL_TABLET | Freq: Every day | ORAL | 4 refills | Status: DC
Start: 2020-03-29 — End: 2021-05-17

## 2020-03-29 NOTE — Patient Instructions (Signed)
Allergic Conjunctivitis A clear membrane (conjunctiva) covers the white part of your eye and the inner surface of your eyelid. Allergic conjunctivitis happens when this membrane has inflammation. This is caused by allergies. Common causes of allergic reactions (allergens) include:  Outdoor allergens, such as: ? Pollen. ? Grass and weeds. ? Mold spores.  Indoor allergens, such as: ? Dust. ? Smoke. ? Mold. ? Pet dander. ? Animal hair. This condition can make your eye red or pink. It can also make your eye feel itchy. This condition cannot be spread from one person to another person (is not contagious). Follow these instructions at home:  Try not to be around things that you are allergic to.  Take or apply over-the-counter and prescription medicines only as told by your doctor. These include any eye drops.  Place a cool, clean washcloth on your eye for 10-20 minutes. Do this 3-4 times a day.  Do not touch or rub your eyes.  Do not wear contact lenses until the inflammation is gone. Wear glasses instead.  Do not wear eye makeup until the inflammation is gone.  Keep all follow-up visits as told by your doctor. This is important. Contact a doctor if:  Your symptoms get worse.  Your symptoms do not get better with treatment.  You have mild eye pain.  You are sensitive to light,  You have spots or blisters on your eyes.  You have pus coming from your eye.  You have a fever. Get help right away if:  You have redness, swelling, or other symptoms in only one eye.  Your vision is blurry.  You have vision changes.  You have very bad eye pain. Summary  Allergic conjunctivitis is caused by allergies. It can make your eye red or pink, and it can make your eye feel itchy.  This condition cannot be spread from one person to another person (is not contagious).  Try not to be around things that you are allergic to.  Take or apply over-the-counter and prescription medicines  only as told by your doctor. These include any eye drops.  Contact your doctor if your symptoms get worse or they do not get better with treatment. This information is not intended to replace advice given to you by your health care provider. Make sure you discuss any questions you have with your health care provider. Document Revised: 08/26/2018 Document Reviewed: 12/30/2015 Elsevier Patient Education  2020 Elsevier Inc.  

## 2020-03-29 NOTE — Progress Notes (Signed)
BP 114/72   Pulse 89   Temp 98 F (36.7 C) (Oral)   Ht 5' 3.54" (1.614 m)   Wt 147 lb 6.4 oz (66.9 kg)   SpO2 98%   BMI 25.67 kg/m    Subjective:    Patient ID: Tina Hardy, female    DOB: [birth year], 79 y.o.   MRN: [id number]  HPI: Tina Hardy is a 79 y.o. female  Chief Complaint  Patient presents with  . Hypertension  . Hyperlipidemia  . Eyelids red    have been red, itchy and and irritated for past 2 weeks, patient has been using benadryl and using Marijuana oil as well   HYPERLIPIDEMIA/HTN Continues on Atorvastatin 10 MG and Amlodipine 5 MG + ASA & Plavix. In September had CT of head which was unremarkable and MRI which showed chronic cerebellar infarct and chronic microvascular changes, with no acute stroke. Had stent placed left carotid in December 2020.Last saw vascular on 08/31/2019.  Recent May labs showed A1C -- 5.9%, CRT 0.76, and GFR 76, LDL 66. Hyperlipidemia status:good compliance Satisfied with current treatment?yes Side effects:no Medication compliance:good compliance Past cholesterol meds:Lipitor Supplements:none Aspirin:yes The 10-year ASCVD risk score Mikey Bussing DC Jr., et al., 2013) is: 20.5%   Values used to calculate the score:     Age: 62 years     Sex: Female     Is Non-Hispanic African American: No     Diabetic: No     Tobacco smoker: No     Systolic Blood Pressure: 983 mmHg     Is BP treated: Yes     HDL Cholesterol: 46 mg/dL     Total Cholesterol: 158 mg/dL  EYE REDNESS Present x 2 weeks with weather change and allergies.  Tried MJ oil, which has not been helping. Duration:  weeks Involved eye:  bilateral Foreign body sensation:no Visual impairment: no Eye redness: yes Discharge: no Crusting or matting of eyelids: no Swelling: no Photophobia: no Itching: yes Tearing: yes Headache: no Floaters: no URI symptoms: no Contact lens use: no Close contacts with similar problems: no Eye trauma: no Aggravating  factors: none Alleviating factors: Benadryl Status: stable Treatments attempted:  Relevant past medical, surgical, family and social history reviewed and updated as indicated. Interim medical history since our last visit reviewed. Allergies and medications reviewed and updated.  Review of Systems  Constitutional: Negative for activity change, appetite change, diaphoresis, fatigue and fever.  Eyes: Positive for redness and itching. Negative for photophobia, pain and visual disturbance.  Respiratory: Negative for cough, chest tightness and shortness of breath.   Cardiovascular: Negative for chest pain, palpitations and leg swelling.  Gastrointestinal: Negative.   Endocrine: Negative.   Neurological: Negative.   Psychiatric/Behavioral: Negative.     Per HPI unless specifically indicated above     Objective:    BP 114/72   Pulse 89   Temp 98 F (36.7 C) (Oral)   Ht 5' 3.54" (1.614 m)   Wt 147 lb 6.4 oz (66.9 kg)   SpO2 98%   BMI 25.67 kg/m   Wt Readings from Last 3 Encounters:  03/29/20 147 lb 6.4 oz (66.9 kg)  12/31/19 156 lb 3.2 oz (70.9 kg)  09/28/19 158 lb 12.8 oz (72 kg)    Physical Exam Vitals and nursing note reviewed.  Constitutional:      General: She is awake. She is not in acute distress.    Appearance: She is well-developed and well-groomed. She is not ill-appearing.  HENT:  Head: Normocephalic and atraumatic.     Right Ear: Hearing normal.     Left Ear: Hearing normal.  Eyes:     General: Lids are normal.        Right eye: No foreign body, discharge or hordeolum.        Left eye: No foreign body, discharge or hordeolum.     Extraocular Movements: Extraocular movements intact.     Conjunctiva/sclera:     Right eye: Right conjunctiva is injected.     Left eye: Left conjunctiva is injected.     Pupils: Pupils are equal, round, and reactive to light.     Visual Fields: Right eye visual fields normal and left eye visual fields normal.  Neck:      Thyroid: No thyromegaly.     Vascular: No carotid bruit or JVD.  Cardiovascular:     Rate and Rhythm: Normal rate and regular rhythm.     Heart sounds: Normal heart sounds. No murmur heard.  No gallop.   Pulmonary:     Effort: Pulmonary effort is normal.     Breath sounds: Normal breath sounds.  Abdominal:     General: Bowel sounds are normal.     Palpations: Abdomen is soft. There is no hepatomegaly or splenomegaly.  Musculoskeletal:     Cervical back: Normal range of motion and neck supple.     Right lower leg: No edema.     Left lower leg: No edema.  Lymphadenopathy:     Cervical: No cervical adenopathy.  Skin:    General: Skin is warm and dry.  Neurological:     Mental Status: She is alert and oriented to person, place, and time.  Psychiatric:        Attention and Perception: Attention normal.        Mood and Affect: Mood normal.        Behavior: Behavior normal. Behavior is cooperative.        Thought Content: Thought content normal.        Judgment: Judgment normal.    Results for orders placed or performed in visit on 12/31/19  Microscopic Examination   Urine  Result Value Ref Range   WBC, UA >30 (A) 0 - 5 /hpf   RBC 3-10 (A) 0 - 2 /hpf   Epithelial Cells (non renal) 0-10 0 - 10 /hpf   Mucus, UA Present Not Estab.   Bacteria, UA Moderate (A) None seen/Few  Urine Culture, Reflex   Urine  Result Value Ref Range   Urine Culture, Routine Final report    Organism ID, Bacteria Comment   UA/M w/rflx Culture, Routine   Specimen: Urine   Urine  Result Value Ref Range   Specific Gravity, UA 1.015 1.005 - 1.030   pH, UA 5.5 5.0 - 7.5   Color, UA Yellow Yellow   Appearance Ur Cloudy (A) Clear   Leukocytes,UA 3+ (A) Negative   Protein,UA Negative Negative/Trace   Glucose, UA Negative Negative   Ketones, UA Negative Negative   RBC, UA Trace (A) Negative   Bilirubin, UA Negative Negative   Urobilinogen, Ur 0.2 0.2 - 1.0 mg/dL   Nitrite, UA Negative Negative    Microscopic Examination See below:    Urinalysis Reflex Comment       Assessment & Plan:   Problem List Items Addressed This Visit      Cardiovascular and Mediastinum   Hypertension - Primary    Chronic, stable with BP at goal.  Continue  current medication regimen and adjust as needed.  BMP and TSH today.  Recommend checking BP at home regularly + focus on DASH diet.  Refills sent in.  Return in 6 months.      Relevant Medications   amLODipine (NORVASC) 5 MG tablet   atorvastatin (LIPITOR) 10 MG tablet   Other Relevant Orders   Basic metabolic panel   TSH     Other   Hyperlipidemia    Chronic, ongoing.  Continue current medication regimen and adjust as needed.  Lipid panel today.      Relevant Medications   amLODipine (NORVASC) 5 MG tablet   atorvastatin (LIPITOR) 10 MG tablet   Other Relevant Orders   Lipid Panel w/o Chol/HDL Ratio   Elevated hemoglobin A1c    Recheck A1C today, no symptoms reported.      Relevant Orders   HgB A1c   Allergic conjunctivitis    Acute x 2 weeks.  Recommend she avoid Benadryl due to age.  Will send in scripts for Pataday drops and Claritin oral medication for her to use daily.  Educated her on this.  She is to return to office for worsening or ongoing symptoms.          Follow up plan: Return in about 6 months (around 09/26/2020) for HTN/HLD, MOOD.
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
## 2020-03-29 NOTE — Assessment & Plan Note (Signed)
Chronic, ongoing.  Continue current medication regimen and adjust as needed. Lipid panel today. 
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
## 2020-03-29 NOTE — Assessment & Plan Note (Signed)
Acute x 2 weeks.  Recommend she avoid Benadryl due to age.  Will send in scripts for Pataday drops and Claritin oral medication for her to use daily.  Educated her on this.  She is to return to office for worsening or ongoing symptoms.

## 2020-03-29 NOTE — Assessment & Plan Note (Signed)
Chronic, stable with BP at goal.  Continue current medication regimen and adjust as needed.  BMP and TSH today.  Recommend checking BP at home regularly + focus on DASH diet.  Refills sent in.  Return in 6 months.

## 2020-03-29 NOTE — Assessment & Plan Note (Signed)
Recheck A1C today, no symptoms reported. 

## 2020-03-30 LAB — LIPID PANEL W/O CHOL/HDL RATIO
Cholesterol, Total: 142 mg/dL (ref 100–199)
HDL: 41 mg/dL (ref >39)
LDL Chol Calc (NIH): 70 mg/dL (ref 0–99)
Triglycerides: 182 mg/dL — ABNORMAL HIGH (ref 0–149)
VLDL Cholesterol Cal: 31 mg/dL (ref 5–40)

## 2020-03-30 LAB — BASIC METABOLIC PANEL
BUN/Creatinine Ratio: 16 (ref 12–28)
BUN: 11 mg/dL (ref 8–27)
CO2: 23 mmol/L (ref 20–29)
Calcium: 9.8 mg/dL (ref 8.7–10.3)
Chloride: 103 mmol/L (ref 96–106)
Creatinine, Ser: 0.67 mg/dL (ref 0.57–1.00)
GFR calc Af Amer: 98 mL/min/{1.73_m2} (ref >59)
GFR calc non Af Amer: 85 mL/min/{1.73_m2} (ref >59)
Glucose: 78 mg/dL (ref 65–99)
Potassium: 4.1 mmol/L (ref 3.5–5.2)
Sodium: 141 mmol/L (ref 134–144)

## 2020-03-30 LAB — HEMOGLOBIN A1C
Est. average glucose Bld gHb Est-mCnc: 123 mg/dL
Hgb A1c MFr Bld: 5.9 % — ABNORMAL HIGH (ref 4.8–5.6)

## 2020-03-30 LAB — TSH: TSH: 3.34 u[IU]/mL (ref 0.450–4.500)

## 2020-03-30 NOTE — Progress Notes (Signed)
Good morning, please let Tina Hardy know her labs have returned: - A1C remains 5.9%, no increase, staying in prediabetic range.  Any number 5.7 to 6.4 is considered prediabetes and any number 6.5 or greater is considered diabetes.   I would recommend heavy focus on decreasing foods high in sugar and your intake of things like bread products, pasta, and rice.  The American Diabetes Association online has a large amount of information on diet changes to make.  We will recheck this number in 6 months. - Cholesterol levels, show good LDL (bad cholesterol), but mild elevation in triglycerides.  Continue daily Atorvastatin and maybe add on some fish oil daily. - Thyroid and kidney function are normal.  If any questions let me know.   Keep being awesome!!  Thank you for allowing me to participate in your care. Kindest regards, Oberon Hehir

## 2020-04-01 ENCOUNTER — Telehealth: Payer: Self-pay

## 2020-04-01 NOTE — Telephone Encounter (Signed)
    MA11/04/2020 1st Attempt  Name: Tina Hardy   MRN: [id number]   DOB: 1941-02-26   AGE: 79 y.o.   GENDER: female   PCP Venita Lick, NP.   04/01/20 Spoke with patient about local food pantries and dial a ride transportation. I will follow-up with patient within the next 7 days.    Saina Waage, AAS Paralegal, [place] . Embedded Care Coordination [facility]  Care Management  [address], [place] [address] [email or web address]  [phone]   [email or web address]

## 2020-04-04 ENCOUNTER — Telehealth: Payer: Self-pay

## 2020-04-04 NOTE — Telephone Encounter (Signed)
    MA11/15/2021  Name: Tina Hardy   MRN: [id number]   DOB: [birth year]   AGE: 79 y.o.   GENDER: female   PCP Venita Lick, NP.   04/04/20 Left message  on voicemail for patient to return my call regarding transportation and food banks.  I will call patient again later this week.    Bassam Dresch, AAS Paralegal, [place] . Embedded Care Coordination [facility]  Care Management  [address], [place] [address] [email or web address]  [phone]   [email or web address]

## 2020-04-04 NOTE — Chronic Care Management (AMB) (Deleted)
Chronic Care Management Pharmacy  Name: Elisa Kutner  MRN: [id number] DOB: 15-Jul-1940   Chief Complaint/ HPI  Tina Hardy,  79 y.o. , female presents for their Follow-Up CCM visit with the clinical pharmacist via telephone.  PCP : Venita Lick, NP Patient Care Team: Venita Lick, NP as PCP - General (Nurse Practitioner) Lafayette Dragon, MD (Inactive) as Consulting Physician (Gastroenterology) Greg Cutter, LCSW as Social Worker (Licensed Clinical Social Worker) Hall Busing, Nobie Putnam, RN as Case Manager ([place]) Vladimir Faster, [facility] as Pharmacist (Pharmacist)  Their chronic conditions include: Hypertension, Hyperlipidemia, Coronary Artery Disease, Anxiety and Elevated A1C   Office Visits: 03/29/20- [name]- blood work, olopatadine, loratadine- allergic conjucntivitis  Consult Visit: ***  Allergies  Allergen Reactions  . Codeine Other (See Comments)    Unknown reaction   . Penicillins Other (See Comments)    Unknown reaction  Has patient had a PCN reaction causing immediate rash, facial/tongue/throat swelling, SOB or lightheadedness with hypotension: n/a Has patient had a PCN reaction causing severe rash involving mucus membranes or skin necrosis: n/a Has patient had a PCN reaction that required hospitalization: n/a Has patient had a PCN reaction occurring within the last 10 years: n/a If all of the above answers are "NO", then may proceed with Cephalosporin use.     Medications: Outpatient Encounter Medications as of 04/04/2020  Medication Sig Note  . amLODipine (NORVASC) 5 MG tablet Take 1 tablet (5 mg total) by mouth daily.   Marland Kitchen aspirin EC 81 MG EC tablet Take 1 tablet (81 mg total) by mouth daily.   Marland Kitchen atorvastatin (LIPITOR) 10 MG tablet Take 1 tablet (10 mg total) by mouth daily.   . busPIRone (BUSPAR) 5 MG tablet TAKE 1 TABLET BY MOUTH TWICE DAILY   . clopidogrel (PLAVIX) 75 MG tablet Take 1 tablet (75 mg total) by mouth daily at 6 (six) AM.    . Cyanocobalamin (VITAMIN B-12 PO) Take 1 drop by mouth daily as needed (ENERGY SUPPORT).   Marland Kitchen loratadine (CLARITIN) 10 MG tablet Take 1 tablet (10 mg total) by mouth daily.   . Multiple Vitamins-Minerals (ADULT GUMMY PO) Take 2 tablets by mouth daily.  07/29/2019: As needed   . olopatadine (PATADAY) 0.1 % ophthalmic solution Place 1 drop into both eyes 2 (two) times daily.    No facility-administered encounter medications on file as of 04/04/2020.    Wt Readings from Last 3 Encounters:  03/29/20 147 lb 6.4 oz (66.9 kg)  12/31/19 156 lb 3.2 oz (70.9 kg)  09/28/19 158 lb 12.8 oz (72 kg)    Current Diagnosis/Assessment:    Goals Addressed   None     Hypertension   BP goal is:  {CHL HP UPSTREAM Pharmacist BP ranges:[phone]}  Office blood pressures are  BP Readings from Last 3 Encounters:  03/29/20 114/72  12/31/19 105/68  09/28/19 124/76   Patient checks BP at home {CHL HP BP Monitoring [phone]} Patient home BP readings are ranging: ***  Patient has failed these meds in the past: *** Patient is currently {CHL Controlled/Uncontrolled:([phone]} on the following medications:  . ***  We discussed {CHL HP Upstream Pharmacy discussion:[phone]}  Plan  Continue {CHL HP Upstream Pharmacy [phone]}     Hyperlipidemia   LDL goal < ***  Last lipids Lab Results  Component Value Date   CHOL 142 03/29/2020   HDL 41 03/29/2020   LDLCALC 70 03/29/2020   TRIG 182 (H) 03/29/2020   CHOLHDL 5.2 02/16/2019  Hepatic Function Latest Ref Rng & Units 05/26/2019 02/16/2019 07/04/2017  Total Protein 6.0 - 8.5 g/dL 6.7 6.9 7.9  Albumin 3.7 - 4.7 g/dL 4.3 4.0 3.8  AST 0 - 40 IU/L 21 23 21   ALT 0 - 32 IU/L 24 21 21   Alk Phosphatase 39 - 117 IU/L 88 68 88  Total Bilirubin 0.0 - 1.2 mg/dL 0.4 0.8 0.8  Bilirubin, Direct 0.0 - 0.3 mg/dL - - -     The 10-year ASCVD risk score Mikey Bussing DC Jr., et al., 2013) is: 20.3%   Values used to calculate the score:      Age: 50 years     Sex: Female     Is Non-Hispanic African American: No     Diabetic: No     Tobacco smoker: No     Systolic Blood Pressure: 005 mmHg     Is BP treated: Yes     HDL Cholesterol: 41 mg/dL     Total Cholesterol: 142 mg/dL   Patient has failed these meds in past: *** Patient is currently {CHL Controlled/[phone]} on the following medications:  . ***  We discussed:  {CHL HP Upstream Pharmacy discussion:[phone]}  Plan  Continue {CHL HP Upstream Pharmacy Plans:[phone]}    Medication Management   Pt uses *** pharmacy for all medications Uses pill box? {Yes or If no, why not?:20788} Pt endorses ***% compliance  We discussed: {Pharmacy options:24294}  Plan  {US Pharmacy WBLT:02890}    Follow up: *** month phone visit  ***

## 2020-04-06 ENCOUNTER — Telehealth: Payer: Self-pay

## 2020-04-06 NOTE — Telephone Encounter (Signed)
    MA11/17/2021 2nd Attempt  Name: Tina Hardy   MRN: [id number]   DOB: November 02, 1940   AGE: 79 y.o.   GENDER: female   PCP Venita Lick, NP.   04/06/20  Left message  on voicemail for patient to return my call regarding transportation and food banks.  I will call patient this week.   Kelani Robart, AAS Paralegal, [place] . Embedded Care Coordination [facility] Health  Care Management  [address], [place] [address] [email or web address]  [phone]   [email or web address]

## 2020-04-07 ENCOUNTER — Telehealth: Payer: Self-pay

## 2020-04-07 NOTE — Telephone Encounter (Signed)
    MA11/18/2021 3rd Attempt  Name: Tina Hardy   MRN: [id number]   DOB: 1940/06/11   AGE: 79 y.o.   GENDER: female   PCP Venita Lick, NP.   04/07/20 Left message  on voicemail for patient to return my call regarding transportation and food bank resources given.  3rd attempt to follow-up with patient unable to contact closing referral.    Armarion Greek, AAS Paralegal, [place] . Embedded Care Coordination [facility] Health  Care Management  [address], [place] [address] [name]  [phone]   [email or web address]

## 2020-04-22 ENCOUNTER — Telehealth: Payer: Self-pay | Admitting: Pharmacist

## 2020-04-22 NOTE — Chronic Care Management (AMB) (Signed)
    Chronic Care Management Pharmacy Assistant   Name: Tina Hardy  MRN: [id number] DOB: 02-05-1941  Reason for Encounter: Follow-up regarding Coordination for LIS Assistance   PCP : Venita Lick, NP  Allergies:   Allergies  Allergen Reactions  . Codeine Other (See Comments)    Unknown reaction   . Penicillins Other (See Comments)    Unknown reaction  Has patient had a PCN reaction causing immediate rash, facial/tongue/throat swelling, SOB or lightheadedness with hypotension: n/a Has patient had a PCN reaction causing severe rash involving mucus membranes or skin necrosis: n/a Has patient had a PCN reaction that required hospitalization: n/a Has patient had a PCN reaction occurring within the last 10 years: n/a If all of the above answers are "NO", then may proceed with Cephalosporin use.     Medications: Outpatient Encounter Medications as of 04/22/2020  Medication Sig Note  . amLODipine (NORVASC) 5 MG tablet Take 1 tablet (5 mg total) by mouth daily.   Marland Kitchen aspirin EC 81 MG EC tablet Take 1 tablet (81 mg total) by mouth daily.   Marland Kitchen atorvastatin (LIPITOR) 10 MG tablet Take 1 tablet (10 mg total) by mouth daily.   . busPIRone (BUSPAR) 5 MG tablet TAKE 1 TABLET BY MOUTH TWICE DAILY   . clopidogrel (PLAVIX) 75 MG tablet Take 1 tablet (75 mg total) by mouth daily at 6 (six) AM.   . Cyanocobalamin (VITAMIN B-12 PO) Take 1 drop by mouth daily as needed (ENERGY SUPPORT).   Marland Kitchen loratadine (CLARITIN) 10 MG tablet Take 1 tablet (10 mg total) by mouth daily.   . Multiple Vitamins-Minerals (ADULT GUMMY PO) Take 2 tablets by mouth daily.  07/29/2019: As needed   . olopatadine (PATADAY) 0.1 % ophthalmic solution Place 1 drop into both eyes 2 (two) times daily.    No facility-administered encounter medications on file as of 04/22/2020.    Current Diagnosis: Patient Active Problem List   Diagnosis Date Noted  . Allergic conjunctivitis 03/29/2020  . Elevated hemoglobin A1c 05/26/2019   . Aortic valve sclerosis 04/24/2019  . Situational anxiety 04/24/2019  . Stenosis of left carotid artery 03/23/2019  . Insomnia 03/23/2019  . Marijuana use 03/23/2019  . Hypertension 03/22/2019  . Hyperlipidemia 03/22/2019  . TIA (transient ischemic attack) 02/16/2019  . 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 Hepatic hemangioma 07/16/2012  . Diverticulosis of colon 06/22/2008  . COLONIC POLYPS, HYPERPLASTIC, HX OF 06/22/2008    Goals Addressed   None    04/22/2020-CPA called and attempted to speak with the patient's son [name]). [name] son advised to CPA "this was not a good time to talk."  CPA verbalized understanding.  04/26/2020- CPA attempted to call the patient's home so she may inform Mr.Siebrandt I was trying to initiate LIS assistance through telephone call. No answer; left a HIPAA compliant voicemail for a call back.  04/28/2020- Attempted to call Mr. Siebrandt to discuss LIS assistance. No answer; left a voicemail.  05/02/2020- CPA made a third attempt to outreach [name]. No answer; left a HIPAA compliant voicemail.   Raynelle Highland, [place] Assistant [phone]  Follow-Up:  Pharmacist Review and Patient Assistance Coordination

## 2020-04-23 ENCOUNTER — Other Ambulatory Visit: Payer: Self-pay | Admitting: Nurse Practitioner

## 2020-04-23 NOTE — Telephone Encounter (Signed)
Requested Prescriptions  Pending Prescriptions Disp Refills  . busPIRone (BUSPAR) 5 MG tablet [Pharmacy Med Name: BUSPIRONE HCL 5 MG TAB] 120 tablet 1    Sig: TAKE 1 TABLET BY MOUTH TWICE DAILY     Psychiatry: Anxiolytics/Hypnotics - Non-controlled Passed - 04/23/2020 11:33 AM      Passed - Valid encounter within last 6 months    Recent Outpatient Visits          3 weeks ago Primary hypertension   [place], Henrine Screws T, NP   3 months ago [place] Eulogio Bear, NP   6 months ago Essential hypertension   [place], Barbaraann Faster, NP   8 months ago Situational anxiety   [place], Garrett T, NP   8 months ago Situational anxiety   [place], Barbaraann Faster, NP      Future Appointments            In 5 months Cannady, Barbaraann Faster, NP [employer], [facility]

## 2020-05-04 ENCOUNTER — Ambulatory Visit: Payer: Medicare Other | Admitting: Licensed Clinical Social Worker

## 2020-05-04 DIAGNOSIS — I10 Essential (primary) hypertension: Secondary | ICD-10-CM

## 2020-05-04 DIAGNOSIS — F5104 Psychophysiologic insomnia: Secondary | ICD-10-CM

## 2020-05-04 DIAGNOSIS — F418 Other specified anxiety disorders: Secondary | ICD-10-CM

## 2020-05-04 DIAGNOSIS — F1290 Cannabis use, unspecified, uncomplicated: Secondary | ICD-10-CM

## 2020-05-04 NOTE — Chronic Care Management (AMB) (Signed)
Chronic Care Management    Clinical Social Work Follow Up Note  05/04/2020 Name: Tina Hardy MRN: [id number] DOB: 12/25/1940  Tina Hardy is a 79 y.o. year old female who is a primary care patient of Cannady, Tina Faster, NP. The CCM team was consulted for assistance with Mental Health Counseling and Resources.   Review of patient status, including review of consultants reports, other relevant assessments, and collaboration with appropriate care team members and the patient's provider was performed as part of comprehensive patient evaluation and provision of chronic care management services.    SDOH (Social Determinants of Health) assessments performed: Yes    Outpatient Encounter Medications as of 05/04/2020  Medication Sig Note  . amLODipine (NORVASC) 5 MG tablet Take 1 tablet (5 mg total) by mouth daily.   Marland Kitchen aspirin EC 81 MG EC tablet Take 1 tablet (81 mg total) by mouth daily.   Marland Kitchen atorvastatin (LIPITOR) 10 MG tablet Take 1 tablet (10 mg total) by mouth daily.   . busPIRone (BUSPAR) 5 MG tablet TAKE 1 TABLET BY MOUTH TWICE DAILY   . clopidogrel (PLAVIX) 75 MG tablet Take 1 tablet (75 mg total) by mouth daily at 6 (six) AM.   . Cyanocobalamin (VITAMIN B-12 PO) Take 1 drop by mouth daily as needed (ENERGY SUPPORT).   Marland Kitchen loratadine (CLARITIN) 10 MG tablet Take 1 tablet (10 mg total) by mouth daily.   . Multiple Vitamins-Minerals (ADULT GUMMY PO) Take 2 tablets by mouth daily.  07/29/2019: As needed   . olopatadine (PATADAY) 0.1 % ophthalmic solution Place 1 drop into both eyes 2 (two) times daily.    No facility-administered encounter medications on file as of 05/04/2020.     Goals Addressed    . -SWManage My Emotions       Timeframe:  Long-Range Goal Priority:  Medium Start Date:  05/04/20                           Expected End Date:    08/02/20                   Follow Up Date- 90 days from 05/04/20   - begin personal counseling - call and visit an old friend - check out  volunteer opportunities - join a support group - laugh; watch a funny movie or comedian - learn and use visualization or guided imagery - perform a random act of kindness - practice relaxation or meditation daily - start or continue a personal journal - talk about feelings with a friend, family or spiritual advisor - practice positive thinking and self-talk    Why is this important?    When you are stressed, down or upset, your body reacts too.   For example, your blood pressure may get higher; you may have a headache or stomachache.   When your emotions get the best of you, your body's ability to fight off cold and flu gets weak.   These steps will help you manage your emotions.    Current Barriers:  . Chronic Mental Health needs related to generalized anxiety disorder . Mental Health Concerns  . Social Isolation . Transportation Barriers-unable to get to appointments as her car no longer works. Patient has [employer] Benefits . Suicidal Ideation/Homicidal Ideation: No  Clinical Social Work Goal(s):  Marland Kitchen Over the next 120 days, patient will work with SW bi-monthly by telephone or in person to reduce or manage symptoms related to anxiety  and stress  . Over the next 120 days, patient will demonstrate improved health management independence as evidenced byimplementing appropriate anxiety management coping skills and self-care into her daily routine to combat future symptoms  Interventions: . Patient interviewed and appropriate assessments performed: brief mental health assessment . Provided mental health counseling with regard to anxiety. Patient was educated on coping skills to implement when anxiety is triggered. Patient reports that she is managing her anxiety very well through her spiritual practices.  . Discussed plans with patient for ongoing care management follow up and provided patient with direct contact information for care management team . Advised patient to contact [facility] to schedule appointment as referral has been completed. UPDATE-Patient does not want to implement mental health support at this time. Patient shares that she is able to effectively manage her symptoms on her own at this time.  Marland Kitchen LCSW completed past referral to [facility] re: *transportation assistance needs. [name] son uses her car in order to get to and from work. Patient is now using [employer] for transportation to medical appointments. LCSW provided patient with information on [facility] [employer]. Patient will call [phone] to get set up with their transportation services.  . Assisted patient/caregiver with obtaining information about health plan benefits . Provided education and assistance to client regarding Advanced Directives. . Patient lost her spouse 3 years ago but declines needing any grief support at this time (during the holidays.) LCSW provided grief support education in the case that she changes her mind.  . Emotional/Supportive Counseling provided during session. Patient was receptive to anxiety management education.  Marland Kitchen 1:1 collaboration with PCP regarding development and update of comprehensive plan of care as evidenced by provider attestation and co-signature  Patient Self Care Activities:  . Attends all scheduled appointments . Motivation for mental health treatment  Patient Coping Strengths:  . Spirituality . Hopefulness . Self Advocate . Able to Communicate Effectively  Patient Self Care Deficits:  . Lacks social connections  10 LITTLE Things To Do When You're Feeling Too Down To Do Anything  Take a shower. Even if you plan to stay in all day long and not see a soul, take a shower. It takes the most effort to hop in to the shower but once you do, you'll feel immediate results. It will wake you up and you'll be feeling much fresher (and cleaner too).  Brush and floss your teeth. Give your teeth a good brushing with a floss finish. It's a small task  but it feels so good and you can check 'taking care of your health' off the list of things to do.  Do something small on your list. Most of [place] have some small thing we would like to get done (load of laundry, sew a button, email a friend). Doing one of these things will make you feel like you've accomplished something.  Drink water. Drinking water is easy right? It's also really beneficial for your health so keep a glass beside you all day and take sips often. It gives you energy and prevents you from boredom eating.  Do some floor exercises. The last thing you want to do is exercise but it might be just the thing you need the most. Keep it simple and do exercises that involve sitting or laying on the floor. Even the smallest of exercises release chemicals in the brain that make you feel good. Yoga stretches or core exercises are going to make you feel good with minimal effort.  Make your bed. Making your bed takes a few minutes but it's productive and you'll feel relieved when it's done. An unmade bed is a huge visual reminder that you're having an unproductive day. Do it and consider it your housework for the day.  Put on some nice clothes. Take the sweatpants off even if you don't plan to go anywhere. Put on clothes that make you feel good. Take a look in the mirror so your brain recognizes the sweatpants have been replaced with clothes that make you look great. It's an instant confidence booster.  Wash the dishes. A pile of dirty dishes in the sink is a reflection of your mood. It's possible that if you wash up the dishes, your mood will follow suit. It's worth a try.  Cook a real meal. If you have the luxury to have a "do nothing" day, you have time to make a real meal for yourself. Make a meal that you love to eat. The process is good to get you out of the funk and the food will ensure you have more energy for tomorrow.  Write out your thoughts by hand. When you hand write, you  stimulate your brain to focus on the moment that you're in so make yourself comfortable and write whatever comes into your mind. Put those thoughts out on paper so they stop spinning around in your head. Those thoughts might be the very thing holding you down.     Patient Care Plan: General Social Work (Adult)    Problem Identified: Anxiety Identification (Anxiety)     Goal: Anxiety Symptoms Identified   Note:   Evidence-based guidance:   Assess for presence of additional co-occurring psychiatric comorbidity [e.g., substance use, other anxiety disorder (specific phobia, social anxiety disorder, panic disorder, agoraphobia, substance or medically-induced    anxiety disorder)].   Assess for presence of medical comorbidity (e.g., chronic pain, chronic illness), recent or recurrent trauma or abuse, family history of substance use disorder or mental illness.   Screen for anxiety using standardized, validated tool.   Move gradually from investigating somatic complaints to exploring social or psychologic distress.   Assess for signs and symptoms of anxiety in an atmosphere of hope and optimism.   Notes:    Task: Identify Anxiety Symptoms and Facilitate Treatment   Note:   Care Management Activities:    - participation in parent-based training encouraged - participation in psychiatric services encouraged - somatic and anxiety symptoms correlated    Notes:      Follow Up Plan: SW will follow up with patient by phone over the next quarter  Eula Fried, [name], MSW, [email or web address] Phone: [phone]

## 2020-05-24 ENCOUNTER — Ambulatory Visit: Payer: Self-pay | Admitting: General Practice

## 2020-05-24 ENCOUNTER — Telehealth: Payer: Self-pay | Admitting: General Practice

## 2020-05-24 DIAGNOSIS — F418 Other specified anxiety disorders: Secondary | ICD-10-CM

## 2020-05-24 DIAGNOSIS — I10 Essential (primary) hypertension: Secondary | ICD-10-CM

## 2020-05-24 DIAGNOSIS — E782 Mixed hyperlipidemia: Secondary | ICD-10-CM

## 2020-05-24 DIAGNOSIS — F1290 Cannabis use, unspecified, uncomplicated: Secondary | ICD-10-CM

## 2020-05-24 NOTE — Patient Instructions (Signed)
Visit Information  Patient Care Plan: General Social Work (Adult)    Problem Identified: Anxiety Identification (Anxiety)     Goal: Anxiety Symptoms Identified   Note:   Evidence-based guidance:   Assess for presence of additional co-occurring psychiatric comorbidity [e.g., substance use, other anxiety disorder (specific phobia, social anxiety disorder, panic disorder, agoraphobia, substance or medically-induced    anxiety disorder)].   Assess for presence of medical comorbidity (e.g., chronic pain, chronic illness), recent or recurrent trauma or abuse, family history of substance use disorder or mental illness.   Screen for anxiety using standardized, validated tool.   Move gradually from investigating somatic complaints to exploring social or psychologic distress.   Assess for signs and symptoms of anxiety in an atmosphere of hope and optimism.   Notes:    Task: Identify Anxiety Symptoms and Facilitate Treatment   Note:   Care Management Activities:    - participation in parent-based training encouraged - participation in psychiatric services encouraged - somatic and anxiety symptoms correlated    Notes:    Patient Care Plan: [name]: Cannibus Substance Misuse (Adult)    Problem Identified: [name]: Cannabis use/Addictive Behavior   Priority: Medium    Goal: [name]: Addictive Behavior Managed   Priority: Medium  Note:   Current Barriers:  Marland Kitchen Knowledge Deficits related to use of cannabis  . Lacks caregiver support.  . Film/video editor.  . Unable to independently control use of cannabis  . Does not adhere to provider recommendations re: refraining from cannabis  . Lacks social connections . Does not contact provider office for questions/concerns  Nurse Case Manager Clinical Goal(s):  Marland Kitchen Over the next 120 days, patient will verbalize understanding of plan for stopping the use of cannabis  . Over the next 120 days, patient will attend all scheduled medical appointments:  09-26-2020 . Over the next 120 days, patient will demonstrate improved adherence to prescribed treatment plan for 120 as evidenced byalternative method of dealing with anxiety.  Interventions:  . 1:1 collaboration with Venita Lick, NP regarding development and update of comprehensive plan of care as evidenced by provider attestation and co-signature . Inter-disciplinary care team collaboration (see longitudinal plan of care) . Evaluation of current treatment plan related to anxiety and use of cannabis for relief and patient's adherence to plan as established by provider. . Advised patient to talk to pcp about alternatives to anxiety management and not using cannabis  . Provided education to patient re: the effects of cannabis  . Collaborated with pcp regarding the patient stated she has a hard time managing her anxiety unless she uses cannabis. Out right now and said that she is open to taking medication for anxiety. Education on discussing options with pcp.  . Discussed plans with patient for ongoing care management follow up and provided patient with direct contact information for care management team . Reviewed scheduled/upcoming provider appointments including: 09-26-2020  Patient Goals/Self-Care Activities Over the next 120 days, patient will:  - Patient will attend all scheduled provider appointments Patient will call provider office for new concerns or questions Patient will work with [name] to address care coordination needs and will continue to work with the clinical team to address health care and disease management related needs.   - brief intervention provided - decision-making supported - family stress acknowledged - positive reinforcement provided - problem-solving facilitated - resources required to manage barriers identified - safety net resources provided - verbalization of feelings encouraged  Follow Up Plan: Telephone follow up appointment  with care management team member  scheduled for: 07-26-2020 at 0900 am       Task: [name]: Alleviate Barriers to Substance Misuse Treatment   Note:   Care Management Activities:    - brief intervention provided - decision-making supported - family stress acknowledged - positive reinforcement provided - problem-solving facilitated - resources required to manage barriers identified - safety net resources provided - verbalization of feelings encouraged       Patient Care Plan: [name]: Hypertension (Adult)    Problem Identified: [name]: Hypertension (Hypertension)   Priority: Medium    Goal: [name]: Hypertension Monitored   Priority: Medium  Note:   Objective:  . Last practice recorded BP readings:  BP Readings from Last 3 Encounters:  03/29/20 114/72  12/31/19 105/68  09/28/19 124/76 .   Marland Kitchen Most recent eGFR/CrCl: No results found for: EGFR  No components found for: CRCL Current Barriers:  Marland Kitchen Knowledge Deficits related to basic understanding of hypertension pathophysiology and self care management . Knowledge Deficits related to understanding of medications prescribed for management of hypertension . Transportation barriers . Limited Social Support . Film/video editor.  . Unable to independently manage HTN and chronic conditions . Lacks social connections . Does not contact provider office for questions/concerns Case Manager Clinical Goal(s):  Marland Kitchen Over the next 120 days, patient will verbalize understanding of plan for hypertension management . Over the next 120 days, patient will attend all scheduled medical appointments: pcp on 09-26-2020 . Over the next 120 days, patient will demonstrate improved adherence to prescribed treatment plan for hypertension as evidenced by taking all medications as prescribed, monitoring and recording blood pressure as directed, adhering to low sodium/DASH diet . Over the next 120 days, patient will demonstrate improved health management independence as evidenced by checking blood  pressure as directed and notifying PCP if SBP>160 or DBP > 90, taking all medications as prescribe, and adhering to a low sodium diet as discussed. Interventions:  Marland Kitchen UNABLE to independently:mange HTN . Evaluation of current treatment plan related to hypertension self management and patient's adherence to plan as established by provider. . Provided education to patient re: stroke prevention, s/s of heart attack and stroke, DASH diet, complications of uncontrolled blood pressure . Reviewed medications with patient and discussed importance of compliance . Discussed plans with patient for ongoing care management follow up and provided patient with direct contact information for care management team . Advised patient, providing education and rationale, to monitor blood pressure daily and record, calling PCP for findings outside established parameters. The patient is currently not taking blood pressures at home and does not feel like she needs to do so.  . Reviewed scheduled/upcoming provider appointments including: 09-26-2020 Patient Goals/Self-Care Activities . Over the next 120 days, patient will:  - UNABLE to independently manage HTN Attends all scheduled provider appointments Calls provider office for new concerns, questions, or BP outside discussed parameters Checks BP and records as discussed- does not check blood pressures at home Follows a low sodium diet/DASH diet - depression screen reviewed - home or ambulatory blood pressure monitoring encouraged Follow Up Plan: Telephone follow up appointment with care management team member scheduled for: 07-26-2020 at 0900 am   Task: [name]: Identify and Monitor Blood Pressure Elevation   Note:   Care Management Activities:    - depression screen reviewed - home or ambulatory blood pressure monitoring encouraged    Notes: Does not take blood pressures at home   Patient Care Plan: [name]: Anxiety  Problem Identified: [name]: Axniety and patient  managing with Cannabis   Priority: Medium    Goal: [name]: Anxiety and Cannabis Drug Use Managed   Priority: Medium  Note:   Current Barriers:  Marland Kitchen Knowledge Deficits related to use of Cannabis for control of anxiety . Chronic Disease Management support and education needs related to anxiety . Lacks caregiver support.  . Film/video editor.  . Non-adherence to prescribed medication regimen . Unable to independently manage anxiety . Does not adhere to provider recommendations re: refrain from the use of cannabis to help anxiety . Does not contact provider office for questions/concerns  Nurse Case Manager Clinical Goal(s):  Marland Kitchen Over the next 120 days, patient will verbalize understanding of plan for management of anxiety without the use of cannabis  . Over the next 120 days, patient will work with [facility] [place] team and pcp to address needs related to anxiety and how to effectively manage with alternative methods other than substance abuse.  . Over the next 120 days, patient will demonstrate a decrease in anxiety exacerbations as evidenced by controlled anxiety . Over the next 120 days, patient will attend all scheduled medical appointments: appointment with the pcp on 09-26-2020  Interventions:  . 1:1 collaboration with Venita Lick, NP regarding development and update of comprehensive plan of care as evidenced by provider attestation and co-signature . Inter-disciplinary care team collaboration (see longitudinal plan of care) . Provided education to patient re: alternative methods for dealing with anxiety . Discussed plans with patient for ongoing care management follow up and provided patient with direct contact information for care management team . Provided patient with mindfullness educational materials related to helping decrease anxiety. The patient states her son is researching information from a Dr. Ardeth Perfect about how to reduce Lithium in the diet and wanted the pcp to know.  . Reviewed  scheduled/upcoming provider appointments including: 09-26-2020  Patient Goals/Self-Care Activities Over the next 120 days, patient will:  - Patient will self administer medications as prescribed Patient will attend all scheduled provider appointments Patient will call pharmacy for medication refills Patient will call provider office for new concerns or questions Patient will work with [name] to address care coordination needs and will continue to work with the clinical team to address health care and disease management related needs.   - barriers to change identified - brief intervention provided - counseling provided - stage of change monitored - substance misuse monitored *Uses cannabis regularly but is out right now.    Follow Up Plan: Telephone follow up appointment with care management team member scheduled for: 07-26-2020 at 0900       Task: [name]: Identify and Promote Change to Drug Misuse   Note:   Care Management Activities:    - barriers to change identified - brief intervention provided - counseling provided - stage of change monitored - substance misuse monitored    Notes: uses cannabis regularly but says she is out right now.     Patient Care Plan: [name]: HLD Management    Problem Identified: [name]: Health Promotion or Disease Self-Management (General Plan of Care)   Priority: High    Long-Range Goal: [name]: HLD Self-Management Plan Developed   Expected End Date: 09/21/2020  Note:   Current Barriers:  . Poorly controlled hyperlipidemia, complicated by HTN, non-compliance and anxiety . Current antihyperlipidemic regimen: Lipitor 30m QD . Most recent lipid panel:     Component Value Date/Time   CHOL 142 03/29/2020 0923   TRIG 182 (H)  03/29/2020 0923   HDL 41 03/29/2020 0923   CHOLHDL 5.2 02/16/2019 1945   VLDL 52 (H) 02/16/2019 1945   LDLCALC 70 03/29/2020 0923 .   Marland Kitchen ASCVD risk enhancing conditions: age >29, pre-DM, HTN, Cannabis use, former smoker . Unable to  independently manage HLD . Does not adhere to provider recommendations re:  Marland Kitchen Does not contact provider office for questions/concerns  RN Care Manager Clinical Goal(s):  Marland Kitchen Over the next 120 days, patient will work with Consulting civil engineer, providers, and care team towards execution of optimized self-health management plan . Over the next 120 days, patient will verbalize understanding of plan for HLD management  . Over the next 120 days, patient will attend all scheduled medical appointments: 09-26-2020 with pcp . Over the next 120 days, patient will demonstrate improved adherence to prescribed treatment plan for HLD as evidenced bykeeping appointments, compliance with the plan of care and calling for changes   Interventions: Marland Kitchen Medication review performed; medication list updated in electronic medical record.  Bertram Savin care team collaboration (see longitudinal plan of care) . Referred to pharmacy team for assistance with HLD medication management . Evaluation of current treatment plan related to HLD and patient's adherence to plan as established by provider. . Advised patient to call the provider for changes in condition or questions about health and wellness.  . Provided education to patient re: benefits of following a heart healthy diet and checking blood pressures on a regular basis . Reviewed medications with patient and discussed compliance  . Discussed plans with patient for ongoing care management follow up and provided patient with direct contact information for care management team . Reviewed scheduled/upcoming provider appointments including: 09-26-2020 . Education on active life style and benefits of staying active for health and well being.   Patient Goals/Self-Care Activities: . Over the next 120 days, patient will:   - call for medicine refill 2 or 3 days before it runs out - call if I am sick and can't take my medicine - keep a list of all the medicines I take; vitamins  and herbals too - learn to read medicine labels - use a pillbox to sort medicine - use an alarm clock or phone to remind me to take my medicine - drink 6 to 8 glasses of water each day - eat 3 to 5 servings of fruits and vegetables each day - eat 5 or 6 small meals each day - fill half the plate with nonstarchy vegetables - limit fast food meals to no more than 1 per week - manage portion size - prepare main meal at home 3 to 5 days each week - read food labels for fat, fiber, carbohydrates and portion size - set a realistic goal - switch to sugar-free drinks - be open to making changes - I can manage, know and watch for signs of a heart attack - if I have chest pain, call for help - learn about small changes that will make a big difference - learn my personal risk factors  - barriers to meeting goals identified - change-talk evoked - choices provided - collaboration with team encouraged - decision-making supported - difficulty of making life-long changes acknowledged - health risks reviewed - problem-solving facilitated - questions answered - readiness for change evaluated - reassurance provided - resources needed to meet goals identified - self-reflection promoted - self-reliance encouraged - verbalization of feelings encouraged  Follow Up Plan: Telephone follow up appointment with care management team member scheduled  for: 07-26-2020 at 0900 am     Task: [name]: HLD: Mutually Develop and Royce Macadamia Achievement of Patient Goals   Note:   Care Management Activities:    - barriers to meeting goals identified - change-talk evoked - choices provided - collaboration with team encouraged - decision-making supported - difficulty of making life-long changes acknowledged - health risks reviewed - problem-solving facilitated - questions answered - readiness for change evaluated - reassurance provided - resources needed to meet goals identified - self-reflection promoted -  self-reliance encouraged - verbalization of feelings encouraged         The patient verbalized understanding of instructions, educational materials, and care plan provided today and declined offer to receive copy of patient instructions, educational materials, and care plan.   Telephone follow up appointment with care management team member scheduled for: 07-26-2020 at 0900 am  Noreene Larsson RN, MSN, [facility] Mobile: [phone]
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
## 2020-05-30 ENCOUNTER — Telehealth: Payer: Self-pay

## 2020-06-15 ENCOUNTER — Ambulatory Visit: Payer: Medicare Other | Admitting: Nurse Practitioner

## 2020-06-15 ENCOUNTER — Other Ambulatory Visit: Payer: Self-pay

## 2020-06-16 ENCOUNTER — Ambulatory Visit: Payer: Medicare Other | Admitting: Nurse Practitioner

## 2020-06-18 ENCOUNTER — Encounter: Payer: Self-pay | Admitting: Nurse Practitioner

## 2020-06-18 DIAGNOSIS — M5136 Other intervertebral disc degeneration, lumbar region: Secondary | ICD-10-CM | POA: Insufficient documentation

## 2020-06-18 DIAGNOSIS — I700 Atherosclerosis of aorta: Secondary | ICD-10-CM | POA: Insufficient documentation

## 2020-06-18 DIAGNOSIS — Z8673 Personal history of transient ischemic attack (TIA), and cerebral infarction without residual deficits: Secondary | ICD-10-CM | POA: Insufficient documentation

## 2020-06-20 ENCOUNTER — Ambulatory Visit: Payer: Medicare Other | Admitting: Nurse Practitioner

## 2020-07-05 ENCOUNTER — Ambulatory Visit (INDEPENDENT_AMBULATORY_CARE_PROVIDER_SITE_OTHER): Payer: Medicare Other | Admitting: Nurse Practitioner

## 2020-07-05 ENCOUNTER — Other Ambulatory Visit: Payer: Self-pay

## 2020-07-05 ENCOUNTER — Encounter: Payer: Self-pay | Admitting: Nurse Practitioner

## 2020-07-05 VITALS — BP 128/78 | HR 93 | Temp 97.6°F | Wt 153.4 lb

## 2020-07-05 DIAGNOSIS — Z1159 Encounter for screening for other viral diseases: Secondary | ICD-10-CM

## 2020-07-05 DIAGNOSIS — F418 Other specified anxiety disorders: Secondary | ICD-10-CM

## 2020-07-05 DIAGNOSIS — Z8673 Personal history of transient ischemic attack (TIA), and cerebral infarction without residual deficits: Secondary | ICD-10-CM

## 2020-07-05 DIAGNOSIS — I6522 Occlusion and stenosis of left carotid artery: Secondary | ICD-10-CM

## 2020-07-05 DIAGNOSIS — R7309 Other abnormal glucose: Secondary | ICD-10-CM | POA: Diagnosis not present

## 2020-07-05 DIAGNOSIS — I700 Atherosclerosis of aorta: Secondary | ICD-10-CM

## 2020-07-05 DIAGNOSIS — E782 Mixed hyperlipidemia: Secondary | ICD-10-CM | POA: Diagnosis not present

## 2020-07-05 DIAGNOSIS — I10 Essential (primary) hypertension: Secondary | ICD-10-CM | POA: Diagnosis not present

## 2020-07-05 MED ORDER — ROSUVASTATIN CALCIUM 10 MG PO TABS: 10.0000 mg | ORAL_TABLET | Freq: Every day | ORAL | 3 refills | Status: DC

## 2020-07-05 NOTE — Patient Instructions (Addendum)
- STOP ATORVASTATIN AND START ROSUVASTATIN -- CHANGING MEDICATIONS TO HELP WITH SIDE EFFECTS.  NEED STATIN FOR STROKE PREVENTION. - TAKE BUSPAR ONLY IF YOU NEED IT FOR ANXIETY - START TAKING OVER THE COUNTER BIOTIN, IN VITAMIN SECTION, THIS CAN BE USED FOR HAIR GROWTH - KEEP TAKING AMLODIPINE, ASPIRIN, AND PLAVIX -- NEED FOR STROKE PREVENTION  DASH Eating Plan DASH stands for Dietary Approaches to Stop Hypertension. The DASH eating plan is a healthy eating plan that has been shown to:  Reduce high blood pressure (hypertension).  Reduce your risk for type 2 diabetes, heart disease, and stroke.  Help with weight loss. What are tips for following this plan? Reading food labels  Check food labels for the amount of salt (sodium) per serving. Choose foods with less than 5 percent of the Daily Value of sodium. Generally, foods with less than 300 milligrams (mg) of sodium per serving fit into this eating plan.  To find whole grains, look for the word "whole" as the first word in the ingredient list. Shopping  Buy products labeled as "low-sodium" or "no salt added."  Buy fresh foods. Avoid canned foods and pre-made or frozen meals. Cooking  Avoid adding salt when cooking. Use salt-free seasonings or herbs instead of table salt or sea salt. Check with your health care provider or pharmacist before using salt substitutes.  Do not fry foods. Cook foods using healthy methods such as baking, boiling, grilling, roasting, and broiling instead.  Cook with heart-healthy oils, such as olive, canola, avocado, soybean, or sunflower oil. Meal planning  Eat a balanced diet that includes: ? 4 or more servings of fruits and 4 or more servings of vegetables each day. Try to fill one-half of your plate with fruits and vegetables. ? 6-8 servings of whole grains each day. ? Less than 6 oz (170 g) of lean meat, poultry, or fish each day. A 3-oz (85-g) serving of meat is about the same size as a deck of cards.  One egg equals 1 oz (28 g). ? 2-3 servings of low-fat dairy each day. One serving is 1 cup (237 mL). ? 1 serving of nuts, seeds, or beans 5 times each week. ? 2-3 servings of heart-healthy fats. Healthy fats called omega-3 fatty acids are found in foods such as walnuts, flaxseeds, fortified milks, and eggs. These fats are also found in cold-water fish, such as sardines, salmon, and mackerel.  Limit how much you eat of: ? Canned or prepackaged foods. ? Food that is high in trans fat, such as some fried foods. ? Food that is high in saturated fat, such as fatty meat. ? Desserts and other sweets, sugary drinks, and other foods with added sugar. ? Full-fat dairy products.  Do not salt foods before eating.  Do not eat more than 4 egg yolks a week.  Try to eat at least 2 vegetarian meals a week.  Eat more home-cooked food and less restaurant, buffet, and fast food.   Lifestyle  When eating at a restaurant, ask that your food be prepared with less salt or no salt, if possible.  If you drink alcohol: ? Limit how much you use to:  0-1 drink a day for women who are not pregnant.  0-2 drinks a day for men. ? Be aware of how much alcohol is in your drink. In the [place] one drink equals one 12 oz bottle of beer (355 mL), one 5 oz glass of wine (148 mL), or one 1 oz glass of  hard liquor (44 mL). General information  Avoid eating more than 2,300 mg of salt a day. If you have hypertension, you may need to reduce your sodium intake to 1,500 mg a day.  Work with your health care provider to maintain a healthy body weight or to lose weight. Ask what an ideal weight is for you.  Get at least 30 minutes of exercise that causes your heart to beat faster (aerobic exercise) most days of the week. Activities may include walking, swimming, or biking.  Work with your health care provider or dietitian to adjust your eating plan to your individual calorie needs. What foods should I eat? Fruits All fresh,  dried, or frozen fruit. Canned fruit in natural juice (without added sugar). Vegetables Fresh or frozen vegetables (raw, steamed, roasted, or grilled). Low-sodium or reduced-sodium tomato and vegetable juice. Low-sodium or reduced-sodium tomato sauce and tomato paste. Low-sodium or reduced-sodium canned vegetables. Grains Whole-grain or whole-wheat bread. Whole-grain or whole-wheat pasta. Brown rice. [place]. Bulgur. Whole-grain and low-sodium cereals. Pita bread. Low-fat, low-sodium crackers. Whole-wheat flour tortillas. Meats and other proteins Skinless chicken or [place]. Ground chicken or [place]. Pork with fat trimmed off. Fish and seafood. Egg whites. Dried beans, peas, or lentils. Unsalted nuts, nut butters, and seeds. Unsalted canned beans. Lean cuts of beef with fat trimmed off. Low-sodium, lean precooked or cured meat, such as sausages or meat loaves. Dairy Low-fat (1%) or fat-free (skim) milk. Reduced-fat, low-fat, or fat-free cheeses. Nonfat, low-sodium ricotta or cottage cheese. Low-fat or nonfat yogurt. Low-fat, low-sodium cheese. Fats and oils Soft margarine without trans fats. Vegetable oil. Reduced-fat, low-fat, or light mayonnaise and salad dressings (reduced-sodium). Canola, safflower, olive, avocado, soybean, and sunflower oils. Avocado. Seasonings and condiments Herbs. Spices. Seasoning mixes without salt. Other foods Unsalted popcorn and pretzels. Fat-free sweets. The items listed above may not be a complete list of foods and beverages you can eat. Contact a dietitian for more information. What foods should I avoid? Fruits Canned fruit in a light or heavy syrup. Fried fruit. Fruit in cream or butter sauce. Vegetables Creamed or fried vegetables. Vegetables in a cheese sauce. Regular canned vegetables (not low-sodium or reduced-sodium). Regular canned tomato sauce and paste (not low-sodium or reduced-sodium). Regular tomato and vegetable juice (not low-sodium or  reduced-sodium). Angie Fava. Olives. Grains Baked goods made with fat, such as croissants, muffins, or some breads. Dry pasta or rice meal packs. Meats and other proteins Fatty cuts of meat. Ribs. Fried meat. Berniece Salines. Bologna, salami, and other precooked or cured meats, such as sausages or meat loaves. Fat from the back of a pig (fatback). Bratwurst. Salted nuts and seeds. Canned beans with added salt. Canned or smoked fish. Whole eggs or egg yolks. Chicken or [place] with skin. Dairy Whole or 2% milk, cream, and half-and-half. Whole or full-fat cream cheese. Whole-fat or sweetened yogurt. Full-fat cheese. Nondairy creamers. Whipped toppings. Processed cheese and cheese spreads. Fats and oils Butter. Stick margarine. Lard. Shortening. Ghee. Bacon fat. Tropical oils, such as coconut, palm kernel, or palm oil. Seasonings and condiments Onion salt, garlic salt, seasoned salt, table salt, and sea salt. Worcestershire sauce. Tartar sauce. Barbecue sauce. Teriyaki sauce. Soy sauce, including reduced-sodium. Steak sauce. Canned and packaged gravies. Fish sauce. Oyster sauce. Cocktail sauce. Store-bought horseradish. Ketchup. Mustard. Meat flavorings and tenderizers. Bouillon cubes. Hot sauces. Pre-made or packaged marinades. Pre-made or packaged taco seasonings. Relishes. Regular salad dressings. Other foods Salted popcorn and pretzels. The items listed above may not be a complete list of  foods and beverages you should avoid. Contact a dietitian for more information. Where to find more information  National Heart, Lung, and Blood Institute: https://wilson-eaton.com/  American Heart Association: www.heart.org  Academy of Nutrition and Dietetics: www.eatright.[place]: www.kidney.org Summary  The DASH eating plan is a healthy eating plan that has been shown to reduce high blood pressure (hypertension). It may also reduce your risk for type 2 diabetes, heart disease, and stroke.  When on  the DASH eating plan, aim to eat more fresh fruits and vegetables, whole grains, lean proteins, low-fat dairy, and heart-healthy fats.  With the DASH eating plan, you should limit salt (sodium) intake to 2,300 mg a day. If you have hypertension, you may need to reduce your sodium intake to 1,500 mg a day.  Work with your health care provider or dietitian to adjust your eating plan to your individual calorie needs. This information is not intended to replace advice given to you by your health care provider. Make sure you discuss any questions you have with your health care provider. Document Revised: 04/10/2019 Document Reviewed: 04/10/2019 Elsevier Patient Education  2021 [employer].

## 2020-07-05 NOTE — Assessment & Plan Note (Signed)
Ongoing, continue collaboration with vascular + current antiplatelet regimen.

## 2020-07-05 NOTE — Assessment & Plan Note (Signed)
Recheck A1C today, no symptoms reported. 

## 2020-07-05 NOTE — Assessment & Plan Note (Signed)
Chronic, ongoing.  Will change to Rosuvastatin due to patient concerns with Atorvastatin, educated her on this change and script sent.  She is aware to stop Atorvastatin. Lipid panel today.  Return in 6 months.

## 2020-07-05 NOTE — Progress Notes (Signed)
BP 128/78   Pulse 93   Temp 97.6 F (36.4 C) (Oral)   Wt 153 lb 6.4 oz (69.6 kg)   SpO2 97%   BMI 26.71 kg/m    Subjective:    Patient ID: Tina Hardy, female    DOB: 07-Aug-1940, 80 y.o.   MRN: [id number]  HPI: Tina Hardy is a 80 y.o. female  Chief Complaint  Patient presents with  . Hair/Scalp Problem    Pt states she would like to discuss stopping some of her current medications because she is experiencing hair loss and notices different changes.   HYPERLIPIDEMIA/HTN Continues on Atorvastatin 10 MG and Amlodipine 5 MG + ASA & Plavix. In September 2020 had CT of head which was unremarkable and MRI which showed chronic cerebellar infarct and chronic microvascular changes, with no acute stroke. Had stent placed left carotid in December 2020.Last saw vascular on 08/31/2019.  She reports concerns for hair loss with Atorvastatin -- would like to switch this.    Recent November labs showed A1C -- 5.9%, CRT 0.67, and GFR 85, LDL 70. Hyperlipidemia status:good compliance Satisfied with current treatment?yes Side effects:no Medication compliance:good compliance Past cholesterol meds:Lipitor Supplements:none Aspirin:yes The 10-year ASCVD risk score Mikey Bussing DC Jr., et al., 2013) is: 27.5%   Values used to calculate the score:     Age: 79 years     Sex: Female     Is Non-Hispanic African American: No     Diabetic: No     Tobacco smoker: No     Systolic Blood Pressure: 846 mmHg     Is BP treated: Yes     HDL Cholesterol: 41 mg/dL     Total Cholesterol: 142 mg/dL   Depression screen [facility] 2/9 07/05/2020 03/29/2020 11/04/2019 08/11/2019 08/05/2019  Decreased Interest 0 0 0 0 0  Down, Depressed, Hopeless 0 0 0 0 1  PHQ - 2 Score 0 0 0 0 1  Altered sleeping 0 3 - 0 0  Tired, decreased energy 0 0 - 0 0  Change in appetite 0 0 - 0 0  Feeling bad or failure about yourself  0 0 - 0 0  Trouble concentrating 0 0 - 0 0  Moving slowly or fidgety/restless 0 0 - 0 0   Suicidal thoughts 0 0 - 0 0  PHQ-9 Score 0 3 - 0 1  Difficult doing work/chores Not difficult at all Not difficult at all - - -   GAD 7 : Generalized Anxiety Score 07/05/2020 09/28/2019 08/11/2019 05/26/2019  Nervous, Anxious, on Edge 0 0 0 0  Control/stop worrying 0 0 0 0  Worry too much - different things 0 0 0 0  Trouble relaxing 0 0 0 0  Restless 0 0 0 0  Easily annoyed or irritable 0 0 0 0  Afraid - awful might happen 0 0 0 0  Total GAD 7 Score 0 0 0 0  Anxiety Difficulty Not difficult at all Not difficult at all - Not difficult at all     Relevant past medical, surgical, family and social history reviewed and updated as indicated. Interim medical history since our last visit reviewed. Allergies and medications reviewed and updated.  Review of Systems  Constitutional: Negative for activity change, appetite change, diaphoresis, fatigue and fever.  Respiratory: Negative for cough, chest tightness and shortness of breath.   Cardiovascular: Negative for chest pain, palpitations and leg swelling.  Gastrointestinal: Negative.   Endocrine: Negative.   Neurological: Negative.   Psychiatric/Behavioral: Negative.  Per HPI unless specifically indicated above     Objective:    BP 128/78   Pulse 93   Temp 97.6 F (36.4 C) (Oral)   Wt 153 lb 6.4 oz (69.6 kg)   SpO2 97%   BMI 26.71 kg/m   Wt Readings from Last 3 Encounters:  07/05/20 153 lb 6.4 oz (69.6 kg)  03/29/20 147 lb 6.4 oz (66.9 kg)  12/31/19 156 lb 3.2 oz (70.9 kg)    Physical Exam Vitals and nursing note reviewed.  Constitutional:      General: She is awake. She is not in acute distress.    Appearance: She is well-developed and well-groomed. She is not ill-appearing.  HENT:     Head: Normocephalic and atraumatic. Hair is normal.     Right Ear: Hearing normal.     Left Ear: Hearing normal.  Eyes:     General: Lids are normal.     Extraocular Movements: Extraocular movements intact.     Pupils: Pupils are  equal, round, and reactive to light.  Neck:     Thyroid: No thyromegaly.     Vascular: No carotid bruit or JVD.  Cardiovascular:     Rate and Rhythm: Normal rate and regular rhythm.     Heart sounds: Normal heart sounds. No murmur heard. No gallop.   Pulmonary:     Effort: Pulmonary effort is normal.     Breath sounds: Normal breath sounds.  Abdominal:     General: Bowel sounds are normal.     Palpations: Abdomen is soft. There is no hepatomegaly or splenomegaly.  Musculoskeletal:     Cervical back: Normal range of motion and neck supple.     Right lower leg: No edema.     Left lower leg: No edema.  Lymphadenopathy:     Cervical: No cervical adenopathy.  Skin:    General: Skin is warm and dry.  Neurological:     Mental Status: She is alert and oriented to person, place, and time.  Psychiatric:        Attention and Perception: Attention normal.        Mood and Affect: Mood normal.        Behavior: Behavior normal. Behavior is cooperative.        Thought Content: Thought content normal.        Judgment: Judgment normal.    Results for orders placed or performed in visit on 03/29/20  HgB A1c  Result Value Ref Range   Hgb A1c MFr Bld 5.9 (H) 4.8 - 5.6 %   Est. average glucose Bld gHb Est-mCnc 123 mg/dL  Basic metabolic panel  Result Value Ref Range   Glucose 78 65 - 99 mg/dL   BUN 11 8 - 27 mg/dL   Creatinine, Ser 0.67 0.57 - 1.00 mg/dL   GFR calc non Af Amer 85 >59 mL/min/1.73   GFR calc Af Amer 98 >59 mL/min/1.73   BUN/Creatinine Ratio 16 12 - 28   Sodium 141 134 - 144 mmol/L   Potassium 4.1 3.5 - 5.2 mmol/L   Chloride 103 96 - 106 mmol/L   CO2 23 20 - 29 mmol/L   Calcium 9.8 8.7 - 10.3 mg/dL  Lipid Panel w/o Chol/HDL Ratio  Result Value Ref Range   Cholesterol, Total 142 100 - 199 mg/dL   Triglycerides 182 (H) 0 - 149 mg/dL   HDL 41 >39 mg/dL   VLDL Cholesterol Cal 31 5 - 40 mg/dL   LDL Chol Calc (  NIH) 70 0 - 99 mg/dL  TSH  Result Value Ref Range   TSH 3.340  0.450 - 4.500 uIU/mL      Assessment & Plan:   Problem List Items Addressed This Visit      Cardiovascular and Mediastinum   Hypertension    Chronic, stable with BP at goal.  Continue current medication regimen and adjust as needed.  BMP and TSH today.  Recommend checking BP at home regularly + focus on DASH diet.  Refills sent in last visit.  Return in 6 months.      Relevant Medications   rosuvastatin (CRESTOR) 10 MG tablet   Other Relevant Orders   Basic metabolic panel   TSH   Stenosis of left carotid artery    Ongoing, continue collaboration with vascular + current antiplatelet regimen.      Relevant Medications   rosuvastatin (CRESTOR) 10 MG tablet   Aortic atherosclerosis ([place]) - Primary    Noted on past CT abdomen 08/29/2017.  Recommend she continue ASA and statin daily for prevention.      Relevant Medications   rosuvastatin (CRESTOR) 10 MG tablet     Other   Hyperlipidemia    Chronic, ongoing.  Will change to Rosuvastatin due to patient concerns with Atorvastatin, educated her on this change and script sent.  She is aware to stop Atorvastatin. Lipid panel today.  Return in 6 months.      Relevant Medications   rosuvastatin (CRESTOR) 10 MG tablet   Other Relevant Orders   Lipid Panel w/o Chol/HDL Ratio   Situational anxiety    Ongoing and chronic. Denies SI/HI.  Occasional Buspar 5 MG BID use.  Would avoid benzo use due to age and risk for chronic use.  Could trial Sertraline daily if ongoing issues or increase dose Buspar, but patient refuses at this time.  Does not wish to attend psychiatry.      Elevated hemoglobin A1c    Recheck A1C today, no symptoms reported.      Relevant Orders   HgB A1c   History of cerebellar stroke    History of old infarct.  Highly recommend she continue her statin and ASA daily.       Other Visit Diagnoses    Need for hepatitis C screening test       Hep C screening on labs today   Relevant Orders   Hepatitis C antibody        Follow up plan: Return in about 6 months (around 01/02/2021) for HTN/HLD, MOOD -- PREVENTATIVE CHECK.

## 2020-07-05 NOTE — Assessment & Plan Note (Signed)
History of old infarct.  Highly recommend she continue her statin and ASA daily. 

## 2020-07-05 NOTE — Assessment & Plan Note (Signed)
Noted on past CT abdomen 08/29/2017.  Recommend she continue ASA and statin daily for prevention. °

## 2020-07-05 NOTE — Assessment & Plan Note (Addendum)
Ongoing and chronic. Denies SI/HI.  Occasional Buspar 5 MG BID use.  Would avoid benzo use due to age and risk for chronic use.  Could trial Sertraline daily if ongoing issues or increase dose Buspar, but patient refuses at this time.  Does not wish to attend psychiatry.

## 2020-07-05 NOTE — Assessment & Plan Note (Signed)
Chronic, stable with BP at goal.  Continue current medication regimen and adjust as needed.  BMP and TSH today.  Recommend checking BP at home regularly + focus on DASH diet.  Refills sent in last visit.  Return in 6 months.

## 2020-07-06 LAB — HEMOGLOBIN A1C
Est. average glucose Bld gHb Est-mCnc: 111 mg/dL
Hgb A1c MFr Bld: 5.5 % (ref 4.8–5.6)

## 2020-07-06 LAB — LIPID PANEL W/O CHOL/HDL RATIO
Cholesterol, Total: 186 mg/dL (ref 100–199)
HDL: 45 mg/dL (ref >39)
LDL Chol Calc (NIH): 104 mg/dL — ABNORMAL HIGH (ref 0–99)
Triglycerides: 215 mg/dL — ABNORMAL HIGH (ref 0–149)
VLDL Cholesterol Cal: 37 mg/dL (ref 5–40)

## 2020-07-06 LAB — BASIC METABOLIC PANEL
BUN/Creatinine Ratio: 19 (ref 12–28)
BUN: 13 mg/dL (ref 8–27)
CO2: 22 mmol/L (ref 20–29)
Calcium: 9.6 mg/dL (ref 8.7–10.3)
Chloride: 103 mmol/L (ref 96–106)
Creatinine, Ser: 0.7 mg/dL (ref 0.57–1.00)
GFR calc Af Amer: 96 mL/min/{1.73_m2} (ref >59)
GFR calc non Af Amer: 83 mL/min/{1.73_m2} (ref >59)
Glucose: 97 mg/dL (ref 65–99)
Potassium: 4.3 mmol/L (ref 3.5–5.2)
Sodium: 140 mmol/L (ref 134–144)

## 2020-07-06 LAB — HEPATITIS C ANTIBODY: Hep C Virus Ab: <0.1 s/co ratio (ref 0.0–0.9)

## 2020-07-06 LAB — TSH: TSH: 3.46 u[IU]/mL (ref 0.450–4.500)

## 2020-07-06 NOTE — Progress Notes (Signed)
Good afternoon, please let Tina Hardy know her labs have returned and cholesterol levels are a little more elevated then previous -- please ensure you are taking the new medication, Rosuvastatin, I sent in daily.  This should not cause any issues with her hair.  Kidney function is stable.  A1c is no longer prediabetic and is in normal range.  Thyroid normal and Hep C screening is negative.  Great news!!  Have a fantastic day!! Keep being awesome!!  Thank you for allowing me to participate in your care. Kindest regards, Marleni Gallardo

## 2020-07-08 ENCOUNTER — Telehealth: Payer: Self-pay

## 2020-07-11 ENCOUNTER — Other Ambulatory Visit: Payer: Self-pay

## 2020-07-11 ENCOUNTER — Ambulatory Visit (INDEPENDENT_AMBULATORY_CARE_PROVIDER_SITE_OTHER): Payer: Medicare Other | Admitting: Nurse Practitioner

## 2020-07-11 ENCOUNTER — Encounter: Payer: Self-pay | Admitting: Nurse Practitioner

## 2020-07-11 DIAGNOSIS — F418 Other specified anxiety disorders: Secondary | ICD-10-CM

## 2020-07-11 MED ORDER — CITALOPRAM HYDROBROMIDE 10 MG PO TABS: 10.0000 mg | ORAL_TABLET | Freq: Every day | ORAL | 4 refills | Status: DC

## 2020-07-11 MED ORDER — BUSPIRONE HCL 5 MG PO TABS: 5.0000 mg | ORAL_TABLET | Freq: Two times a day (BID) | ORAL | 4 refills | Status: DC

## 2020-07-11 NOTE — Patient Instructions (Signed)
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
## 2020-07-11 NOTE — Assessment & Plan Note (Signed)
Ongoing and chronic. Denies SI/HI.  Will start Celexa 10 MG daily and continue Buspar 5 MG BID, this may offer overall benefit to anxiety.  Educated her on new medication and use.  Would avoid benzo use due to age and risk for chronic use.  Does not wish to attend psychiatry.  Return in 6 weeks.

## 2020-07-15 ENCOUNTER — Ambulatory Visit: Payer: Self-pay

## 2020-07-15 NOTE — Telephone Encounter (Signed)
Pt calling and is requesting to have a nurse give her a call back to go over medications and which ones she should be taking. Please advise.  Pt. States she is concerned "about all these medications the doctor has me on. I think I need to stop some of them." Offered to make an appointment. Declines. Would like her PCP to call her son, [name], about her medicines "and if I really need to be on all this."

## 2020-07-15 NOTE — Telephone Encounter (Signed)
Please alert her son, as we have discussed medications before: Patient does need to continue Amlodipine, ASA, Plavix, and Rosuvastatin due to her past stent placement history -- these are important to help prevent further issues and maintain stents.  The Buspirone she can stop, as we started Celexa at recent visit for her anxiety and I want her to continue this.  Claritin, Pataday eye drops, and B12 she can take as needed.  Overall she is on minimal medication, recommendation for age is 63 medications or less.  The ones she is on daily are overall important for health, especially those to prevent stroke.

## 2020-07-15 NOTE — Telephone Encounter (Signed)
Routing to provider to advise.  

## 2020-07-15 NOTE — Telephone Encounter (Signed)
Called and LVM with patient's son asking for him to please return my call.

## 2020-07-15 NOTE — Telephone Encounter (Signed)
Please alert her son, as we have discussed medications before: Patient does need to continue Amlodipine, ASA, Plavix, and Rosuvastatin due to her past stent placement history -- these are important to help prevent further issues and maintain stents.  The Buspirone she can stop, as we started Celexa at recent visit for her anxiety and I want her to continue this.  Claritin, Pataday eye drops, and B12 she can take as needed.  Overall she is on minimal medication, recommendation for age is 49 medications or less.  The ones she is on daily are overall important for health, especially those to prevent stroke.

## 2020-07-18 ENCOUNTER — Telehealth: Payer: Self-pay

## 2020-07-18 NOTE — Telephone Encounter (Signed)
Pt presented in office and discussed meds with Tiffany

## 2020-07-19 NOTE — Telephone Encounter (Signed)
erroneous error  

## 2020-07-26 ENCOUNTER — Telehealth: Payer: Self-pay | Admitting: General Practice

## 2020-07-26 ENCOUNTER — Ambulatory Visit (INDEPENDENT_AMBULATORY_CARE_PROVIDER_SITE_OTHER): Payer: Medicare HMO | Admitting: General Practice

## 2020-07-26 DIAGNOSIS — I10 Essential (primary) hypertension: Secondary | ICD-10-CM

## 2020-07-26 DIAGNOSIS — F418 Other specified anxiety disorders: Secondary | ICD-10-CM

## 2020-07-26 DIAGNOSIS — F1290 Cannabis use, unspecified, uncomplicated: Secondary | ICD-10-CM

## 2020-07-26 DIAGNOSIS — E782 Mixed hyperlipidemia: Secondary | ICD-10-CM | POA: Diagnosis not present

## 2020-07-26 NOTE — Chronic Care Management (AMB) (Signed)
Chronic Care Management   CCM RN Visit Note  07/26/2020 Name: Tina Hardy MRN: [id number] DOB: November 09, 1940  Subjective: Tina Hardy is a 80 y.o. year old female who is a primary care patient of Cannady, Barbaraann Faster, NP. The care management team was consulted for assistance with disease management and care coordination needs.    Engaged with patient by telephone for follow up visit in response to provider referral for case management and/or care coordination services.   Consent to Services:  The patient was given information about Chronic Care Management services, agreed to services, and gave verbal consent prior to initiation of services.  Please see initial visit note for detailed documentation.   Patient agreed to services and verbal consent obtained.   Assessment: Review of patient past medical history, allergies, medications, health status, including review of consultants reports, laboratory and other test data, was performed as part of comprehensive evaluation and provision of chronic care management services.   SDOH (Social Determinants of Health) assessments and interventions performed:    CCM Care Plan  Allergies  Allergen Reactions   Codeine Other (See Comments)    Unknown reaction    Penicillins Other (See Comments)    Unknown reaction  Has patient had a PCN reaction causing immediate rash, facial/tongue/throat swelling, SOB or lightheadedness with hypotension: n/a Has patient had a PCN reaction causing severe rash involving mucus membranes or skin necrosis: n/a Has patient had a PCN reaction that required hospitalization: n/a Has patient had a PCN reaction occurring within the last 10 years: n/a If all of the above answers are "NO", then may proceed with Cephalosporin use.     Outpatient Encounter Medications as of 07/26/2020  Medication Sig Note   amLODipine (NORVASC) 5 MG tablet Take 1 tablet (5 mg total) by mouth daily.    aspirin EC 81 MG EC tablet Take 1  tablet (81 mg total) by mouth daily.    busPIRone (BUSPAR) 5 MG tablet Take 1 tablet (5 mg total) by mouth 2 (two) times daily.    citalopram (CELEXA) 10 MG tablet Take 1 tablet (10 mg total) by mouth daily.    clopidogrel (PLAVIX) 75 MG tablet Take 1 tablet (75 mg total) by mouth daily at 6 (six) AM.    Cyanocobalamin (VITAMIN B-12 PO) Take 1 drop by mouth daily as needed (ENERGY SUPPORT).    loratadine (CLARITIN) 10 MG tablet Take 1 tablet (10 mg total) by mouth daily.    Multiple Vitamins-Minerals (ADULT GUMMY PO) Take 2 tablets by mouth daily.  07/29/2019: As needed    olopatadine (PATADAY) 0.1 % ophthalmic solution Place 1 drop into both eyes 2 (two) times daily.    rosuvastatin (CRESTOR) 10 MG tablet Take 1 tablet (10 mg total) by mouth daily.    No facility-administered encounter medications on file as of 07/26/2020.    Patient Active Problem List   Diagnosis Date Noted   DDD (degenerative disc disease), lumbar 06/18/2020   Aortic atherosclerosis ([place]) 06/18/2020   History of cerebellar stroke 06/18/2020   Elevated hemoglobin A1c 05/26/2019   Aortic valve sclerosis 04/24/2019   Situational anxiety 04/24/2019   Stenosis of left carotid artery 03/23/2019   Insomnia 03/23/2019   Marijuana use 03/23/2019   Hypertension 03/22/2019   Hyperlipidemia 03/22/2019   Ludwig's angina 07/04/2017   Hepatic hemangioma 07/16/2012   Diverticulosis of colon 06/22/2008    Conditions to be addressed/monitored:HTN, HLD, Anxiety and Cannibus use  Care Plan : [name]: Cannibus Substance Misuse (Adult)  Updates  made by Vanita Ingles since 07/26/2020 12:00 AM    Problem: [name]: Cannabis use/Addictive Behavior   Priority: Medium    Goal: [name]: Addictive Behavior Managed   Priority: Medium  Note:   Current Barriers:   Knowledge Deficits related to use of cannabis   Lacks caregiver support.   Film/video editor.   Unable to independently control use of cannabis   Does  not adhere to provider recommendations re: refraining from cannabis   Lacks social connections  Does not contact provider office for questions/concerns  Nurse Case Manager Clinical Goal(s):   Over the next 120 days, patient will verbalize understanding of plan for stopping the use of cannabis   Over the next 120 days, patient will attend all scheduled medical appointments: 08-23-2020 and 09-26-2020  Over the next 120 days, patient will demonstrate improved adherence to prescribed treatment plan for cessation of cannabis use as evidenced byalternative method of dealing with anxiety.  Interventions:   1:1 collaboration with Venita Lick, NP regarding development and update of comprehensive plan of care as evidenced by provider attestation and co-signature  Inter-disciplinary care team collaboration (see longitudinal plan of care)  Evaluation of current treatment plan related to anxiety and use of cannabis for relief and patient's adherence to plan as established by provider. 07-26-2020: The patient is taking celexa and buspar for anxiety. She states these have been helpful in reducing her anxiety. She does not like to talk openly about her cannabis use.   Advised patient to talk to pcp about alternatives to anxiety management and not using cannabis   Provided education to patient re: the effects of cannabis   Collaborated with pcp regarding the patient stated she has a hard time managing her anxiety unless she uses cannabis. Out right now and said that she is open to taking medication for anxiety. Education on discussing options with pcp. 07-26-2020: Saw the pcp on 07-05-2020 and 07-11-2020 for situational anxiety. The patient is on a regimen now and states she is feeling better. She is saddened about world events and was reading her Bible at the time of the call. Empathetic listening and support given.   Discussed plans with patient for ongoing care management follow up and provided patient  with direct contact information for care management team  Reviewed scheduled/upcoming provider appointments including: 08-23-2020 and 09-26-2020  Patient Goals/Self-Care Activities Over the next 120 days, patient will:  - Patient will attend all scheduled provider appointments Patient will call provider office for new concerns or questions Patient will work with [name] to address care coordination needs and will continue to work with the clinical team to address health care and disease management related needs.   - brief intervention provided - decision-making supported - family stress acknowledged - positive reinforcement provided - problem-solving facilitated - resources required to manage barriers identified - safety net resources provided - verbalization of feelings encouraged  Follow Up Plan: Telephone follow up appointment with care management team member scheduled for: 10-04-2020 at 0900 am       Care Plan : [name]: Hypertension (Adult)  Updates made by Vanita Ingles since 07/26/2020 12:00 AM    Problem: [name]: Hypertension (Hypertension)   Priority: Medium    Goal: [name]: Hypertension Monitored   Priority: Medium  Note:   Objective:   Last practice recorded BP readings:   BP Readings from Last 3 Encounters:   07/11/20  136/76   07/05/20  128/78   03/29/20  114/72  Most recent eGFR/CrCl: No results found for: EGFR  No components found for: CRCL Current Barriers:   Knowledge Deficits related to basic understanding of hypertension pathophysiology and self care management  Knowledge Deficits related to understanding of medications prescribed for management of hypertension  Transportation barriers  Limited Conservation officer, nature.   Unable to independently manage HTN and chronic conditions  Lacks social connections  Does not contact provider office for questions/concerns Case Manager Clinical Goal(s):   Over the next 120 days, patient will  verbalize understanding of plan for hypertension management  Over the next 120 days, patient will attend all scheduled medical appointments: pcp on 09-26-2020  Over the next 120 days, patient will demonstrate improved adherence to prescribed treatment plan for hypertension as evidenced by taking all medications as prescribed, monitoring and recording blood pressure as directed, adhering to low sodium/DASH diet  Over the next 120 days, patient will demonstrate improved health management independence as evidenced by checking blood pressure as directed and notifying PCP if SBP>160 or DBP > 90, taking all medications as prescribe, and adhering to a low sodium diet as discussed. Interventions:   UNABLE to independently:mange HTN  Evaluation of current treatment plan related to hypertension self management and patient's adherence to plan as established by provider.  Provided education to patient re: stroke prevention, s/s of heart attack and stroke, DASH diet, complications of uncontrolled blood pressure. 07-26-2020: Review of eating habits and the patient verbalized she is watching what she eats and doing well. Denies any new concerns.  Reviewed medications with patient and discussed importance of compliance. 07-26-2020: States compliance with medications regimen. Has not gotten biotin for hair loss yet due to cost constraints but states she will when she gets some money. It is no worse that what it was when she was in the office.   Discussed plans with patient for ongoing care management follow up and provided patient with direct contact information for care management team  Advised patient, providing education and rationale, to monitor blood pressure daily and record, calling PCP for findings outside established parameters. The patient is currently not taking blood pressures at home and does not feel like she needs to do so.   Reviewed scheduled/upcoming provider appointments including: 08-23-2020 and  09-26-2020 Patient Goals/Self-Care Activities  Over the next 120 days, patient will:  - UNABLE to independently manage HTN Attends all scheduled provider appointments Calls provider office for new concerns, questions, or BP outside discussed parameters Checks BP and records as discussed- does not check blood pressures at home Follows a low sodium diet/DASH diet - depression screen reviewed - home or ambulatory blood pressure monitoring encouraged Follow Up Plan: Telephone follow up appointment with care management team member scheduled for: 10-04-2020 at 0900 am   Care Plan : [name]: Anxiety  Updates made by Vanita Ingles since 07/26/2020 12:00 AM    Problem: [name]: Axni[place] and patient managing with Cannabis   Priority: Medium    Goal: [name]: Anxiety and Cannabis Drug Use Managed   Priority: Medium  Note:   Current Barriers:   Knowledge Deficits related to use of Cannabis for control of anxiety  Chronic Disease Management support and education needs related to anxiety  Lacks caregiver support.   Film/video editor.   Non-adherence to prescribed medication regimen  Unable to independently manage anxiety  Does not adhere to provider recommendations re: refrain from the use of cannabis to help anxiety  Does not contact provider office for questions/concerns  Nurse  Case Manager Clinical Goal(s):   Over the next 120 days, patient will verbalize understanding of plan for management of anxiety without the use of cannabis   Over the next 120 days, patient will work with [facility], CCM team and pcp to address needs related to anxiety and how to effectively manage with alternative methods other than substance abuse.   Over the next 120 days, patient will demonstrate a decrease in anxiety exacerbations as evidenced by controlled anxiety  Over the next 120 days, patient will attend all scheduled medical appointments: appointment with the pcp on 08-23-2020 and 09-26-2020  Interventions:    1:1 collaboration with Venita Lick, NP regarding development and update of comprehensive plan of care as evidenced by provider attestation and co-signature  Inter-disciplinary care team collaboration (see longitudinal plan of care)  Provided education to patient re: alternative methods for dealing with anxiety. 07-26-2020: The patient saw the pcp for situational anxiety on 07-05-2020 and 07-11-2020. She is now taking celexa and Buspar and states this is doing well for her. She will have follow up in April and knows to call the office for changes. Will continue to monitor.   Discussed plans with patient for ongoing care management follow up and provided patient with direct contact information for care management team  Provided patient with mindfullness educational materials related to helping decrease anxiety. The patient states her son is researching information from a Dr. Ardeth Perfect about how to reduce Lithium in the diet and wanted the pcp to know.   Reviewed scheduled/upcoming provider appointments including: 08-23-2020 and 09-26-2020  Patient Goals/Self-Care Activities Over the next 120 days, patient will:  - Patient will self administer medications as prescribed Patient will attend all scheduled provider appointments Patient will call pharmacy for medication refills Patient will call provider office for new concerns or questions Patient will work with [name] to address care coordination needs and will continue to work with the clinical team to address health care and disease management related needs.   - barriers to change identified - brief intervention provided - counseling provided - stage of change monitored - substance misuse monitored *Uses cannabis regularly but is out right now.    Follow Up Plan: Telephone follow up appointment with care management team member scheduled for: 10-04-2020 at 0900       Care Plan : [name]: HLD Management  Updates made by Vanita Ingles since  07/26/2020 12:00 AM    Problem: [name]: Health Promotion or Disease Self-Management (General Plan of Care)   Priority: High    Long-Range Goal: [name]: HLD Self-Management Plan Developed   Expected End Date: 09/21/2020  Note:   Current Barriers:   Poorly controlled hyperlipidemia, complicated by HTN, non-compliance and anxiety  Current antihyperlipidemic regimen: Lipitor 29m QD- changed to Rosuvastatin 10 mg daily in February of 2022  Most recent lipid panel:   Lab Results   Component  Value  Date     CHOL  186  07/05/2020     CHOL  142  03/29/2020     CHOL  158  09/28/2019     Lab Results   Component  Value  Date     HDL  45  07/05/2020     HDL  41  03/29/2020     HDL  46  09/28/2019     Lab Results   Component  Value  Date     LDLCALC  104 (H)  07/05/2020     LDLCALC  70  03/29/2020  [place]  66  09/28/2019     Lab Results   Component  Value  Date     TRIG  215 (H)  07/05/2020     TRIG  182 (H)  03/29/2020     TRIG  293 (H)  09/28/2019     Lab Results   Component  Value  Date     CHOLHDL  5.2  02/16/2019     No results found for: LDLDIRECT   ASCVD risk enhancing conditions: age >25, pre-DM, HTN, Cannabis use, former smoker  Unable to independently manage HLD  Does not adhere to provider recommendations re:   Does not contact provider office for questions/concerns  RN Care Manager Clinical Goal(s):   Over the next 120 days, patient will work with Consulting civil engineer, providers, and care team towards execution of optimized self-health management plan  Over the next 120 days, patient will verbalize understanding of plan for HLD management   Over the next 120 days, patient will attend all scheduled medical appointments: 08-23-2020 and 09-26-2020 with pcp  Over the next 120 days, patient will demonstrate improved adherence to prescribed treatment plan for HLD as evidenced by keeping  appointments, compliance with the plan of care and calling for changes   Interventions:  Medication review performed; medication list updated in electronic medical record.   Inter-disciplinary care team collaboration (see longitudinal plan of care)  Referred to pharmacy team for assistance with HLD medication management  Evaluation of current treatment plan related to HLD and patient's adherence to plan as established by provider.  Advised patient to call the provider for changes in condition or questions about health and wellness.   Provided education to patient re: benefits of following a heart healthy diet and checking blood pressures on a regular basis  Reviewed medications with patient and discussed compliance. 07-26-2020: Confirms compliance with medications regimen  Discussed plans with patient for ongoing care management follow up and provided patient with direct contact information for care management team  Reviewed scheduled/upcoming provider appointments including: 08-23-2020 and 09-26-2020  Education on active life style and benefits of staying active for health and well being. 07-26-2020: The patient states she is enjoying the warmer weather and being active. She denies any acute distress. States she is following the recommendations of the provider.   Patient Goals/Self-Care Activities:  Over the next 120 days, patient will:   - call for medicine refill 2 or 3 days before it runs out - call if I am sick and can't take my medicine - keep a list of all the medicines I take; vitamins and herbals too - learn to read medicine labels - use a pillbox to sort medicine - use an alarm clock or phone to remind me to take my medicine - drink 6 to 8 glasses of water each day - eat 3 to 5 servings of fruits and vegetables each day - eat 5 or 6 small meals each day - fill half the plate with nonstarchy vegetables - limit fast food meals to no more than 1 per week - manage portion size -  prepare main meal at home 3 to 5 days each week - read food labels for fat, fiber, carbohydrates and portion size - set a realistic goal - switch to sugar-free drinks - be open to making changes - I can manage, know and watch for signs of a heart attack - if I have chest pain, call for help - learn about small changes that will make  a big difference - learn my personal risk factors  - barriers to meeting goals identified - change-talk evoked - choices provided - collaboration with team encouraged - decision-making supported - difficulty of making life-long changes acknowledged - health risks reviewed - problem-solving facilitated - questions answered - readiness for change evaluated - reassurance provided - resources needed to meet goals identified - self-reflection promoted - self-reliance encouraged - verbalization of feelings encouraged  Follow Up Plan: Telephone follow up appointment with care management team member scheduled for: 10-04-2020 at 0900 am       Plan:Telephone follow up appointment with care management team member scheduled for:  10-04-2020 at 0900 am  [place], MSN, [facility] Mobile: [phone]

## 2020-07-26 NOTE — Patient Instructions (Signed)
Visit Information  PATIENT GOALS: Patient Care Plan: General Social Work (Adult)    Problem Identified: Anxiety Identification (Anxiety)     Goal: Anxiety Symptoms Identified   Note:   Evidence-based guidance:   Assess for presence of additional co-occurring psychiatric comorbidity [e.g., substance use, other anxiety disorder (specific phobia, social anxiety disorder, panic disorder, agoraphobia, substance or medically-induced    anxiety disorder)].   Assess for presence of medical comorbidity (e.g., chronic pain, chronic illness), recent or recurrent trauma or abuse, family history of substance use disorder or mental illness.   Screen for anxiety using standardized, validated tool.   Move gradually from investigating somatic complaints to exploring social or psychologic distress.   Assess for signs and symptoms of anxiety in an atmosphere of hope and optimism.   Notes:    Task: Identify Anxiety Symptoms and Facilitate Treatment   Note:   Care Management Activities:    - participation in parent-based training encouraged - participation in psychiatric services encouraged - somatic and anxiety symptoms correlated    Notes:    Patient Care Plan: [name]: Cannibus Substance Misuse (Adult)    Problem Identified: [name]: Cannabis use/Addictive Behavior   Priority: Medium    Goal: [name]: Addictive Behavior Managed   Priority: Medium  Note:   Current Barriers:  Marland Kitchen Knowledge Deficits related to use of cannabis  . Lacks caregiver support.  . Film/video editor.  . Unable to independently control use of cannabis  . Does not adhere to provider recommendations re: refraining from cannabis  . Lacks social connections . Does not contact provider office for questions/concerns  Nurse Case Manager Clinical Goal(s):  Marland Kitchen Over the next 120 days, patient will verbalize understanding of plan for stopping the use of cannabis  . Over the next 120 days, patient will attend all scheduled  medical appointments: 08-23-2020 and 09-26-2020 . Over the next 120 days, patient will demonstrate improved adherence to prescribed treatment plan for cessation of cannabis use as evidenced byalternative method of dealing with anxiety.  Interventions:  . 1:1 collaboration with Venita Lick, NP regarding development and update of comprehensive plan of care as evidenced by provider attestation and co-signature . Inter-disciplinary care team collaboration (see longitudinal plan of care) . Evaluation of current treatment plan related to anxiety and use of cannabis for relief and patient's adherence to plan as established by provider. 07-26-2020: The patient is taking celexa and buspar for anxiety. She states these have been helpful in reducing her anxiety. She does not like to talk openly about her cannabis use.  . Advised patient to talk to pcp about alternatives to anxiety management and not using cannabis  . Provided education to patient re: the effects of cannabis  . Collaborated with pcp regarding the patient stated she has a hard time managing her anxiety unless she uses cannabis. Out right now and said that she is open to taking medication for anxiety. Education on discussing options with pcp. 07-26-2020: Saw the pcp on 07-05-2020 and 07-11-2020 for situational anxiety. The patient is on a regimen now and states she is feeling better. She is saddened about world events and was reading her Bible at the time of the call. Empathetic listening and support given.  . Discussed plans with patient for ongoing care management follow up and provided patient with direct contact information for care management team . Reviewed scheduled/upcoming provider appointments including: 08-23-2020 and 09-26-2020  Patient Goals/Self-Care Activities Over the next 120 days, patient will:  - Patient will attend  all scheduled provider appointments Patient will call provider office for new concerns or questions Patient will work  with [name] to address care coordination needs and will continue to work with the clinical team to address health care and disease management related needs.   - brief intervention provided - decision-making supported - family stress acknowledged - positive reinforcement provided - problem-solving facilitated - resources required to manage barriers identified - safety net resources provided - verbalization of feelings encouraged  Follow Up Plan: Telephone follow up appointment with care management team member scheduled for: 10-04-2020 at 0900 am       Task: [name]: Alleviate Barriers to Substance Misuse Treatment   Note:   Care Management Activities:    - brief intervention provided - decision-making supported - family stress acknowledged - positive reinforcement provided - problem-solving facilitated - resources required to manage barriers identified - safety net resources provided - verbalization of feelings encouraged       Patient Care Plan: [name]: Hypertension (Adult)    Problem Identified: [name]: Hypertension (Hypertension)   Priority: Medium    Goal: [name]: Hypertension Monitored   Priority: Medium  Note:   Objective:  . Last practice recorded BP readings:  . BP Readings from Last 3 Encounters: .  07/11/20 . 136/76 .  07/05/20 . 128/78 .  03/29/20 . 114/72 .    Marland Kitchen Most recent eGFR/CrCl: No results found for: EGFR  No components found for: CRCL Current Barriers:  Marland Kitchen Knowledge Deficits related to basic understanding of hypertension pathophysiology and self care management . Knowledge Deficits related to understanding of medications prescribed for management of hypertension . Transportation barriers . Limited Social Support . Film/video editor.  . Unable to independently manage HTN and chronic conditions . Lacks social connections . Does not contact provider office for questions/concerns Case Manager Clinical Goal(s):  Marland Kitchen Over the next 120 days, patient will  verbalize understanding of plan for hypertension management . Over the next 120 days, patient will attend all scheduled medical appointments: pcp on 09-26-2020 . Over the next 120 days, patient will demonstrate improved adherence to prescribed treatment plan for hypertension as evidenced by taking all medications as prescribed, monitoring and recording blood pressure as directed, adhering to low sodium/DASH diet . Over the next 120 days, patient will demonstrate improved health management independence as evidenced by checking blood pressure as directed and notifying PCP if SBP>160 or DBP > 90, taking all medications as prescribe, and adhering to a low sodium diet as discussed. Interventions:  Marland Kitchen UNABLE to independently:mange HTN . Evaluation of current treatment plan related to hypertension self management and patient's adherence to plan as established by provider. . Provided education to patient re: stroke prevention, s/s of heart attack and stroke, DASH diet, complications of uncontrolled blood pressure. 07-26-2020: Review of eating habits and the patient verbalized she is watching what she eats and doing well. Denies any new concerns. . Reviewed medications with patient and discussed importance of compliance. 07-26-2020: States compliance with medications regimen. Has not gotten biotin for hair loss yet due to cost constraints but states she will when she gets some money. It is no worse that what it was when she was in the office.  . Discussed plans with patient for ongoing care management follow up and provided patient with direct contact information for care management team . Advised patient, providing education and rationale, to monitor blood pressure daily and record, calling PCP for findings outside established parameters. The patient is currently not taking blood  pressures at home and does not feel like she needs to do so.  . Reviewed scheduled/upcoming provider appointments including: 08-23-2020 and  09-26-2020 Patient Goals/Self-Care Activities . Over the next 120 days, patient will:  - UNABLE to independently manage HTN Attends all scheduled provider appointments Calls provider office for new concerns, questions, or BP outside discussed parameters Checks BP and records as discussed- does not check blood pressures at home Follows a low sodium diet/DASH diet - depression screen reviewed - home or ambulatory blood pressure monitoring encouraged Follow Up Plan: Telephone follow up appointment with care management team member scheduled for: 10-04-2020 at 0900 am   Task: [name]: Identify and Monitor Blood Pressure Elevation   Note:   Care Management Activities:    - depression screen reviewed - home or ambulatory blood pressure monitoring encouraged    Notes: Does not take blood pressures at home   Patient Care Plan: [name]: Anxiety    Problem Identified: [name]: Axniety and patient managing with Cannabis   Priority: Medium    Goal: [name]: Anxiety and Cannabis Drug Use Managed   Priority: Medium  Note:   Current Barriers:  Marland Kitchen Knowledge Deficits related to use of Cannabis for control of anxiety . Chronic Disease Management support and education needs related to anxiety . Lacks caregiver support.  . Film/video editor.  . Non-adherence to prescribed medication regimen . Unable to independently manage anxiety . Does not adhere to provider recommendations re: refrain from the use of cannabis to help anxiety . Does not contact provider office for questions/concerns  Nurse Case Manager Clinical Goal(s):  Marland Kitchen Over the next 120 days, patient will verbalize understanding of plan for management of anxiety without the use of cannabis  . Over the next 120 days, patient will work with [facility] [place] team and pcp to address needs related to anxiety and how to effectively manage with alternative methods other than substance abuse.  . Over the next 120 days, patient will demonstrate a decrease in anxiety  exacerbations as evidenced by controlled anxiety . Over the next 120 days, patient will attend all scheduled medical appointments: appointment with the pcp on 08-23-2020 and 09-26-2020  Interventions:  . 1:1 collaboration with Venita Lick, NP regarding development and update of comprehensive plan of care as evidenced by provider attestation and co-signature . Inter-disciplinary care team collaboration (see longitudinal plan of care) . Provided education to patient re: alternative methods for dealing with anxiety. 07-26-2020: The patient saw the pcp for situational anxiety on 07-05-2020 and 07-11-2020. She is now taking celexa and Buspar and states this is doing well for her. She will have follow up in April and knows to call the office for changes. Will continue to monitor.  . Discussed plans with patient for ongoing care management follow up and provided patient with direct contact information for care management team . Provided patient with mindfullness educational materials related to helping decrease anxiety. The patient states her son is researching information from a Dr. Ardeth Perfect about how to reduce Lithium in the diet and wanted the pcp to know.  . Reviewed scheduled/upcoming provider appointments including: 08-23-2020 and 09-26-2020  Patient Goals/Self-Care Activities Over the next 120 days, patient will:  - Patient will self administer medications as prescribed Patient will attend all scheduled provider appointments Patient will call pharmacy for medication refills Patient will call provider office for new concerns or questions Patient will work with [name] to address care coordination needs and will continue to work with the clinical team  to address health care and disease management related needs.   - barriers to change identified - brief intervention provided - counseling provided - stage of change monitored - substance misuse monitored *Uses cannabis regularly but is out right now.     Follow Up Plan: Telephone follow up appointment with care management team member scheduled for: 10-04-2020 at 0900       Task: [name]: Identify and Promote Change to Drug Misuse   Note:   Care Management Activities:    - barriers to change identified - brief intervention provided - counseling provided - stage of change monitored - substance misuse monitored    Notes: uses cannabis regularly but says she is out right now.     Patient Care Plan: [name]: HLD Management    Problem Identified: [name]: Health Promotion or Disease Self-Management (General Plan of Care)   Priority: High    Long-Range Goal: [name]: HLD Self-Management Plan Developed   Expected End Date: 09/21/2020  Note:   Current Barriers:  . Poorly controlled hyperlipidemia, complicated by HTN, non-compliance and anxiety . Current antihyperlipidemic regimen: Lipitor 70m QD- changed to Rosuvastatin 10 mg daily in February of 2022 . Most recent lipid panel:  . Lab Results .  Component . Value . Date .   .Marland KitchenCHOL . 186 . 07/05/2020 .   .Marland KitchenCHOL . 142 . 03/29/2020 .   .Marland KitchenCHOL . 158 . 09/28/2019 .   .Marland KitchenLab Results .  Component . Value . Date .   .Marland KitchenHDL . 45 . 07/05/2020 .   .Marland KitchenHDL . 41 . 03/29/2020 .   .Marland KitchenHDL . 46 . 09/28/2019 .   .Marland KitchenLab Results .  Component . Value . Date .   .Mar[place] KitchenL[place]. 104 (H) . 07/05/2020 .   .Marland KitchenLN[place]. 70 . 03/29/2020 .   .Mar[place] KitchenLO[place]. 66 . 09/28/2019 .   .Marland KitchenLab Results .  Component . Value . Date .   .Marland KitchenTRIG . 215 (H) . 07/05/2020 .   .Marland KitchenTRIG . 182 (H) . 03/29/2020 .   .Marland KitchenTRIG . 293 (H) . 09/28/2019 .   .Marland KitchenLab Results .  Component . Value . Date .   .Marland KitchenCHOLHDL . 5.2 . 02/16/2019 .   .Marland KitchenNo results found for: LDLDIRECT  . ASCVD risk enhancing conditions: age >>76 pre-DM, HTN, Cannabis use, former smoker . Unable to independently manage HLD . Does not adhere to provider recommendations re:  .Marland KitchenDoes not contact provider office for questions/concerns  RN Care Manager Clinical Goal(s):  .Marland KitchenOver the next 120  days, patient will work with RConsulting civil engineer providers, and care team towards execution of optimized self-health management plan . Over the next 120 days, patient will verbalize understanding of plan for HLD management  . Over the next 120 days, patient will attend all scheduled medical appointments: 08-23-2020 and 09-26-2020 with pcp . Over the next 120 days, patient will demonstrate improved adherence to prescribed treatment plan for HLD as evidenced by keeping appointments, compliance with the plan of care and calling for changes   Interventions: .Marland KitchenMedication review performed; medication list updated in electronic medical record.  .[facility] team collaboration (see longitudinal plan of care) . Referred to pharmacy team for assistance with HLD medication management . Evaluation of current treatment plan related to HLD and patient's adherence to plan as established by provider. . Advised patient to call the provider for changes in condition or  questions about health and wellness.  . Provided education to patient re: benefits of following a heart healthy diet and checking blood pressures on a regular basis . Reviewed medications with patient and discussed compliance. 07-26-2020: Confirms compliance with medications regimen . Discussed plans with patient for ongoing care management follow up and provided patient with direct contact information for care management team . Reviewed scheduled/upcoming provider appointments including: 08-23-2020 and 09-26-2020 . Education on active life style and benefits of staying active for health and well being. 07-26-2020: The patient states she is enjoying the warmer weather and being active. She denies any acute distress. States she is following the recommendations of the provider.   Patient Goals/Self-Care Activities: . Over the next 120 days, patient will:   - call for medicine refill 2 or 3 days before it runs out - call if I am sick and can't take my  medicine - keep a list of all the medicines I take; vitamins and herbals too - learn to read medicine labels - use a pillbox to sort medicine - use an alarm clock or phone to remind me to take my medicine - drink 6 to 8 glasses of water each day - eat 3 to 5 servings of fruits and vegetables each day - eat 5 or 6 small meals each day - fill half the plate with nonstarchy vegetables - limit fast food meals to no more than 1 per week - manage portion size - prepare main meal at home 3 to 5 days each week - read food labels for fat, fiber, carbohydrates and portion size - set a realistic goal - switch to sugar-free drinks - be open to making changes - I can manage, know and watch for signs of a heart attack - if I have chest pain, call for help - learn about small changes that will make a big difference - learn my personal risk factors  - barriers to meeting goals identified - change-talk evoked - choices provided - collaboration with team encouraged - decision-making supported - difficulty of making life-long changes acknowledged - health risks reviewed - problem-solving facilitated - questions answered - readiness for change evaluated - reassurance provided - resources needed to meet goals identified - self-reflection promoted - self-reliance encouraged - verbalization of feelings encouraged  Follow Up Plan: Telephone follow up appointment with care management team member scheduled for: 10-04-2020 at 0900 am     Task: [name]: HLD: Mutually Develop and Royce Macadamia Achievement of Patient Goals   Note:   Care Management Activities:    - barriers to meeting goals identified - change-talk evoked - choices provided - collaboration with team encouraged - decision-making supported - difficulty of making life-long changes acknowledged - health risks reviewed - problem-solving facilitated - questions answered - readiness for change evaluated - reassurance provided - resources  needed to meet goals identified - self-reflection promoted - self-reliance encouraged - verbalization of feelings encouraged         The patient verbalized understanding of instructions, educational materials, and care plan provided today and declined offer to receive copy of patient instructions, educational materials, and care plan.   Telephone follow up appointment with care management team member scheduled for:10-04-2020 at 0900 am  [place], MSN, [facility] Mobile: [phone]

## 2020-08-19 ENCOUNTER — Ambulatory Visit (INDEPENDENT_AMBULATORY_CARE_PROVIDER_SITE_OTHER): Payer: Medicare HMO | Admitting: Licensed Clinical Social Worker

## 2020-08-19 DIAGNOSIS — F1290 Cannabis use, unspecified, uncomplicated: Secondary | ICD-10-CM

## 2020-08-19 DIAGNOSIS — E782 Mixed hyperlipidemia: Secondary | ICD-10-CM

## 2020-08-19 DIAGNOSIS — F418 Other specified anxiety disorders: Secondary | ICD-10-CM

## 2020-08-19 DIAGNOSIS — I10 Essential (primary) hypertension: Secondary | ICD-10-CM

## 2020-08-19 NOTE — Patient Instructions (Signed)
Licensed Clinical Social Worker Visit Information  Goals we discussed today:  Goals Addressed            This Visit's Progress   . -SW Manage My Emotions       Timeframe:  Long-Range Goal Priority:  Medium Start Date:  08/19/20                           Expected End Date:   11/18/20                  Follow Up Date- 10/10/20   - begin personal counseling - call and visit an old friend - check out volunteer opportunities - join a support group - laugh; watch a funny movie or comedian - learn and use visualization or guided imagery - perform a random act of kindness - practice relaxation or meditation daily - start or continue a personal journal - talk about feelings with a friend, family or spiritual advisor - practice positive thinking and self-talk    Why is this important?    When you are stressed, down or upset, your body reacts too.   For example, your blood pressure may get higher; you may have a headache or stomachache.   When your emotions get the best of you, your body's ability to fight off cold and flu gets weak.   These steps will help you manage your emotions.    Current Barriers:  . Chronic Mental Health needs related to generalized anxiety disorder . Mental Health Concerns  . Social Isolation . Transportation Barriers-unable to get to appointments as her car no longer works. . Suicidal Ideation/Homicidal Ideation: No  Clinical Social Work Goal(s):  Marland Kitchen Over the next 120 days, patient will work with SW bi-monthly by telephone or in person to reduce or manage symptoms related to anxiety and stress  . Over the next 120 days, patient will demonstrate improved health management independence as evidenced by implementing appropriate anxiety management coping skills and self-care into her daily routine to combat future symptoms  Interventions: . Patient interviewed and appropriate assessments performed: brief mental health assessment . Provided mental health  counseling with regard to anxiety. Patient was educated on coping skills to implement when anxiety is triggered. [name] anxiety was triggered and she was recently put on new medications by PCP on 07/11/20 for this. Patient started Celexa and Buspar. Patient reports that these mediations are successfully working for her. Patient reports that she is managing her anxiety very well through her spiritual practices. Patient has upcoming PCP appointment on 08/23/20 at 10:20 am.  . Discussed plans with patient for ongoing care management follow up and provided patient with direct contact information for care management team . Advised patient to contact [facility] to schedule appointment as referral has been completed. UPDATE-Patient does not want to implement mental health support at this time. Patient shares that she is able to effectively manage her symptoms on her own at this time.  Marland Kitchen LCSW completed past referral to [facility] re: *transportation assistance needs. [name] son uses her car in order to get to and from work. Patient is now using [employer] for transportation to medical appointments. Patient confirms that she is able to gain stable transportation to upcoming PCP appointment on 08/23/20. . Assisted patient/caregiver with obtaining information about health plan benefits . Provided education and assistance to client regarding Advanced Directives. . Patient lost her spouse 3 years ago but declines needing  any grief support at this time (during the holidays.) LCSW provided grief support education in the case that she changes her mind.  . Emotional/Supportive Counseling provided during session. Patient was receptive to anxiety management education.  Marland Kitchen 1:1 collaboration with PCP regarding development and update of comprehensive plan of care as evidenced by provider attestation and co-signature   Patient Self Care Activities:  . Attends all scheduled appointments . Motivation for mental health  treatment  Patient Coping Strengths:  . Spirituality . Hopefulness . Self Advocate . Able to Communicate Effectively  Patient Self Care Deficits:  . Lacks social connections  10 LITTLE Things To Do When You're Feeling Too Down To Do Anything  Take a shower. Even if you plan to stay in all day long and not see a soul, take a shower. It takes the most effort to hop in to the shower but once you do, you'll feel immediate results. It will wake you up and you'll be feeling much fresher (and cleaner too).  Brush and floss your teeth. Give your teeth a good brushing with a floss finish. It's a small task but it feels so good and you can check 'taking care of your health' off the list of things to do.  Do something small on your list. Most of [place] have some small thing we would like to get done (load of laundry, sew a button, email a friend). Doing one of these things will make you feel like you've accomplished something.  Drink water. Drinking water is easy right? It's also really beneficial for your health so keep a glass beside you all day and take sips often. It gives you energy and prevents you from boredom eating.  Do some floor exercises. The last thing you want to do is exercise but it might be just the thing you need the most. Keep it simple and do exercises that involve sitting or laying on the floor. Even the smallest of exercises release chemicals in the brain that make you feel good. Yoga stretches or core exercises are going to make you feel good with minimal effort.  Make your bed. Making your bed takes a few minutes but it's productive and you'll feel relieved when it's done. An unmade bed is a huge visual reminder that you're having an unproductive day. Do it and consider it your housework for the day.  Put on some nice clothes. Take the sweatpants off even if you don't plan to go anywhere. Put on clothes that make you feel good. Take a look in the mirror so your brain  recognizes the sweatpants have been replaced with clothes that make you look great. It's an instant confidence booster.  Wash the dishes. A pile of dirty dishes in the sink is a reflection of your mood. It's possible that if you wash up the dishes, your mood will follow suit. It's worth a try.  Cook a real meal. If you have the luxury to have a "do nothing" day, you have time to make a real meal for yourself. Make a meal that you love to eat. The process is good to get you out of the funk and the food will ensure you have more energy for tomorrow.  Write out your thoughts by hand. When you hand write, you stimulate your brain to focus on the moment that you're in so make yourself comfortable and write whatever comes into your mind. Put those thoughts out on paper so they stop spinning around in your  head. Those thoughts might be the very thing holding you down.         Eula Fried, [name], MSW, [email or web address] Phone: [phone]

## 2020-08-19 NOTE — Chronic Care Management (AMB) (Signed)
Chronic Care Management    Clinical Social Work Note  08/19/2020 Name: Tina Hardy MRN: [id number] DOB: August 26, 1940  Tina Hardy is a 80 y.o. year old female who is a primary care patient of Tina Hardy. The CCM team was consulted to assist the patient with chronic disease management and/or care coordination needs related to: Mental Health Counseling and Resources.   Engaged with patient by telephone for follow up visit in response to provider referral for social work chronic care management and care coordination services.   Consent to Services:  The patient was given the following information about Chronic Care Management services today, agreed to services, and gave verbal consent: 1. CCM service includes personalized support from designated clinical staff supervised by the primary care provider, including individualized plan of care and coordination with other care providers 2. 24/7 contact phone numbers for assistance for urgent and routine care needs. 3. Service will only be billed when office clinical staff spend 20 minutes or more in a month to coordinate care. 4. Only one practitioner may furnish and bill the service in a calendar month. 5.The patient may stop CCM services at any time (effective at the end of the month) by phone call to the office staff. 6. The patient will be responsible for cost sharing (co-pay) of up to 20% of the service fee (after annual deductible is met). Patient agreed to services and consent obtained.  Patient agreed to services and consent obtained.   Assessment: Review of patient past medical history, allergies, medications, and health status, including review of relevant consultants reports was performed today as part of a comprehensive evaluation and provision of chronic care management and care coordination services.     SDOH (Social Determinants of Health) assessments and interventions performed:    Advanced Directives Status: See Care Plan  for related entries.  CCM Care Plan  Allergies  Allergen Reactions  . Codeine Other (See Comments)    Unknown reaction   . Penicillins Other (See Comments)    Unknown reaction  Has patient had a PCN reaction causing immediate rash, facial/tongue/throat swelling, SOB or lightheadedness with hypotension: n/a Has patient had a PCN reaction causing severe rash involving mucus membranes or skin necrosis: n/a Has patient had a PCN reaction that required hospitalization: n/a Has patient had a PCN reaction occurring within the last 10 years: n/a If all of the above answers are "NO", then may proceed with Cephalosporin use.     Outpatient Encounter Medications as of 08/19/2020  Medication Sig Note  . amLODipine (NORVASC) 5 MG tablet Take 1 tablet (5 mg total) by mouth daily.   Marland Kitchen aspirin EC 81 MG EC tablet Take 1 tablet (81 mg total) by mouth daily.   . busPIRone (BUSPAR) 5 MG tablet Take 1 tablet (5 mg total) by mouth 2 (two) times daily.   . citalopram (CELEXA) 10 MG tablet Take 1 tablet (10 mg total) by mouth daily.   . clopidogrel (PLAVIX) 75 MG tablet Take 1 tablet (75 mg total) by mouth daily at 6 (six) AM.   . Cyanocobalamin (VITAMIN B-12 PO) Take 1 drop by mouth daily as needed (ENERGY SUPPORT).   Marland Kitchen loratadine (CLARITIN) 10 MG tablet Take 1 tablet (10 mg total) by mouth daily.   . Multiple Vitamins-Minerals (ADULT GUMMY PO) Take 2 tablets by mouth daily.  07/29/2019: As needed   . olopatadine (PATADAY) 0.1 % ophthalmic solution Place 1 drop into both eyes 2 (two) times daily.   Marland Kitchen  rosuvastatin (CRESTOR) 10 MG tablet Take 1 tablet (10 mg total) by mouth daily.    No facility-administered encounter medications on file as of 08/19/2020.    Patient Active Problem List   Diagnosis Date Noted  . DDD (degenerative disc disease), lumbar 06/18/2020  . Aortic atherosclerosis ([place]) 06/18/2020  . History of cerebellar stroke 06/18/2020  . Elevated hemoglobin A1c 05/26/2019  . Aortic valve  sclerosis 04/24/2019  . Situational anxiety 04/24/2019  . Stenosis of left carotid artery 03/23/2019  . Insomnia 03/23/2019  . Marijuana use 03/23/2019  . Hypertension 03/22/2019  . Hyperlipidemia 03/22/2019  . 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 Hepatic hemangioma 07/16/2012  . Diverticulosis of colon 06/22/2008    Conditions to be addressed/monitored: Anxiety; Limited social support, ADL IADL limitations, Mental Health Concerns  and Social Isolation  Care Plan : General Social Work (Adult)  Updates made by Greg Cutter, LCSW since 08/19/2020 12:00 AM    Problem: Anxiety Identification (Anxiety)     Long-Range Goal: Anxiety Symptoms Identified   Start Date: 08/19/2020  Priority: Medium  Note:   Timeframe:  Long-Range Goal Priority:  Medium Start Date:  08/19/20                           Expected End Date:   11/18/20                  Follow Up Date- 10/10/20   - begin personal counseling - call and visit an old friend - check out volunteer opportunities - join a support group - laugh; watch a funny movie or comedian - learn and use visualization or guided imagery - perform a random act of kindness - practice relaxation or meditation daily - start or continue a personal journal - talk about feelings with a friend, family or spiritual advisor - practice positive thinking and self-talk    Why is this important?    When you are stressed, down or upset, your body reacts too.   For example, your blood pressure may get higher; you may have a headache or stomachache.   When your emotions get the best of you, your body's ability to fight off cold and flu gets weak.   These steps will help you manage your emotions.    Current Barriers:  . Chronic Mental Health needs related to generalized anxiety disorder . Mental Health Concerns  . Social Isolation . Transportation Barriers-unable to get to appointments as her car no longer works. . Suicidal Ideation/Homicidal Ideation:  No  Clinical Social Work Goal(s):  Marland Kitchen Over the next 120 days, patient will work with SW bi-monthly by telephone or in person to reduce or manage symptoms related to anxiety and stress  . Over the next 120 days, patient will demonstrate improved health management independence as evidenced by implementing appropriate anxiety management coping skills and self-care into her daily routine to combat future symptoms  Interventions: . Patient interviewed and appropriate assessments performed: brief mental health assessment . Provided mental health counseling with regard to anxiety. Patient was educated on coping skills to implement when anxiety is triggered. [name] anxiety was triggered and she was recently put on new medications by PCP on 07/11/20 for this. Patient started Celexa and Buspar. Patient reports that these mediations are successfully working for her. Patient reports that she is managing her anxiety very well through her spiritual practices. Patient has upcoming PCP appointment on 08/23/20 at 10:20 am.  . Discussed plans  with patient for ongoing care management follow up and provided patient with direct contact information for care management team . Advised patient to contact [facility] to schedule appointment as referral has been completed. UPDATE-Patient does not want to implement mental health support at this time. Patient shares that she is able to effectively manage her symptoms on her own at this time.  Marland Kitchen LCSW completed past referral to [facility] re: *transportation assistance needs. [name] son uses her car in order to get to and from work. Patient is now using [employer] for transportation to medical appointments. Patient confirms that she is able to gain stable transportation to upcoming PCP appointment on 08/23/20. . Assisted patient/caregiver with obtaining information about health plan benefits . Provided education and assistance to client regarding Advanced Directives. . Patient  lost her spouse 3 years ago but declines needing any grief support at this time (during the holidays.) LCSW provided grief support education in the case that she changes her mind.  . Emotional/Supportive Counseling provided during session. Patient was receptive to anxiety management education.  Marland Kitchen 1:1 collaboration with PCP regarding development and update of comprehensive plan of care as evidenced by provider attestation and co-signature   Patient Self Care Activities:  . Attends all scheduled appointments . Motivation for mental health treatment  Patient Coping Strengths:  . Spirituality . Hopefulness . Self Advocate . Able to Communicate Effectively  Patient Self Care Deficits:  . Lacks social connections  10 LITTLE Things To Do When You're Feeling Too Down To Do Anything  Take a shower. Even if you plan to stay in all day long and not see a soul, take a shower. It takes the most effort to hop in to the shower but once you do, you'll feel immediate results. It will wake you up and you'll be feeling much fresher (and cleaner too).  Brush and floss your teeth. Give your teeth a good brushing with a floss finish. It's a small task but it feels so good and you can check 'taking care of your health' off the list of things to do.  Do something small on your list. Most of [place] have some small thing we would like to get done (load of laundry, sew a button, email a friend). Doing one of these things will make you feel like you've accomplished something.  Drink water. Drinking water is easy right? It's also really beneficial for your health so keep a glass beside you all day and take sips often. It gives you energy and prevents you from boredom eating.  Do some floor exercises. The last thing you want to do is exercise but it might be just the thing you need the most. Keep it simple and do exercises that involve sitting or laying on the floor. Even the smallest of exercises release chemicals in  the brain that make you feel good. Yoga stretches or core exercises are going to make you feel good with minimal effort.  Make your bed. Making your bed takes a few minutes but it's productive and you'll feel relieved when it's done. An unmade bed is a huge visual reminder that you're having an unproductive day. Do it and consider it your housework for the day.  Put on some nice clothes. Take the sweatpants off even if you don't plan to go anywhere. Put on clothes that make you feel good. Take a look in the mirror so your brain recognizes the sweatpants have been replaced with clothes that make you  look great. It's an instant confidence booster.  Wash the dishes. A pile of dirty dishes in the sink is a reflection of your mood. It's possible that if you wash up the dishes, your mood will follow suit. It's worth a try.  Cook a real meal. If you have the luxury to have a "do nothing" day, you have time to make a real meal for yourself. Make a meal that you love to eat. The process is good to get you out of the funk and the food will ensure you have more energy for tomorrow.  Write out your thoughts by hand. When you hand write, you stimulate your brain to focus on the moment that you're in so make yourself comfortable and write whatever comes into your mind. Put those thoughts out on paper so they stop spinning around in your head. Those thoughts might be the very thing holding you down.       Follow Up Plan: SW will follow up with patient on 10/10/20      Eula Fried, [name], MSW, [email or web address] Phone: [phone]

## 2020-08-23 ENCOUNTER — Encounter: Payer: Self-pay | Admitting: Nurse Practitioner

## 2020-08-23 ENCOUNTER — Ambulatory Visit (INDEPENDENT_AMBULATORY_CARE_PROVIDER_SITE_OTHER): Payer: Medicare HMO | Admitting: Nurse Practitioner

## 2020-08-23 ENCOUNTER — Other Ambulatory Visit: Payer: Self-pay

## 2020-08-23 DIAGNOSIS — F1290 Cannabis use, unspecified, uncomplicated: Secondary | ICD-10-CM | POA: Diagnosis not present

## 2020-08-23 DIAGNOSIS — F418 Other specified anxiety disorders: Secondary | ICD-10-CM | POA: Diagnosis not present

## 2020-08-23 DIAGNOSIS — R69 Illness, unspecified: Secondary | ICD-10-CM | POA: Diagnosis not present

## 2020-08-23 NOTE — Assessment & Plan Note (Signed)
Reports occasional use.  Recommend cutting back on this.  Continue to recommend no use.
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
## 2020-08-23 NOTE — Assessment & Plan Note (Signed)
Ongoing and chronic. Denies SI/HI.  Will continue Celexa 10 MG daily, this is offering benefit to anxiety.  Would avoid benzo use due to age and risk for chronic use.  Does not wish to attend psychiatry.  Return in 6 months.

## 2020-08-23 NOTE — Patient Instructions (Signed)

## 2020-09-26 ENCOUNTER — Ambulatory Visit: Payer: Medicare Other | Admitting: Nurse Practitioner

## 2020-10-04 ENCOUNTER — Ambulatory Visit (INDEPENDENT_AMBULATORY_CARE_PROVIDER_SITE_OTHER): Payer: Medicare HMO | Admitting: General Practice

## 2020-10-04 ENCOUNTER — Telehealth: Payer: Self-pay | Admitting: General Practice

## 2020-10-04 DIAGNOSIS — E782 Mixed hyperlipidemia: Secondary | ICD-10-CM

## 2020-10-04 DIAGNOSIS — I10 Essential (primary) hypertension: Secondary | ICD-10-CM | POA: Diagnosis not present

## 2020-10-04 DIAGNOSIS — F418 Other specified anxiety disorders: Secondary | ICD-10-CM

## 2020-10-04 DIAGNOSIS — F1290 Cannabis use, unspecified, uncomplicated: Secondary | ICD-10-CM

## 2020-10-04 NOTE — Chronic Care Management (AMB) (Signed)
Chronic Care Management   CCM RN Visit Note  10/04/2020 Name: Tina Hardy MRN: [id number] DOB: 1941-03-14  Subjective: Tina Hardy is a 80 y.o. year old female who is a primary care patient of Cannady, Barbaraann Faster, NP. The care management team was consulted for assistance with disease management and care coordination needs.    Engaged with patient by telephone for follow up visit in response to provider referral for case management and/or care coordination services.   Consent to Services:  The patient was given information about Chronic Care Management services, agreed to services, and gave verbal consent prior to initiation of services.  Please see initial visit note for detailed documentation.   Patient agreed to services and verbal consent obtained.   Assessment: Review of patient past medical history, allergies, medications, health status, including review of consultants reports, laboratory and other test data, was performed as part of comprehensive evaluation and provision of chronic care management services.   SDOH (Social Determinants of Health) assessments and interventions performed:    CCM Care Plan  Allergies  Allergen Reactions  . Codeine Other (See Comments)    Unknown reaction   . Penicillins Other (See Comments)    Unknown reaction  Has patient had a PCN reaction causing immediate rash, facial/tongue/throat swelling, SOB or lightheadedness with hypotension: n/a Has patient had a PCN reaction causing severe rash involving mucus membranes or skin necrosis: n/a Has patient had a PCN reaction that required hospitalization: n/a Has patient had a PCN reaction occurring within the last 10 years: n/a If all of the above answers are "NO", then may proceed with Cephalosporin use.     Outpatient Encounter Medications as of 10/04/2020  Medication Sig  . amLODipine (NORVASC) 5 MG tablet Take 1 tablet (5 mg total) by mouth daily.  Marland Kitchen aspirin EC 81 MG EC tablet Take 1  tablet (81 mg total) by mouth daily.  . citalopram (CELEXA) 10 MG tablet Take 1 tablet (10 mg total) by mouth daily.  . clopidogrel (PLAVIX) 75 MG tablet Take 1 tablet (75 mg total) by mouth daily at 6 (six) AM.  . rosuvastatin (CRESTOR) 10 MG tablet Take 1 tablet (10 mg total) by mouth daily.   No facility-administered encounter medications on file as of 10/04/2020.    Patient Active Problem List   Diagnosis Date Noted  . DDD (degenerative disc disease), lumbar 06/18/2020  . Aortic atherosclerosis ([place]) 06/18/2020  . History of cerebellar stroke 06/18/2020  . Elevated hemoglobin A1c 05/26/2019  . Aortic valve sclerosis 04/24/2019  . Situational anxiety 04/24/2019  . Stenosis of left carotid artery 03/23/2019  . Insomnia 03/23/2019  . Marijuana use 03/23/2019  . Hypertension 03/22/2019  . Hyperlipidemia 03/22/2019  . 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 Hepatic hemangioma 07/16/2012  . Diverticulosis of colon 06/22/2008    Conditions to be addressed/monitored:HTN, HLD, Anxiety and Cannibal use  Care Plan : [name]: Cannibus Substance Misuse (Adult)  Updates made by Vanita Ingles since 10/04/2020 12:00 AM    Problem: [name]: Cannabis use/Addictive Behavior   Priority: Medium    Goal: [name]: Addictive Behavior Managed   Priority: Medium  Note:   Current Barriers:  Marland Kitchen Knowledge Deficits related to use of cannabis  . Lacks caregiver support.  . Film/video editor.  . Unable to independently control use of cannabis  . Does not adhere to provider recommendations re: refraining from cannabis  . Lacks social connections . Does not contact provider office for questions/concerns  Nurse Case Manager Clinical Goal(s):  .  Over the next 120 days, patient will verbalize understanding of plan for stopping the use of cannabis  . Over the next 120 days, patient will attend all scheduled medical appointments: 01-02-2021 . Over the next 120 days, patient will demonstrate improved adherence to  prescribed treatment plan for cessation of cannabis use as evidenced byalternative method of dealing with anxiety.  Interventions:  . 1:1 collaboration with Venita Lick, NP regarding development and update of comprehensive plan of care as evidenced by provider attestation and co-signature . Inter-disciplinary care team collaboration (see longitudinal plan of care) . Evaluation of current treatment plan related to anxiety and use of cannabis for relief and patient's adherence to plan as established by provider.10-04-2020: The patient is taking celexa and buspar for anxiety. She states these have been helpful in reducing her anxiety. She does not like to talk openly about her cannabis use. The patient states that she uses cannabis every now and then. She says that helps her because she does not like to take a pill. The patient feels her anxiety is controlled at this time.  . Advised patient to talk to pcp about alternatives to anxiety management and not using cannabis  . Provided education to patient re: the effects of cannabis  . Collaborated with pcp regarding the patient stated she has a hard time managing her anxiety unless she uses cannabis. Out right now and said that she is open to taking medication for anxiety. Education on discussing options with pcp. 07-26-2020: Saw the pcp on 07-05-2020 and 07-11-2020 for situational anxiety. The patient is on a regimen now and states she is feeling better. She is saddened about world events and was reading her Bible at the time of the call. Empathetic listening and support given. 10-04-2020: The patient saw the pcp on 08-23-2020. The patient is stable at this time. Will continue to monitor and follow up. Next pcp visit 01-02-2021. Marland Kitchen Discussed plans with patient for ongoing care management follow up and provided patient with direct contact information for care management team . Care guide referral for help with resources in [facility] for a dentist that is  covered by her insurance . Assisted the patient in insurance questions, provided coverage details from the chart and provided numbers for the patient to call to obtain a new insurance card. The patient states during her "panic attack", she misplaced her cards and other important documents. Empathetic listening and support.  . Reviewed scheduled/upcoming provider appointments including: 01-02-2021  Patient Goals/Self-Care Activities Over the next 120 days, patient will:  - Patient will attend all scheduled provider appointments Patient will call provider office for new concerns or questions Patient will work with [name] to address care coordination needs and will continue to work with the clinical team to address health care and disease management related needs.   - brief intervention provided - decision-making supported - family stress acknowledged - positive reinforcement provided - problem-solving facilitated - resources required to manage barriers identified - safety net resources provided - verbalization of feelings encouraged  Follow Up Plan: Telephone follow up appointment with care management team member scheduled for: 12-06-2020 at 0900 am       Care Plan : [name]: Hypertension (Adult)  Updates made by Vanita Ingles since 10/04/2020 12:00 AM    Problem: [name]: Hypertension (Hypertension)   Priority: Medium    Long-Range Goal: [name]: Hypertension Monitored   Priority: Medium  Note:   Objective:  . Last practice recorded BP readings:  . BP  Readings from Last 3 Encounters: .  08/23/20 . 117/73 .  07/11/20 . 136/76 .  07/05/20 . 128/78 .     Marland Kitchen Most recent eGFR/CrCl: No results found for: EGFR  No components found for: CRCL Current Barriers:  Marland Kitchen Knowledge Deficits related to basic understanding of hypertension pathophysiology and self care management . Knowledge Deficits related to understanding of medications prescribed for management of hypertension . Transportation  barriers . Limited Social Support . Film/video editor.  . Unable to independently manage HTN and chronic conditions . Lacks social connections . Does not contact provider office for questions/concerns Case Manager Clinical Goal(s):  Marland Kitchen Over the next 120 days, patient will verbalize understanding of plan for hypertension management . Over the next 120 days, patient will attend all scheduled medical appointments: pcp on 01-02-2021 . Over the next 120 days, patient will demonstrate improved adherence to prescribed treatment plan for hypertension as evidenced by taking all medications as prescribed, monitoring and recording blood pressure as directed, adhering to low sodium/DASH diet . Over the next 120 days, patient will demonstrate improved health management independence as evidenced by checking blood pressure as directed and notifying PCP if SBP>150 or DBP > 90, taking all medications as prescribe, and adhering to a low sodium diet as discussed. Interventions:  Marland Kitchen UNABLE to independently:mange HTN . Evaluation of current treatment plan related to hypertension self management and patient's adherence to plan as established by provider. 10-04-2020: The patient has good control of her HTN at this time. States she is eating okay. Denies any issues with her blood pressure.  . Provided education to patient re: stroke prevention, s/s of heart attack and stroke, DASH diet, complications of uncontrolled blood pressure. 10-04-2020: Review of eating habits and the patient verbalized she is watching what she eats and doing well. Denies any new concerns. . Reviewed medications with patient and discussed importance of compliance. 07-26-2020: States compliance with medications regimen. Has not gotten biotin for hair loss yet due to cost constraints but states she will when she gets some money. It is no worse that what it was when she was in the office. 10-04-2020: States compliance with her medications at this time.   . Discussed plans with patient for ongoing care management follow up and provided patient with direct contact information for care management team . Advised patient, providing education and rationale, to monitor blood pressure daily and record, calling PCP for findings outside established parameters. The patient is currently not taking blood pressures at home and does not feel like she needs to do so.  . Reviewed scheduled/upcoming provider appointments including: 01-02-2021 Patient Goals/Self-Care Activities . Over the next 120 days, patient will:  - UNABLE to independently manage HTN Attends all scheduled provider appointments Calls provider office for new concerns, questions, or BP outside discussed parameters Checks BP and records as discussed- does not check blood pressures at home Follows a low sodium diet/DASH diet - depression screen reviewed - home or ambulatory blood pressure monitoring encouraged Follow Up Plan: Telephone follow up appointment with care management team member scheduled for: 12-06-2020 at 0900 am   Care Plan : [name]: Anxiety  Updates made by Vanita Ingles since 10/04/2020 12:00 AM    Problem: [name]: Axniety and patient managing with Cannabis   Priority: Medium    Long-Range Goal: [name]: Anxiety and Cannabis Drug Use Managed   Priority: Medium  Note:   Current Barriers:  Marland Kitchen Knowledge Deficits related to use of Cannabis for control of anxiety .  Chronic Disease Management support and education needs related to anxiety . Lacks caregiver support.  . Film/video editor.  . Non-adherence to prescribed medication regimen . Unable to independently manage anxiety . Does not adhere to provider recommendations re: refrain from the use of cannabis to help anxiety . Does not contact provider office for questions/concerns  Nurse Case Manager Clinical Goal(s):  Marland Kitchen Over the next 120 days, patient will verbalize understanding of plan for management of anxiety without the use  of cannabis  . Over the next 120 days, patient will work with [facility] team and pcp to address needs related to anxiety and how to effectively manage with alternative methods other than substance abuse.  . Over the next 120 days, patient will demonstrate a decrease in anxiety exacerbations as evidenced by controlled anxiety . Over the next 120 days, patient will attend all scheduled medical appointments: appointment with the pcp on 01-02-2021 Interventions:  . 1:1 collaboration with Venita Lick, NP regarding development and update of comprehensive plan of care as evidenced by provider attestation and co-signature . Inter-disciplinary care team collaboration (see longitudinal plan of care) . Provided education to patient re: alternative methods for dealing with anxiety. 07-26-2020: The patient saw the pcp for situational anxiety on 07-05-2020 and 07-11-2020. She is now taking celexa and Buspar and states this is doing well for her. 10-04-2020: The patient states she is doing well with managing her anxiety at this time. Did have a panic attack recently and misplaced her insurance cards and ID. Was a little stressed about this but had numbers to call and information provided to her by [facility] to assist with calling her insurance companies to get new insurance cards. Care Guide referral also placed for help with finding a dentist in [facility].  . Discussed plans with patient for ongoing care management follow up and provided patient with direct contact information for care management team . Provided patient with mindfullness educational materials related to helping decrease anxiety. The patient states her son is researching information from a Dr. Ardeth Perfect about how to reduce Lithium in the diet and wanted the pcp to know.  . Reviewed scheduled/upcoming provider appointments including: 01-02-2021  Patient Goals/Self-Care Activities Over the next 120 days, patient will:  - Patient will self administer  medications as prescribed Patient will attend all scheduled provider appointments Patient will call pharmacy for medication refills Patient will call provider office for new concerns or questions Patient will work with [name] to address care coordination needs and will continue to work with the clinical team to address health care and disease management related needs.   - barriers to change identified - brief intervention provided - counseling provided - stage of change monitored - substance misuse monitored *Uses cannabis regularly but is out right now.    Follow Up Plan: Telephone follow up appointment with care management team member scheduled for: 12-06-2020 at 0900       Care Plan : [name]: HLD Management  Updates made by Vanita Ingles since 10/04/2020 12:00 AM    Problem: [name]: Health Promotion or Disease Self-Management (General Plan of Care)   Priority: High    Long-Range Goal: [name]: HLD Self-Management Plan Developed   Expected End Date: 09/21/2020  Priority: Medium  Note:   Current Barriers:  . Poorly controlled hyperlipidemia, complicated by HTN, non-compliance and anxiety . Current antihyperlipidemic regimen: Lipitor 57m QD- changed to Rosuvastatin 10 mg daily in February of 2022 . Most recent lipid panel:  .  Lab Results .  Component . Value . Date .   Marland Kitchen CHOL . 186 . 07/05/2020 .   Marland Kitchen CHOL . 142 . 03/29/2020 .   Marland Kitchen CHOL . 158 . 09/28/2019 .   Marland Kitchen Lab Results .  Component . Value . Date .   Marland Kitchen HDL . 45 . 07/05/2020 .   Marland Kitchen HDL . 41 . 03/29/2020 .   Marland Kitchen HDL . 46 . 09/28/2019 .   Marland Kitchen Lab Results .  Component . Value . Date .   Marland Kitchen [place] . 104 (H) . 07/05/2020 .   Marland Kitchen [place] . 70 . 03/29/2020 .   Marland Kitchen [place] . 66 . 09/28/2019 .   Marland Kitchen Lab Results .  Component . Value . Date .   Marland Kitchen TRIG . 215 (H) . 07/05/2020 .   Marland Kitchen TRIG . 182 (H) . 03/29/2020 .   Marland Kitchen TRIG . 293 (H) . 09/28/2019 .   Marland Kitchen Lab Results .  Component . Value . Date .   Marland Kitchen CHOLHDL . 5.2 . 02/16/2019 .   Marland Kitchen No results found  for: LDLDIRECT  . ASCVD risk enhancing conditions: age >60, pre-DM, HTN, Cannabis use, former smoker . Unable to independently manage HLD . Does not adhere to provider recommendations re: medications at times and heart healthy diet  . Does not contact provider office for questions/concerns  RN Care Manager Clinical Goal(s):  Marland Kitchen Over the next 120 days, patient will work with Consulting civil engineer, providers, and care team towards execution of optimized self-health management plan . Over the next 120 days, patient will verbalize understanding of plan for HLD management  . Over the next 120 days, patient will attend all scheduled medical appointments: 01-02-2021 with pcp . Over the next 120 days, patient will demonstrate improved adherence to prescribed treatment plan for HLD as evidenced by keeping appointments, compliance with the plan of care and calling for changes   Interventions: Marland Kitchen Medication review performed; medication list updated in electronic medical record.  Bertram Savin care team collaboration (see longitudinal plan of care) . Referred to pharmacy team for assistance with HLD medication management . Evaluation of current treatment plan related to HLD and patient's adherence to plan as established by provider. . Advised patient to call the provider for changes in condition or questions about health and wellness.  . Provided education to patient re: benefits of following a heart healthy diet and checking blood pressures on a regular basis . Reviewed medications with patient and discussed compliance. 10-04-2020: Confirms compliance with medications regimen . Discussed plans with patient for ongoing care management follow up and provided patient with direct contact information for care management team . Reviewed scheduled/upcoming provider appointments including: 01-02-2021 . Education on active life style and benefits of staying active for health and well being. 10-04-2020: The patient  states she is enjoying the warmer weather and being active. She denies any acute distress. States she is following the recommendations of the provider.   Patient Goals/Self-Care Activities: . Over the next 120 days, patient will:   - call for medicine refill 2 or 3 days before it runs out - call if I am sick and can't take my medicine - keep a list of all the medicines I take; vitamins and herbals too - learn to read medicine labels - use a pillbox to sort medicine - use an alarm clock or phone to remind me to take my medicine - drink 6 to 8 glasses of water each  day - eat 3 to 5 servings of fruits and vegetables each day - eat 5 or 6 small meals each day - fill half the plate with nonstarchy vegetables - limit fast food meals to no more than 1 per week - manage portion size - prepare main meal at home 3 to 5 days each week - read food labels for fat, fiber, carbohydrates and portion size - set a realistic goal - switch to sugar-free drinks - be open to making changes - I can manage, know and watch for signs of a heart attack - if I have chest pain, call for help - learn about small changes that will make a big difference - learn my personal risk factors  - barriers to meeting goals identified - change-talk evoked - choices provided - collaboration with team encouraged - decision-making supported - difficulty of making life-long changes acknowledged - health risks reviewed - problem-solving facilitated - questions answered - readiness for change evaluated - reassurance provided - resources needed to meet goals identified - self-reflection promoted - self-reliance encouraged - verbalization of feelings encouraged  Follow Up Plan: Telephone follow up appointment with care management team member scheduled for: 12-06-2020 at 0900 am       Plan:Telephone follow up appointment with care management team member scheduled for:  12-06-2020 at 0900 am  Noreene Larsson RN, MSN,  [facility] Mobile: [phone]

## 2020-10-04 NOTE — Patient Instructions (Signed)
Visit Information  PATIENT GOALS: Patient Care Plan: General Social Work (Adult)    Problem Identified: Anxiety Identification (Anxiety)     Long-Range Goal: Anxiety Symptoms Identified   Start Date: 08/19/2020  Priority: Medium  Note:   Timeframe:  Long-Range Goal Priority:  Medium Start Date:  08/19/20                           Expected End Date:   11/18/20                  Follow Up Date- 10/10/20   - begin personal counseling - call and visit an old friend - check out volunteer opportunities - join a support group - laugh; watch a funny movie or comedian - learn and use visualization or guided imagery - perform a random act of kindness - practice relaxation or meditation daily - start or continue a personal journal - talk about feelings with a friend, family or spiritual advisor - practice positive thinking and self-talk    Why is this important?    When you are stressed, down or upset, your body reacts too.   For example, your blood pressure may get higher; you may have a headache or stomachache.   When your emotions get the best of you, your body's ability to fight off cold and flu gets weak.   These steps will help you manage your emotions.    Current Barriers:  . Chronic Mental Health needs related to generalized anxiety disorder . Mental Health Concerns  . Social Isolation . Transportation Barriers-unable to get to appointments as her car no longer works. . Suicidal Ideation/Homicidal Ideation: No  Clinical Social Work Goal(s):  Marland Kitchen Over the next 120 days, patient will work with SW bi-monthly by telephone or in person to reduce or manage symptoms related to anxiety and stress  . Over the next 120 days, patient will demonstrate improved health management independence as evidenced by implementing appropriate anxiety management coping skills and self-care into her daily routine to combat future symptoms  Interventions: . Patient interviewed and appropriate assessments  performed: brief mental health assessment . Provided mental health counseling with regard to anxiety. Patient was educated on coping skills to implement when anxiety is triggered. [name] anxiety was triggered and she was recently put on new medications by PCP on 07/11/20 for this. Patient started Celexa and Buspar. Patient reports that these mediations are successfully working for her. Patient reports that she is managing her anxiety very well through her spiritual practices. Patient has upcoming PCP appointment on 08/23/20 at 10:20 am.  . Discussed plans with patient for ongoing care management follow up and provided patient with direct contact information for care management team . Advised patient to contact [facility] to schedule appointment as referral has been completed. UPDATE-Patient does not want to implement mental health support at this time. Patient shares that she is able to effectively manage her symptoms on her own at this time.  Marland Kitchen LCSW completed past referral to [facility] re: *transportation assistance needs. [name] son uses her car in order to get to and from work. Patient is now using [employer] for transportation to medical appointments. Patient confirms that she is able to gain stable transportation to upcoming PCP appointment on 08/23/20. . Assisted patient/caregiver with obtaining information about health plan benefits . Provided education and assistance to client regarding Advanced Directives. . Patient lost her spouse 3 years ago but declines needing  any grief support at this time (during the holidays.) LCSW provided grief support education in the case that she changes her mind.  . Emotional/Supportive Counseling provided during session. Patient was receptive to anxiety management education.  Marland Kitchen 1:1 collaboration with PCP regarding development and update of comprehensive plan of care as evidenced by provider attestation and co-signature   Patient Self Care Activities:   . Attends all scheduled appointments . Motivation for mental health treatment  Patient Coping Strengths:  . Spirituality . Hopefulness . Self Advocate . Able to Communicate Effectively  Patient Self Care Deficits:  . Lacks social connections  10 LITTLE Things To Do When You're Feeling Too Down To Do Anything  Take a shower. Even if you plan to stay in all day long and not see a soul, take a shower. It takes the most effort to hop in to the shower but once you do, you'll feel immediate results. It will wake you up and you'll be feeling much fresher (and cleaner too).  Brush and floss your teeth. Give your teeth a good brushing with a floss finish. It's a small task but it feels so good and you can check 'taking care of your health' off the list of things to do.  Do something small on your list. Most of [place] have some small thing we would like to get done (load of laundry, sew a button, email a friend). Doing one of these things will make you feel like you've accomplished something.  Drink water. Drinking water is easy right? It's also really beneficial for your health so keep a glass beside you all day and take sips often. It gives you energy and prevents you from boredom eating.  Do some floor exercises. The last thing you want to do is exercise but it might be just the thing you need the most. Keep it simple and do exercises that involve sitting or laying on the floor. Even the smallest of exercises release chemicals in the brain that make you feel good. Yoga stretches or core exercises are going to make you feel good with minimal effort.  Make your bed. Making your bed takes a few minutes but it's productive and you'll feel relieved when it's done. An unmade bed is a huge visual reminder that you're having an unproductive day. Do it and consider it your housework for the day.  Put on some nice clothes. Take the sweatpants off even if you don't plan to go anywhere. Put on clothes  that make you feel good. Take a look in the mirror so your brain recognizes the sweatpants have been replaced with clothes that make you look great. It's an instant confidence booster.  Wash the dishes. A pile of dirty dishes in the sink is a reflection of your mood. It's possible that if you wash up the dishes, your mood will follow suit. It's worth a try.  Cook a real meal. If you have the luxury to have a "do nothing" day, you have time to make a real meal for yourself. Make a meal that you love to eat. The process is good to get you out of the funk and the food will ensure you have more energy for tomorrow.  Write out your thoughts by hand. When you hand write, you stimulate your brain to focus on the moment that you're in so make yourself comfortable and write whatever comes into your mind. Put those thoughts out on paper so they stop spinning around in your  head. Those thoughts might be the very thing holding you down.    Task: Identify Anxiety Symptoms and Facilitate Treatment   Note:   Care Management Activities:    - participation in parent-based training encouraged - participation in psychiatric services encouraged - somatic and anxiety symptoms correlated    Notes:    Patient Care Plan: [name]: Cannibus Substance Misuse (Adult)    Problem Identified: [name]: Cannabis use/Addictive Behavior   Priority: Medium    Goal: [name]: Addictive Behavior Managed   Priority: Medium  Note:   Current Barriers:  Marland Kitchen Knowledge Deficits related to use of cannabis  . Lacks caregiver support.  . Film/video editor.  . Unable to independently control use of cannabis  . Does not adhere to provider recommendations re: refraining from cannabis  . Lacks social connections . Does not contact provider office for questions/concerns  Nurse Case Manager Clinical Goal(s):  Marland Kitchen Over the next 120 days, patient will verbalize understanding of plan for stopping the use of cannabis  . Over the next 120  days, patient will attend all scheduled medical appointments: 01-02-2021 . Over the next 120 days, patient will demonstrate improved adherence to prescribed treatment plan for cessation of cannabis use as evidenced byalternative method of dealing with anxiety.  Interventions:  . 1:1 collaboration with Venita Lick, NP regarding development and update of comprehensive plan of care as evidenced by provider attestation and co-signature . Inter-disciplinary care team collaboration (see longitudinal plan of care) . Evaluation of current treatment plan related to anxiety and use of cannabis for relief and patient's adherence to plan as established by provider.10-04-2020: The patient is taking celexa and buspar for anxiety. She states these have been helpful in reducing her anxiety. She does not like to talk openly about her cannabis use. The patient states that she uses cannabis every now and then. She says that helps her because she does not like to take a pill. The patient feels her anxiety is controlled at this time.  . Advised patient to talk to pcp about alternatives to anxiety management and not using cannabis  . Provided education to patient re: the effects of cannabis  . Collaborated with pcp regarding the patient stated she has a hard time managing her anxiety unless she uses cannabis. Out right now and said that she is open to taking medication for anxiety. Education on discussing options with pcp. 07-26-2020: Saw the pcp on 07-05-2020 and 07-11-2020 for situational anxiety. The patient is on a regimen now and states she is feeling better. She is saddened about world events and was reading her Bible at the time of the call. Empathetic listening and support given. 10-04-2020: The patient saw the pcp on 08-23-2020. The patient is stable at this time. Will continue to monitor and follow up. Next pcp visit 01-02-2021. Marland Kitchen Discussed plans with patient for ongoing care management follow up and provided patient with  direct contact information for care management team . Care guide referral for help with resources in [facility] for a dentist that is covered by her insurance . Assisted the patient in insurance questions, provided coverage details from the chart and provided numbers for the patient to call to obtain a new insurance card. The patient states during her "panic attack", she misplaced her cards and other important documents. Empathetic listening and support.  . Reviewed scheduled/upcoming provider appointments including: 01-02-2021  Patient Goals/Self-Care Activities Over the next 120 days, patient will:  - Patient will attend all scheduled provider  appointments Patient will call provider office for new concerns or questions Patient will work with [name] to address care coordination needs and will continue to work with the clinical team to address health care and disease management related needs.   - brief intervention provided - decision-making supported - family stress acknowledged - positive reinforcement provided - problem-solving facilitated - resources required to manage barriers identified - safety net resources provided - verbalization of feelings encouraged  Follow Up Plan: Telephone follow up appointment with care management team member scheduled for: 12-06-2020 at 0900 am       Task: [name]: Alleviate Barriers to Substance Misuse Treatment   Note:   Care Management Activities:    - brief intervention provided - decision-making supported - family stress acknowledged - positive reinforcement provided - problem-solving facilitated - resources required to manage barriers identified - safety net resources provided - verbalization of feelings encouraged       Patient Care Plan: [name]: Hypertension (Adult)    Problem Identified: [name]: Hypertension (Hypertension)   Priority: Medium    Long-Range Goal: [name]: Hypertension Monitored   Priority: Medium  Note:   Objective:   . Last practice recorded BP readings:  . BP Readings from Last 3 Encounters: .  08/23/20 . 117/73 .  07/11/20 . 136/76 .  07/05/20 . 128/78 .     Marland Kitchen Most recent eGFR/CrCl: No results found for: EGFR  No components found for: CRCL Current Barriers:  Marland Kitchen Knowledge Deficits related to basic understanding of hypertension pathophysiology and self care management . Knowledge Deficits related to understanding of medications prescribed for management of hypertension . Transportation barriers . Limited Social Support . Film/video editor.  . Unable to independently manage HTN and chronic conditions . Lacks social connections . Does not contact provider office for questions/concerns Case Manager Clinical Goal(s):  Marland Kitchen Over the next 120 days, patient will verbalize understanding of plan for hypertension management . Over the next 120 days, patient will attend all scheduled medical appointments: pcp on 01-02-2021 . Over the next 120 days, patient will demonstrate improved adherence to prescribed treatment plan for hypertension as evidenced by taking all medications as prescribed, monitoring and recording blood pressure as directed, adhering to low sodium/DASH diet . Over the next 120 days, patient will demonstrate improved health management independence as evidenced by checking blood pressure as directed and notifying PCP if SBP>150 or DBP > 90, taking all medications as prescribe, and adhering to a low sodium diet as discussed. Interventions:  Marland Kitchen UNABLE to independently:mange HTN . Evaluation of current treatment plan related to hypertension self management and patient's adherence to plan as established by provider. 10-04-2020: The patient has good control of her HTN at this time. States she is eating okay. Denies any issues with her blood pressure.  . Provided education to patient re: stroke prevention, s/s of heart attack and stroke, DASH diet, complications of uncontrolled blood pressure. 10-04-2020:  Review of eating habits and the patient verbalized she is watching what she eats and doing well. Denies any new concerns. . Reviewed medications with patient and discussed importance of compliance. 07-26-2020: States compliance with medications regimen. Has not gotten biotin for hair loss yet due to cost constraints but states she will when she gets some money. It is no worse that what it was when she was in the office. 10-04-2020: States compliance with her medications at this time.  . Discussed plans with patient for ongoing care management follow up and provided patient with direct contact  information for care management team . Advised patient, providing education and rationale, to monitor blood pressure daily and record, calling PCP for findings outside established parameters. The patient is currently not taking blood pressures at home and does not feel like she needs to do so.  . Reviewed scheduled/upcoming provider appointments including: 01-02-2021 Patient Goals/Self-Care Activities . Over the next 120 days, patient will:  - UNABLE to independently manage HTN Attends all scheduled provider appointments Calls provider office for new concerns, questions, or BP outside discussed parameters Checks BP and records as discussed- does not check blood pressures at home Follows a low sodium diet/DASH diet - depression screen reviewed - home or ambulatory blood pressure monitoring encouraged Follow Up Plan: Telephone follow up appointment with care management team member scheduled for: 12-06-2020 at 0900 am   Task: [name]: Identify and Monitor Blood Pressure Elevation   Note:   Care Management Activities:    - depression screen reviewed - home or ambulatory blood pressure monitoring encouraged    Notes: Does not take blood pressures at home   Patient Care Plan: [name]: Anxiety    Problem Identified: [name]: Axniety and patient managing with Cannabis   Priority: Medium    Long-Range Goal: [name]: Anxiety  and Cannabis Drug Use Managed   Priority: Medium  Note:   Current Barriers:  Marland Kitchen Knowledge Deficits related to use of Cannabis for control of anxiety . Chronic Disease Management support and education needs related to anxiety . Lacks caregiver support.  . Film/video editor.  . Non-adherence to prescribed medication regimen . Unable to independently manage anxiety . Does not adhere to provider recommendations re: refrain from the use of cannabis to help anxiety . Does not contact provider office for questions/concerns  Nurse Case Manager Clinical Goal(s):  Marland Kitchen Over the next 120 days, patient will verbalize understanding of plan for management of anxiety without the use of cannabis  . Over the next 120 days, patient will work with [facility], [place] team and pcp to address needs related to anxiety and how to effectively manage with alternative methods other than substance abuse.  . Over the next 120 days, patient will demonstrate a decrease in anxiety exacerbations as evidenced by controlled anxiety . Over the next 120 days, patient will attend all scheduled medical appointments: appointment with the pcp on 01-02-2021 Interventions:  . 1:1 collaboration with Venita Lick, NP regarding development and update of comprehensive plan of care as evidenced by provider attestation and co-signature . Inter-disciplinary care team collaboration (see longitudinal plan of care) . Provided education to patient re: alternative methods for dealing with anxiety. 07-26-2020: The patient saw the pcp for situational anxiety on 07-05-2020 and 07-11-2020. She is now taking celexa and Buspar and states this is doing well for her. 10-04-2020: The patient states she is doing well with managing her anxiety at this time. Did have a panic attack recently and misplaced her insurance cards and ID. Was a little stressed about this but had numbers to call and information provided to her by [facility] to assist with calling her insurance  companies to get new insurance cards. Care Guide referral also placed for help with finding a dentist in [facility].  . Discussed plans with patient for ongoing care management follow up and provided patient with direct contact information for care management team . Provided patient with mindfullness educational materials related to helping decrease anxiety. The patient states her son is researching information from a Dr. Ardeth Perfect about how to reduce Lithium  in the diet and wanted the pcp to know.  . Reviewed scheduled/upcoming provider appointments including: 01-02-2021  Patient Goals/Self-Care Activities Over the next 120 days, patient will:  - Patient will self administer medications as prescribed Patient will attend all scheduled provider appointments Patient will call pharmacy for medication refills Patient will call provider office for new concerns or questions Patient will work with [name] to address care coordination needs and will continue to work with the clinical team to address health care and disease management related needs.   - barriers to change identified - brief intervention provided - counseling provided - stage of change monitored - substance misuse monitored *Uses cannabis regularly but is out right now.    Follow Up Plan: Telephone follow up appointment with care management team member scheduled for: 12-06-2020 at 0900       Task: [name]: Identify and Promote Change to Drug Misuse   Note:   Care Management Activities:    - barriers to change identified - brief intervention provided - counseling provided - stage of change monitored - substance misuse monitored    Notes: uses cannabis regularly but says she is out right now.     Patient Care Plan: [name]: HLD Management    Problem Identified: [name]: Health Promotion or Disease Self-Management (General Plan of Care)   Priority: High    Long-Range Goal: [name]: HLD Self-Management Plan Developed   Expected End Date:  09/21/2020  Priority: Medium  Note:   Current Barriers:  . Poorly controlled hyperlipidemia, complicated by HTN, non-compliance and anxiety . Current antihyperlipidemic regimen: Lipitor 25m QD- changed to Rosuvastatin 10 mg daily in February of 2022 . Most recent lipid panel:  . Lab Results .  Component . Value . Date .   .Marland KitchenCHOL . 186 . 07/05/2020 .   .Marland KitchenCHOL . 142 . 03/29/2020 .   .Marland KitchenCHOL . 158 . 09/28/2019 .   .Marland KitchenLab Results .  Component . Value . Date .   .Marland KitchenHDL . 45 . 07/05/2020 .   .Marland KitchenHDL . 41 . 03/29/2020 .   .Marland KitchenHDL . 46 . 09/28/2019 .   .Marland KitchenLab Results .  Component . Value . Date .   .Marland KitchenL[place]. 104 (H) . 07/05/2020 .   .Marland KitchenLB[place]. 70 . 03/29/2020 .   .Marland KitchenLR[place]. 66 . 09/28/2019 .   .Marland KitchenLab Results .  Component . Value . Date .   .Marland KitchenTRIG . 215 (H) . 07/05/2020 .   .Marland KitchenTRIG . 182 (H) . 03/29/2020 .   .Marland KitchenTRIG . 293 (H) . 09/28/2019 .   .Marland KitchenLab Results .  Component . Value . Date .   .Marland KitchenCHOLHDL . 5.2 . 02/16/2019 .   .Marland KitchenNo results found for: LDLDIRECT  . ASCVD risk enhancing conditions: age >>11 pre-DM, HTN, Cannabis use, former smoker . Unable to independently manage HLD . Does not adhere to provider recommendations re: medications at times and heart healthy diet  . Does not contact provider office for questions/concerns  RN Care Manager Clinical Goal(s):  .Marland KitchenOver the next 120 days, patient will work with RConsulting civil engineer providers, and care team towards execution of optimized self-health management plan . Over the next 120 days, patient will verbalize understanding of plan for HLD management  . Over the next 120 days, patient will attend all scheduled medical appointments: 01-02-2021 with pcp . Over the next 120 days, patient will demonstrate improved adherence  to prescribed treatment plan for HLD as evidenced by keeping appointments, compliance with the plan of care and calling for changes   Interventions: Marland Kitchen Medication review performed; medication list updated in electronic medical  record.  Bertram Savin care team collaboration (see longitudinal plan of care) . Referred to pharmacy team for assistance with HLD medication management . Evaluation of current treatment plan related to HLD and patient's adherence to plan as established by provider. . Advised patient to call the provider for changes in condition or questions about health and wellness.  . Provided education to patient re: benefits of following a heart healthy diet and checking blood pressures on a regular basis . Reviewed medications with patient and discussed compliance. 10-04-2020: Confirms compliance with medications regimen . Discussed plans with patient for ongoing care management follow up and provided patient with direct contact information for care management team . Reviewed scheduled/upcoming provider appointments including: 01-02-2021 . Education on active life style and benefits of staying active for health and well being. 10-04-2020: The patient states she is enjoying the warmer weather and being active. She denies any acute distress. States she is following the recommendations of the provider.   Patient Goals/Self-Care Activities: . Over the next 120 days, patient will:   - call for medicine refill 2 or 3 days before it runs out - call if I am sick and can't take my medicine - keep a list of all the medicines I take; vitamins and herbals too - learn to read medicine labels - use a pillbox to sort medicine - use an alarm clock or phone to remind me to take my medicine - drink 6 to 8 glasses of water each day - eat 3 to 5 servings of fruits and vegetables each day - eat 5 or 6 small meals each day - fill half the plate with nonstarchy vegetables - limit fast food meals to no more than 1 per week - manage portion size - prepare main meal at home 3 to 5 days each week - read food labels for fat, fiber, carbohydrates and portion size - set a realistic goal - switch to sugar-free drinks - be  open to making changes - I can manage, know and watch for signs of a heart attack - if I have chest pain, call for help - learn about small changes that will make a big difference - learn my personal risk factors  - barriers to meeting goals identified - change-talk evoked - choices provided - collaboration with team encouraged - decision-making supported - difficulty of making life-long changes acknowledged - health risks reviewed - problem-solving facilitated - questions answered - readiness for change evaluated - reassurance provided - resources needed to meet goals identified - self-reflection promoted - self-reliance encouraged - verbalization of feelings encouraged  Follow Up Plan: Telephone follow up appointment with care management team member scheduled for: 12-06-2020 at 0900 am     Task: [name]: HLD: Mutually Develop and Royce Macadamia Achievement of Patient Goals   Note:   Care Management Activities:    - barriers to meeting goals identified - change-talk evoked - choices provided - collaboration with team encouraged - decision-making supported - difficulty of making life-long changes acknowledged - health risks reviewed - problem-solving facilitated - questions answered - readiness for change evaluated - reassurance provided - resources needed to meet goals identified - self-reflection promoted - self-reliance encouraged - verbalization of feelings encouraged         The patient verbalized understanding of  instructions, educational materials, and care plan provided today and declined offer to receive copy of patient instructions, educational materials, and care plan.   Telephone follow up appointment with care management team member scheduled for: 12-06-2020 at 35 am  [place], MSN, [facility] Mobile: [phone]

## 2020-10-05 ENCOUNTER — Telehealth: Payer: Self-pay

## 2020-10-05 NOTE — Telephone Encounter (Signed)
   Telephone encounter was:  Successful.  10/05/2020 Name: Tina Hardy MRN: [id number] DOB: 1941/03/16  Tina Hardy is a 80 y.o. year old female who is a primary care patient of Cannady, Barbaraann Faster, NP . The community resource team was consulted for assistance with dental providers  Care guide performed the following interventions: Patient provided with information about care guide support team and interviewed to confirm resource needs Spoke with patient confirmed son's email [email or web address]. Emailed [employer]  Follow Up Plan:  Care guide will follow up with patient by phone over the next 7 days  [name], AAS Paralegal, [place] . Embedded Care Coordination [facility]  Care Management  [address], [place] [address] ??[name]  ?? [phone]   [email or web address]

## 2020-10-06 ENCOUNTER — Telehealth: Payer: Self-pay

## 2020-10-06 NOTE — Telephone Encounter (Signed)
   Telephone encounter was:  Successful.  10/06/2020 Name: Makhayla Mcmurry MRN: [id number] DOB: 02-Jun-1940  Orissa Arreaga is a 80 y.o. year old female who is a primary care patient of Cannady, Barbaraann Faster, NP . The community resource team was consulted for assistance with dental provider list  Care guide performed the following interventions: Follow up call placed to the patient to discuss status of referral Spoke with patient she will check with her son to see if he received the email sent on 5/18. She requested that I mail the resources to her home address.  Sent information with patient resource letter to the printer at [place] will mail to patient. Patient stated she would call me when she receives the letter.  Gave patient my contact number. .  Follow Up Plan:  No further follow up planned at this time. The patient has been provided with needed resources.  Dalisha Shively, AAS Paralegal, [place] . Embedded Care Coordination [facility]  Care Management  [address], [place] [address] ??[email or web address]  ?? [phone]   [email or web address]

## 2020-10-10 ENCOUNTER — Telehealth: Payer: Self-pay | Admitting: Licensed Clinical Social Worker

## 2020-10-10 ENCOUNTER — Telehealth: Payer: Self-pay

## 2020-10-10 ENCOUNTER — Other Ambulatory Visit: Payer: Self-pay

## 2020-10-10 MED ORDER — CLOPIDOGREL BISULFATE 75 MG PO TABS: 75.0000 mg | ORAL_TABLET | Freq: Every day | ORAL | 4 refills | Status: DC

## 2020-10-10 NOTE — Telephone Encounter (Signed)
    Clinical Social Work  Chronic Care Management   Phone Outreach    10/10/2020 Name: Jelesa Mangini MRN: [id number] DOB: 07/02/1940  Naava Janeway is a 80 y.o. year old female who is a primary care patient of Cannady, Barbaraann Faster, NP .   CCM LCSW reached out to patient today by phone to introduce self, assess needs and offer Care Management services and interventions.    Telephone outreach was unsuccessful  Plan:CCM LCSW will wait for return call. If no return call is received, Will route chart to Care Guide to see if patient would like to reschedule phone appointment   Review of patient status, including review of consultants reports, relevant laboratory and other test results, and collaboration with appropriate care team members and the patient's provider was performed as part of comprehensive patient evaluation and provision of care management services.    Christa See, MSW, [email or web address] Phone [phone] 4:29 PM

## 2020-10-19 ENCOUNTER — Telehealth: Payer: Self-pay | Admitting: Nurse Practitioner

## 2020-10-19 NOTE — Telephone Encounter (Signed)
Noted  

## 2020-10-19 NOTE — Telephone Encounter (Signed)
Patient  called wanting to see if she can get off some of her 7 medications. Please call backed

## 2020-10-19 NOTE — Telephone Encounter (Signed)
FYI Scheduled pt for next available apt 11/04/2020 at 11:20

## 2020-10-20 NOTE — Telephone Encounter (Signed)
Patient has been rescheduled.

## 2020-10-28 ENCOUNTER — Ambulatory Visit: Payer: Medicare HMO

## 2020-10-28 ENCOUNTER — Telehealth: Payer: Self-pay

## 2020-10-28 NOTE — Telephone Encounter (Signed)
This nurse attempted to call patient three times for telephonic AWV. At 1340, 1345, and 1355. Message left to call and reschedule for another time.

## 2020-10-31 ENCOUNTER — Telehealth: Payer: Self-pay

## 2020-10-31 NOTE — Telephone Encounter (Signed)
Copied from [place] [id number]. Topic: General - Other >> Oct 31, 2020  9:16 AM Loma [place] wrote: Reason for CRM: Pt has called just to let [name] know she has been very sick with cough and congestion and has felt terrible. States this am much better and does not need appt. Has been very congested. Does not know if  had fever but just in general has felt horrible. Denies needing an appt and states much better, son was also sick, pt does have an appt with [name] on Fri, 17th if need to touch base with her about that. [phone]

## 2020-11-01 NOTE — Telephone Encounter (Signed)
Pt verbalized understanding.

## 2020-11-04 ENCOUNTER — Encounter: Payer: Self-pay | Admitting: Nurse Practitioner

## 2020-11-04 ENCOUNTER — Telehealth: Payer: Self-pay | Admitting: Pharmacist

## 2020-11-04 ENCOUNTER — Ambulatory Visit (INDEPENDENT_AMBULATORY_CARE_PROVIDER_SITE_OTHER): Payer: Medicare HMO | Admitting: Nurse Practitioner

## 2020-11-04 ENCOUNTER — Other Ambulatory Visit: Payer: Self-pay

## 2020-11-04 ENCOUNTER — Ambulatory Visit (INDEPENDENT_AMBULATORY_CARE_PROVIDER_SITE_OTHER): Payer: Medicare HMO

## 2020-11-04 VITALS — Ht 63.5 in | Wt 152.0 lb

## 2020-11-04 VITALS — BP 118/76 | HR 79 | Temp 97.6°F | Wt 152.8 lb

## 2020-11-04 DIAGNOSIS — R69 Illness, unspecified: Secondary | ICD-10-CM | POA: Diagnosis not present

## 2020-11-04 DIAGNOSIS — Z0000 Encounter for general adult medical examination without abnormal findings: Secondary | ICD-10-CM

## 2020-11-04 DIAGNOSIS — F418 Other specified anxiety disorders: Secondary | ICD-10-CM

## 2020-11-04 DIAGNOSIS — F1290 Cannabis use, unspecified, uncomplicated: Secondary | ICD-10-CM

## 2020-11-04 NOTE — Progress Notes (Signed)
BP 118/76   Pulse 79   Temp 97.6 F (36.4 C) (Oral)   Wt 152 lb 12.8 oz (69.3 kg)   SpO2 98%   BMI 26.61 kg/m    Subjective:    Patient ID: Tina Hardy, female    DOB: 04-20-1941, 80 y.o.   MRN: [id number]  HPI: Tina Hardy is a 80 y.o. female  Chief Complaint  Patient presents with   Medication Problem    Patient is here to discuss medication problem. Patient states she would like to discuss coming off of some of her medications. Patient states she has been feeling tired within the last week, but she is feeling better.   ANXIETY/STRESS Has long standing anxiety history, does smoke occasional "dubey" to help with this at times.  Takes every now and then.  Started Celexa this year, which she reports has helped -- does not have any anxiety at this time and reports wishes to come off Celexa and Buspar as feels it has made her lose her mojo.  In past had taken benzo, which she reports helped.  Discussed at length with her these are not recommended in her age group. Duration: stable Anxious mood: yes  Excessive worrying: none Irritability: no  Sweating: no Nausea: no Palpitations:no Hyperventilation: no Panic attacks: none Agoraphobia: no  Obscessions/compulsions: no Depressed mood: no Depression screen [facility] 2/9 11/04/2020 11/04/2020 07/05/2020 03/29/2020 11/04/2019  Decreased Interest 0 0 0 0 0  Down, Depressed, Hopeless 0 0 0 0 0  PHQ - 2 Score 0 0 0 0 0  Altered sleeping 0 0 0 3 -  Tired, decreased energy 0 0 0 0 -  Change in appetite 0 0 0 0 -  Feeling bad or failure about yourself  0 0 0 0 -  Trouble concentrating 0 0 0 0 -  Moving slowly or fidgety/restless 0 0 0 0 -  Suicidal thoughts 0 0 0 0 -  PHQ-9 Score 0 0 0 3 -  Difficult doing work/chores Not difficult at all Not difficult at all Not difficult at all Not difficult at all -   Anhedonia: no Weight changes: no Insomnia: none Hypersomnia: no Fatigue/loss of energy: no Feelings of worthlessness:  no Feelings of guilt: no Impaired concentration/indecisiveness: none Suicidal ideations: no  Crying spells: no Recent Stressors/Life Changes: yes   Relationship problems: no   Family stress: none   Financial stress: no    Job stress: no    Recent death/loss: no  GAD 7 : Generalized Anxiety Score 11/04/2020 08/23/2020 07/05/2020 09/28/2019  Nervous, Anxious, on Edge 0 0 0 0  Control/stop worrying 0 0 0 0  Worry too much - different things 0 0 0 0  Trouble relaxing 0 0 0 0  Restless 0 0 0 0  Easily annoyed or irritable 0 0 0 0  Afraid - awful might happen 0 0 0 0  Total GAD 7 Score 0 0 0 0  Anxiety Difficulty - - Not difficult at all Not difficult at all     Relevant past medical, surgical, family and social history reviewed and updated as indicated. Interim medical history since our last visit reviewed. Allergies and medications reviewed and updated.  Review of Systems  Constitutional:  Negative for activity change, appetite change, diaphoresis, fatigue and fever.  Respiratory:  Negative for cough, chest tightness and shortness of breath.   Cardiovascular:  Negative for chest pain, palpitations and leg swelling.  Gastrointestinal: Negative.   Endocrine: Negative.   Neurological:  Negative.   Psychiatric/Behavioral: Negative.     Per HPI unless specifically indicated above     Objective:    BP 118/76   Pulse 79   Temp 97.6 F (36.4 C) (Oral)   Wt 152 lb 12.8 oz (69.3 kg)   SpO2 98%   BMI 26.61 kg/m   Wt Readings from Last 3 Encounters:  11/04/20 152 lb 12.8 oz (69.3 kg)  08/23/20 157 lb (71.2 kg)  07/11/20 153 lb (69.4 kg)    Physical Exam Vitals and nursing note reviewed.  Constitutional:      General: She is awake. She is not in acute distress.    Appearance: She is well-developed and well-groomed. She is not ill-appearing.  HENT:     Head: Normocephalic and atraumatic. Hair is normal.     Right Ear: Hearing normal.     Left Ear: Hearing normal.  Eyes:      General: Lids are normal.     Extraocular Movements: Extraocular movements intact.     Pupils: Pupils are equal, round, and reactive to light.  Neck:     Thyroid: No thyromegaly.     Vascular: No carotid bruit or JVD.  Cardiovascular:     Rate and Rhythm: Normal rate and regular rhythm.     Heart sounds: Normal heart sounds. No murmur heard.   No gallop.  Pulmonary:     Effort: Pulmonary effort is normal.     Breath sounds: Normal breath sounds.  Abdominal:     General: Bowel sounds are normal.     Palpations: Abdomen is soft. There is no hepatomegaly or splenomegaly.  Musculoskeletal:     Cervical back: Normal range of motion and neck supple.     Right lower leg: No edema.     Left lower leg: No edema.  Lymphadenopathy:     Cervical: No cervical adenopathy.  Skin:    General: Skin is warm and dry.  Neurological:     Mental Status: She is alert and oriented to person, place, and time.  Psychiatric:        Attention and Perception: Attention normal.        Mood and Affect: Mood normal.        Behavior: Behavior normal. Behavior is cooperative.        Thought Content: Thought content normal.        Judgment: Judgment normal.   Results for orders placed or performed in visit on 07/05/20  Lipid Panel w/o Chol/HDL Ratio  Result Value Ref Range   Cholesterol, Total 186 100 - 199 mg/dL   Triglycerides 215 (H) 0 - 149 mg/dL   HDL 45 >39 mg/dL   VLDL Cholesterol Cal 37 5 - 40 mg/dL   LDL Chol Calc (NIH) 104 (H) 0 - 99 mg/dL  Basic metabolic panel  Result Value Ref Range   Glucose 97 65 - 99 mg/dL   BUN 13 8 - 27 mg/dL   Creatinine, Ser 0.70 0.57 - 1.00 mg/dL   GFR calc non Af Amer 83 >59 mL/min/1.73   GFR calc Af Amer 96 >59 mL/min/1.73   BUN/Creatinine Ratio 19 12 - 28   Sodium 140 134 - 144 mmol/L   Potassium 4.3 3.5 - 5.2 mmol/L   Chloride 103 96 - 106 mmol/L   CO2 22 20 - 29 mmol/L   Calcium 9.6 8.7 - 10.3 mg/dL  HgB A1c  Result Value Ref Range   Hgb A1c MFr Bld 5.5  4.8 -  5.6 %   Est. average glucose Bld gHb Est-mCnc 111 mg/dL  Hepatitis C antibody  Result Value Ref Range   Hep C Virus Ab <0.1 0.0 - 0.9 s/co ratio  TSH  Result Value Ref Range   TSH 3.460 0.450 - 4.500 uIU/mL      Assessment & Plan:   Problem List Items Addressed This Visit       Other   Marijuana use    Reports occasional use.  Recommend cutting back on this.  Continue to recommend no use.       Situational anxiety - Primary    Ongoing and chronic. Denies SI/HI.  Will work on slow reduction off Celexa and Buspar at home, wrote down instructions for this, see if tolerates as she wishes to minimize medications.  Would avoid benzo use due to age and risk for chronic use.  Does not wish to attend psychiatry.  Return in 6 months.       Relevant Medications   busPIRone (BUSPAR) 5 MG tablet     Follow up plan: Return for on 01/02/21 as scheduled.
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
## 2020-11-04 NOTE — Patient Instructions (Signed)
Celexa and Buspar you may slowly come off of if you feel you do not need them for anxiety -- do not cold [place] stop, but instead slowly decrease to every other day, then every third day, then every fourth day.     Continue Plavix, Amlodipine, Rosuvastatin, and Aspirin -- stroke prevention!!  [email or web address]">  Generalized Anxiety Disorder, Adult Generalized anxiety disorder (GAD) is a mental health condition. Unlike normal worries, anxiety related to GAD is not triggered by a specific event. These worries do not fade or get better with time. GAD interferes with relationships,work, and school. GAD symptoms can vary from mild to severe. People with severe GAD can have intense waves of anxiety with physical symptoms that are similar to panicattacks. What are the causes? The exact cause of GAD is not known, but the following are believed to have an impact: Differences in natural brain chemicals. Genes passed down from parents to children. Differences in the way threats are perceived. Development during childhood. Personality. What increases the risk? The following factors may make you more likely to develop this condition: Being female. Having a family history of anxiety disorders. Being very shy. Experiencing very stressful life events, such as the death of a loved one. Having a very stressful family environment. What are the signs or symptoms? People with GAD often worry excessively about many things in their lives, such as their health and family. Symptoms may also include: Mental and emotional symptoms: Worrying excessively about natural disasters. Fear of being late. Difficulty concentrating. Fears that others are judging your performance. Physical symptoms: Fatigue. Headaches, muscle tension, muscle twitches, trembling, or feeling shaky. Feeling like your heart is pounding or beating very fast. Feeling out of breath or like you cannot take a deep breath. Having trouble  falling asleep or staying asleep, or experiencing restlessness. Sweating. Nausea, diarrhea, or irritable bowel syndrome (IBS). Behavioral symptoms: Experiencing erratic moods or irritability. Avoidance of new situations. Avoidance of people. Extreme difficulty making decisions. How is this diagnosed? This condition is diagnosed based on your symptoms and medical history. You will also have a physical exam. Your health care provider may perform tests torule out other possible causes of your symptoms. To be diagnosed with GAD, a person must have anxiety that: Is out of his or her control. Affects several different aspects of his or her life, such as work and relationships. Causes distress that makes him or her unable to take part in normal activities. Includes at least three symptoms of GAD, such as restlessness, fatigue, trouble concentrating, irritability, muscle tension, or sleep problems. Before your health care provider can confirm a diagnosis of GAD, these symptoms must be present more days than they are not, and they must last for 6 months orlonger. How is this treated? This condition may be treated with: Medicine. Antidepressant medicine is usually prescribed for long-term daily control. Anti-anxiety medicines may be added in severe cases, especially when panic attacks occur. Talk therapy (psychotherapy). Certain types of talk therapy can be helpful in treating GAD by providing support, education, and guidance. Options include: Cognitive behavioral therapy (CBT). People learn coping skills and self-calming techniques to ease their physical symptoms. They learn to identify unrealistic thoughts and behaviors and to replace them with more appropriate thoughts and behaviors. Acceptance and commitment therapy (ACT). This treatment teaches people how to be mindful as a way to cope with unwanted thoughts and feelings. Biofeedback. This process trains you to manage your body's response  (physiological response) through breathing techniques and relaxation  methods. You will work with a therapist while machines are used to monitor your physical symptoms. Stress management techniques. These include yoga, meditation, and exercise. A mental health specialist can help determine which treatment is best for you. Some people see improvement with one type of therapy. However, other peoplerequire a combination of therapies. Follow these instructions at home: Lifestyle Maintain a consistent routine and schedule. Anticipate stressful situations. Create a plan, and allow extra time to work with your plan. Practice stress management or self-calming techniques that you have learned from your therapist or your health care provider. General instructions Take over-the-counter and prescription medicines only as told by your health care provider. Understand that you are likely to have setbacks. Accept this and be kind to yourself as you persist to take better care of yourself. Recognize and accept your accomplishments, even if you judge them as small. Keep all follow-up visits as told by your health care provider. This is important. Contact a health care provider if: Your symptoms do not get better. Your symptoms get worse. You have signs of depression, such as: A persistently sad or irritable mood. Loss of enjoyment in activities that used to bring you joy. Change in weight or eating. Changes in sleeping habits. Avoiding friends or family members. Loss of energy for normal tasks. Feelings of guilt or worthlessness. Get help right away if: You have serious thoughts about hurting yourself or others. If you ever feel like you may hurt yourself or others, or have thoughts about taking your own life, get help right away. Go to your nearest emergency department or: Call your local emergency services (911 in the [place] Call a suicide crisis helpline, such as the [place]  at 913 360 2447. This is open 24 hours a day in the [place] Text the Crisis Text Line at 787 754 8934 (in the [place].). Summary Generalized anxiety disorder (GAD) is a mental health condition that involves worry that is not triggered by a specific event. People with GAD often worry excessively about many things in their lives, such as their health and family. GAD may cause symptoms such as restlessness, trouble concentrating, sleep problems, frequent sweating, nausea, diarrhea, headaches, and trembling or muscle twitching. A mental health specialist can help determine which treatment is best for you. Some people see improvement with one type of therapy. However, other people require a combination of therapies. This information is not intended to replace advice given to you by your health care provider. Make sure you discuss any questions you have with your healthcare provider. Document Revised: 02/25/2019 Document Reviewed: 02/25/2019 Elsevier Patient Education  [place].

## 2020-11-04 NOTE — Chronic Care Management (AMB) (Signed)
    Chronic Care Management Pharmacy Assistant   Name: Tina Hardy  MRN: [id number] DOB: [birth year]  Reason for Encounter: Disease State General Adherence    Recent office visits:  11/04/20-[name] Ned Card, NP (PCP) Seen for anxiety. Will work on slow reduction off Celexa and Buspar at home. Follow up in 6 months. 08/23/20- Marnee Guarneri, NP (PCP) Follow up on Anxiety. Follow up n 6 months. 07/11/20-[name] Ned Card, NP (PCP) Seen for Situational anxiety. Start Celexa 10 MG daily and continue Buspar 5 MG BID, this may offer overall benefit to anxiety. Follow up in 6 weeks. 07/05/20-[name] Cannady, NP (PCP) Seen for Hair/scalp problem. Stop Atorvastatin and start on Rosuvastatin 10 mg. Labs ordered. Follow up in 6 months. Recent consult visits:  None noted  Hospital visits:  None in previous 6 months  Medications: Outpatient Encounter Medications as of 11/04/2020  Medication Sig   amLODipine (NORVASC) 5 MG tablet Take 1 tablet (5 mg total) by mouth daily.   aspirin EC 81 MG EC tablet Take 1 tablet (81 mg total) by mouth daily.   busPIRone (BUSPAR) 5 MG tablet Take 5 mg by mouth 2 (two) times daily.   citalopram (CELEXA) 10 MG tablet Take 1 tablet (10 mg total) by mouth daily.   clopidogrel (PLAVIX) 75 MG tablet Take 1 tablet (75 mg total) by mouth daily at 6 (six) AM.   rosuvastatin (CRESTOR) 10 MG tablet Take 1 tablet (10 mg total) by mouth daily.   No facility-administered encounter medications on file as of 11/04/2020.   Have you had any problems recently with your health? Patient states she has no problems with her health recently.  Have you had any problems with your pharmacy? Patient states she has not problems with her pharmacy.  What issues or side effects are you having with your medications? Patient states she has no issues or side effects with her medications.  What would you like me to pass along to [facility] for them to help you with?  Patient states she  would like to cut back on some of her medications.  What can we do to take care of you better? Patient states there is nothing at this time.  Star Rating Drugs: Rosuvastatin 10 mg Last filled:10/08/20 90 DS   Myriam Elta [place], [place]

## 2020-11-04 NOTE — Assessment & Plan Note (Signed)
Reports occasional use.  Recommend cutting back on this.  Continue to recommend no use.

## 2020-11-04 NOTE — Patient Instructions (Signed)
Tina Hardy , Thank you for taking time to come for your Medicare Wellness Visit. I appreciate your ongoing commitment to your health goals. Please review the following plan we discussed and let me know if I can assist you in the future.   Screening recommendations/referrals: Colonoscopy: not required Mammogram: not required Bone Density: completed 08/27/2011 Recommended yearly ophthalmology/optometry visit for glaucoma screening and checkup Recommended yearly dental visit for hygiene and checkup  Vaccinations: Influenza vaccine: decline Pneumococcal vaccine: decline Tdap vaccine: decline Shingles vaccine: decline   Covid-19: decline  Advanced directives: Advance directive discussed with you today.   Conditions/risks identified: none  Next appointment: Follow up in one year for your annual wellness visit    Preventive Care 65 Years and Older, Female Preventive care refers to lifestyle choices and visits with your health care provider that can promote health and wellness. What does preventive care include? A yearly physical exam. This is also called an annual well check. Dental exams once or twice a year. Routine eye exams. Ask your health care provider how often you should have your eyes checked. Personal lifestyle choices, including: Daily care of your teeth and gums. Regular physical activity. Eating a healthy diet. Avoiding tobacco and drug use. Limiting alcohol use. Practicing safe sex. Taking low-dose aspirin every day. Taking vitamin and mineral supplements as recommended by your health care provider. What happens during an annual well check? The services and screenings done by your health care provider during your annual well check will depend on your age, overall health, lifestyle risk factors, and family history of disease. Counseling  Your health care provider may ask you questions about your: Alcohol use. Tobacco use. Drug use. Emotional well-being. Home and  relationship well-being. Sexual activity. Eating habits. History of falls. Memory and ability to understand (cognition). Work and work Statistician. Reproductive health. Screening  You may have the following tests or measurements: Height, weight, and BMI. Blood pressure. Lipid and cholesterol levels. These may be checked every 5 years, or more frequently if you are over 33 years old. Skin check. Lung cancer screening. You may have this screening every year starting at age 32 if you have a 30-pack-year history of smoking and currently smoke or have quit within the past 15 years. Fecal occult blood test (FOBT) of the stool. You may have this test every year starting at age 20. Flexible sigmoidoscopy or colonoscopy. You may have a sigmoidoscopy every 5 years or a colonoscopy every 10 years starting at age 55. Hepatitis C blood test. Hepatitis B blood test. Sexually transmitted disease (STD) testing. Diabetes screening. This is done by checking your blood sugar (glucose) after you have not eaten for a while (fasting). You may have this done every 1-3 years. Bone density scan. This is done to screen for osteoporosis. You may have this done starting at age 33. Mammogram. This may be done every 1-2 years. Talk to your health care provider about how often you should have regular mammograms. Talk with your health care provider about your test results, treatment options, and if necessary, the need for more tests. Vaccines  Your health care provider may recommend certain vaccines, such as: Influenza vaccine. This is recommended every year. Tetanus, diphtheria, and acellular pertussis (Tdap, Td) vaccine. You may need a Td booster every 10 years. Zoster vaccine. You may need this after age 62. Pneumococcal 13-valent conjugate (PCV13) vaccine. One dose is recommended after age 58. Pneumococcal polysaccharide (PPSV23) vaccine. One dose is recommended after age 59. Talk to  your health care provider  about which screenings and vaccines you need and how often you need them. This information is not intended to replace advice given to you by your health care provider. Make sure you discuss any questions you have with your health care provider. Document Released: 06/03/2015 Document Revised: 01/25/2016 Document Reviewed: 03/08/2015 Elsevier Interactive Patient Education  2017 [place] Prevention in the Home Falls can cause injuries. They can happen to people of all ages. There are many things you can do to make your home safe and to help prevent falls. What can I do on the outside of my home? Regularly fix the edges of walkways and driveways and fix any cracks. Remove anything that might make you trip as you walk through a door, such as a raised step or threshold. Trim any bushes or trees on the path to your home. Use bright outdoor lighting. Clear any walking paths of anything that might make someone trip, such as rocks or tools. Regularly check to see if handrails are loose or broken. Make sure that both sides of any steps have handrails. Any raised decks and porches should have guardrails on the edges. Have any leaves, snow, or ice cleared regularly. Use sand or salt on walking paths during winter. Clean up any spills in your garage right away. This includes oil or grease spills. What can I do in the bathroom? Use night lights. Install grab bars by the toilet and in the tub and shower. Do not use towel bars as grab bars. Use non-skid mats or decals in the tub or shower. If you need to sit down in the shower, use a plastic, non-slip stool. Keep the floor dry. Clean up any water that spills on the floor as soon as it happens. Remove soap buildup in the tub or shower regularly. Attach bath mats securely with double-sided non-slip rug tape. Do not have throw rugs and other things on the floor that can make you trip. What can I do in the bedroom? Use night lights. Make sure  that you have a light by your bed that is easy to reach. Do not use any sheets or blankets that are too big for your bed. They should not hang down onto the floor. Have a firm chair that has side arms. You can use this for support while you get dressed. Do not have throw rugs and other things on the floor that can make you trip. What can I do in the kitchen? Clean up any spills right away. Avoid walking on wet floors. Keep items that you use a lot in easy-to-reach places. If you need to reach something above you, use a strong step stool that has a grab bar. Keep electrical cords out of the way. Do not use floor polish or wax that makes floors slippery. If you must use wax, use non-skid floor wax. Do not have throw rugs and other things on the floor that can make you trip. What can I do with my stairs? Do not leave any items on the stairs. Make sure that there are handrails on both sides of the stairs and use them. Fix handrails that are broken or loose. Make sure that handrails are as long as the stairways. Check any carpeting to make sure that it is firmly attached to the stairs. Fix any carpet that is loose or worn. Avoid having throw rugs at the top or bottom of the stairs. If you do have throw rugs, attach  them to the floor with carpet tape. Make sure that you have a light switch at the top of the stairs and the bottom of the stairs. If you do not have them, ask someone to add them for you. What else can I do to help prevent falls? Wear shoes that: Do not have high heels. Have rubber bottoms. Are comfortable and fit you well. Are closed at the toe. Do not wear sandals. If you use a stepladder: Make sure that it is fully opened. Do not climb a closed stepladder. Make sure that both sides of the stepladder are locked into place. Ask someone to hold it for you, if possible. Clearly mark and make sure that you can see: Any grab bars or handrails. First and last steps. Where the edge of  each step is. Use tools that help you move around (mobility aids) if they are needed. These include: Canes. Walkers. Scooters. Crutches. Turn on the lights when you go into a dark area. Replace any light bulbs as soon as they burn out. Set up your furniture so you have a clear path. Avoid moving your furniture around. If any of your floors are uneven, fix them. If there are any pets around you, be aware of where they are. Review your medicines with your doctor. Some medicines can make you feel dizzy. This can increase your chance of falling. Ask your doctor what other things that you can do to help prevent falls. This information is not intended to replace advice given to you by your health care provider. Make sure you discuss any questions you have with your health care provider. Document Released: 03/03/2009 Document Revised: 10/13/2015 Document Reviewed: 06/11/2014 Elsevier Interactive Patient Education  2017 [employer].

## 2020-11-04 NOTE — Assessment & Plan Note (Signed)
Ongoing and chronic. Denies SI/HI.  Will work on slow reduction off Celexa and Buspar at home, wrote down instructions for this, see if tolerates as she wishes to minimize medications.  Would avoid benzo use due to age and risk for chronic use.  Does not wish to attend psychiatry.  Return in 6 months.

## 2020-11-23 ENCOUNTER — Telehealth: Payer: Self-pay

## 2020-11-23 NOTE — Telephone Encounter (Signed)
Called pt to advise her that she will need an appt. Pt states that she is ok just doesn't like taking all of her medicines. Pt is scheduled 8/15 and says that she is fine to wait.   Copied from [place] [id number]. Topic: General - Inquiry >> Nov 23, 2020 12:53 PM Loma [place] wrote: Pt [phone] pt has called in very chipper and states that she thinks that since she is doing so well she would like a cb from [name]'s nurse to see if they can stop some of her medications.

## 2020-11-28 ENCOUNTER — Ambulatory Visit (INDEPENDENT_AMBULATORY_CARE_PROVIDER_SITE_OTHER): Payer: Medicare HMO | Admitting: Licensed Clinical Social Worker

## 2020-11-28 DIAGNOSIS — I10 Essential (primary) hypertension: Secondary | ICD-10-CM

## 2020-11-28 DIAGNOSIS — F418 Other specified anxiety disorders: Secondary | ICD-10-CM

## 2020-11-28 NOTE — Patient Instructions (Signed)
Visit Information   Goals Addressed               This Visit's Progress     Patient Stated     "I struggle with anxiety." (pt-stated)   On track     Patient Self Care Activities:  Attend all scheduled appointments Call [facility] [phone]  Continue with compliance of taking medication        Other     -SW Manage My Emotions   On track     Timeframe:  Long-Range Goal Priority:  Medium Start Date:  08/19/20                           Expected End Date:   03/20/21                  Follow Up Date- 02/03/21   Patient Self Care Activities:  Attend all scheduled appointments Call [facility] [phone]  Continue with compliance of taking medication          Patient verbalizes understanding of instructions provided today.   Telephone follow up appointment with care management team member scheduled for:02/03/21  Christa See, MSW, [email or web address] Phone [phone] 10:20 AM

## 2020-11-28 NOTE — Chronic Care Management (AMB) (Signed)
Chronic Care Management    Clinical Social Work Note  11/28/2020 Name: Hibba Schram MRN: [id number] DOB: Oct 16, 1940  Maguire Killmer is a 80 y.o. year old female who is a primary care patient of Cannady, Barbaraann Faster, NP. The CCM team was consulted to assist the patient with chronic disease management and/or care coordination needs related to: Mental Health Counseling and Resources.   Engaged with patient by telephone for follow up visit in response to provider referral for social work chronic care management and care coordination services.   Consent to Services:  The patient was given information about Chronic Care Management services, agreed to services, and gave verbal consent prior to initiation of services.  Please see initial visit note for detailed documentation.   Patient agreed to services and consent obtained.   Assessment: Patient is engaged in conversation, continues to maintain positive progress with care plan goals. Patient is compliant with medication management and is looking forward to upcoming appt with PCP to review medications. CCM LCSW completed referral to [facility] to assist transportation barriers. Healthy coping skills were discussed. See Care Plan below for interventions and patient self-care actives. Recent life changes [place]: Transportation barriers Recommendation: Patient may benefit from, and is in agreement to work with LCSW to address care coordination needs and will continue to work with the clinical team to address health care and disease management related needs.  Follow up Plan: Patient would like continued follow-up from CCM LCSW .  Follow up scheduled in on 02/03/21. Patient will call office if needed prior to next encounter.    SDOH (Social Determinants of Health) assessments and interventions performed:    Advanced Directives Status: Not addressed in this encounter.  CCM Care Plan  Allergies  Allergen Reactions   Codeine Other (See  Comments)    Unknown reaction    Penicillins Other (See Comments)    Unknown reaction  Has patient had a PCN reaction causing immediate rash, facial/tongue/throat swelling, SOB or lightheadedness with hypotension: n/a Has patient had a PCN reaction causing severe rash involving mucus membranes or skin necrosis: n/a Has patient had a PCN reaction that required hospitalization: n/a Has patient had a PCN reaction occurring within the last 10 years: n/a If all of the above answers are "NO", then may proceed with Cephalosporin use.     Outpatient Encounter Medications as of 11/28/2020  Medication Sig   amLODipine (NORVASC) 5 MG tablet Take 1 tablet (5 mg total) by mouth daily.   aspirin EC 81 MG EC tablet Take 1 tablet (81 mg total) by mouth daily.   busPIRone (BUSPAR) 5 MG tablet Take 5 mg by mouth 2 (two) times daily.   citalopram (CELEXA) 10 MG tablet Take 1 tablet (10 mg total) by mouth daily.   clopidogrel (PLAVIX) 75 MG tablet Take 1 tablet (75 mg total) by mouth daily at 6 (six) AM.   rosuvastatin (CRESTOR) 10 MG tablet Take 1 tablet (10 mg total) by mouth daily.   No facility-administered encounter medications on file as of 11/28/2020.    Patient Active Problem List   Diagnosis Date Noted   DDD (degenerative disc disease), lumbar 06/18/2020   Aortic atherosclerosis ([place]) 06/18/2020   History of cerebellar stroke 06/18/2020   Elevated hemoglobin A1c 05/26/2019   Aortic valve sclerosis 04/24/2019   Situational anxiety 04/24/2019   Stenosis of left carotid artery 03/23/2019   Insomnia 03/23/2019   Marijuana use 03/23/2019   Hypertension 03/22/2019   Hyperlipidemia 03/22/2019   Ludwig's angina  07/04/2017   Hepatic hemangioma 07/16/2012   Diverticulosis of colon 06/22/2008    Conditions to be addressed/monitored: Anxiety; Mental Health Concerns   Care Plan : General Social Work (Adult)  Updates made by Christa See D, LCSW since 11/28/2020 12:00 AM     Problem: Anxiety  Identification (Anxiety)      Long-Range Goal: Anxiety Symptoms Identified   Start Date: 08/19/2020  This Visit's Progress: On track  Priority: Medium  Note:   Timeframe:  Long-Range Goal Priority:  Medium Start Date:  08/19/20                           Expected End Date:   03/20/21                  Follow Up Date- 02/03/21   Current Barriers:  Chronic Mental Health needs related to generalized anxiety disorder Mental Health Concerns  Social Isolation Transportation Barriers-unable to get to appointments as her car no longer works. Suicidal Ideation/Homicidal Ideation: No  Clinical Social Work Goal(s):  Over the next 120 days, patient will work with SW bi-monthly by telephone or in person to reduce or manage symptoms related to anxiety and stress  Over the next 120 days, patient will demonstrate improved health management independence as evidenced by implementing appropriate anxiety management coping skills and self-care into her daily routine to combat future symptoms  Interventions: Assessed patient's previous and current treatment, coping skills, support system and barriers to care  Patient interviewed and appropriate assessments performed: brief mental health assessment Patient reports that she is doing well. States that she is interested in reducing the number of medications she is on, which she has discussed with PCP Patient plans on bringing all of her medications to upcoming appointment with PCP scheduled for 12/05/20 to review CCM LCSW inquired about any barriers, in regards, to getting to appointment. Patient shared that she is need of transportation due to adult son being out of town with her car. CCM LCSW provided patient with the contact number for [facility] to initiate services for upcoming appointment. Patient agreed to contact CCM LCSW with any questions or concerns Patient reports that her anxiety symptoms have been managed well. She understands how past grief  and stress negatively impacted her mental health  Patient enjoys spending time outside and has a strong spiritual relationship that assists with promoting positive mood Mindfulness or Relaxation Training, Active listening / Reflection utilized , and Emotional Supportive Provided Discussed plans with patient for ongoing care management follow up and provided patient with direct contact information for care management team Emotional/Supportive Counseling provided during session. Patient was receptive to anxiety management education.  CCM LCSW collaborated with [employer] [phone] A new referral was placed for patient Collaboration with PCP regarding development and update of comprehensive plan of care as evidenced by provider attestation and co-signature Inter-disciplinary care team collaboration (see longitudinal plan of care)  Patient Self Care Activities:  Attend all scheduled appointments Call [facility] [phone]  Continue with compliance of taking medication       Christa See, MSW, [email or web address] Phone [phone] 10:21 AM

## 2020-11-30 ENCOUNTER — Telehealth: Payer: Self-pay | Admitting: Nurse Practitioner

## 2020-11-30 NOTE — Telephone Encounter (Signed)
   Tina Hardy DOB: June 24, 1940 MRN: [id number]   RIDER WAIVER AND RELEASE OF LIABILITY  For purposes of improving physical access to our facilities, [facility] is pleased to partner with third parties to provide [facility] patients or other authorized individuals the option of convenient, on-demand ground transportation services (the [place]") through use of the technology service that enables users to request on-demand ground transportation from independent third-party providers.  By opting to use and accept these [employer], I, the undersigned, hereby agree on behalf of myself, and on behalf of any minor child using the Government social research officer for whom I am the parent or legal guardian, as follows:  Government social research officer provided to me are provided by independent third-party transportation providers who are not [employer] or employees and who are unaffiliated with [employer]. [facility] is neither a transportation carrier nor a common or public carrier. [facility] has no control over the quality or safety of the transportation that occurs as a result of the [employer]. [facility] cannot guarantee that any third-party transportation provider will complete any arranged transportation service. [facility] makes no representation, warranty, or guarantee regarding the reliability, timeliness, quality, safety, suitability, or availability of any of the Transport Services or that they will be error free. I fully understand that traveling by vehicle involves risks and dangers of serious bodily injury, including permanent disability, paralysis, and death. I agree, on behalf of myself and on behalf of any minor child using the Transport Services for whom I am the parent or legal guardian, that the entire risk arising out of my use of the [employer] remains solely with me, to the maximum extent permitted under applicable law. The [employer] are provided "as  is" and "as available." [facility] disclaims all representations and warranties, express, implied or statutory, not expressly set out in these terms, including the implied warranties of merchantability and fitness for a particular purpose. I hereby waive and release [facility], its agents, employees, officers, directors, representatives, insurers, attorneys, assigns, successors, subsidiaries, and affiliates from any and all past, present, or future claims, demands, liabilities, actions, causes of action, or suits of any kind directly or indirectly arising from acceptance and use of the [employer]. I further waive and release [facility] and its affiliates from all present and future liability and responsibility for any injury or death to persons or damages to property caused by or related to the use of the [employer]. I have read this Waiver and Release of Liability, and I understand the terms used in it and their legal significance. This Waiver is freely and voluntarily given with the understanding that my right (as well as the right of any minor child for whom I am the parent or legal guardian using the [employer]) to legal recourse against  in connection with the [employer] is knowingly surrendered in return for use of these services.   I attest that I read the consent document to Tina Hardy, gave Ms. Warshawsky the opportunity to ask questions and answered the questions asked (if any). I affirm that Tina Hardy then provided consent for she's participation in this program.     Tina Hardy

## 2020-12-05 ENCOUNTER — Other Ambulatory Visit: Payer: Self-pay

## 2020-12-05 ENCOUNTER — Ambulatory Visit (INDEPENDENT_AMBULATORY_CARE_PROVIDER_SITE_OTHER): Payer: Medicare HMO | Admitting: Nurse Practitioner

## 2020-12-05 ENCOUNTER — Encounter: Payer: Self-pay | Admitting: Nurse Practitioner

## 2020-12-05 VITALS — BP 120/68 | HR 83 | Temp 98.1°F | Wt 152.0 lb

## 2020-12-05 DIAGNOSIS — I6522 Occlusion and stenosis of left carotid artery: Secondary | ICD-10-CM

## 2020-12-05 DIAGNOSIS — I10 Essential (primary) hypertension: Secondary | ICD-10-CM

## 2020-12-05 DIAGNOSIS — F418 Other specified anxiety disorders: Secondary | ICD-10-CM

## 2020-12-05 DIAGNOSIS — E782 Mixed hyperlipidemia: Secondary | ICD-10-CM | POA: Diagnosis not present

## 2020-12-05 DIAGNOSIS — Z8673 Personal history of transient ischemic attack (TIA), and cerebral infarction without residual deficits: Secondary | ICD-10-CM

## 2020-12-05 DIAGNOSIS — R69 Illness, unspecified: Secondary | ICD-10-CM | POA: Diagnosis not present

## 2020-12-05 DIAGNOSIS — F1290 Cannabis use, unspecified, uncomplicated: Secondary | ICD-10-CM

## 2020-12-05 NOTE — Patient Instructions (Signed)
https://www.nhlbi.nih.gov/files/docs/public/heart/dash_brief.pdf">  DASH Eating Plan DASH stands for Dietary Approaches to Stop Hypertension. The DASH eating plan is a healthy eating plan that has been shown to: Reduce high blood pressure (hypertension). Reduce your risk for type 2 diabetes, heart disease, and stroke. Help with weight loss. What are tips for following this plan? Reading food labels Check food labels for the amount of salt (sodium) per serving. Choose foods with less than 5 percent of the Daily Value of sodium. Generally, foods with less than 300 milligrams (mg) of sodium per serving fit into this eating plan. To find whole grains, look for the word "whole" as the first word in the ingredient list. Shopping Buy products labeled as "low-sodium" or "no salt added." Buy fresh foods. Avoid canned foods and pre-made or frozen meals. Cooking Avoid adding salt when cooking. Use salt-free seasonings or herbs instead of table salt or sea salt. Check with your health care provider or pharmacist before using salt substitutes. Do not fry foods. Cook foods using healthy methods such as baking, boiling, grilling, roasting, and broiling instead. Cook with heart-healthy oils, such as olive, canola, avocado, soybean, or sunflower oil. Meal planning  Eat a balanced diet that includes: 4 or more servings of fruits and 4 or more servings of vegetables each day. Try to fill one-half of your plate with fruits and vegetables. 6-8 servings of whole grains each day. Less than 6 oz (170 g) of lean meat, poultry, or fish each day. A 3-oz (85-g) serving of meat is about the same size as a deck of cards. One egg equals 1 oz (28 g). 2-3 servings of low-fat dairy each day. One serving is 1 cup (237 mL). 1 serving of nuts, seeds, or beans 5 times each week. 2-3 servings of heart-healthy fats. Healthy fats called omega-3 fatty acids are found in foods such as walnuts, flaxseeds, fortified milks, and eggs.  These fats are also found in cold-water fish, such as sardines, salmon, and mackerel. Limit how much you eat of: Canned or prepackaged foods. Food that is high in trans fat, such as some fried foods. Food that is high in saturated fat, such as fatty meat. Desserts and other sweets, sugary drinks, and other foods with added sugar. Full-fat dairy products. Do not salt foods before eating. Do not eat more than 4 egg yolks a week. Try to eat at least 2 vegetarian meals a week. Eat more home-cooked food and less restaurant, buffet, and fast food.  Lifestyle When eating at a restaurant, ask that your food be prepared with less salt or no salt, if possible. If you drink alcohol: Limit how much you use to: 0-1 drink a day for women who are not pregnant. 0-2 drinks a day for men. Be aware of how much alcohol is in your drink. In the [place] one drink equals one 12 oz bottle of beer (355 mL), one 5 oz glass of wine (148 mL), or one 1 oz glass of hard liquor (44 mL). General information Avoid eating more than 2,300 mg of salt a day. If you have hypertension, you may need to reduce your sodium intake to 1,500 mg a day. Work with your health care provider to maintain a healthy body weight or to lose weight. Ask what an ideal weight is for you. Get at least 30 minutes of exercise that causes your heart to beat faster (aerobic exercise) most days of the week. Activities may include walking, swimming, or biking. Work with your health care provider   or dietitian to adjust your eating plan to your individual calorie needs. What foods should I eat? Fruits All fresh, dried, or frozen fruit. Canned fruit in natural juice (without addedsugar). Vegetables Fresh or frozen vegetables (raw, steamed, roasted, or grilled). Low-sodium or reduced-sodium tomato and vegetable juice. Low-sodium or reduced-sodium tomatosauce and tomato paste. Low-sodium or reduced-sodium canned vegetables. Grains Whole-grain or  whole-wheat bread. Whole-grain or whole-wheat pasta. Brown rice. Oatmeal. Quinoa. Bulgur. Whole-grain and low-sodium cereals. Pita bread.Low-fat, low-sodium crackers. Whole-wheat flour tortillas. Meats and other proteins Skinless chicken or turkey. Ground chicken or turkey. Pork with fat trimmed off. Fish and seafood. Egg whites. Dried beans, peas, or lentils. Unsalted nuts, nut butters, and seeds. Unsalted canned beans. Lean cuts of beef with fat trimmed off. Low-sodium, lean precooked or cured meat, such as sausages or meatloaves. Dairy Low-fat (1%) or fat-free (skim) milk. Reduced-fat, low-fat, or fat-free cheeses. Nonfat, low-sodium ricotta or cottage cheese. Low-fat or nonfatyogurt. Low-fat, low-sodium cheese. Fats and oils Soft margarine without trans fats. Vegetable oil. Reduced-fat, low-fat, or light mayonnaise and salad dressings (reduced-sodium). Canola, safflower, olive, avocado, soybean, andsunflower oils. Avocado. Seasonings and condiments Herbs. Spices. Seasoning mixes without salt. Other foods Unsalted popcorn and pretzels. Fat-free sweets. The items listed above may not be a complete list of foods and beverages you can eat. Contact a dietitian for more information. What foods should I avoid? Fruits Canned fruit in a light or heavy syrup. Fried fruit. Fruit in cream or buttersauce. Vegetables Creamed or fried vegetables. Vegetables in a cheese sauce. Regular canned vegetables (not low-sodium or reduced-sodium). Regular canned tomato sauce and paste (not low-sodium or reduced-sodium). Regular tomato and vegetable juice(not low-sodium or reduced-sodium). Pickles. Olives. Grains Baked goods made with fat, such as croissants, muffins, or some breads. Drypasta or rice meal packs. Meats and other proteins Fatty cuts of meat. Ribs. Fried meat. Bacon. Bologna, salami, and other precooked or cured meats, such as sausages or meat loaves. Fat from the back of a pig (fatback). Bratwurst.  Salted nuts and seeds. Canned beans with added salt. Canned orsmoked fish. Whole eggs or egg yolks. Chicken or turkey with skin. Dairy Whole or 2% milk, cream, and half-and-half. Whole or full-fat cream cheese. Whole-fat or sweetened yogurt. Full-fat cheese. Nondairy creamers. Whippedtoppings. Processed cheese and cheese spreads. Fats and oils Butter. Stick margarine. Lard. Shortening. Ghee. Bacon fat. Tropical oils, suchas coconut, palm kernel, or palm oil. Seasonings and condiments Onion salt, garlic salt, seasoned salt, table salt, and sea salt. Worcestershire sauce. Tartar sauce. Barbecue sauce. Teriyaki sauce. Soy sauce, including reduced-sodium. Steak sauce. Canned and packaged gravies. Fish sauce. Oyster sauce. Cocktail sauce. Store-bought horseradish. Ketchup. Mustard. Meat flavorings and tenderizers. Bouillon cubes. Hot sauces. Pre-made or packaged marinades. Pre-made or packaged taco seasonings. Relishes. Regular saladdressings. Other foods Salted popcorn and pretzels. The items listed above may not be a complete list of foods and beverages you should avoid. Contact a dietitian for more information. Where to find more information National Heart, Lung, and Blood Institute: www.nhlbi.nih.gov American Heart Association: www.heart.org Academy of Nutrition and Dietetics: www.eatright.org National Kidney Foundation: www.kidney.org Summary The DASH eating plan is a healthy eating plan that has been shown to reduce high blood pressure (hypertension). It may also reduce your risk for type 2 diabetes, heart disease, and stroke. When on the DASH eating plan, aim to eat more fresh fruits and vegetables, whole grains, lean proteins, low-fat dairy, and heart-healthy fats. With the DASH eating plan, you should limit salt (sodium) intake to 2,300   mg a day. If you have hypertension, you may need to reduce your sodium intake to 1,500 mg a day. Work with your health care provider or dietitian to adjust  your eating plan to your individual calorie needs. This information is not intended to replace advice given to you by your health care provider. Make sure you discuss any questions you have with your healthcare provider. Document Revised: 04/10/2019 Document Reviewed: 04/10/2019 Elsevier Patient Education  2022 Elsevier Inc.  

## 2020-12-05 NOTE — Assessment & Plan Note (Signed)
History of old infarct.  Highly recommend she continue her statin and ASA daily. 

## 2020-12-05 NOTE — Assessment & Plan Note (Signed)
Ongoing, continue collaboration with vascular + current antiplatelet regimen.

## 2020-12-05 NOTE — Assessment & Plan Note (Signed)
Ongoing and chronic. Denies SI/HI.  Will work on slow reduction off Celexa and Buspar at home, wrote down instructions for this at last visit, see if tolerates as she wishes to minimize medications.  Would avoid benzo use due to age and risk for chronic use.  Does not wish to attend psychiatry.  Return in 6 months.

## 2020-12-05 NOTE — Progress Notes (Signed)
BP 120/68 (BP Location: Left Arm)   Pulse 83   Temp 98.1 F (36.7 C) (Oral)   Wt 152 lb (68.9 kg)   SpO2 96%   BMI 26.50 kg/m    Subjective:    Patient ID: Tina Hardy, female    DOB: 1941-05-21, 80 y.o.   MRN: [id number]  HPI: Tina Hardy is a 80 y.o. female  Chief Complaint  Patient presents with   Medication Problem    Patient states she is here to discuss about stopping taking some of her medications.    Heartburn    Patient states she is has been experiencing some heartburn and she states she ate some mustard and it helps her.   GERD Over past months and takes mustard which helps it go away. GERD control status: stable Satisfied with current treatment? yes Heartburn frequency: 2-3 times a week Previous GERD medications: mustard Antacid use frequency: none Nature: heart burn Location: epigastric Heartburn duration: 30 minutes Alleviatiating factors: certain foods Aggravating factors: mustard Dysphagia: no Odynophagia:  no Hematemesis: no Blood in stool: no EGD: no   ANXIETY/STRESS Does smoke occasional MJ to help with this at times.  Takes every now and then.  Reports wishes to come off Celexa and Buspar as feels it has made her lose her mojo.  In past had taken benzo, which she reports helped.  Discussed at length with her these are not recommended in her age group. Duration: stable Anxious mood: yes  Excessive worrying: none Irritability: no  Sweating: no Nausea: no Palpitations:no Hyperventilation: no Panic attacks: none Agoraphobia: no  Obscessions/compulsions: no Depressed mood: no Depression screen [facility] 2/9 12/05/2020 11/04/2020 11/04/2020 11/04/2020 07/05/2020  Decreased Interest 0 0 0 0 0  Down, Depressed, Hopeless 0 0 0 0 0  PHQ - 2 Score 0 0 0 0 0  Altered sleeping 0 - 0 0 0  Tired, decreased energy 0 - 0 0 0  Change in appetite 0 - 0 0 0  Feeling bad or failure about yourself  0 - 0 0 0  Trouble concentrating 0 - 0 0 0  Moving  slowly or fidgety/restless 0 - 0 0 0  Suicidal thoughts 0 - 0 0 0  PHQ-9 Score 0 - 0 0 0  Difficult doing work/chores Not difficult at all - Not difficult at all Not difficult at all Not difficult at all  Some recent data might be hidden   Anhedonia: no Weight changes: no Insomnia: none Hypersomnia: no Fatigue/loss of energy: no Feelings of worthlessness: no Feelings of guilt: no Impaired concentration/indecisiveness: none Suicidal ideations: no  Crying spells: no Recent Stressors/Life Changes: yes   Relationship problems: no   Family stress: none   Financial stress: no    Job stress: no    Recent death/loss: no  GAD 7 : Generalized Anxiety Score 12/05/2020 11/04/2020 08/23/2020 07/05/2020  Nervous, Anxious, on Edge 0 0 0 0  Control/stop worrying 0 0 0 0  Worry too much - different things 0 0 0 0  Trouble relaxing 0 0 0 0  Restless 0 0 0 0  Easily annoyed or irritable 0 0 0 0  Afraid - awful might happen 0 0 0 0  Total GAD 7 Score 0 0 0 0  Anxiety Difficulty Not difficult at all - - Not difficult at all   HYPERLIPIDEMIA Continues on Rosuvastatin and Plavix + Amlodipine for HTN and ASA due to past CVA. Hyperlipidemia status: good compliance Satisfied with current treatment?  yes Side effects:  no Medication compliance: good compliance Past cholesterol meds: none Supplements: none Aspirin:  yes The 10-year ASCVD risk score Mikey Bussing DC Jr., et al., 2013) is: 24.7%   Values used to calculate the score:     Age: 31 years     Sex: Female     Is Non-Hispanic African American: No     Diabetic: No     Tobacco smoker: No     Systolic Blood Pressure: 384 mmHg     Is BP treated: Yes     HDL Cholesterol: 45 mg/dL     Total Cholesterol: 186 mg/dL Chest pain:  no Coronary artery disease:  no Family history CAD:  no Family history early CAD:  no   Relevant past medical, surgical, family and social history reviewed and updated as indicated. Interim medical history since our last  visit reviewed. Allergies and medications reviewed and updated.  Review of Systems  Constitutional:  Negative for activity change, appetite change, diaphoresis, fatigue and fever.  Respiratory:  Negative for cough, chest tightness and shortness of breath.   Cardiovascular:  Negative for chest pain, palpitations and leg swelling.  Gastrointestinal: Negative.   Endocrine: Negative.   Neurological: Negative.   Psychiatric/Behavioral: Negative.     Per HPI unless specifically indicated above     Objective:    BP 120/68 (BP Location: Left Arm)   Pulse 83   Temp 98.1 F (36.7 C) (Oral)   Wt 152 lb (68.9 kg)   SpO2 96%   BMI 26.50 kg/m   Wt Readings from Last 3 Encounters:  12/05/20 152 lb (68.9 kg)  11/04/20 152 lb (68.9 kg)  11/04/20 152 lb 12.8 oz (69.3 kg)    Physical Exam Vitals and nursing note reviewed.  Constitutional:      General: She is awake. She is not in acute distress.    Appearance: She is well-developed and well-groomed. She is not ill-appearing.  HENT:     Head: Normocephalic and atraumatic. Hair is normal.     Right Ear: Hearing normal.     Left Ear: Hearing normal.  Eyes:     General: Lids are normal.     Extraocular Movements: Extraocular movements intact.     Pupils: Pupils are equal, round, and reactive to light.  Neck:     Thyroid: No thyromegaly.     Vascular: No carotid bruit or JVD.  Cardiovascular:     Rate and Rhythm: Normal rate and regular rhythm.     Heart sounds: Normal heart sounds. No murmur heard.   No gallop.  Pulmonary:     Effort: Pulmonary effort is normal.     Breath sounds: Normal breath sounds.  Abdominal:     General: Bowel sounds are normal.     Palpations: Abdomen is soft. There is no hepatomegaly or splenomegaly.  Musculoskeletal:     Cervical back: Normal range of motion and neck supple.     Right lower leg: No edema.     Left lower leg: No edema.  Lymphadenopathy:     Cervical: No cervical adenopathy.  Skin:     General: Skin is warm and dry.  Neurological:     Mental Status: She is alert and oriented to person, place, and time.  Psychiatric:        Attention and Perception: Attention normal.        Mood and Affect: Mood normal.        Behavior: Behavior normal. Behavior is cooperative.  Thought Content: Thought content normal.        Judgment: Judgment normal.   Results for orders placed or performed in visit on 07/05/20  Lipid Panel w/o Chol/HDL Ratio  Result Value Ref Range   Cholesterol, Total 186 100 - 199 mg/dL   Triglycerides 215 (H) 0 - 149 mg/dL   HDL 45 >39 mg/dL   VLDL Cholesterol Cal 37 5 - 40 mg/dL   LDL Chol Calc (NIH) 104 (H) 0 - 99 mg/dL  Basic metabolic panel  Result Value Ref Range   Glucose 97 65 - 99 mg/dL   BUN 13 8 - 27 mg/dL   Creatinine, Ser 0.70 0.57 - 1.00 mg/dL   GFR calc non Af Amer 83 >59 mL/min/1.73   GFR calc Af Amer 96 >59 mL/min/1.73   BUN/Creatinine Ratio 19 12 - 28   Sodium 140 134 - 144 mmol/L   Potassium 4.3 3.5 - 5.2 mmol/L   Chloride 103 96 - 106 mmol/L   CO2 22 20 - 29 mmol/L   Calcium 9.6 8.7 - 10.3 mg/dL  HgB A1c  Result Value Ref Range   Hgb A1c MFr Bld 5.5 4.8 - 5.6 %   Est. average glucose Bld gHb Est-mCnc 111 mg/dL  Hepatitis C antibody  Result Value Ref Range   Hep C Virus Ab <0.1 0.0 - 0.9 s/co ratio  TSH  Result Value Ref Range   TSH 3.460 0.450 - 4.500 uIU/mL      Assessment & Plan:   Problem List Items Addressed This Visit       Cardiovascular and Mediastinum   Hypertension    Chronic, stable with BP at goal on recheck.  Continue current medication regimen and adjust as needed.  Labs next visit.  Recommend checking BP at home regularly + focus on DASH diet.  Refills sent in last visit.  Return in 6 months.       Stenosis of left carotid artery    Ongoing, continue collaboration with vascular + current antiplatelet regimen.         Other   Hyperlipidemia    Chronic, ongoing.  Continue current medication  regimen and adjust as needed.  Lipid panel next visit.  Return in 6 months.       Marijuana use    Reports occasional use.  Recommend cutting back on this.  Continue to recommend no use.       Situational anxiety    Ongoing and chronic. Denies SI/HI.  Will work on slow reduction off Celexa and Buspar at home, wrote down instructions for this at last visit, see if tolerates as she wishes to minimize medications.  Would avoid benzo use due to age and risk for chronic use.  Does not wish to attend psychiatry.  Return in 6 months.       History of cerebellar stroke - Primary    History of old infarct.  Highly recommend she continue her statin and ASA daily.         Follow up plan: Return in about 6 months (around 06/07/2021) for HTN/HLD -- preventative talk, refuses vaccines.

## 2020-12-05 NOTE — Assessment & Plan Note (Signed)
Chronic, stable with BP at goal on recheck.  Continue current medication regimen and adjust as needed.  Labs next visit.  Recommend checking BP at home regularly + focus on DASH diet.  Refills sent in last visit.  Return in 6 months.

## 2020-12-05 NOTE — Assessment & Plan Note (Signed)
Chronic, ongoing.  Continue current medication regimen and adjust as needed.  Lipid panel next visit.  Return in 6 months.

## 2020-12-05 NOTE — Assessment & Plan Note (Signed)
Reports occasional use.  Recommend cutting back on this.  Continue to recommend no use.

## 2020-12-06 ENCOUNTER — Telehealth: Payer: Self-pay | Admitting: General Practice

## 2020-12-06 ENCOUNTER — Telehealth: Payer: Self-pay

## 2020-12-06 NOTE — Telephone Encounter (Signed)
  Care Management   Follow Up Note   12/06/2020 Name: Tina Hardy MRN: [id number] DOB: January 12, 1941   Referred by: Venita Lick, NP Reason for referral : Chronic Care Management ([name]: Follow up for Chronic Disease Management and Care Coordination Needs )   An unsuccessful telephone outreach was attempted today. The patient was referred to the case management team for assistance with care management and care coordination.   Follow Up Plan: A HIPPA compliant phone message was left for the patient providing contact information and requesting a return call.   Noreene Larsson RN, MSN, [facility] Mobile: [phone]

## 2020-12-27 ENCOUNTER — Telehealth: Payer: Medicare HMO | Admitting: General Practice

## 2020-12-27 ENCOUNTER — Ambulatory Visit (INDEPENDENT_AMBULATORY_CARE_PROVIDER_SITE_OTHER): Payer: Medicare HMO | Admitting: General Practice

## 2020-12-27 DIAGNOSIS — F418 Other specified anxiety disorders: Secondary | ICD-10-CM

## 2020-12-27 DIAGNOSIS — I10 Essential (primary) hypertension: Secondary | ICD-10-CM

## 2020-12-27 DIAGNOSIS — F1290 Cannabis use, unspecified, uncomplicated: Secondary | ICD-10-CM

## 2020-12-27 DIAGNOSIS — E782 Mixed hyperlipidemia: Secondary | ICD-10-CM

## 2020-12-27 NOTE — Patient Instructions (Signed)
Visit Information  PATIENT GOALS:  Goals Addressed             This Visit's Progress    [name]: Keep Myself Safe-Substance Misuse       Follow Up Date 03/07/2021    - eat healthy - exercise more - keep emergency contact information on me - learn how to use public transportation - will not use if driving    Why is this important?   Even if you are not ready to quit using alcohol or drugs, there are things you can do to stay safe.  You can stop the spread of HIV (human immunodeficiency virus) by practicing safe sex.  You can stop the spread of hepatitis or HIV by not sharing needles.  Learning CPR (cardiopulmonary resuscitation) and how to call for help may save you or a friend.     Notes: Drinks wine and occasional use of cannabis. Discussed use with the patient . She says she is being safe and denies any issues at this time.      [name]: Stop or Cut Down Using Drugs       Timeframe:  Long-Range Goal Priority:  High Start Date:       12-27-2020                      Expected End Date:      12-27-2021                 Follow Up Date 03/07/2021    - avoid people and places where drugs are used - begin personal counseling - limit how much I use    Why is this important?   To stop using drugs it is important to have support from a person, or group of people, that you can count on.  You will also need to think about the things that make you feel like using drugs; then plan for how to handle them.     Notes: 12-27-2020: Drinks wine and uses cannabis occasionally. Education on the effects of substance abuse. The patient does not see anything wrong with using cannabis or drinking wine. Education and support given. Will continue to monitor.         The patient verbalized understanding of instructions, educational materials, and care plan provided today and declined offer to receive copy of patient instructions, educational materials, and care plan.   Telephone follow up appointment with  care management team member scheduled for: 03-07-2021 at 49 am  Noreene Larsson RN, MSN, [facility] Mobile: [phone]

## 2020-12-27 NOTE — Chronic Care Management (AMB) (Signed)
Chronic Care Management   CCM RN Visit Note  12/27/2020 Name: Tina Hardy MRN: [id number] DOB: 10/10/1940  Subjective: Tina Hardy is a 80 y.o. year old female who is a primary care patient of Cannady, [name] T, NP. The care management team was consulted for assistance with disease management and care coordination needs.    Engaged with patient by telephone for follow up visit in response to provider referral for case management and/or care coordination services.   Consent to Services:  The patient was given information about Chronic Care Management services, agreed to services, and gave verbal consent prior to initiation of services.  Please see initial visit note for detailed documentation.   Patient agreed to services and verbal consent obtained.   Assessment: Review of patient past medical history, allergies, medications, health status, including review of consultants reports, laboratory and other test data, was performed as part of comprehensive evaluation and provision of chronic care management services.   SDOH (Social Determinants of Health) assessments and interventions performed:  SDOH Interventions    Flowsheet Row Most Recent Value  SDOH Interventions   Physical Activity Interventions Other (Comments)  [the patient does not do structured activity, stays in a lot]        CCM Care Plan  Allergies  Allergen Reactions   Codeine Other (See Comments)    Unknown reaction    Penicillins Other (See Comments)    Unknown reaction  Has patient had a PCN reaction causing immediate rash, facial/tongue/throat swelling, SOB or lightheadedness with hypotension: n/a Has patient had a PCN reaction causing severe rash involving mucus membranes or skin necrosis: n/a Has patient had a PCN reaction that required hospitalization: n/a Has patient had a PCN reaction occurring within the last 10 years: n/a If all of the above answers are "NO", then may proceed with Cephalosporin  use.     Outpatient Encounter Medications as of 12/27/2020  Medication Sig   amLODipine (NORVASC) 5 MG tablet Take 1 tablet (5 mg total) by mouth daily.   aspirin EC 81 MG EC tablet Take 1 tablet (81 mg total) by mouth daily.   clopidogrel (PLAVIX) 75 MG tablet Take 1 tablet (75 mg total) by mouth daily at 6 (six) AM.   rosuvastatin (CRESTOR) 10 MG tablet Take 1 tablet (10 mg total) by mouth daily.   No facility-administered encounter medications on file as of 12/27/2020.    Patient Active Problem List   Diagnosis Date Noted   DDD (degenerative disc disease), lumbar 06/18/2020   Aortic atherosclerosis (HCC) 06/18/2020   History of cerebellar stroke 06/18/2020   Elevated hemoglobin A1c 05/26/2019   Aortic valve sclerosis 04/24/2019   Situational anxiety 04/24/2019   Stenosis of left carotid artery 03/23/2019   Insomnia 03/23/2019   Marijuana use 03/23/2019   Hypertension 03/22/2019   Hyperlipidemia 03/22/2019   Ludwig's angina 07/04/2017   Hepatic hemangioma 07/16/2012   Diverticulosis of colon 06/22/2008    Conditions to be addressed/monitored:HTN, HLD, Anxiety, and substance use  Care Plan : [name]: Cannibus Substance Misuse (Adult)  Updates made by Tate, Pamela J since 12/27/2020 12:00 AM     Problem: [name]: Cannabis use/Addictive Behavior   Priority: Medium     Long-Range Goal: [name]: Addictive Behavior Managed   Start Date: 07/26/2020  Expected End Date: 07/26/2021  This Visit's Progress: On track  Priority: Medium  Note:   Current Barriers:  Knowledge Deficits related to use of cannabis  Lacks caregiver support.  Financial Constraints.  Unable to independently   control use of cannabis  Does not adhere to provider recommendations re: refraining from cannabis  Lacks social connections Does not contact provider office for questions/concerns  Nurse Case Manager Clinical Goal(s):   patient will verbalize understanding of plan for stopping the use of cannabis  patient will  attend all scheduled medical appointments: 01-02-2021 patient will demonstrate improved adherence to prescribed treatment plan for cessation of cannabis use as evidenced byalternative method of dealing with anxiety.  Interventions:  1:1 collaboration with Cannady, [name] T, NP regarding development and update of comprehensive plan of care as evidenced by provider attestation and co-signature Inter-disciplinary care team collaboration (see longitudinal plan of care) Evaluation of current treatment plan related to anxiety and use of cannabis for relief and patient's adherence to plan as established by provider.10-04-2020: The patient is taking celexa and buspar for anxiety. She states these have been helpful in reducing her anxiety. She does not like to talk openly about her cannabis use. The patient states that she uses cannabis every now and then. She says that helps her because she does not like to take a pill. The patient feels her anxiety is controlled at this time. 12-27-2020: The patient continues to use cannabis at times. She says it is every now and then. She feels that cannabis is very helpful to her and her conditions. She is not interested in quitting at this time. Gets defensive at times when discussing cannabis use. She does enjoy red wine occasionally but does not drink alone. States she does not drink everyday but will have a glass of wine at times.  Advised patient to talk to pcp about alternatives to anxiety management and not using cannabis  Provided education to patient re: the effects of cannabis  Collaborated with pcp regarding the patient stated she has a hard time managing her anxiety unless she uses cannabis. Out right now and said that she is open to taking medication for anxiety. Education on discussing options with pcp. 07-26-2020: Saw the pcp on 07-05-2020 and 07-11-2020 for situational anxiety. The patient is on a regimen now and states she is feeling better. She is saddened about world  events and was reading her Bible at the time of the call. Empathetic listening and support given. 8-9--2022: The patient saw the pcp on 12-05-2020. The patient is stable at this time. Will continue to monitor and follow up. Next pcp visit  Discussed plans with patient for ongoing care management follow up and provided patient with direct contact information for care management team Assisted the patient in insurance questions, provided coverage details from the chart and provided numbers for the patient to call to obtain a new insurance card. The patient states during her "panic attack", she misplaced her cards and other important documents. Empathetic listening and support.  Reviewed scheduled/upcoming provider appointments including: 06-08-2021  Patient Goals/Self-Care Activities Over the next 120 days, patient will:  - Patient will attend all scheduled provider appointments Patient will call provider office for new concerns or questions Patient will work with [name] to address care coordination needs and will continue to work with the clinical team to address health care and disease management related needs.   - brief intervention provided - decision-making supported - family stress acknowledged - positive reinforcement provided - problem-solving facilitated - resources required to manage barriers identified - safety net resources provided - verbalization of feelings encouraged  Follow Up Plan: Telephone follow up appointment with care management team member scheduled for: 03-07-2021 at 1030am          Task: [name]: Alleviate Barriers to Substance Misuse Treatment Completed 12/27/2020  Outcome: Positive  Note:   Care Management Activities:    - brief intervention provided - decision-making supported - family stress acknowledged - positive reinforcement provided - problem-solving facilitated - resources required to manage barriers identified - safety net resources provided - verbalization  of feelings encouraged        Care Plan : [name]: Hypertension (Adult)  Updates made by Vanita Ingles since 12/27/2020 12:00 AM     Problem: [name]: Hypertension (Hypertension)   Priority: Medium     Long-Range Goal: [name]: Hypertension Monitored   Start Date: 07/26/2020  Expected End Date: 07/26/2021  This Visit's Progress: On track  Priority: Medium  Note:   Objective:  Last practice recorded BP readings:  BP Readings from Last 3 Encounters:  12/05/20 120/68  11/04/20 118/76  08/23/20 117/73      Most recent eGFR/CrCl: No results found for: EGFR  No components found for: CRCL Current Barriers:  Knowledge Deficits related to basic understanding of hypertension pathophysiology and self care management Knowledge Deficits related to understanding of medications prescribed for management of hypertension Transportation barriers Limited Environmental consultant.  Unable to independently manage HTN and chronic conditions Lacks social connections Does not contact provider office for questions/concerns Case Manager Clinical Goal(s):   patient will verbalize understanding of plan for hypertension management  patient will attend all scheduled medical appointments: pcp on 01-02-2021  patient will demonstrate improved adherence to prescribed treatment plan for hypertension as evidenced by taking all medications as prescribed, monitoring and recording blood pressure as directed, adhering to low sodium/DASH diet patient will demonstrate improved health management independence as evidenced by checking blood pressure as directed and notifying PCP if SBP>150 or DBP > 90, taking all medications as prescribe, and adhering to a low sodium diet as discussed. Interventions:  UNABLE to independently:mange HTN Evaluation of current treatment plan related to hypertension self management and patient's adherence to plan as established by provider. 12-27-2020: The patient has good control of her HTN at  this time. States she is eating okay. Denies any issues with her blood pressure. States she feels her heart health is good at this time.  Provided education to patient re: stroke prevention, s/s of heart attack and stroke, DASH diet, complications of uncontrolled blood pressure. 12-27-2020: Review of eating habits and the patient verbalized she is watching what she eats and doing well. Denies any new concerns. Reviewed medications with patient and discussed importance of compliance. 07-26-2020: States compliance with medications regimen. Has not gotten biotin for hair loss yet due to cost constraints but states she will when she gets some money. It is no worse that what it was when she was in the office. 12-27-2020: States compliance with her medications at this time.  Discussed plans with patient for ongoing care management follow up and provided patient with direct contact information for care management team Advised patient, providing education and rationale, to monitor blood pressure daily and record, calling PCP for findings outside established parameters. The patient is currently not taking blood pressures at home and does not feel like she needs to do so.  Reviewed scheduled/upcoming provider appointments including: 06-08-2021 Patient Goals/Self-Care Activities  patient will:  - UNABLE to independently manage HTN Attends all scheduled provider appointments Calls provider office for new concerns, questions, or BP outside discussed parameters Checks BP and records as discussed- does not check blood pressures at home Follows a low sodium diet/DASH diet -  depression screen reviewed - home or ambulatory blood pressure monitoring encouraged Follow Up Plan: Telephone follow up appointment with care management team member scheduled for: 03-07-2021 at 1030 am    Task: [name]: Identify and Monitor Blood Pressure Elevation Completed 12/27/2020  Outcome: Positive  Note:   Care Management Activities:    -  depression screen reviewed - home or ambulatory blood pressure monitoring encouraged    Notes: Does not take blood pressures at home    Care Plan : [name]: Anxiety  Updates made by Tate, Pamela J since 12/27/2020 12:00 AM     Problem: [name]: Axniety and patient managing with Cannabis   Priority: Medium     Long-Range Goal: [name]: Anxiety and Cannabis Drug Use Managed   Start Date: 07/26/2020  Expected End Date: 07/26/2021  This Visit's Progress: On track  Priority: Medium  Note:   Current Barriers:  Knowledge Deficits related to use of Cannabis for control of anxiety Chronic Disease Management support and education needs related to anxiety Lacks caregiver support.  Financial Constraints.  Non-adherence to prescribed medication regimen Unable to independently manage anxiety Does not adhere to provider recommendations re: refrain from the use of cannabis to help anxiety Does not contact provider office for questions/concerns  Nurse Case Manager Clinical Goal(s):  patient will verbalize understanding of plan for management of anxiety without the use of cannabis   patient will work with [name], CCM team and pcp to address needs related to anxiety and how to effectively manage with alternative methods other than substance abuse.  patient will demonstrate a decrease in anxiety exacerbations as evidenced by controlled anxiety patient will attend all scheduled medical appointments: appointment with the pcp on 06-08-2021  Interventions:  1:1 collaboration with Cannady, [name] T, NP regarding development and update of comprehensive plan of care as evidenced by provider attestation and co-signature Inter-disciplinary care team collaboration (see longitudinal plan of care) Provided education to patient re: alternative methods for dealing with anxiety. 07-26-2020: The patient saw the pcp for situational anxiety on 07-05-2020 and 07-11-2020. She is now taking celexa and Buspar and states this is doing well  for her. 10-04-2020: The patient states she is doing well with managing her anxiety at this time. Did have a panic attack recently and misplaced her insurance cards and ID. Was a little stressed about this but had numbers to call and information provided to her by [name] to assist with calling her insurance companies to get new insurance cards. Care Guide referral also placed for help with finding a dentist in [place]. 12-27-2020: The patient denies any issues with her anxiety at this time. She was reading her Bible and listening to her tablet at the time of the call. She states she has no anxiety right now and feels great. Is not sleeping the best but states as you get older you don't need as much sleep. Still using cannabis to help with anxiety. The patient feels she is managing her health well at this time.  Discussed plans with patient for ongoing care management follow up and provided patient with direct contact information for care management team Provided patient with mindfullness educational materials related to helping decrease anxiety. The patient states her son is researching information from a Dr. Gundry about how to reduce Lithium in the diet and wanted the pcp to know. 12-27-2020: The patient ask about breast exam and pelvic exams. Explained to the patient this is usually performed at OB/GYN unless it has been discussed with the   pcp. The patient denies any new concerns but states she wanted to ask about it. Recommended she discuss with the pcp. Reviewed scheduled/upcoming provider appointments including: 06-08-2021  Patient Goals/Self-Care Activities  patient will:  - Patient will self administer medications as prescribed Patient will attend all scheduled provider appointments Patient will call pharmacy for medication refills Patient will call provider office for new concerns or questions Patient will work with [name] to address care coordination needs and will continue to work with the  clinical team to address health care and disease management related needs.   - barriers to change identified - brief intervention provided - counseling provided - stage of change monitored - substance misuse monitored *Uses cannabis regularly     Follow Up Plan: Telephone follow up appointment with care management team member scheduled for: 03-07-2021 at 44 am        Task: [name]: Identify and Promote Change to Drug Misuse Completed 12/27/2020  Outcome: Positive  Note:   Care Management Activities:    - barriers to change identified - brief intervention provided - counseling provided - stage of change monitored - substance misuse monitored    Notes: uses cannabis regularly but says she is out right now.      Care Plan : [name]: HLD Management  Updates made by Vanita Ingles since 12/27/2020 12:00 AM     Problem: [name]: Health Promotion or Disease Self-Management (General Plan of Care)   Priority: High     Long-Range Goal: [name]: HLD Self-Management Plan Developed   Start Date: 07/26/2020  Expected End Date: 09/21/2021  This Visit's Progress: On track  Priority: Medium  Note:   Current Barriers:  Poorly controlled hyperlipidemia, complicated by HTN, non-compliance and anxiety Current antihyperlipidemic regimen: Lipitor 48m QD- changed to Rosuvastatin 10 mg daily in February of 2022 Most recent lipid panel:  Lab Results  Component Value Date   CHOL 186 07/05/2020   CHOL 142 03/29/2020   CHOL 158 09/28/2019   Lab Results  Component Value Date   HDL 45 07/05/2020   HDL 41 03/29/2020   HDL 46 09/28/2019   Lab Results  Component Value Date   LDLCALC 104 (H) 07/05/2020   LDLCALC 70 03/29/2020   LDLCALC 66 09/28/2019   Lab Results  Component Value Date   TRIG 215 (H) 07/05/2020   TRIG 182 (H) 03/29/2020   TRIG 293 (H) 09/28/2019   Lab Results  Component Value Date   CHOLHDL 5.2 02/16/2019   No results found for: LDLDIRECT  ASCVD risk enhancing conditions: age  >>41 pre-DM, HTN, Cannabis use, former smoker Unable to independently manage HLD Does not adhere to provider recommendations re: medications at times and heart healthy diet  Does not contact provider office for questions/concerns  RN Care Manager Clinical Goal(s):   patient will work with [facility] [place] providers, and care team towards execution of optimized self-health management plan  patient will verbalize understanding of plan for HLD management  patient will attend all scheduled medical appointments: 1-19-2023with pcp  patient will demonstrate improved adherence to prescribed treatment plan for HLD as evidenced by keeping appointments, compliance with the plan of care and calling for changes   Interventions: Medication review performed; medication list updated in electronic medical record.  Inter-disciplinary care team collaboration (see longitudinal plan of care) Referred to pharmacy team for assistance with HLD medication management Evaluation of current treatment plan related to HLD and patient's adherence to plan as established by provider. 12-27-2020: The patient denies  any issues with her HLD management. States she is doing well. States at this time she is eating good and not adding sodium to her food. Will continue to monitor for changes.  Advised patient to call the provider for changes in condition or questions about health and wellness.  Provided education to patient re: benefits of following a heart healthy diet and checking blood pressures on a regular basis Reviewed medications with patient and discussed compliance. 12-27-2020: Confirms compliance with medications regimen Discussed plans with patient for ongoing care management follow up and provided patient with direct contact information for care management team Reviewed scheduled/upcoming provider appointments including: 06-08-2021 Education on active life style and benefits of staying active for health and well being.  8-9--2022: The patient states she is enjoying the warmer weather and being active. She denies any acute distress. States she is following the recommendations of the provider. Does not go outside when it is very hot, does go out and enjoys the weather if it is not too hot. Denies any acute distress at this time. Will continue to monitor. Does not do structured activity. Once went to the gym but does not go now.  Patient Goals/Self-Care Activities:  patient will:   - call for medicine refill 2 or 3 days before it runs out - call if I am sick and can't take my medicine - keep a list of all the medicines I take; vitamins and herbals too - learn to read medicine labels - use a pillbox to sort medicine - use an alarm clock or phone to remind me to take my medicine - drink 6 to 8 glasses of water each day - eat 3 to 5 servings of fruits and vegetables each day - eat 5 or 6 small meals each day - fill half the plate with nonstarchy vegetables - limit fast food meals to no more than 1 per week - manage portion size - prepare main meal at home 3 to 5 days each week - read food labels for fat, fiber, carbohydrates and portion size - set a realistic goal - switch to sugar-free drinks - be open to making changes - I can manage, know and watch for signs of a heart attack - if I have chest pain, call for help - learn about small changes that will make a big difference - learn my personal risk factors  - barriers to meeting goals identified - change-talk evoked - choices provided - collaboration with team encouraged - decision-making supported - difficulty of making life-long changes acknowledged - health risks reviewed - problem-solving facilitated - questions answered - readiness for change evaluated - reassurance provided - resources needed to meet goals identified - self-reflection promoted - self-reliance encouraged - verbalization of feelings encouraged  Follow Up Plan: Telephone  follow up appointment with care management team member scheduled for: 03-07-2021 at 1030 am    Task: [name]: HLD: Mutually Develop and Foster Achievement of Patient Goals Completed 12/27/2020  Outcome: Positive  Note:   Care Management Activities:    - barriers to meeting goals identified - change-talk evoked - choices provided - collaboration with team encouraged - decision-making supported - difficulty of making life-long changes acknowledged - health risks reviewed - problem-solving facilitated - questions answered - readiness for change evaluated - reassurance provided - resources needed to meet goals identified - self-reflection promoted - self-reliance encouraged - verbalization of feelings encouraged         Plan:Telephone follow up appointment with care management team   member scheduled for:  03-07-2021 at 72 am  Noreene Larsson RN, MSN, [facility] Mobile: [phone]

## 2021-01-02 ENCOUNTER — Ambulatory Visit: Payer: Medicare Other | Admitting: Nurse Practitioner

## 2021-01-04 ENCOUNTER — Telehealth: Payer: Self-pay

## 2021-01-16 ENCOUNTER — Ambulatory Visit: Payer: Medicaid Other

## 2021-01-16 DIAGNOSIS — E782 Mixed hyperlipidemia: Secondary | ICD-10-CM

## 2021-01-16 DIAGNOSIS — I10 Essential (primary) hypertension: Secondary | ICD-10-CM | POA: Diagnosis not present

## 2021-01-16 NOTE — Patient Instructions (Signed)
Ms. Gunsolus,  Thank you for talking with me today. I have included our care plan/goals in the following pages.   Please review and call me at [phone] with any questions.  Thanks! Ellin Mayhew, PharmD, CPP Clinical Pharmacist Practitioner  [phone]  Care Plan : ccm pharmacy care plan  Updates made by Madelin Rear, [facility] since 01/16/2021 12:00 AM     Problem: hld htn insomnia   Priority: High     Long-Range Goal: disease management   Start Date: 01/16/2021  Expected End Date: 01/16/2022  This Visit's Progress: On track  Priority: High  Note:    Current Barriers:  Unable to maintain control of HLD  Pharmacist Clinical Goal(s):  Patient will contact provider office for questions/concerns as evidenced notation of same in electronic health record through collaboration with PharmD and provider.   Interventions: 1:1 collaboration with Venita Lick, NP regarding development and update of comprehensive plan of care as evidenced by provider attestation and co-signature Inter-disciplinary care team collaboration (see longitudinal plan of care) Comprehensive medication review performed; medication list updated in electronic medical record  Hypertension (BP goal <130/80) -Controlled -Current treatment: Amlodipine 5 mg once daily  -Current home readings: none provided -Current exercise habits: would like to go back to the gym  -Denies hypotensive/hypertensive symptoms -Educated on BP goals and benefits of medications for prevention of heart attack, stroke and kidney damage; -Counseled to monitor BP at home 1-2x/wk, document, and provide log at future appointments -Recommended to continue current medication  Hyperlipidemia: (LDL goal < 100) -Not ideally controlled -Atorvastatin 10 mg once daily mentioned as current medication  -Current treatment: Rosuvastatin 10 mg once daily  -Reviewed side effects - no problems noted. Some confusion over atorvastatin and  rosuvastatin but confirmed rosuvastatin is the only medication being used.  -Educated on Cholesterol goals;  -Recommended to continue current medication  Patient Goals/Self-Care Activities Patient will:  - take medications as prescribed  Medication Assistance: None required.  Patient affirms current coverage meets needs. [name] preferred pharmacy is:  [facility], [place] [address] Phone: [phone] Fax: [phone]  Follow Up:  Patient agrees to Care Plan and Follow-up. Plan: HC 3 month adherence f/u. CPP f/u telephone    The patient verbalized understanding of instructions provided today and declined to receive a copy of patient instruction and/or educational materials. Telephone follow up appointment with pharmacy team member scheduled for: See next appointment with "Care Management Staff" under "What's Next" below.

## 2021-01-16 NOTE — Progress Notes (Signed)
Chronic Care Management Pharmacy Note  01/16/2021 Name:  Tina Hardy MRN:  [id number] DOB:  [birth year]  Summary: No changes  Subjective: Tina Hardy is an 80 y.o. year old female who is a primary patient of Cannady, Henrine Screws T, NP.  The CCM team was consulted for assistance with disease management and care coordination needs.    Engaged with patient by telephone for follow up visit in response to provider referral for pharmacy case management and/or care coordination services.   Consent to Services:  The patient was given the following information about Chronic Care Management services today, agreed to services, and gave verbal consent.  Patient Care Team: Venita Lick, NP as PCP - General (Nurse Practitioner) Lafayette Dragon, MD (Inactive) as Consulting Physician (Gastroenterology) Vanita Ingles, RN as Case Manager ([place]) Vladimir Faster, [facility] as Pharmacist (Pharmacist) Rebekah Chesterfield, LCSW as Social Worker (Licensed Clinical Social Worker)  Hospital visits: None in previous 6 months  Objective:  Lab Results  Component Value Date   CREATININE 0.70 07/05/2020   CREATININE 0.67 03/29/2020   CREATININE 0.76 09/28/2019    Lab Results  Component Value Date   HGBA1C 5.5 07/05/2020   HGBA1C 5.9 (H) 03/29/2020   HGBA1C 5.9 (H) 09/28/2019   Last diabetic Eye exam: No results found for: HMDIABEYEEXA  Last diabetic Foot exam: No results found for: HMDIABFOOTEX      Component Value Date/Time   CHOL 186 07/05/2020 1044   CHOL 142 03/29/2020 0923   CHOL 158 09/28/2019 1018   TRIG 215 (H) 07/05/2020 1044   TRIG 182 (H) 03/29/2020 0923   TRIG 293 (H) 09/28/2019 1018   HDL 45 07/05/2020 1044   HDL 41 03/29/2020 0923   HDL 46 09/28/2019 1018   CHOLHDL 5.2 02/16/2019 1945   VLDL 52 (H) 02/16/2019 1945   LDLCALC 104 (H) 07/05/2020 1044   [place] 70 03/29/2020 0923   LDLCALC 66 09/28/2019 1018    Hepatic Function Latest Ref Rng & Units 05/26/2019  02/16/2019 07/04/2017  Total Protein 6.0 - 8.5 g/dL 6.7 6.9 7.9  Albumin 3.7 - 4.7 g/dL 4.3 4.0 3.8  AST 0 - 40 IU/L 21 23 21   ALT 0 - 32 IU/L 24 21 21   Alk Phosphatase 39 - 117 IU/L 88 68 88  Total Bilirubin 0.0 - 1.2 mg/dL 0.4 0.8 0.8  Bilirubin, Direct 0.0 - 0.3 mg/dL - - -    Lab Results  Component Value Date/Time   TSH 3.460 07/05/2020 10:44 AM   TSH 3.340 03/29/2020 09:23 AM    CBC Latest Ref Rng & Units 04/23/2019 02/16/2019 08/23/2017  WBC 4.0 - 10.5 K/uL 8.7 9.3 8.4  Hemoglobin 12.0 - 15.0 g/dL 11.6(L) 14.3 13.6  Hematocrit 36.0 - 46.0 % 34.8(L) 43.7 40.6  Platelets 150 - 400 K/uL 333 394 425.0(H)    No results found for: VD25OH  Clinical ASCVD:  The 10-year ASCVD risk score Mikey Bussing DC Jr., et al., 2013) is: 24.7%   Values used to calculate the score:     Age: 34 years     Sex: Female     Is Non-Hispanic African American: No     Diabetic: No     Tobacco smoker: No     Systolic Blood Pressure: 413 mmHg     Is BP treated: Yes     HDL Cholesterol: 45 mg/dL     Total Cholesterol: 186 mg/dL   Social History   Tobacco Use  Smoking Status Former  Types: Cigarettes   Quit date: 12/04/1972   Years since quitting: 48.1  Smokeless Tobacco Never   BP Readings from Last 3 Encounters:  12/05/20 120/68  11/04/20 118/76  08/23/20 117/73   Pulse Readings from Last 3 Encounters:  12/05/20 83  11/04/20 79  08/23/20 74   Wt Readings from Last 3 Encounters:  12/05/20 152 lb (68.9 kg)  11/04/20 152 lb (68.9 kg)  11/04/20 152 lb 12.8 oz (69.3 kg)   BMI Readings from Last 3 Encounters:  12/05/20 26.50 kg/m  11/04/20 26.50 kg/m  11/04/20 26.61 kg/m    Assessment: Review of patient past medical history, allergies, medications, health status, including review of consultants reports, laboratory and other test data, was performed as part of comprehensive evaluation and provision of chronic care management services.   SDOH:  (Social Determinants of Health) assessments and  interventions performed: Yes   CCM Care Plan  Allergies  Allergen Reactions   Codeine Other (See Comments)    Unknown reaction    Penicillins Other (See Comments)    Unknown reaction  Has patient had a PCN reaction causing immediate rash, facial/tongue/throat swelling, SOB or lightheadedness with hypotension: n/a Has patient had a PCN reaction causing severe rash involving mucus membranes or skin necrosis: n/a Has patient had a PCN reaction that required hospitalization: n/a Has patient had a PCN reaction occurring within the last 10 years: n/a If all of the above answers are "NO", then may proceed with Cephalosporin use.     Medications Reviewed Today     Reviewed by Madelin Rear, [facility] (Pharmacist) on 01/16/21 at 1615  Med List Status: <None>   Medication Order Taking? Sig Documenting Provider Last Dose Status Informant  amLODipine (NORVASC) 5 MG tablet [id number] Yes Take 1 tablet (5 mg total) by mouth daily. Marnee Guarneri T, NP  Active   aspirin EC 81 MG EC tablet [id number] Yes Take 1 tablet (81 mg total) by mouth daily. Stegmayer, Janalyn Harder, PA-C  Active   clopidogrel (PLAVIX) 75 MG tablet [id number] Yes Take 1 tablet (75 mg total) by mouth daily at 6 (six) AM. Marnee Guarneri T, NP  Active   rosuvastatin (CRESTOR) 10 MG tablet [id number] Yes Take 1 tablet (10 mg total) by mouth daily. Marnee Guarneri T, NP  Active             Patient Active Problem List   Diagnosis Date Noted   DDD (degenerative disc disease), lumbar 06/18/2020   Aortic atherosclerosis ([place]) 06/18/2020   History of cerebellar stroke 06/18/2020   Elevated hemoglobin A1c 05/26/2019   Aortic valve sclerosis 04/24/2019   Situational anxiety 04/24/2019   Stenosis of left carotid artery 03/23/2019   Insomnia 03/23/2019   Marijuana use 03/23/2019   Hypertension 03/22/2019   Hyperlipidemia 03/22/2019   Ludwig's angina 07/04/2017   Hepatic hemangioma 07/16/2012   Diverticulosis of colon 06/22/2008    Conditions to be addressed/monitored: HLD HTN Insomnia    Current Barriers:  Unable to maintain control of HLD  Pharmacist Clinical Goal(s):  Patient will contact provider office for questions/concerns as evidenced notation of same in electronic health record through collaboration with PharmD and provider.   Interventions: 1:1 collaboration with Venita Lick, NP regarding development and update of comprehensive plan of care as evidenced by provider attestation and co-signature Inter-disciplinary care team collaboration (see longitudinal plan of care) Comprehensive medication review performed; medication list updated in electronic medical record  Hypertension (BP goal <130/80) -Controlled -Current treatment: Amlodipine 5 mg once  daily  -Current home readings: none provided -Current exercise habits: would like to go back to the gym  -Denies hypotensive/hypertensive symptoms -Educated on BP goals and benefits of medications for prevention of heart attack, stroke and kidney damage; -Counseled to monitor BP at home 1-2x/wk, document, and provide log at future appointments -Recommended to continue current medication  Hyperlipidemia: (LDL goal < 100) -Not ideally controlled -Atorvastatin 10 mg once daily mentioned as current medication  -Current treatment: Rosuvastatin 10 mg once daily  -Reviewed side effects - no problems noted. Some confusion over atorvastatin and rosuvastatin but confirmed rosuvastatin is the only medication being used.  -Educated on Cholesterol goals;  -Recommended to continue current medication  Patient Goals/Self-Care Activities Patient will:  - take medications as prescribed  Medication Assistance: None required.  Patient affirms current coverage meets needs. [name] preferred pharmacy is:  [facility], [place] [address] Phone: [phone] Fax: [phone]  Follow Up:  Patient agrees to  Care Plan and Follow-up. Plan: HC 3 month adherence f/u. CPP f/u telephone  Future Appointments  Date Time Provider [place]  03/07/2021 10:30 AM CFP CCM CASE MANAGER CFP-CFP PEC  06/08/2021  9:20 AM Venita Lick, NP CFP-CFP PEC  11/06/2021  2:30 PM CFP NURSE HEALTH ADVISOR CFP-CFP [place], PharmD, CPP Clinical Pharmacist Practitioner  [place]  [phone]

## 2021-02-03 ENCOUNTER — Telehealth: Payer: Self-pay | Admitting: Nurse Practitioner

## 2021-02-03 NOTE — Telephone Encounter (Signed)
Patient called in is having trouble sleeping. She is asking if Lorrin Mais can be sent in to  [facility], [place]. Phone:  [phone]  Fax:  [phone]

## 2021-02-06 NOTE — Telephone Encounter (Signed)
Patient is not currently on medication list but insomnia is on problem list. Would you like for the patient to come in to discuss first?

## 2021-02-06 NOTE — Telephone Encounter (Signed)
Please call patient and schedule an appointment to discuss starting medication per [name].

## 2021-02-07 ENCOUNTER — Ambulatory Visit (INDEPENDENT_AMBULATORY_CARE_PROVIDER_SITE_OTHER): Payer: Medicare HMO | Admitting: Nurse Practitioner

## 2021-02-07 ENCOUNTER — Encounter: Payer: Self-pay | Admitting: Nurse Practitioner

## 2021-02-07 DIAGNOSIS — G4700 Insomnia, unspecified: Secondary | ICD-10-CM | POA: Diagnosis not present

## 2021-02-07 MED ORDER — TRAZODONE HCL 50 MG PO TABS: 25.0000 mg | ORAL_TABLET | Freq: Every evening | ORAL | 0 refills | Status: DC | PRN

## 2021-02-07 NOTE — Telephone Encounter (Signed)
Pt has a telephone office visit today with Lauren at 11:20 for problems with sleeping. Will this be okay or do you need her to come in?
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
## 2021-02-07 NOTE — Assessment & Plan Note (Addendum)
Chronic, ongoing. She has tried melatonin OTC with minimal relief. Will start trazodone 25mg  qHS prn. Discussed good sleep hygiene techniques. Follow up in 4 weeks or sooner with concerns.

## 2021-02-22 ENCOUNTER — Ambulatory Visit: Payer: Medicare HMO | Admitting: Nurse Practitioner

## 2021-03-07 ENCOUNTER — Telehealth: Payer: Self-pay

## 2021-03-08 ENCOUNTER — Ambulatory Visit (INDEPENDENT_AMBULATORY_CARE_PROVIDER_SITE_OTHER): Payer: Medicaid Other

## 2021-03-08 DIAGNOSIS — F418 Other specified anxiety disorders: Secondary | ICD-10-CM

## 2021-03-08 DIAGNOSIS — F1290 Cannabis use, unspecified, uncomplicated: Secondary | ICD-10-CM

## 2021-03-08 DIAGNOSIS — E782 Mixed hyperlipidemia: Secondary | ICD-10-CM

## 2021-03-08 DIAGNOSIS — G4700 Insomnia, unspecified: Secondary | ICD-10-CM

## 2021-03-08 DIAGNOSIS — I10 Essential (primary) hypertension: Secondary | ICD-10-CM

## 2021-03-08 NOTE — Chronic Care Management (AMB) (Signed)
Chronic Care Management   CCM RN Visit Note  03/08/2021 Name: Tina Hardy MRN: [id number] DOB: Aug 09, 1940  Subjective: Tina Hardy is a 80 y.o. year old female who is a primary care patient of Cannady, Barbaraann Faster, NP. The care management team was consulted for assistance with disease management and care coordination needs.    Engaged with patient by telephone for follow up visit in response to provider referral for case management and/or care coordination services.   Consent to Services:  The patient was given information about Chronic Care Management services, agreed to services, and gave verbal consent prior to initiation of services.  Please see initial visit note for detailed documentation.   Patient agreed to services and verbal consent obtained.   Assessment: Review of patient past medical history, allergies, medications, health status, including review of consultants reports, laboratory and other test data, was performed as part of comprehensive evaluation and provision of chronic care management services.   SDOH (Social Determinants of Health) assessments and interventions performed:    CCM Care Plan  Allergies  Allergen Reactions   Codeine Other (See Comments)    Unknown reaction    Penicillins Other (See Comments)    Unknown reaction  Has patient had a PCN reaction causing immediate rash, facial/tongue/throat swelling, SOB or lightheadedness with hypotension: n/a Has patient had a PCN reaction causing severe rash involving mucus membranes or skin necrosis: n/a Has patient had a PCN reaction that required hospitalization: n/a Has patient had a PCN reaction occurring within the last 10 years: n/a If all of the above answers are "NO", then may proceed with Cephalosporin use.     Outpatient Encounter Medications as of 03/08/2021  Medication Sig   amLODipine (NORVASC) 5 MG tablet Take 1 tablet (5 mg total) by mouth daily.   aspirin EC 81 MG EC tablet Take 1 tablet  (81 mg total) by mouth daily.   clopidogrel (PLAVIX) 75 MG tablet Take 1 tablet (75 mg total) by mouth daily at 6 (six) AM.   rosuvastatin (CRESTOR) 10 MG tablet Take 1 tablet (10 mg total) by mouth daily.   traZODone (DESYREL) 50 MG tablet Take 0.5 tablets (25 mg total) by mouth at bedtime as needed for sleep.   No facility-administered encounter medications on file as of 03/08/2021.    Patient Active Problem List   Diagnosis Date Noted   DDD (degenerative disc disease), lumbar 06/18/2020   Aortic atherosclerosis ([place]) 06/18/2020   History of cerebellar stroke 06/18/2020   Elevated hemoglobin A1c 05/26/2019   Aortic valve sclerosis 04/24/2019   Situational anxiety 04/24/2019   Stenosis of left carotid artery 03/23/2019   Insomnia 03/23/2019   Marijuana use 03/23/2019   Hypertension 03/22/2019   Hyperlipidemia 03/22/2019   Ludwig's angina 07/04/2017   Hepatic hemangioma 07/16/2012   Diverticulosis of colon 06/22/2008    Conditions to be addressed/monitored:HTN, HLD, Anxiety, and cannibals use and insomnia  Care Plan : [name]: Cannibus Substance Misuse (Adult)  Updates made by Vanita Ingles, RN since 03/08/2021 12:00 AM  Completed 03/08/2021   Problem: [name]: Cannabis use/Addictive Behavior Resolved 03/08/2021  Priority: Medium     Long-Range Goal: [name]: Addictive Behavior Managed Completed 03/08/2021  Start Date: 07/26/2020  Expected End Date: 07/26/2021  Recent Progress: On track  Priority: Medium  Note:   Current Barriers: Resolving due to duplicate goal Knowledge Deficits related to use of cannabis  Lacks caregiver support.  Film/video editor.  Unable to independently control use of cannabis  Does not  adhere to provider recommendations re: refraining from cannabis  Lacks social connections Does not contact provider office for questions/concerns  Nurse Case Manager Clinical Goal(s):   patient will verbalize understanding of plan for stopping the use of cannabis   patient will attend all scheduled medical appointments: 01-02-2021 patient will demonstrate improved adherence to prescribed treatment plan for cessation of cannabis use as evidenced byalternative method of dealing with anxiety.  Interventions:  1:1 collaboration with Venita Lick, NP regarding development and update of comprehensive plan of care as evidenced by provider attestation and co-signature Inter-disciplinary care team collaboration (see longitudinal plan of care) Evaluation of current treatment plan related to anxiety and use of cannabis for relief and patient's adherence to plan as established by provider.10-04-2020: The patient is taking celexa and buspar for anxiety. She states these have been helpful in reducing her anxiety. She does not like to talk openly about her cannabis use. The patient states that she uses cannabis every now and then. She says that helps her because she does not like to take a pill. The patient feels her anxiety is controlled at this time. 12-27-2020: The patient continues to use cannabis at times. She says it is every now and then. She feels that cannabis is very helpful to her and her conditions. She is not interested in quitting at this time. Gets defensive at times when discussing cannabis use. She does enjoy red wine occasionally but does not drink alone. States she does not drink everyday but will have a glass of wine at times.  Advised patient to talk to pcp about alternatives to anxiety management and not using cannabis  Provided education to patient re: the effects of cannabis  Collaborated with pcp regarding the patient stated she has a hard time managing her anxiety unless she uses cannabis. Out right now and said that she is open to taking medication for anxiety. Education on discussing options with pcp. 07-26-2020: Saw the pcp on 07-05-2020 and 07-11-2020 for situational anxiety. The patient is on a regimen now and states she is feeling better. She is  saddened about world events and was reading her Bible at the time of the call. Empathetic listening and support given. 8-9--2022: The patient saw the pcp on 12-05-2020. The patient is stable at this time. Will continue to monitor and follow up. Next pcp visit  Discussed plans with patient for ongoing care management follow up and provided patient with direct contact information for care management team Assisted the patient in insurance questions, provided coverage details from the chart and provided numbers for the patient to call to obtain a new insurance card. The patient states during her "panic attack", she misplaced her cards and other important documents. Empathetic listening and support.  Reviewed scheduled/upcoming provider appointments including: 06-08-2021  Patient Goals/Self-Care Activities Over the next 120 days, patient will:  - Patient will attend all scheduled provider appointments Patient will call provider office for new concerns or questions Patient will work with [name] to address care coordination needs and will continue to work with the clinical team to address health care and disease management related needs.   - brief intervention provided - decision-making supported - family stress acknowledged - positive reinforcement provided - problem-solving facilitated - resources required to manage barriers identified - safety net resources provided - verbalization of feelings encouraged  Follow Up Plan: Telephone follow up appointment with care management team member scheduled for: 03-07-2021 at 1030am        Care Plan :  [name]: Hypertension (Adult)  Updates made by Vanita Ingles, RN since 03/08/2021 12:00 AM  Completed 03/08/2021   Problem: [name]: Hypertension (Hypertension) Resolved 03/08/2021  Priority: Medium     Long-Range Goal: [name]: Hypertension Monitored Completed 03/08/2021  Start Date: 07/26/2020  Expected End Date: 07/26/2021  Recent Progress: On track  Priority:  Medium  Note:   Objective: Resolving due to duplicate goal Last practice recorded BP readings:  BP Readings from Last 3 Encounters:  12/05/20 120/68  11/04/20 118/76  08/23/20 117/73      Most recent eGFR/CrCl: No results found for: EGFR  No components found for: CRCL Current Barriers:  Knowledge Deficits related to basic understanding of hypertension pathophysiology and self care management Knowledge Deficits related to understanding of medications prescribed for management of hypertension Transportation barriers Limited Environmental consultant.  Unable to independently manage HTN and chronic conditions Lacks social connections Does not contact provider office for questions/concerns Case Manager Clinical Goal(s):   patient will verbalize understanding of plan for hypertension management  patient will attend all scheduled medical appointments: pcp on 01-02-2021  patient will demonstrate improved adherence to prescribed treatment plan for hypertension as evidenced by taking all medications as prescribed, monitoring and recording blood pressure as directed, adhering to low sodium/DASH diet patient will demonstrate improved health management independence as evidenced by checking blood pressure as directed and notifying PCP if SBP>150 or DBP > 90, taking all medications as prescribe, and adhering to a low sodium diet as discussed. Interventions:  UNABLE to independently:mange HTN Evaluation of current treatment plan related to hypertension self management and patient's adherence to plan as established by provider. 12-27-2020: The patient has good control of her HTN at this time. States she is eating okay. Denies any issues with her blood pressure. States she feels her heart health is good at this time.  Provided education to patient re: stroke prevention, s/s of heart attack and stroke, DASH diet, complications of uncontrolled blood pressure. 12-27-2020: Review of eating habits and the  patient verbalized she is watching what she eats and doing well. Denies any new concerns. Reviewed medications with patient and discussed importance of compliance. 07-26-2020: States compliance with medications regimen. Has not gotten biotin for hair loss yet due to cost constraints but states she will when she gets some money. It is no worse that what it was when she was in the office. 12-27-2020: States compliance with her medications at this time.  Discussed plans with patient for ongoing care management follow up and provided patient with direct contact information for care management team Advised patient, providing education and rationale, to monitor blood pressure daily and record, calling PCP for findings outside established parameters. The patient is currently not taking blood pressures at home and does not feel like she needs to do so.  Reviewed scheduled/upcoming provider appointments including: 06-08-2021 Patient Goals/Self-Care Activities  patient will:  - UNABLE to independently manage HTN Attends all scheduled provider appointments Calls provider office for new concerns, questions, or BP outside discussed parameters Checks BP and records as discussed- does not check blood pressures at home Follows a low sodium diet/DASH diet - depression screen reviewed - home or ambulatory blood pressure monitoring encouraged Follow Up Plan: Telephone follow up appointment with care management team member scheduled for: 03-07-2021 at 33 am    Care Plan : [name]: Anxiety  Updates made by Vanita Ingles, RN since 03/08/2021 12:00 AM  Completed 03/08/2021   Problem: [name]: Axniety and patient managing  with Cannabis Resolved 03/08/2021  Priority: Medium     Long-Range Goal: [name]: Anxiety and Cannabis Drug Use Managed Completed 03/08/2021  Start Date: 07/26/2020  Expected End Date: 07/26/2021  Recent Progress: On track  Priority: Medium  Note:   Current Barriers: Resolving due to duplicate  goal Knowledge Deficits related to use of Cannabis for control of anxiety Chronic Disease Management support and education needs related to anxiety Lacks caregiver support.  Film/video editor.  Non-adherence to prescribed medication regimen Unable to independently manage anxiety Does not adhere to provider recommendations re: refrain from the use of cannabis to help anxiety Does not contact provider office for questions/concerns  Nurse Case Manager Clinical Goal(s):  patient will verbalize understanding of plan for management of anxiety without the use of cannabis   patient will work with [facility], CCM team and pcp to address needs related to anxiety and how to effectively manage with alternative methods other than substance abuse.  patient will demonstrate a decrease in anxiety exacerbations as evidenced by controlled anxiety patient will attend all scheduled medical appointments: appointment with the pcp on 06-08-2021  Interventions:  1:1 collaboration with Venita Lick, NP regarding development and update of comprehensive plan of care as evidenced by provider attestation and co-signature Inter-disciplinary care team collaboration (see longitudinal plan of care) Provided education to patient re: alternative methods for dealing with anxiety. 07-26-2020: The patient saw the pcp for situational anxiety on 07-05-2020 and 07-11-2020. She is now taking celexa and Buspar and states this is doing well for her. 10-04-2020: The patient states she is doing well with managing her anxiety at this time. Did have a panic attack recently and misplaced her insurance cards and ID. Was a little stressed about this but had numbers to call and information provided to her by [employer] to assist with calling her insurance companies to get new insurance cards. Care Guide referral also placed for help with finding a dentist in [facility]. 12-27-2020: The patient denies any issues with her anxiety at this time. She was  reading her Bible and listening to her tablet at the time of the call. She states she has no anxiety right now and feels great. Is not sleeping the best but states as you get older you don't need as much sleep. Still using cannabis to help with anxiety. The patient feels she is managing her health well at this time.  Discussed plans with patient for ongoing care management follow up and provided patient with direct contact information for care management team Provided patient with mindfullness educational materials related to helping decrease anxiety. The patient states her son is researching information from a Dr. Ardeth Perfect about how to reduce Lithium in the diet and wanted the pcp to know. 12-27-2020: The patient ask about breast exam and pelvic exams. Explained to the patient this is usually performed at OB/GYN unless it has been discussed with the pcp. The patient denies any new concerns but states she wanted to ask about it. Recommended she discuss with the pcp. Reviewed scheduled/upcoming provider appointments including: 06-08-2021  Patient Goals/Self-Care Activities  patient will:  - Patient will self administer medications as prescribed Patient will attend all scheduled provider appointments Patient will call pharmacy for medication refills Patient will call provider office for new concerns or questions Patient will work with [name] to address care coordination needs and will continue to work with the clinical team to address health care and disease management related needs.   - barriers to change identified -  brief intervention provided - counseling provided - stage of change monitored - substance misuse monitored *Uses cannabis regularly     Follow Up Plan: Telephone follow up appointment with care management team member scheduled for: 03-07-2021 at 22 am        Care Plan : [name]: HLD Management  Updates made by Vanita Ingles, RN since 03/08/2021 12:00 AM  Completed 03/08/2021    Problem: [name]: Health Promotion or Disease Self-Management (General Plan of Care) Resolved 03/08/2021  Priority: High     Long-Range Goal: [name]: HLD Self-Management Plan Developed Completed 03/08/2021  Start Date: 07/26/2020  Expected End Date: 09/21/2021  Recent Progress: On track  Priority: Medium  Note:   Current Barriers: Resolving due to duplicate goal Poorly controlled hyperlipidemia, complicated by HTN, non-compliance and anxiety Current antihyperlipidemic regimen: Lipitor 60m QD- changed to Rosuvastatin 10 mg daily in February of 2022 Most recent lipid panel:  Lab Results  Component Value Date   CHOL 186 07/05/2020   CHOL 142 03/29/2020   CHOL 158 09/28/2019   Lab Results  Component Value Date   HDL 45 07/05/2020   HDL 41 03/29/2020   HDL 46 09/28/2019   Lab Results  Component Value Date   LDLCALC 104 (H) 07/05/2020   LDLCALC 70 03/29/2020   LDLCALC 66 09/28/2019   Lab Results  Component Value Date   TRIG 215 (H) 07/05/2020   TRIG 182 (H) 03/29/2020   TRIG 293 (H) 09/28/2019   Lab Results  Component Value Date   CHOLHDL 5.2 02/16/2019   No results found for: LDLDIRECT  ASCVD risk enhancing conditions: age >>3 pre-DM, HTN, Cannabis use, former smoker Unable to independently manage HLD Does not adhere to provider recommendations re: medications at times and heart healthy diet  Does not contact provider office for questions/concerns  RN Care Manager Clinical Goal(s):   patient will work with [facility] providers, and care team towards execution of optimized self-health management plan  patient will verbalize understanding of plan for HLD management  patient will attend all scheduled medical appointments: 1-19-2023with pcp  patient will demonstrate improved adherence to prescribed treatment plan for HLD as evidenced by keeping appointments, compliance with the plan of care and calling for changes   Interventions: Medication review performed;  medication list updated in electronic medical record.  Inter-disciplinary care team collaboration (see longitudinal plan of care) Referred to pharmacy team for assistance with HLD medication management Evaluation of current treatment plan related to HLD and patient's adherence to plan as established by provider. 12-27-2020: The patient denies any issues with her HLD management. States she is doing well. States at this time she is eating good and not adding sodium to her food. Will continue to monitor for changes.  Advised patient to call the provider for changes in condition or questions about health and wellness.  Provided education to patient re: benefits of following a heart healthy diet and checking blood pressures on a regular basis Reviewed medications with patient and discussed compliance. 12-27-2020: Confirms compliance with medications regimen Discussed plans with patient for ongoing care management follow up and provided patient with direct contact information for care management team Reviewed scheduled/upcoming provider appointments including: 06-08-2021 Education on active life style and benefits of staying active for health and well being. 8-9--2022: The patient states she is enjoying the warmer weather and being active. She denies any acute distress. States she is following the recommendations of the provider. Does not go outside when it is very  hot, does go out and enjoys the weather if it is not too hot. Denies any acute distress at this time. Will continue to monitor. Does not do structured activity. Once went to the gym but does not go now.  Patient Goals/Self-Care Activities:  patient will:   - call for medicine refill 2 or 3 days before it runs out - call if I am sick and can't take my medicine - keep a list of all the medicines I take; vitamins and herbals too - learn to read medicine labels - use a pillbox to sort medicine - use an alarm clock or phone to remind me to take my  medicine - drink 6 to 8 glasses of water each day - eat 3 to 5 servings of fruits and vegetables each day - eat 5 or 6 small meals each day - fill half the plate with nonstarchy vegetables - limit fast food meals to no more than 1 per week - manage portion size - prepare main meal at home 3 to 5 days each week - read food labels for fat, fiber, carbohydrates and portion size - set a realistic goal - switch to sugar-free drinks - be open to making changes - I can manage, know and watch for signs of a heart attack - if I have chest pain, call for help - learn about small changes that will make a big difference - learn my personal risk factors  - barriers to meeting goals identified - change-talk evoked - choices provided - collaboration with team encouraged - decision-making supported - difficulty of making life-long changes acknowledged - health risks reviewed - problem-solving facilitated - questions answered - readiness for change evaluated - reassurance provided - resources needed to meet goals identified - self-reflection promoted - self-reliance encouraged - verbalization of feelings encouraged  Follow Up Plan: Telephone follow up appointment with care management team member scheduled for: 03-07-2021 at 25 am    Care Plan : [name]: General Plan of Care (Adult) for Chronic Disease Management and Care Coordination Needs  Updates made by Vanita Ingles, RN since 03/08/2021 12:00 AM     Problem: [name]: Development of a plan of care for Chronic Disease Management (HTN, HLD, Anxiety, Substance use, Insomnia)   Priority: High     Long-Range Goal: [name]: Development of a plan of care for Chronic Disease Management (HTN, HLD, Anxiety, Substance use, Insomnia)   Start Date: 03/08/2021  Expected End Date: 03/08/2022  Priority: High  Note:   Current Barriers:  Knowledge Deficits related to plan of care for management of HTN, HLD, Anxiety with Excessive Worry, Panic  Symptoms, Social Anxiety, Specific Phobias,, and cannibals use and insomnia  Care Coordination needs related to Mental Health Concerns , Family and relationship dysfunction, and Substance abuse issues -  cannibals use  Chronic Disease Management support and education needs related to HTN, HLD, Anxiety with Excessive Worry, Panic Symptoms, Social Anxiety, Specific Phobias,, Panic Disorder , and Insomnia/Sleep Difficulties, and cannibals use and insomnia Lacks caregiver support.  Film/video editor.  Transportation barriers Non-adherence to scheduled provider appointments  [name] Clinical Goal(s):  Patient will verbalize understanding of plan for management of HTN, HLD, Anxiety, and cannibals use and insomnia  verbalize basic understanding of HTN, HLD, Anxiety, and cannibals use and insomnia disease process and self health management plan   take all medications exactly as prescribed and will call provider for medication related questions demonstrate understanding of rationale for each prescribed medication   attend all scheduled  medical appointments: 06-08-2021 at 0920 am demonstrate improved and ongoing health management independence for effective management of chronic diseases and maintain health and well being continue to work with Consulting civil engineer and/or Social Worker to address care management and care coordination needs related to HTN, HLD, Anxiety, and cannibals  use and insomnia  work with Education officer, museum to address Level of care concerns, Mental Health Concerns , Family and relationship dysfunction, Substance abuse issues -  cannibals , and Social Isolation related to the management of HTN, HLD, Anxiety, and cannibals use and insomnia  demonstrate a decrease in HTN, HLD, Anxiety, and cannibals use and insomnia exacerbations   demonstrate ongoing self health care management ability    through collaboration with Consulting civil engineer, provider, and care team.   Interventions: 1:1 collaboration  with primary care provider regarding development and update of comprehensive plan of care as evidenced by provider attestation and co-signature Inter-disciplinary care team collaboration (see longitudinal plan of care) Evaluation of current treatment plan related to  self management and patient's adherence to plan as established by provider   SDOH Barriers (Status: Goal on track: YES.)  Patient interviewed and SDOH assessment performed        Patient interviewed and appropriate assessments performed Provided patient with information about resources available and support for the patient to maintain her health and well being Discussed plans with patient for ongoing care management follow up and provided patient with direct contact information for care management team Advised patient to call the office for changes in [place], new concerns or questions Provided education to patient/caregiver regarding level of care options.    Anxiety  (Status: Goal on track: YES.) Evaluation of current treatment plan related to Anxiety, Mental Health Concerns , Family and relationship dysfunction, Substance abuse issues -  cannibals use, and Social Isolation self-management and patient's adherence to plan as established by provider. Discussed plans with patient for ongoing care management follow up and provided patient with direct contact information for care management team Advised patient to call the office for changes in mood, anxiety, or depression; Provided education to patient re: diversional activities to help when she is feeling anxious.  The patient reports that she is going to the gym every day that she can get transportation and that she is walking a mile and she is also working on the machines. She plans on doing water aerobics in the future. She states this really helps her with her socialization and [facility]  The patient also enjoys reading her Bible. This is a great source of  strength for her; Provided patient with stress reduction educational materials related to preventing exacerbation of anxiety; Reviewed scheduled/upcoming provider appointments including 06-08-2021 at [place] am; Social Work referral for the patient has ongoing support from the LCSW for help with anxiety and other health concerns; Discussed plans with patient for ongoing care management follow up and provided patient with direct contact information for care management team; Advised patient to discuss changes in health and when she feels her anxiety is not well controlled  with provider; Screening for signs and symptoms of depression related to chronic disease state;  Assessed social determinant of health barriers;   Insomnia  (Status: Goal on track: YES.) Evaluation of current treatment plan related to  Insomnia ,  self-management and patient's adherence to plan as established by provider. Discussed plans with patient for ongoing care management follow up and provided patient with direct contact information for care  management team Advised patient to call the office for changes in her insomnia or other concerns preventing her from sleeping well; Provided education to patient re: practicing good sleep hygiene, creating a bedtime routine and taking medications as needed for restful sleep; Reviewed medications with patient and discussed the patient states the Trazadone is working well for her and she takes it when she needs it. States she is not using every night; Provided patient with mindufullness and sleep educational materials related to insomnia; Reviewed scheduled/upcoming provider appointments including 06-08-2021 at 0920 am; Discussed plans with patient for ongoing care management follow up and provided patient with direct contact information for care management team;   Cannibals use  (Status: Goal on track: YES.) Evaluation of current treatment plan related to  Cannibals use , Substance abuse  issues -  cannibals  self-management and patient's adherence to plan as established by provider. Discussed plans with patient for ongoing care management follow up and provided patient with direct contact information for care management team Advised patient to consider other methods of dealing with her anxiety and life circumstance instead of using cannibals; Provided education to patient re: the risk of illicit drug use on body system and the effects of cannibals use; Reviewed scheduled/upcoming provider appointments including 06-08-2021;   Hyperlipidemia:  (Status: Goal on track: YES.) Lab Results  Component Value Date   CHOL 186 07/05/2020   HDL 45 07/05/2020   LDLCALC 104 (H) 07/05/2020   TRIG 215 (H) 07/05/2020   CHOLHDL 5.2 02/16/2019     Medication review performed; medication list updated in electronic medical record.  Provider established cholesterol goals reviewed; Counseled on importance of regular laboratory monitoring as prescribed; Provided HLD educational materials; Reviewed role and benefits of statin for ASCVD risk reduction; Discussed strategies to manage statin-induced myalgias; Reviewed importance of limiting foods high in cholesterol; Reviewed exercise goals and target of 150 minutes per week;  Hypertension: (Status: Goal on track: YES.) Last practice recorded BP readings:  BP Readings from Last 3 Encounters:  12/05/20 120/68  11/04/20 118/76  08/23/20 117/73  Most recent eGFR/CrCl: No results found for: EGFR  No components found for: CRCL  Evaluation of current treatment plan related to hypertension self management and patient's adherence to plan as established by provider;   Provided education to patient re: stroke prevention, s/s of heart attack and stroke; Reviewed prescribed diet heart healthy  Reviewed medications with patient and discussed importance of compliance;  Counseled on adverse effects of illicit drug and excessive alcohol use in patients with  high blood pressure;  Counseled on the importance of exercise goals with target of 150 minutes per week Discussed plans with patient for ongoing care management follow up and provided patient with direct contact information for care management team; Advised patient, providing education and rationale, to monitor blood pressure daily and record, calling PCP for findings outside established parameters.  The patient does not take her blood pressure at home and says she does not feel like she needs to do so.  Reviewed scheduled/upcoming provider appointments including: 06-08-2021 at 0920 am Provided education on prescribed diet heart healthy;  Discussed complications of poorly controlled blood pressure such as heart disease, stroke, circulatory complications, vision complications, kidney impairment, sexual dysfunction;   Patient Goals/Self-Care Activities: Patient will self administer medications as prescribed as evidenced by self report/primary caregiver report  Patient will attend all scheduled provider appointments as evidenced by clinician review of documented attendance to scheduled appointments and patient/caregiver report Patient will call  pharmacy for medication refills as evidenced by patient report and review of pharmacy fill history as appropriate Patient will attend church or other social activities as evidenced by patient report Patient will continue to perform ADL's independently as evidenced by patient/caregiver report Patient will continue to perform IADL's independently as evidenced by patient/caregiver report Patient will call provider office for new concerns or questions as evidenced by review of documented incoming telephone call notes and patient report Patient will work with [name] to address care coordination needs and will continue to work with the clinical team to address health care and disease management related needs as evidenced by documented adherence to scheduled care  management/care coordination appointments - check blood pressure weekly - choose a place to take my blood pressure (home, clinic or office, retail store) - write blood pressure results in a log or diary - learn about high blood pressure - keep a blood pressure log - take blood pressure log to all doctor appointments - call doctor for signs and symptoms of high blood pressure - develop an action plan for high blood pressure - keep all doctor appointments - take medications for blood pressure exactly as prescribed - begin an exercise program - report new symptoms to your doctor - eat more whole grains, fruits and vegetables, lean meats and healthy fats - call for medicine refill 2 or 3 days before it runs out - take all medications exactly as prescribed - call doctor with any symptoms you believe are related to your medicine - call doctor when you experience any new symptoms - go to all doctor appointments as scheduled - adhere to prescribed diet: heart healthy diet       Plan:Telephone follow up appointment with care management team member scheduled for:  05-03-2021 at 25 am  Noreene Larsson RN, MSN, [facility] Mobile: [phone]

## 2021-03-08 NOTE — Patient Instructions (Signed)
Visit Information  PATIENT GOALS:  Goals Addressed             This Visit's Progress    [name]: Keep Myself Safe-Substance Misuse       Follow Up Date 05/03/2021    - eat healthy - exercise more - keep emergency contact information on me - learn how to use public transportation - will not use if driving    Why is this important?   Even if you are not ready to quit using alcohol or drugs, there are things you can do to stay safe.  You can stop the spread of HIV (human immunodeficiency virus) by practicing safe sex.  You can stop the spread of hepatitis or HIV by not sharing needles.  Learning CPR (cardiopulmonary resuscitation) and how to call for help may save you or a friend.     Notes: Drinks wine and occasional use of cannabis. Discussed use with the patient . She says she is being safe and denies any issues at this time. 03-08-2021: Denies any issues with substance abuse. States that she is sleeping better with the use of Trazadone 25 mg as needed. [facility] that [facility] can and walking a mile and working out on the machines. She plans to do water aerobics soon.  She feels like the socialization is really helpful. Will continue to monitor for changes or needs.      [name]: Stop or Cut Down Using Drugs       Timeframe:  Long-Range Goal Priority:  High Start Date:       12-27-2020                      Expected End Date:      12-27-2021                 Follow Up Date 05/03/2021    - avoid people and places where drugs are used - begin personal counseling - limit how much I use    Why is this important?   To stop using drugs it is important to have support from a person, or group of people, that you can count on.  You will also need to think about the things that make you feel like using drugs; then plan for how to handle them.     Notes: 12-27-2020: Drinks wine and uses cannabis occasionally. Education on the effects of substance abuse. The patient does not  see anything wrong with using cannabis or drinking wine. Education and support given. Will continue to monitor. 03-08-2021: Does not like to talk about her use of cannabis or drinking wine. She states she does it occasionally because it helps her. Denies any issues with abuse. Will continue to monitor.         The patient verbalized understanding of instructions, educational materials, and care plan provided today and declined offer to receive copy of patient instructions, educational materials, and care plan.   Telephone follow up appointment with care management team member scheduled for: 05-03-2021 at 27 am  Noreene Larsson RN, MSN, [facility] Mobile: [phone]

## 2021-03-20 DIAGNOSIS — E782 Mixed hyperlipidemia: Secondary | ICD-10-CM

## 2021-03-20 DIAGNOSIS — I10 Essential (primary) hypertension: Secondary | ICD-10-CM | POA: Diagnosis not present

## 2021-03-24 ENCOUNTER — Telehealth: Payer: Medicaid Other

## 2021-04-15 ENCOUNTER — Other Ambulatory Visit: Payer: Self-pay | Admitting: Nurse Practitioner

## 2021-04-16 NOTE — Telephone Encounter (Signed)
last RF 07/05/20 #90 3 RF too soon

## 2021-05-03 ENCOUNTER — Telehealth: Payer: Medicaid Other

## 2021-05-17 ENCOUNTER — Other Ambulatory Visit: Payer: Self-pay | Admitting: Nurse Practitioner

## 2021-05-17 NOTE — Telephone Encounter (Signed)
Requested Prescriptions  Pending Prescriptions Disp Refills   amLODipine (NORVASC) 5 MG tablet [Pharmacy Med Name: AMLODIPINE BESYLATE 5 MG TAB] 90 tablet 4    Sig: TAKE 1 TABLET BY MOUTH ONCE DAILY     Cardiovascular:  Calcium Channel Blockers Passed - 05/17/2021  1:50 PM      Passed - Last BP in normal range    BP Readings from Last 1 Encounters:  12/05/20 120/68         Passed - Valid encounter within last 6 months    Recent Outpatient Visits          3 months ago Insomnia, unspecified type   [place], Lauren A, NP   5 months ago History of cerebellar stroke   [employer], [place] T, NP   6 months ago Situational anxiety   [place], [place] T, NP   8 months ago Situational anxiety   [place], [place] T, NP   10 months ago Situational anxiety   [place], Barbaraann Faster, NP      Future Appointments            In 3 weeks Cannady, Barbaraann Faster, NP [facility]   In 5 months  [facility]

## 2021-06-04 NOTE — Patient Instructions (Signed)

## 2021-06-08 ENCOUNTER — Encounter: Payer: Self-pay | Admitting: Nurse Practitioner

## 2021-06-08 ENCOUNTER — Other Ambulatory Visit: Payer: Self-pay

## 2021-06-08 ENCOUNTER — Ambulatory Visit (INDEPENDENT_AMBULATORY_CARE_PROVIDER_SITE_OTHER): Payer: Medicare HMO | Admitting: Nurse Practitioner

## 2021-06-08 VITALS — BP 132/68 | HR 90 | Temp 98.5°F | Ht 63.5 in | Wt 156.0 lb

## 2021-06-08 DIAGNOSIS — I700 Atherosclerosis of aorta: Secondary | ICD-10-CM | POA: Diagnosis not present

## 2021-06-08 DIAGNOSIS — E538 Deficiency of other specified B group vitamins: Secondary | ICD-10-CM | POA: Diagnosis not present

## 2021-06-08 DIAGNOSIS — E782 Mixed hyperlipidemia: Secondary | ICD-10-CM | POA: Diagnosis not present

## 2021-06-08 DIAGNOSIS — R69 Illness, unspecified: Secondary | ICD-10-CM | POA: Diagnosis not present

## 2021-06-08 DIAGNOSIS — F1290 Cannabis use, unspecified, uncomplicated: Secondary | ICD-10-CM

## 2021-06-08 DIAGNOSIS — Z8673 Personal history of transient ischemic attack (TIA), and cerebral infarction without residual deficits: Secondary | ICD-10-CM

## 2021-06-08 DIAGNOSIS — E559 Vitamin D deficiency, unspecified: Secondary | ICD-10-CM

## 2021-06-08 DIAGNOSIS — I10 Essential (primary) hypertension: Secondary | ICD-10-CM | POA: Diagnosis not present

## 2021-06-08 DIAGNOSIS — F418 Other specified anxiety disorders: Secondary | ICD-10-CM

## 2021-06-08 DIAGNOSIS — R7309 Other abnormal glucose: Secondary | ICD-10-CM

## 2021-06-08 MED ORDER — BUSPIRONE HCL 5 MG PO TABS: 5.0000 mg | ORAL_TABLET | Freq: Two times a day (BID) | ORAL | 4 refills | Status: DC

## 2021-06-08 MED ORDER — AMLODIPINE BESYLATE 5 MG PO TABS: 5.0000 mg | ORAL_TABLET | Freq: Every day | ORAL | 4 refills | Status: DC

## 2021-06-08 MED ORDER — ROSUVASTATIN CALCIUM 10 MG PO TABS
10.0000 mg | ORAL_TABLET | Freq: Every day | ORAL | 4 refills | Status: DC
Start: 2021-06-08 — End: 2022-05-18

## 2021-06-08 MED ORDER — CITALOPRAM HYDROBROMIDE 10 MG PO TABS
10.0000 mg | ORAL_TABLET | Freq: Every day | ORAL | 4 refills | Status: DC
Start: 2021-06-08 — End: 2022-05-18

## 2021-06-08 NOTE — Assessment & Plan Note (Signed)
Ongoing, continue collaboration with vascular + current antiplatelet regimen.

## 2021-06-08 NOTE — Assessment & Plan Note (Signed)
Chronic, stable with BP at goal on recheck.  Continue current medication regimen and adjust as needed.  Labs CBC, CMP, TSH.  Recommend checking BP at home regularly + focus on DASH diet.  Refills sent in.  Return in 6 months.

## 2021-06-08 NOTE — Assessment & Plan Note (Signed)
Reports occasional use.  Recommend cutting back on this.  Continue to recommend no use.

## 2021-06-08 NOTE — Assessment & Plan Note (Signed)
History of old infarct.  Highly recommend she continue her statin and ASA daily. 

## 2021-06-08 NOTE — Assessment & Plan Note (Signed)
Chronic, ongoing.  Continue current medication regimen and adjust as needed.  Lipid panel today.  Return in 6 months. 

## 2021-06-08 NOTE — Assessment & Plan Note (Signed)
Noted on past CT abdomen 08/29/2017.  Recommend she continue ASA and statin daily for prevention.

## 2021-06-08 NOTE — Progress Notes (Signed)
BP 132/68 (BP Location: Left Arm)    Pulse 90    Temp 98.5 F (36.9 C) (Oral)    Ht 5' 3.5" (1.613 m)    Wt 156 lb (70.8 kg)    SpO2 96%    BMI 27.20 kg/m    Subjective:    Patient ID: Tina Hardy, female    DOB: June 16, 1940, 81 y.o.   MRN: [id number]  HPI: Tina Hardy is a 81 y.o. female  Chief Complaint  Patient presents with   Hyperlipidemia   Hypertension   HYPERTENSION / HYPERLIPIDEMIA Continues on Amlodipine and Rosuvastatin + Plavix & ASA.  Has history of stroke and is to follow with vascular as needed.   Satisfied with current treatment? yes Duration of hypertension: chronic BP monitoring frequency: not checking BP range: not checking BP medication side effects: no Past BP meds: none Duration of hyperlipidemia: chronic Cholesterol medication side effects: no Cholesterol supplements: none Past cholesterol medications: none Medication compliance: good compliance Aspirin: yes Recent stressors: no Recurrent headaches: no Visual changes: no Palpitations: no Dyspnea: no Chest pain: no Lower extremity edema: no Dizzy/lightheaded: no   ANXIETY/STRESS Continues on Celexa 10 MG daily and Buspar 5 MG BID as needed.  In past had taken benzo, which she reports helped.  Discussed at length with her these are not recommended in her age group.  Does smoke occasional MJ to help with this at times.  Has a strong faith, which helps her on a daily basis. Duration: stable Anxious mood: yes  Excessive worrying: none Irritability: no  Sweating: no Nausea: no Palpitations:no Hyperventilation: no Panic attacks: none Agoraphobia: no  Obscessions/compulsions: no Depressed mood: no Depression screen [facility] 2/9 06/08/2021 12/27/2020 12/05/2020 11/04/2020 11/04/2020  Decreased Interest 0 0 0 0 0  Down, Depressed, Hopeless 0 0 0 0 0  PHQ - 2 Score 0 0 0 0 0  Altered sleeping 2 0 0 - 0  Tired, decreased energy 0 0 0 - 0  Change in appetite 0 0 0 - 0  Feeling bad or failure about  yourself  0 0 0 - 0  Trouble concentrating 0 0 0 - 0  Moving slowly or fidgety/restless 0 0 0 - 0  Suicidal thoughts 0 0 0 - 0  PHQ-9 Score 2 0 0 - 0  Difficult doing work/chores - Not difficult at all Not difficult at all - Not difficult at all  Some recent data might be hidden  Anhedonia: no Weight changes: no Insomnia: none Hypersomnia: no Fatigue/loss of energy: no Feelings of worthlessness: no Feelings of guilt: no Impaired concentration/indecisiveness: none Suicidal ideations: no  Crying spells: no Recent Stressors/Life Changes: yes   Relationship problems: no   Family stress: none   Financial stress: no    Job stress: no    Recent death/loss: no GAD 7 : Generalized Anxiety Score 06/08/2021 12/05/2020 11/04/2020 08/23/2020  Nervous, Anxious, on Edge 0 0 0 0  Control/stop worrying 0 0 0 0  Worry too much - different things 0 0 0 0  Trouble relaxing 0 0 0 0  Restless 0 0 0 0  Easily annoyed or irritable 0 0 0 0  Afraid - awful might happen 0 0 0 0  Total GAD 7 Score 0 0 0 0  Anxiety Difficulty Not difficult at all Not difficult at all - -   Relevant past medical, surgical, family and social history reviewed and updated as indicated. Interim medical history since our last visit reviewed. Allergies and  medications reviewed and updated.  Review of Systems  Constitutional:  Negative for activity change, appetite change, diaphoresis, fatigue and fever.  Respiratory:  Negative for cough, chest tightness and shortness of breath.   Cardiovascular:  Negative for chest pain, palpitations and leg swelling.  Gastrointestinal: Negative.   Endocrine: Negative.   Neurological: Negative.   Psychiatric/Behavioral: Negative.     Per HPI unless specifically indicated above     Objective:    BP 132/68 (BP Location: Left Arm)    Pulse 90    Temp 98.5 F (36.9 C) (Oral)    Ht 5' 3.5" (1.613 m)    Wt 156 lb (70.8 kg)    SpO2 96%    BMI 27.20 kg/m   Wt Readings from Last 3 Encounters:   06/08/21 156 lb (70.8 kg)  12/05/20 152 lb (68.9 kg)  11/04/20 152 lb (68.9 kg)    Physical Exam Vitals and nursing note reviewed.  Constitutional:      General: She is awake. She is not in acute distress.    Appearance: She is well-developed and well-groomed. She is not ill-appearing.  HENT:     Head: Normocephalic and atraumatic. Hair is normal.     Right Ear: Hearing normal.     Left Ear: Hearing normal.  Eyes:     General: Lids are normal.     Extraocular Movements: Extraocular movements intact.     Pupils: Pupils are equal, round, and reactive to light.  Neck:     Thyroid: No thyromegaly.     Vascular: No carotid bruit or JVD.  Cardiovascular:     Rate and Rhythm: Normal rate and regular rhythm.     Heart sounds: Normal heart sounds. No murmur heard.   No gallop.  Pulmonary:     Effort: Pulmonary effort is normal.     Breath sounds: Normal breath sounds.  Abdominal:     General: Bowel sounds are normal.     Palpations: Abdomen is soft. There is no hepatomegaly or splenomegaly.  Musculoskeletal:     Cervical back: Normal range of motion and neck supple.     Right lower leg: No edema.     Left lower leg: No edema.  Lymphadenopathy:     Cervical: No cervical adenopathy.  Skin:    General: Skin is warm and dry.  Neurological:     Mental Status: She is alert and oriented to person, place, and time.  Psychiatric:        Attention and Perception: Attention normal.        Mood and Affect: Mood normal.        Behavior: Behavior normal. Behavior is cooperative.        Thought Content: Thought content normal.        Judgment: Judgment normal.   Results for orders placed or performed in visit on 07/05/20  Lipid Panel w/o Chol/HDL Ratio  Result Value Ref Range   Cholesterol, Total 186 100 - 199 mg/dL   Triglycerides 215 (H) 0 - 149 mg/dL   HDL 45 >39 mg/dL   VLDL Cholesterol Cal 37 5 - 40 mg/dL   LDL Chol Calc (NIH) 104 (H) 0 - 99 mg/dL  Basic metabolic panel  Result  Value Ref Range   Glucose 97 65 - 99 mg/dL   BUN 13 8 - 27 mg/dL   Creatinine, Ser 0.70 0.57 - 1.00 mg/dL   GFR calc non Af Amer 83 >59 mL/min/1.73   GFR calc Af Wyvonnia Lora  96 >59 mL/min/1.73   BUN/Creatinine Ratio 19 12 - 28   Sodium 140 134 - 144 mmol/L   Potassium 4.3 3.5 - 5.2 mmol/L   Chloride 103 96 - 106 mmol/L   CO2 22 20 - 29 mmol/L   Calcium 9.6 8.7 - 10.3 mg/dL  HgB A1c  Result Value Ref Range   Hgb A1c MFr Bld 5.5 4.8 - 5.6 %   Est. average glucose Bld gHb Est-mCnc 111 mg/dL  Hepatitis C antibody  Result Value Ref Range   Hep C Virus Ab <0.1 0.0 - 0.9 s/co ratio  TSH  Result Value Ref Range   TSH 3.460 0.450 - 4.500 uIU/mL      Assessment & Plan:   Problem List Items Addressed This Visit       Cardiovascular and Mediastinum   Aortic atherosclerosis ([place]) - Primary    Noted on past CT abdomen 08/29/2017.  Recommend she continue ASA and statin daily for prevention.      Relevant Medications   amLODipine (NORVASC) 5 MG tablet   rosuvastatin (CRESTOR) 10 MG tablet   Other Relevant Orders   Comprehensive metabolic panel   Lipid Panel w/o Chol/HDL Ratio   Hypertension    Chronic, stable with BP at goal on recheck.  Continue current medication regimen and adjust as needed.  Labs CBC, CMP, TSH.  Recommend checking BP at home regularly + focus on DASH diet.  Refills sent in.  Return in 6 months.      Relevant Medications   amLODipine (NORVASC) 5 MG tablet   rosuvastatin (CRESTOR) 10 MG tablet   Other Relevant Orders   Comprehensive metabolic panel   CBC with Differential/Platelet   TSH     Other   History of cerebellar stroke    History of old infarct.  Highly recommend she continue her statin and ASA daily.      Hyperlipidemia    Chronic, ongoing.  Continue current medication regimen and adjust as needed.  Lipid panel today.  Return in 6 months.      Relevant Medications   amLODipine (NORVASC) 5 MG tablet   rosuvastatin (CRESTOR) 10 MG tablet   Other  Relevant Orders   Comprehensive metabolic panel   Lipid Panel w/o Chol/HDL Ratio   Marijuana use    Reports occasional use.  Recommend cutting back on this.  Continue to recommend no use.      Situational anxiety    Ongoing and chronic. Denies SI/HI.  Continue Celexa and Buspar which offer her benefit.  Would avoid benzo use due to age and risk for chronic use.  Does not wish to attend psychiatry.  Return in 6 months.      Relevant Medications   busPIRone (BUSPAR) 5 MG tablet   citalopram (CELEXA) 10 MG tablet   Other Visit Diagnoses     Vitamin D deficiency       History of low levels, check today and initiate supplement as needed.   Relevant Orders   VITAMIN D 25 Hydroxy (Vit-D Deficiency, Fractures)   Vitamin B12 deficiency       History of low levels, check today and initiate supplement as needed.   Relevant Orders   B12        Follow up plan: Return in about 6 months (around 12/06/2021) for HTN/HLD, MOOD.

## 2021-06-08 NOTE — Assessment & Plan Note (Signed)
Ongoing and chronic. Denies SI/HI.  Continue Celexa and Buspar which offer her benefit.  Would avoid benzo use due to age and risk for chronic use.  Does not wish to attend psychiatry.  Return in 6 months. 

## 2021-06-09 DIAGNOSIS — E538 Deficiency of other specified B group vitamins: Secondary | ICD-10-CM | POA: Insufficient documentation

## 2021-06-09 DIAGNOSIS — E559 Vitamin D deficiency, unspecified: Secondary | ICD-10-CM | POA: Insufficient documentation

## 2021-06-09 LAB — CBC WITH DIFFERENTIAL/PLATELET
Basophils Absolute: 0.1 10*3/uL (ref 0.0–0.2)
Basos: 1 %
EOS (ABSOLUTE): 0.1 10*3/uL (ref 0.0–0.4)
Eos: 1 %
Hematocrit: 43.2 % (ref 34.0–46.6)
Hemoglobin: 14.1 g/dL (ref 11.1–15.9)
Immature Grans (Abs): 0 10*3/uL (ref 0.0–0.1)
Immature Granulocytes: 0 %
Lymphocytes Absolute: 2.1 10*3/uL (ref 0.7–3.1)
Lymphs: 26 %
MCH: 29.9 pg (ref 26.6–33.0)
MCHC: 32.6 g/dL (ref 31.5–35.7)
MCV: 92 fL (ref 79–97)
Monocytes Absolute: 0.4 10*3/uL (ref 0.1–0.9)
Monocytes: 6 %
Neutrophils Absolute: 5.2 10*3/uL (ref 1.4–7.0)
Neutrophils: 66 %
Platelets: 419 10*3/uL (ref 150–450)
RBC: 4.71 x10E6/uL (ref 3.77–5.28)
RDW: 11.8 % (ref 11.7–15.4)
WBC: 7.9 10*3/uL (ref 3.4–10.8)

## 2021-06-09 LAB — COMPREHENSIVE METABOLIC PANEL
ALT: 17 IU/L (ref 0–32)
AST: 19 IU/L (ref 0–40)
Albumin/Globulin Ratio: 1.7 (ref 1.2–2.2)
Albumin: 4.1 g/dL (ref 3.7–4.7)
Alkaline Phosphatase: 92 IU/L (ref 44–121)
BUN/Creatinine Ratio: 16 (ref 12–28)
BUN: 13 mg/dL (ref 8–27)
Bilirubin Total: 0.4 mg/dL (ref 0.0–1.2)
CO2: 23 mmol/L (ref 20–29)
Calcium: 9.7 mg/dL (ref 8.7–10.3)
Chloride: 102 mmol/L (ref 96–106)
Creatinine, Ser: 0.8 mg/dL (ref 0.57–1.00)
Globulin, Total: 2.4 g/dL (ref 1.5–4.5)
Glucose: 124 mg/dL — ABNORMAL HIGH (ref 70–99)
Potassium: 4.1 mmol/L (ref 3.5–5.2)
Sodium: 138 mmol/L (ref 134–144)
Total Protein: 6.5 g/dL (ref 6.0–8.5)
eGFR: 75 mL/min/{1.73_m2} (ref >59)

## 2021-06-09 LAB — VITAMIN D 25 HYDROXY (VIT D DEFICIENCY, FRACTURES): Vit D, 25-Hydroxy: 21.8 ng/mL — ABNORMAL LOW (ref 30.0–100.0)

## 2021-06-09 LAB — VITAMIN B12: Vitamin B-12: 367 pg/mL (ref 232–1245)

## 2021-06-09 LAB — LIPID PANEL W/O CHOL/HDL RATIO
Cholesterol, Total: 115 mg/dL (ref 100–199)
HDL: 40 mg/dL (ref >39)
LDL Chol Calc (NIH): 47 mg/dL (ref 0–99)
Triglycerides: 168 mg/dL — ABNORMAL HIGH (ref 0–149)
VLDL Cholesterol Cal: 28 mg/dL (ref 5–40)

## 2021-06-09 LAB — TSH: TSH: 3.55 u[IU]/mL (ref 0.450–4.500)

## 2021-06-09 MED ORDER — VITAMIN D3 50 MCG (2000 UT) PO CAPS: 2000.0000 [IU] | ORAL_CAPSULE | Freq: Every day | ORAL | 4 refills | Status: DC

## 2021-06-09 MED ORDER — VITAMIN B-12 1000 MCG PO TABS: 1000.0000 ug | ORAL_TABLET | Freq: Every day | ORAL | 4 refills | Status: DC

## 2021-06-09 NOTE — Addendum Note (Signed)
Addended by: Venita Lick on: 06/09/2021 08:36 AM   Modules accepted: Orders

## 2021-06-09 NOTE — Progress Notes (Signed)
Good morning, please let Tina Hardy know her labs have returned, overall they remain stable with exception of Vitamin D level and B12 level.  I would like her to start supplements for these -- to help bone health and nervous system health.  I will try sending them in, but if not covered then I recommend taking Vitamin D3 2000 units daily and Vitamin B12 1000 MCG daily.  Any questions? Keep being amazing!!  Thank you for allowing me to participate in your care.  I appreciate you. Kindest regards, Tuere Nwosu

## 2021-06-28 ENCOUNTER — Telehealth: Payer: Medicaid Other

## 2021-06-28 ENCOUNTER — Ambulatory Visit (INDEPENDENT_AMBULATORY_CARE_PROVIDER_SITE_OTHER): Payer: Medicare HMO

## 2021-06-28 DIAGNOSIS — E782 Mixed hyperlipidemia: Secondary | ICD-10-CM

## 2021-06-28 DIAGNOSIS — F418 Other specified anxiety disorders: Secondary | ICD-10-CM

## 2021-06-28 DIAGNOSIS — F1290 Cannabis use, unspecified, uncomplicated: Secondary | ICD-10-CM

## 2021-06-28 DIAGNOSIS — G4700 Insomnia, unspecified: Secondary | ICD-10-CM

## 2021-06-28 DIAGNOSIS — I10 Essential (primary) hypertension: Secondary | ICD-10-CM

## 2021-06-28 NOTE — Chronic Care Management (AMB) (Signed)
Chronic Care Management   CCM RN Visit Note  06/28/2021 Name: Tina Hardy MRN: [id number] DOB: [birth year]  Subjective: Tina Hardy is a 81 y.o. year old female who is a primary care patient of Cannady, Barbaraann Faster, NP. The care management team was consulted for assistance with disease management and care coordination needs.    Engaged with patient by telephone for follow up visit in response to provider referral for case management and/or care coordination services.   Consent to Services:  The patient was given information about Chronic Care Management services, agreed to services, and gave verbal consent prior to initiation of services.  Please see initial visit note for detailed documentation.   Patient agreed to services and verbal consent obtained.   Assessment: Review of patient past medical history, allergies, medications, health status, including review of consultants reports, laboratory and other test data, was performed as part of comprehensive evaluation and provision of chronic care management services.   SDOH (Social Determinants of Health) assessments and interventions performed:    CCM Care Plan  Allergies  Allergen Reactions   Codeine Other (See Comments)    Unknown reaction    Penicillins Other (See Comments)    Unknown reaction  Has patient had a PCN reaction causing immediate rash, facial/tongue/throat swelling, SOB or lightheadedness with hypotension: n/a Has patient had a PCN reaction causing severe rash involving mucus membranes or skin necrosis: n/a Has patient had a PCN reaction that required hospitalization: n/a Has patient had a PCN reaction occurring within the last 10 years: n/a If all of the above answers are "NO", then may proceed with Cephalosporin use.     Outpatient Encounter Medications as of 06/28/2021  Medication Sig   amLODipine (NORVASC) 5 MG tablet Take 1 tablet (5 mg total) by mouth daily.   aspirin EC 81 MG EC tablet Take 1 tablet (81  mg total) by mouth daily.   busPIRone (BUSPAR) 5 MG tablet Take 1 tablet (5 mg total) by mouth 2 (two) times daily.   Cholecalciferol (VITAMIN D3) 50 MCG (2000 UT) capsule Take 1 capsule (2,000 Units total) by mouth daily.   citalopram (CELEXA) 10 MG tablet Take 1 tablet (10 mg total) by mouth daily.   clopidogrel (PLAVIX) 75 MG tablet Take 1 tablet (75 mg total) by mouth daily at 6 (six) AM.   rosuvastatin (CRESTOR) 10 MG tablet Take 1 tablet (10 mg total) by mouth daily.   vitamin B-12 (CYANOCOBALAMIN) 1000 MCG tablet Take 1 tablet (1,000 mcg total) by mouth daily.   No facility-administered encounter medications on file as of 06/28/2021.    Patient Active Problem List   Diagnosis Date Noted   Vitamin D deficiency 06/09/2021   Vitamin B12 deficiency 06/09/2021   DDD (degenerative disc disease), lumbar 06/18/2020   Aortic atherosclerosis (Trevose) 06/18/2020   History of cerebellar stroke 06/18/2020   Elevated hemoglobin A1c 05/26/2019   Aortic valve sclerosis 04/24/2019   Situational anxiety 04/24/2019   Stenosis of left carotid artery 03/23/2019   Insomnia 03/23/2019   Marijuana use 03/23/2019   Hypertension 03/22/2019   Hyperlipidemia 03/22/2019   Ludwig's angina 07/04/2017   Hepatic hemangioma 07/16/2012   Diverticulosis of colon 06/22/2008    Conditions to be addressed/monitored:HTN, HLD, Anxiety, and substance Abuse, and insomnia   Care Plan : [name]: General Plan of Care (Adult) for Chronic Disease Management and Care Coordination Needs  Updates made by Vanita Ingles, RN since 06/28/2021 12:00 AM     Problem: [name]: Development  of a plan of care for Chronic Disease Management (HTN, HLD, Anxiety, Substance use, Insomnia)   Priority: High     Long-Range Goal: [name]: Development of a plan of care for Chronic Disease Management (HTN, HLD, Anxiety, Substance use, Insomnia)   Start Date: 03/08/2021  Expected End Date: 03/08/2022  Priority: High  Note:   Current Barriers:   Knowledge Deficits related to plan of care for management of HTN, HLD, Anxiety with Excessive Worry, Panic Symptoms, Social Anxiety, Specific Phobias,, and cannibals use and insomnia  Care Coordination needs related to Mental Health Concerns , Family and relationship dysfunction, and Substance abuse issues -  cannibals use  Chronic Disease Management support and education needs related to HTN, HLD, Anxiety with Excessive Worry, Panic Symptoms, Social Anxiety, Specific Phobias,, Panic Disorder , and Insomnia/Sleep Difficulties, and cannibals use and insomnia Lacks caregiver support.  Film/video editor.  Transportation barriers Non-adherence to scheduled provider appointments  [name] Clinical Goal(s):  Patient will verbalize understanding of plan for management of HTN, HLD, Anxiety, and cannibals use and insomnia  verbalize basic understanding of HTN, HLD, Anxiety, and cannibals use and insomnia disease process and self health management plan  take all medications exactly as prescribed and will call provider for medication related questions demonstrate understanding of rationale for each prescribed medication  attend all scheduled medical appointments: 12-06-2021 at 0900 am demonstrate improved and ongoing health management independence for effective management of chronic diseases and maintain health and well being continue to work with Consulting civil engineer and/or Social Worker to address care management and care coordination needs related to HTN, HLD, Anxiety, and cannibals  use and insomnia  work with Education officer, museum to address Level of care concerns, Mental Health Concerns , Family and relationship dysfunction, Substance abuse issues -  cannibals , and Social Isolation related to the management of HTN, HLD, Anxiety, and cannibals use and insomnia  demonstrate a decrease in HTN, HLD, Anxiety, and cannibals use and insomnia exacerbations  demonstrate ongoing self health care management ability   through collaboration with Consulting civil engineer, provider, and care team.   Interventions: 1:1 collaboration with primary care provider regarding development and update of comprehensive plan of care as evidenced by provider attestation and co-signature Inter-disciplinary care team collaboration (see longitudinal plan of care) Evaluation of current treatment plan related to  self management and patient's adherence to plan as established by provider   SDOH Barriers (Status: Goal on track: YES.)  Patient interviewed and SDOH assessment performed        Patient interviewed and appropriate assessments performed Provided patient with information about resources available and support for the patient to maintain her health and well being Discussed plans with patient for ongoing care management follow up and provided patient with direct contact information for care management team Advised patient to call the office for changes in [place], new concerns or questions Provided education to patient/caregiver regarding level of care options.    Anxiety  (Status: Goal on track: YES.) Evaluation of current treatment plan related to Anxiety, Mental Health Concerns , Family and relationship dysfunction, Substance abuse issues -  cannibals use, and Social Isolation self-management and patient's adherence to plan as established by provider. 06-28-2021: The patient states that she is doing well and denies any acute distress. The patient says she has not been able to afford her Vitamins, but she will get when she can. She is happy today and talkative. She denies new concerns.  Discussed plans with patient for ongoing care  management follow up and provided patient with direct contact information for care management team Advised patient to call the office for changes in mood, anxiety, or depression; Provided education to patient re: diversional activities to help when she is feeling anxious.  The patient reports that she is  going to the gym every day that she can get transportation and that she is walking a mile and she is also working on the machines. She plans on doing water aerobics in the future. She states this really helps her with her socialization and [facility]  The patient also enjoys reading her Bible. This is a great source of strength for her; Provided patient with stress reduction educational materials related to preventing exacerbation of anxiety; Reviewed scheduled/upcoming provider appointments including 12-06-2021 at 0900 am; Social Work referral for the patient has ongoing support from the LCSW for help with anxiety and other health concerns; Discussed plans with patient for ongoing care management follow up and provided patient with direct contact information for care management team; Advised patient to discuss changes in health and when she feels her anxiety is not well controlled  with provider; Screening for signs and symptoms of depression related to chronic disease state;  Assessed social determinant of health barriers;   Insomnia  (Status: Goal on track: YES.) Evaluation of current treatment plan related to  Insomnia ,  self-management and patient's adherence to plan as established by provider. 06-28-2021: Her sleeping patterns come and go. She states she feels she is getting enough rest.  Discussed plans with patient for ongoing care management follow up and provided patient with direct contact information for care management team Advised patient to call the office for changes in her insomnia or other concerns preventing her from sleeping well; Provided education to patient re: practicing good sleep hygiene, creating a bedtime routine and taking medications as needed for restful sleep; Reviewed medications with patient and discussed the patient states the Trazadone is working well for her and she takes it when she needs it. States she is not using every night; Provided patient  with mindufullness and sleep educational materials related to insomnia; Reviewed scheduled/upcoming provider appointments including 12-06-2021 at 0900 am; Discussed plans with patient for ongoing care management follow up and provided patient with direct contact information for care management team;   Cannibals use  (Status: Goal on track: YES.) Evaluation of current treatment plan related to  Cannibals use , Substance abuse issues -  cannibals  self-management and patient's adherence to plan as established by provider. 06-28-2021: Is using occasionally. Encouraged cessation of use.  Discussed plans with patient for ongoing care management follow up and provided patient with direct contact information for care management team Advised patient to consider other methods of dealing with her anxiety and life circumstance instead of using cannibals; Provided education to patient re: the risk of illicit drug use on body system and the effects of cannibals use; Reviewed scheduled/upcoming provider appointments including 12-06-2021;   Hyperlipidemia:  (Status: Goal on track: YES.) Lab Results  Component Value Date   CHOL 115 06/08/2021   HDL 40 06/08/2021   LDLCALC 47 06/08/2021   TRIG 168 (H) 06/08/2021   CHOLHDL 5.2 02/16/2019     Medication review performed; medication list updated in electronic medical record.  Provider established cholesterol goals reviewed. 06-28-2021: Review and education given; Counseled on importance of regular laboratory monitoring as prescribed. 06-28-2021: Has on a consistent basis most recent in January of 2023;  Provided HLD educational materials; Reviewed role and benefits of statin for ASCVD risk reduction; Discussed strategies to manage statin-induced myalgias; Reviewed importance of limiting foods high in cholesterol. 06-28-2021: Review of heart healthy diet. States she doesn't have the best of appetite but she is eating and staying hydrated.  Reviewed exercise goals and  target of 150 minutes per week;  Hypertension: (Status: Goal on track: YES.) Last practice recorded BP readings:  BP Readings from Last 3 Encounters:  06/08/21 132/68  12/05/20 120/68  11/04/20 118/76  Most recent eGFR/CrCl: No results found for: EGFR  No components found for: CRCL  Evaluation of current treatment plan related to hypertension self management and patient's adherence to plan as established by provider. 06-28-2021: Denies any issues with her HTN or heart health. Blood pressures have been stable;   Provided education to patient re: stroke prevention, s/s of heart attack and stroke; Reviewed prescribed diet heart healthy  Reviewed medications with patient and discussed importance of compliance;  Counseled on adverse effects of illicit drug and excessive alcohol use in patients with high blood pressure;  Counseled on the importance of exercise goals with target of 150 minutes per week Discussed plans with patient for ongoing care management follow up and provided patient with direct contact information for care management team; Advised patient, providing education and rationale, to monitor blood pressure daily and record, calling PCP for findings outside established parameters.  The patient does not take her blood pressure at home and says she does not feel like she needs to do so.  Reviewed scheduled/upcoming provider appointments including: 12-06-2021 at 0900 am Provided education on prescribed diet heart healthy;  Discussed complications of poorly controlled blood pressure such as heart disease, stroke, circulatory complications, vision complications, kidney impairment, sexual dysfunction;   Patient Goals/Self-Care Activities: Patient will self administer medications as prescribed as evidenced by self report/primary caregiver report  Patient will attend all scheduled provider appointments as evidenced by clinician review of documented attendance to scheduled appointments and  patient/caregiver report Patient will call pharmacy for medication refills as evidenced by patient report and review of pharmacy fill history as appropriate Patient will attend church or other social activities as evidenced by patient report Patient will continue to perform ADL's independently as evidenced by patient/caregiver report Patient will continue to perform IADL's independently as evidenced by patient/caregiver report Patient will call provider office for new concerns or questions as evidenced by review of documented incoming telephone call notes and patient report Patient will work with [name] to address care coordination needs and will continue to work with the clinical team to address health care and disease management related needs as evidenced by documented adherence to scheduled care management/care coordination appointments - check blood pressure weekly - choose a place to take my blood pressure (home, clinic or office, retail store) - write blood pressure results in a log or diary - learn about high blood pressure - keep a blood pressure log - take blood pressure log to all doctor appointments - call doctor for signs and symptoms of high blood pressure - develop an action plan for high blood pressure - keep all doctor appointments - take medications for blood pressure exactly as prescribed - begin an exercise program - report new symptoms to your doctor - eat more whole grains, fruits and vegetables, lean meats and healthy fats - call for medicine refill 2 or 3 days before it runs out - take all medications exactly as prescribed - call doctor with any  symptoms you believe are related to your medicine - call doctor when you experience any new symptoms - go to all doctor appointments as scheduled - adhere to prescribed diet: heart healthy diet       Plan:Telephone follow up appointment with care management team member scheduled for:  08-30-2021 at 19 am  [place], MSN,  [facility] Mobile: [phone]

## 2021-06-28 NOTE — Patient Instructions (Signed)
Visit Information  Thank you for taking time to visit with me today. Please don't hesitate to contact me if I can be of assistance to you before our next scheduled telephone appointment.  Following are the goals we discussed today:  [name] Clinical Goal(s):  Patient will verbalize understanding of plan for management of HTN, HLD, Anxiety, and cannibals use and insomnia  verbalize basic understanding of HTN, HLD, Anxiety, and cannibals use and insomnia disease process and self health management plan  take all medications exactly as prescribed and will call provider for medication related questions demonstrate understanding of rationale for each prescribed medication  attend all scheduled medical appointments: 12-06-2021 at 0900 am demonstrate improved and ongoing health management independence for effective management of chronic diseases and maintain health and well being continue to work with Consulting civil engineer and/or Social Worker to address care management and care coordination needs related to HTN, HLD, Anxiety, and cannibals  use and insomnia  work with Education officer, museum to address Level of care concerns, Mental Health Concerns , Family and relationship dysfunction, Substance abuse issues -  cannibals , and Social Isolation related to the management of HTN, HLD, Anxiety, and cannibals use and insomnia  demonstrate a decrease in HTN, HLD, Anxiety, and cannibals use and insomnia exacerbations  demonstrate ongoing self health care management ability  through collaboration with Consulting civil engineer, provider, and care team.    Interventions: 1:1 collaboration with primary care provider regarding development and update of comprehensive plan of care as evidenced by provider attestation and co-signature Inter-disciplinary care team collaboration (see longitudinal plan of care) Evaluation of current treatment plan related to  self management and patient's adherence to plan as established by provider     SDOH  Barriers (Status: Goal on track: YES.)  Patient interviewed and SDOH assessment performed        Patient interviewed and appropriate assessments performed Provided patient with information about resources available and support for the patient to maintain her health and well being Discussed plans with patient for ongoing care management follow up and provided patient with direct contact information for care management team Advised patient to call the office for changes in [place], new concerns or questions Provided education to patient/caregiver regarding level of care options.       Anxiety  (Status: Goal on track: YES.) Evaluation of current treatment plan related to Anxiety, Mental Health Concerns , Family and relationship dysfunction, Substance abuse issues -  cannibals use, and Social Isolation self-management and patient's adherence to plan as established by provider. 06-28-2021: The patient states that she is doing well and denies any acute distress. The patient says she has not been able to afford her Vitamins, but she will get when she can. She is happy today and talkative. She denies new concerns.  Discussed plans with patient for ongoing care management follow up and provided patient with direct contact information for care management team Advised patient to call the office for changes in mood, anxiety, or depression; Provided education to patient re: diversional activities to help when she is feeling anxious.  The patient reports that she is going to the gym every day that she can get transportation and that she is walking a mile and she is also working on the machines. She plans on doing water aerobics in the future. She states this really helps her with her socialization and [facility]  The patient also enjoys reading her Bible. This is a great  source of strength for her; Provided patient with stress reduction educational materials related to preventing exacerbation of  anxiety; Reviewed scheduled/upcoming provider appointments including 12-06-2021 at 0900 am; Social Work referral for the patient has ongoing support from the LCSW for help with anxiety and other health concerns; Discussed plans with patient for ongoing care management follow up and provided patient with direct contact information for care management team; Advised patient to discuss changes in health and when she feels her anxiety is not well controlled  with provider; Screening for signs and symptoms of depression related to chronic disease state;  Assessed social determinant of health barriers;    Insomnia  (Status: Goal on track: YES.) Evaluation of current treatment plan related to  Insomnia ,  self-management and patient's adherence to plan as established by provider. 06-28-2021: Her sleeping patterns come and go. She states she feels she is getting enough rest.  Discussed plans with patient for ongoing care management follow up and provided patient with direct contact information for care management team Advised patient to call the office for changes in her insomnia or other concerns preventing her from sleeping well; Provided education to patient re: practicing good sleep hygiene, creating a bedtime routine and taking medications as needed for restful sleep; Reviewed medications with patient and discussed the patient states the Trazadone is working well for her and she takes it when she needs it. States she is not using every night; Provided patient with mindufullness and sleep educational materials related to insomnia; Reviewed scheduled/upcoming provider appointments including 12-06-2021 at 0900 am; Discussed plans with patient for ongoing care management follow up and provided patient with direct contact information for care management team;    Cannibals use  (Status: Goal on track: YES.) Evaluation of current treatment plan related to  Cannibals use , Substance abuse issues -  cannibals   self-management and patient's adherence to plan as established by provider. 06-28-2021: Is using occasionally. Encouraged cessation of use.  Discussed plans with patient for ongoing care management follow up and provided patient with direct contact information for care management team Advised patient to consider other methods of dealing with her anxiety and life circumstance instead of using cannibals; Provided education to patient re: the risk of illicit drug use on body system and the effects of cannibals use; Reviewed scheduled/upcoming provider appointments including 12-06-2021;    Hyperlipidemia:  (Status: Goal on track: YES.)      Lab Results  Component Value Date    CHOL 115 06/08/2021    HDL 40 06/08/2021    LDLCALC 47 06/08/2021    TRIG 168 (H) 06/08/2021    CHOLHDL 5.2 02/16/2019      Medication review performed; medication list updated in electronic medical record.  Provider established cholesterol goals reviewed. 06-28-2021: Review and education given; Counseled on importance of regular laboratory monitoring as prescribed. 06-28-2021: Has on a consistent basis most recent in January of 2023; Provided HLD educational materials; Reviewed role and benefits of statin for ASCVD risk reduction; Discussed strategies to manage statin-induced myalgias; Reviewed importance of limiting foods high in cholesterol. 06-28-2021: Review of heart healthy diet. States she doesn't have the best of appetite but she is eating and staying hydrated.  Reviewed exercise goals and target of 150 minutes per week;   Hypertension: (Status: Goal on track: YES.) Last practice recorded BP readings:     BP Readings from Last 3 Encounters:  06/08/21 132/68  12/05/20 120/68  11/04/20 118/76  Most recent eGFR/CrCl: No results  found for: EGFR  No components found for: CRCL   Evaluation of current treatment plan related to hypertension self management and patient's adherence to plan as established by provider.  06-28-2021: Denies any issues with her HTN or heart health. Blood pressures have been stable;   Provided education to patient re: stroke prevention, s/s of heart attack and stroke; Reviewed prescribed diet heart healthy  Reviewed medications with patient and discussed importance of compliance;  Counseled on adverse effects of illicit drug and excessive alcohol use in patients with high blood pressure;  Counseled on the importance of exercise goals with target of 150 minutes per week Discussed plans with patient for ongoing care management follow up and provided patient with direct contact information for care management team; Advised patient, providing education and rationale, to monitor blood pressure daily and record, calling PCP for findings outside established parameters.  The patient does not take her blood pressure at home and says she does not feel like she needs to do so.  Reviewed scheduled/upcoming provider appointments including: 12-06-2021 at 0900 am Provided education on prescribed diet heart healthy;  Discussed complications of poorly controlled blood pressure such as heart disease, stroke, circulatory complications, vision complications, kidney impairment, sexual dysfunction;    Patient Goals/Self-Care Activities: Patient will self administer medications as prescribed as evidenced by self report/primary caregiver report  Patient will attend all scheduled provider appointments as evidenced by clinician review of documented attendance to scheduled appointments and patient/caregiver report Patient will call pharmacy for medication refills as evidenced by patient report and review of pharmacy fill history as appropriate Patient will attend church or other social activities as evidenced by patient report Patient will continue to perform ADL's independently as evidenced by patient/caregiver report Patient will continue to perform IADL's independently as evidenced by patient/caregiver  report Patient will call provider office for new concerns or questions as evidenced by review of documented incoming telephone call notes and patient report Patient will work with [name] to address care coordination needs and will continue to work with the clinical team to address health care and disease management related needs as evidenced by documented adherence to scheduled care management/care coordination appointments - check blood pressure weekly - choose a place to take my blood pressure (home, clinic or office, retail store) - write blood pressure results in a log or diary - learn about high blood pressure - keep a blood pressure log - take blood pressure log to all doctor appointments - call doctor for signs and symptoms of high blood pressure - develop an action plan for high blood pressure - keep all doctor appointments - take medications for blood pressure exactly as prescribed - begin an exercise program - report new symptoms to your doctor - eat more whole grains, fruits and vegetables, lean meats and healthy fats - call for medicine refill 2 or 3 days before it runs out - take all medications exactly as prescribed - call doctor with any symptoms you believe are related to your medicine - call doctor when you experience any new symptoms - go to all doctor appointments as scheduled - adhere to prescribed diet: heart healthy diet      Our next appointment is by telephone on 08-30-2021 at 1145 am  Please call the care guide team at [phone] if you need to cancel or reschedule your appointment.   If you are experiencing a Mental Health or [place] or need someone to talk to, please call the Suicide and Crisis  Lifeline: 988 call the [place] National Suicide Prevention Lifeline: 765-642-6368 or TTY: 848-553-4507 TTY 574-216-6274) to talk to a trained counselor call 1-800-273-TALK (toll free, 24 hour hotline)   The patient verbalized understanding of  instructions, educational materials, and care plan provided today and declined offer to receive copy of patient instructions, educational materials, and care plan.   Noreene Larsson RN, MSN, [facility] Mobile: [phone]

## 2021-07-18 DIAGNOSIS — E782 Mixed hyperlipidemia: Secondary | ICD-10-CM

## 2021-07-18 DIAGNOSIS — I10 Essential (primary) hypertension: Secondary | ICD-10-CM

## 2021-08-08 ENCOUNTER — Telehealth: Payer: Self-pay

## 2021-08-08 NOTE — Progress Notes (Signed)
? ? ?  Chronic Care Management ?Pharmacy Assistant  ? ?Name: Tina Hardy  MRN: [id number] DOB: July 11, 1940 ? ?Tina Hardy is an 81 y.o. year old female who presents for his follow-up CCM visit with the clinical pharmacist. ? ?Reason for Encounter: Disease State-General ?  ? ?Recent office visits:  ?None ID ? ?Recent consult visits:  ?None ID ? ?Hospital visits:  ?None since last coordination call ? ?Medications: ?Outpatient Encounter Medications as of 08/08/2021  ?Medication Sig  ? amLODipine (NORVASC) 5 MG tablet Take 1 tablet (5 mg total) by mouth daily.  ? aspirin EC 81 MG EC tablet Take 1 tablet (81 mg total) by mouth daily.  ? busPIRone (BUSPAR) 5 MG tablet Take 1 tablet (5 mg total) by mouth 2 (two) times daily.  ? Cholecalciferol (VITAMIN D3) 50 MCG (2000 UT) capsule Take 1 capsule (2,000 Units total) by mouth daily.  ? citalopram (CELEXA) 10 MG tablet Take 1 tablet (10 mg total) by mouth daily.  ? clopidogrel (PLAVIX) 75 MG tablet Take 1 tablet (75 mg total) by mouth daily at 6 (six) AM.  ? rosuvastatin (CRESTOR) 10 MG tablet Take 1 tablet (10 mg total) by mouth daily.  ? vitamin B-12 (CYANOCOBALAMIN) 1000 MCG tablet Take 1 tablet (1,000 mcg total) by mouth daily.  ? ?No facility-administered encounter medications on file as of 08/08/2021.  ? ?Have you had any problems recently with your health?Patient states that she has had diarrhea all day not sure why. She states that she does not have anything to take for it. She also states that she is out of rosuvastatin for about a week but does not have any money to get it refill yet. ? ?Have you had any problems with your pharmacy?Patient states that she does not have any problems with getting her medications from the pharmacy, most of them she gets at no cost. She is not sure which ones she has to pay for ? ?What issues or side effects are you having with your medications? Patient states that she is not having any side effects from medications  ? ?What would  you like me to pass along to [facility] Nasuti Potts,[facility] for them to help you with? Patient states that other than the diarrhea she is not dealing with so far. ? ?What can we do to take care of you better? Patient states that she has tried to call the the office to see if they can call her in a refill for rosuvastatin but she has not heard back from anyone. I called [facility] to check to see if she had any prescriptions to pickup. The pharmacist said that she just got a refill on the Rosuvastatin on 06/27/21 a 90 ds supply and she did not have to pay for it,  but that she should be out of the plavix which is over due and the charge is $1.35. Patient continues to say she is out of Rosuvastatin. ? ?Care Gaps: ?Colonoscopy-NA ?Diabetic Foot Exam-NA ?Mammogram-NA ?Ophthalmology-NA ?Dexa Scan - Patient Declined ?Annual Well Visit - NA ?Micro albumin-NA ?Hemoglobin A1c- 07/05/20 ? ?Star Rating Drugs: ?Rosuvastatin 10 mg-last fill 06/27/21 90 ds ?Ethelene Hal ?Clinical Pharmacist Assistant ?[phone]  ?

## 2021-08-09 ENCOUNTER — Ambulatory Visit: Payer: Self-pay | Admitting: *Deleted

## 2021-08-09 NOTE — Telephone Encounter (Signed)
Pt called in but would not tell agent what she was calling about.   ?Reason for Disposition ? MILD-MODERATE diarrhea (e.g., 1-6 times / day more than normal) ? ?Answer Assessment - Initial Assessment Questions ?1. DIARRHEA SEVERITY: "How bad is the diarrhea?" "How many more stools have you had in the past 24 hours than normal?"  ?  - NO DIARRHEA (SCALE 0) ?  - MILD (SCALE 1-3): Few loose or mushy BMs; increase of 1-3 stools over normal daily number of stools; mild increase in ostomy output. ?  -  MODERATE (SCALE 4-7): Increase of 4-6 stools daily over normal; moderate increase in ostomy output. ?* SEVERE (SCALE 8-10; OR 'WORST POSSIBLE'): Increase of 7 or more stools daily over normal; moderate increase in ostomy output; incontinence. ?    I'm having diarrhea and I don't know why.   ?2. ONSET: "When did the diarrhea begin?"  ?    2 days ago   ?3. BM CONSISTENCY: "How loose or watery is the diarrhea?"  ?    Watery    ? ?4. VOMITING: "Are you also vomiting?" If Yes, ask: "How many times in the past 24 hours?"  ?    No ?5. ABDOMINAL PAIN: "Are you having any abdominal pain?" If Yes, ask: "What does it feel like?" (e.g., crampy, dull, intermittent, constant)  ?    Having cramping ?6. ABDOMINAL PAIN SEVERITY: If present, ask: "How bad is the pain?"  (e.g., Scale 1-10; mild, moderate, or severe) ?  - MILD (1-3): doesn't interfere with normal activities, abdomen soft and not tender to touch  ?  - MODERATE (4-7): interferes with normal activities or awakens from sleep, abdomen tender to touch  ?  - SEVERE (8-10): excruciating pain, doubled over, unable to do any normal activities   ?    *No Answer* ?7. ORAL INTAKE: If vomiting, "Have you been able to drink liquids?" "How much liquids have you had in the past 24 hours?" ?    I drank a probiotic drink    ?My car is broke down so I don't have a way around.    ?8. HYDRATION: "Any signs of dehydration?" (e.g., dry mouth [not just dry lips], too weak to stand, dizziness, new  weight loss) "When did you last urinate?" ?    Denies dizziness or weakness ?9. EXPOSURE: "Have you traveled to a foreign country recently?" "Have you been exposed to anyone with diarrhea?" "Could you have eaten any food that was spoiled?" ?    No ?10. ANTIBIOTIC USE: "Are you taking antibiotics now or have you taken antibiotics in the past 2 months?" ?      Says she takes them all the time antibiotics ?Pt ate some oatmeal with nuts and raisins.    I don't drink water.   Ginger Ale is what I drink.    My son went to store on his bicycle to get me some stuff.    ?11. OTHER SYMPTOMS: "Do you have any other symptoms?" (e.g., fever, blood in stool) ?      No vomiting. ?12. PREGNANCY: "Is there any chance you are pregnant?" "When was your last menstrual period?" ?      N/A ? ?Protocols used: Diarrhea-A-AH ? ?

## 2021-08-09 NOTE — Telephone Encounter (Signed)
?  Chief Complaint: diarrhea for 2 days and having a sore bottom as a result.  One one episode of diarrhea today and feeling better. ?Symptoms: watery diarrhea for 2 days  ?Frequency: several times with only 1 episode of diarrhea today. ?Pertinent Negatives: Patient denies vomiting ?Disposition: '[]'$ ED /'[]'$ Urgent Care (no appt availability in office) / '[]'$ Appointment(In office/virtual)/ '[]'$  [place] [facility]/ '[x]'$ Home Care/ '[]'$ Refused Recommended Disposition /'[]'$ [place] [facility]/ '[]'$  Follow-up with PCP ?Additional Notes:  Pt mentioned she goes to the barn and smokes a "Doobie" which calms her down.   ?

## 2021-08-10 NOTE — Telephone Encounter (Signed)
Noted  

## 2021-08-10 NOTE — Telephone Encounter (Signed)
Spoke with patient to follow up with symptoms of Diarrhea. Patient says she is feeling a lot of better since she ate her some food. Patient states she is feeling better.  ?

## 2021-08-16 ENCOUNTER — Ambulatory Visit: Payer: Self-pay

## 2021-08-16 NOTE — Telephone Encounter (Signed)
Chief Complaint: Breast pain due to fall in the yard ?Symptoms: Soreness, pain to right breast ?Frequency: Started on yesterday ?Pertinent Negatives: Patient denies other symptoms ?Disposition: '[]'$ ED /'[]'$ Urgent Care (no appt availability in office) / '[]'$ Appointment(In office/virtual)/ '[]'$  [place] [facility]/ '[x]'$ Home Care/ '[]'$ Refused Recommended Disposition /'[]'$ [place] [facility]/ '[]'$  Follow-up with PCP ?Additional Notes: N/A ? ? ? ? ?Summary: fall / soreness under right breast  ? Pt fell yesterday and under her right breast is real sore/ no cuts or bruises / no other injuries / pt stated she tripped in the yard over a rock and her son wanted her to call the office and let them know/ please advise   ?  ? ?Reason for Disposition ? [1] Breast pain or tenderness AND [2] occurs monthly before menstrual period ? ?Answer Assessment - Initial Assessment Questions ?1. SYMPTOM: "What's the main symptom you're concerned about?"  (e.g., lump, pain, rash, nipple discharge) ?    Pain under right breast ?2. LOCATION: "Where is the pain located?" ?    Under right breast ?3. ONSET: "When did pain start?" ?    After the fall on yesterday ?4. PRIOR HISTORY: "Do you have any history of prior problems with your breasts?" (e.g., lumps, cancer, fibrocystic breast disease) ?    No ?5. CAUSE: "What do you think is causing this symptom?" ?    From the fall ?6. OTHER SYMPTOMS: "Do you have any other symptoms?" (e.g., fever, breast pain, redness or rash, nipple discharge) ?    None ?7. PREGNANCY-BREASTFEEDING: "Is there any chance you are pregnant?" "When was your last menstrual period?" "Are you breastfeeding?" ?    N/A ? ?Protocols used: Breast Symptoms-A-AH ? ?

## 2021-08-21 ENCOUNTER — Ambulatory Visit: Payer: Self-pay

## 2021-08-21 ENCOUNTER — Ambulatory Visit (INDEPENDENT_AMBULATORY_CARE_PROVIDER_SITE_OTHER): Payer: Medicare HMO | Admitting: Nurse Practitioner

## 2021-08-21 ENCOUNTER — Encounter: Payer: Self-pay | Admitting: Nurse Practitioner

## 2021-08-21 ENCOUNTER — Ambulatory Visit: Payer: Medicare HMO | Admitting: Nurse Practitioner

## 2021-08-21 VITALS — BP 105/65 | HR 74 | Temp 98.0°F | Ht 63.5 in | Wt 154.6 lb

## 2021-08-21 DIAGNOSIS — R1031 Right lower quadrant pain: Secondary | ICD-10-CM | POA: Insufficient documentation

## 2021-08-21 NOTE — Telephone Encounter (Signed)
? ?  Chief Complaint: Upper abdominal pain ?Symptoms: Right side at ribs ?Frequency: 1 week ?Pertinent Negatives: Patient denies vomiting ?Disposition: '[]'$ ED /'[]'$ Urgent Care (no appt availability in office) / '[x]'$ Appointment(In office/virtual)/ '[]'$  [place] [facility]/ '[]'$ Home Care/ '[]'$ Refused Recommended Disposition /'[]'$ [place] [facility]/ '[]'$  Follow-up with PCP ?Additional Notes:   ?Reason for Disposition ? [1] MODERATE pain (e.g., interferes with normal activities) AND [2] pain comes and goes (cramps) AND [3] present > 24 hours  (Exception: pain with Vomiting or Diarrhea - see that Guideline) ? ?Answer Assessment - Initial Assessment Questions ?1. LOCATION: "Where does it hurt?"  ?    Upper right ?2. RADIATION: "Does the pain shoot anywhere else?" (e.g., chest, back) ?    No ?3. ONSET: "When did the pain begin?" (e.g., minutes, hours or days ago)  ?    1 week ago ?4. SUDDEN: "Gradual or sudden onset?" ?    Gradual ?5. PATTERN "Does the pain come and go, or is it constant?" ?   - If constant: "Is it getting better, staying the same, or worsening?"  ?    (Note: Constant means the pain never goes away completely; most serious pain is constant and it progresses)  ?   - If intermittent: "How long does it last?" "Do you have pain now?" ?    (Note: Intermittent means the pain goes away completely between bouts) ?    Comes and goes ?6. SEVERITY: "How bad is the pain?"  (e.g., Scale 1-10; mild, moderate, or severe) ?  - MILD (1-3): doesn't interfere with normal activities, abdomen soft and not tender to touch  ?  - MODERATE (4-7): interferes with normal activities or awakens from sleep, abdomen tender to touch  ?  - SEVERE (8-10): excruciating pain, doubled over, unable to do any normal activities  ?    Moderate ?7. RECURRENT SYMPTOM: "Have you ever had this type of stomach pain before?" If Yes, ask: "When was the last time?" and "What happened that time?"  ?    No ?8. CAUSE: "What do you think is causing the stomach  pain?" ?    Unsure ?9. RELIEVING/AGGRAVATING FACTORS: "What makes it better or worse?" (e.g., movement, antacids, bowel movement) ?    No ?10. OTHER SYMPTOMS: "Do you have any other symptoms?" (e.g., back pain, diarrhea, fever, urination pain, vomiting) ?      No ?11. PREGNANCY: "Is there any chance you are pregnant?" "When was your last menstrual period?" ?      No ? ?Protocols used: Abdominal Pain - Female-A-AH ? ?

## 2021-08-21 NOTE — Patient Instructions (Signed)
Appendicitis, Adult ?The appendix is a tube in the body that is shaped like a finger. It is attached to the large intestine. Appendicitis means that this tube is swollen (inflamed). If this is not treated, the tube can tear. This can lead to a life-threatening infection. This condition can also cause pus to build up in the appendix. ?What are the causes? ?Something blocking the appendix, such as: ?A ball of poop (stool). ?Lymph glands that are bigger than normal. ?Injury to the belly (abdomen). ?Sometimes the cause is not known. ?What increases the risk? ?You are more likely to get this condition if you are 57-31 years old. ?What are the signs or symptoms? ?Pain or tenderness that starts around the belly button and: ?Moves toward the lower right belly. ?Gets worse with time. ?Gets worse if you cough. ?Gets worse if you make a sudden move. ?Vomiting or feeling like you may vomit (nauseous). ?Not wanting to eat as much as normal (loss of appetite). ?A fever. ?Trouble pooping (constipation). ?Watery poop (diarrhea). ?Feel generally sick (malaise). ?How is this treated? ?In general, this condition is treated with both antibiotic medicines and surgery to take out the appendix. If only antibiotics are given and the appendix is not taken out, there is a chance the condition could come back. ?There are two ways to take out the appendix: ?Open surgery. For this method, the appendix is taken out through a large cut. This cut is called an incision and is made in the lower right belly. This surgery may be used if: ?You have scars from another surgery. ?You have a bleeding condition. ?You are pregnant and will be having your baby soon. ?You have a condition that makes it hard to do the other type of surgery. ?Laparoscopic surgery. For this method, the appendix is taken out through small cuts. Often, this surgery: ?Causes less pain. ?Causes fewer problems. ?Heals faster. ?If your appendix tears and pus forms: ?A drain may be put  into the sore. The drain will be used to get rid of the pus. ?You may get an antibiotic through an IV tube. ?Your appendix may or may not need to be taken out. ?Follow these instructions at home: ?If you had surgery: ?Follow instructions from your doctor on how to: ?Care for yourself at home. ?Take care of your cut from surgery. ?Medicines ?Take over-the-counter and prescription medicines only as told by your doctor. ?If you were prescribed an antibiotic medicine, take it as told by your doctor. Do not stop taking it even if you start to feel better. ?If told, take steps to prevent problems with pooping (constipation). You may need to: ?Drink enough fluid to keep your pee (urine) pale yellow. ?Take medicines. You will be told what medicines to take. ?Eat foods that are high in fiber. These include beans, whole grains, and fresh fruits and vegetables. ?Limit foods that are high in fat and sugar. These include fried or sweet foods. ?Ask your doctor if you should avoid driving or using machines while you are taking your medicine. ?General instructions ?Follow instructions from your doctor about what you cannot eat or drink. ?Do not smoke or use any products that contain nicotine or tobacco. If you need help quitting, ask your doctor. ?Return to your normal activities when your doctor says that it is safe. ?Keep all follow-up visits. ?Contact a doctor if: ?There is pus, blood, or a lot of fluid coming from your cut or cuts from surgery. ?You feel like you may vomit,  or you vomit. ?You have a fever. ?You are very tired. ?You have muscle pain. ?Get help right away if: ?You have pain in your belly, and the pain is getting worse. ?You are short of breath. ?You cannot stop vomiting. ?These symptoms may be an emergency. Get help right away. Call your local emergency services (911 in the [place] ?Do not wait to see if the symptoms will go away. ?Do not drive yourself to the hospital. ?Summary ?Appendicitis is swelling of the  appendix. The appendix is a tube that is shaped like a finger. It is joined to the large intestine. ?This condition may be caused by something that blocks the appendix. This can lead to an infection. ?This condition is most often treated with both antibiotic medicines and taking out the appendix. ?This information is not intended to replace advice given to you by your health care provider. Make sure you discuss any questions you have with your health care provider. ?Document Revised: 10/27/2020 Document Reviewed: 10/27/2020 ?Elsevier Patient Education ? [employer]. ? ?

## 2021-08-21 NOTE — Progress Notes (Signed)
? ?BP 105/65   Pulse 74   Temp 98 ?F (36.7 ?C) (Oral)   Ht 5' 3.5" (1.613 m)   Wt 154 lb 9.6 oz (70.1 kg)   SpO2 96%   BMI 26.96 kg/m?   ? ?Subjective:  ? ? Patient ID: Tina Hardy, female    DOB: [birth year], 81 y.o.   MRN: [id number] ? ?HPI: ?Tina Hardy is a 81 y.o. female ? ?Chief Complaint  ?Patient presents with  ? Abdominal Pain  ?  Patient states she today she does not have any abdominal pain and then today she does not have any pain today. Patient declines taking medication to help with the pain or discomfort.   ? ?ABDOMINAL PAIN  ?Had episode of abdominal pain yesterday to RLQ.  "Thought I was going to die".  Lasted for several hours.  Then this morning woke-up and it was improved.  Was able to drink a cup of coffee.  She did not want to come in today because pain was improved, but son made her. ? ?Does endorse a fall last week, but reports no injuries or bruising with this, except to ego.  States she did have some tenderness to right lower ribs after, but this is improved. ?Duration: yesterday for several hours ?Onset: sudden ?Severity: 5/10 ?Quality: sharp, aching, and throbbing ?Location:  RLQ  ?Episode duration: several hours ?Radiation: no ?Frequency: x one episode ?Alleviating factors: took a probiotic which helped + Gatorade  ?Aggravating factors: unknown ?Status: better ?Treatments attempted:  as above ?Fever: no ?Nausea: no ?Vomiting: no ?Weight loss: no ?Decreased appetite:  varies ?Diarrhea: last week for 2 days ?Constipation: no ?Blood in stool: no ?Heartburn: no ?Jaundice: no ?Rash: no ?Dysuria/urinary frequency: no ?Hematuria: no ?History of sexually transmitted disease: no ?Recurrent NSAID use: no  ? ?Relevant past medical, surgical, family and social history reviewed and updated as indicated. Interim medical history since our last visit reviewed. ?Allergies and medications reviewed and updated. ? ?Review of Systems  ?Constitutional:  Negative for activity change, appetite  change, diaphoresis, fatigue and fever.  ?Respiratory:  Negative for cough, chest tightness and shortness of breath.   ?Cardiovascular:  Negative for chest pain, palpitations and leg swelling.  ?Gastrointestinal:  Positive for abdominal pain. Negative for abdominal distention, blood in stool, constipation, diarrhea, nausea and vomiting.  ?Endocrine: Negative.   ?Neurological: Negative.   ?Psychiatric/Behavioral: Negative.    ? ?Per HPI unless specifically indicated above ? ?   ?Objective:  ?  ?BP 105/65   Pulse 74   Temp 98 ?F (36.7 ?C) (Oral)   Ht 5' 3.5" (1.613 m)   Wt 154 lb 9.6 oz (70.1 kg)   SpO2 96%   BMI 26.96 kg/m?   ?Wt Readings from Last 3 Encounters:  ?08/21/21 154 lb 9.6 oz (70.1 kg)  ?06/08/21 156 lb (70.8 kg)  ?12/05/20 152 lb (68.9 kg)  ?  ?Physical Exam ?Vitals and nursing note reviewed.  ?Constitutional:   ?   General: She is awake. She is not in acute distress. ?   Appearance: She is well-developed and well-groomed. She is not ill-appearing.  ?HENT:  ?   Head: Normocephalic and atraumatic. Hair is normal.  ?   Right Ear: Hearing normal.  ?   Left Ear: Hearing normal.  ?Eyes:  ?   General: Lids are normal.  ?   Extraocular Movements: Extraocular movements intact.  ?   Pupils: Pupils are equal, round, and reactive to light.  ?Neck:  ?  Thyroid: No thyromegaly.  ?   Vascular: No carotid bruit or JVD.  ?Cardiovascular:  ?   Rate and Rhythm: Normal rate and regular rhythm.  ?   Heart sounds: Normal heart sounds. No murmur heard. ?  No gallop.  ?Pulmonary:  ?   Effort: Pulmonary effort is normal.  ?   Breath sounds: Normal breath sounds.  ?Abdominal:  ?   General: Bowel sounds are normal. There is no distension.  ?   Palpations: Abdomen is soft. There is no hepatomegaly or splenomegaly.  ?   Tenderness: There is abdominal tenderness in the right lower quadrant. There is no right CVA tenderness, left CVA tenderness, guarding or rebound. Negative signs include Rovsing's sign, McBurney's sign and  psoas sign.  ?   Hernia: No hernia is present.  ?   Comments: Mild tenderness to RLQ on exam.    ?Musculoskeletal:  ?   Cervical back: Normal range of motion and neck supple.  ?   Right lower leg: No edema.  ?   Left lower leg: No edema.  ?Lymphadenopathy:  ?   Cervical: No cervical adenopathy.  ?Skin: ?   General: Skin is warm and dry.  ?Neurological:  ?   Mental Status: She is alert and oriented to person, place, and time.  ?Psychiatric:     ?   Attention and Perception: Attention normal.     ?   Mood and Affect: Mood normal.     ?   Behavior: Behavior normal. Behavior is cooperative.     ?   Thought Content: Thought content normal.     ?   Judgment: Judgment normal.  ? ?Results for orders placed or performed in visit on 06/08/21  ?Comprehensive metabolic panel  ?Result Value Ref Range  ? Glucose 124 (H) 70 - 99 mg/dL  ? BUN 13 8 - 27 mg/dL  ? Creatinine, Ser 0.80 0.57 - 1.00 mg/dL  ? eGFR 75 >59 mL/min/1.73  ? BUN/Creatinine Ratio 16 12 - 28  ? Sodium 138 134 - 144 mmol/L  ? Potassium 4.1 3.5 - 5.2 mmol/L  ? Chloride 102 96 - 106 mmol/L  ? CO2 23 20 - 29 mmol/L  ? Calcium 9.7 8.7 - 10.3 mg/dL  ? Total Protein 6.5 6.0 - 8.5 g/dL  ? Albumin 4.1 3.7 - 4.7 g/dL  ? Globulin, Total 2.4 1.5 - 4.5 g/dL  ? Albumin/Globulin Ratio 1.7 1.2 - 2.2  ? Bilirubin Total 0.4 0.0 - 1.2 mg/dL  ? Alkaline Phosphatase 92 44 - 121 IU/L  ? AST 19 0 - 40 IU/L  ? ALT 17 0 - 32 IU/L  ?CBC with Differential/Platelet  ?Result Value Ref Range  ? WBC 7.9 3.4 - 10.8 x10E3/uL  ? RBC 4.71 3.77 - 5.28 x10E6/uL  ? Hemoglobin 14.1 11.1 - 15.9 g/dL  ? Hematocrit 43.2 34.0 - 46.6 %  ? MCV 92 79 - 97 fL  ? MCH 29.9 26.6 - 33.0 pg  ? MCHC 32.6 31.5 - 35.7 g/dL  ? RDW 11.8 11.7 - 15.4 %  ? Platelets 419 150 - 450 x10E3/uL  ? Neutrophils 66 Not Estab. %  ? Lymphs 26 Not Estab. %  ? Monocytes 6 Not Estab. %  ? Eos 1 Not Estab. %  ? Basos 1 Not Estab. %  ? Neutrophils Absolute 5.2 1.4 - 7.0 x10E3/uL  ? Lymphocytes Absolute 2.1 0.7 - 3.1 x10E3/uL  ? Monocytes  Absolute 0.4 0.1 - 0.9 x10E3/uL  ? EOS (  ABSOLUTE) 0.1 0.0 - 0.4 x10E3/uL  ? Basophils Absolute 0.1 0.0 - 0.2 x10E3/uL  ? Immature Granulocytes 0 Not Estab. %  ? Immature Grans (Abs) 0.0 0.0 - 0.1 x10E3/uL  ?Lipid Panel w/o Chol/HDL Ratio  ?Result Value Ref Range  ? Cholesterol, Total 115 100 - 199 mg/dL  ? Triglycerides 168 (H) 0 - 149 mg/dL  ? HDL 40 >39 mg/dL  ? VLDL Cholesterol Cal 28 5 - 40 mg/dL  ? LDL Chol Calc (NIH) 47 0 - 99 mg/dL  ?TSH  ?Result Value Ref Range  ? TSH 3.550 0.450 - 4.500 uIU/mL  ?VITAMIN D 25 Hydroxy (Vit-D Deficiency, Fractures)  ?Result Value Ref Range  ? Vit D, 25-Hydroxy 21.8 (L) 30.0 - 100.0 ng/mL  ?B12  ?Result Value Ref Range  ? Vitamin B-12 367 232 - 1,245 pg/mL  ? ?   ?Assessment & Plan:  ? ?Problem List Items Addressed This Visit   ? ?  ? Other  ? Colicky RLQ abdominal pain - Primary  ?  Acute, improved today, but concern for appendix related issue being present vs discomfort related to recent fall.  Is tender to right lower quadrant on exam.  At this time will obtain CT abd/pelvis to further assess appendix + get CBC and CMP today.  At this time is stable without pain, but if worsening or return of pain recommend she alert provider immediately OR go to ER.  Return in one week. ?  ?  ? Relevant Orders  ? CBC with Differential/Platelet  ? Comprehensive metabolic panel  ? CT Abdomen Pelvis Wo Contrast  ?  ? ?Follow up plan: ?Return in about 1 week (around 08/28/2021) for Abdominal Pain. ? ? ? ? ? ?

## 2021-08-21 NOTE — Assessment & Plan Note (Signed)
Acute, improved today, but concern for appendix related issue being present vs discomfort related to recent fall.  Is tender to right lower quadrant on exam.  At this time will obtain CT abd/pelvis to further assess appendix + get CBC and CMP today.  At this time is stable without pain, but if worsening or return of pain recommend she alert provider immediately OR go to ER.  Return in one week. ?

## 2021-08-22 LAB — COMPREHENSIVE METABOLIC PANEL
ALT: 18 IU/L (ref 0–32)
AST: 21 IU/L (ref 0–40)
Albumin/Globulin Ratio: 1.5 (ref 1.2–2.2)
Albumin: 4 g/dL (ref 3.7–4.7)
Alkaline Phosphatase: 93 IU/L (ref 44–121)
BUN/Creatinine Ratio: 18 (ref 12–28)
BUN: 14 mg/dL (ref 8–27)
Bilirubin Total: 0.5 mg/dL (ref 0.0–1.2)
CO2: 23 mmol/L (ref 20–29)
Calcium: 9.9 mg/dL (ref 8.7–10.3)
Chloride: 106 mmol/L (ref 96–106)
Creatinine, Ser: 0.79 mg/dL (ref 0.57–1.00)
Globulin, Total: 2.6 g/dL (ref 1.5–4.5)
Glucose: 84 mg/dL (ref 70–99)
Potassium: 4.7 mmol/L (ref 3.5–5.2)
Sodium: 144 mmol/L (ref 134–144)
Total Protein: 6.6 g/dL (ref 6.0–8.5)
eGFR: 76 mL/min/{1.73_m2} (ref >59)

## 2021-08-22 LAB — CBC WITH DIFFERENTIAL/PLATELET
Basophils Absolute: 0.1 10*3/uL (ref 0.0–0.2)
Basos: 1 %
EOS (ABSOLUTE): 0.1 10*3/uL (ref 0.0–0.4)
Eos: 2 %
Hematocrit: 41.2 % (ref 34.0–46.6)
Hemoglobin: 13.3 g/dL (ref 11.1–15.9)
Immature Grans (Abs): 0 10*3/uL (ref 0.0–0.1)
Immature Granulocytes: 0 %
Lymphocytes Absolute: 2.1 10*3/uL (ref 0.7–3.1)
Lymphs: 27 %
MCH: 29.7 pg (ref 26.6–33.0)
MCHC: 32.3 g/dL (ref 31.5–35.7)
MCV: 92 fL (ref 79–97)
Monocytes Absolute: 0.8 10*3/uL (ref 0.1–0.9)
Monocytes: 11 %
Neutrophils Absolute: 4.7 10*3/uL (ref 1.4–7.0)
Neutrophils: 59 %
Platelets: 405 10*3/uL (ref 150–450)
RBC: 4.48 x10E6/uL (ref 3.77–5.28)
RDW: 12.9 % (ref 11.7–15.4)
WBC: 7.7 10*3/uL (ref 3.4–10.8)

## 2021-08-22 NOTE — Progress Notes (Signed)
Good afternoon, please let Tina Hardy know her labs have returned and are all within normal ranges.  Please schedule CT scan, Evelena Peat spoke to you yesterday and provided number for you to schedule.  That way we can further check on appendix.  Any questions? ?Keep being stylin' and proflin' !!  Thank you for allowing me to participate in your care.  I appreciate you. ?Kindest regards, ?Sanaai Doane ?

## 2021-08-23 ENCOUNTER — Telehealth: Payer: Self-pay | Admitting: *Deleted

## 2021-08-23 ENCOUNTER — Ambulatory Visit
Admission: RE | Admit: 2021-08-23 | Discharge: 2021-08-23 | Disposition: A | Discharge status: Home / Self Care | Payer: Medicare HMO | Source: Ambulatory Visit | Attending: Nurse Practitioner | Admitting: Nurse Practitioner

## 2021-08-23 DIAGNOSIS — R109 Unspecified abdominal pain: Secondary | ICD-10-CM | POA: Diagnosis not present

## 2021-08-23 DIAGNOSIS — R1031 Right lower quadrant pain: Secondary | ICD-10-CM

## 2021-08-23 NOTE — Telephone Encounter (Signed)
?  Chief Complaint: CT with Contrast Instructions ?Symptoms: NA ?Frequency: NA ?Pertinent Negatives: Patient denies NA ?Disposition: '[]'$ ED /'[]'$ Urgent Care (no appt availability in office) / '[]'$ Appointment(In office/virtual)/ '[]'$  [facility]/ '[x]'$ Home Care/ '[]'$ Refused Recommended Disposition /'[]'$ [facility]/ '[]'$  Follow-up with PCP ?Additional Notes:  Pt did drink contrast as directed. Advised on appt time, to arrive early. Pt verbalizes understanding. ? ?

## 2021-08-23 NOTE — Telephone Encounter (Signed)
Pt calling for results of CT today, not released. States she received a phone call from practice but did not answer in time. Did not note call placed. ?

## 2021-08-24 NOTE — Progress Notes (Signed)
Please let Zula know her imaging has returned, appendix was not seen.  Has she had this out in past?  If not it could be related to her bladder being fairly empty during exam, which impeded ability to see some things.  There is no inflammation of of diverticulum.  Some cysts were noted to liver and to lower right lung, but these are considered non cancerous in appearance.  Any questions?  Please ensure to attend follow-up, especially if pain is ongoing and ensure regular bowel movements. ?Keep being awesome!!  Thank you for allowing me to participate in your care.  I appreciate you. ?Kindest regards, ?Bellagrace Sylvan ?

## 2021-08-24 NOTE — Telephone Encounter (Signed)
Patient was notified of [name]'s recommendations. Patient verbalized understanding and has no further questions at this time. 

## 2021-08-26 NOTE — Patient Instructions (Incomplete)
Abdominal Pain, Adult Many things can cause belly (abdominal) pain. Most times, belly pain is not dangerous. Many cases of belly pain can be watched and treated at home. Sometimes, though, belly pain is serious. Yourdoctor will try to find the cause of your belly pain. Follow these instructions at home:  Medicines Take over-the-counter and prescription medicines only as told by your doctor. Do not take medicines that help you poop (laxatives) unless told by your doctor. General instructions Watch your belly pain for any changes. Drink enough fluid to keep your pee (urine) pale yellow. Keep all follow-up visits as told by your doctor. This is important. Contact a doctor if: Your belly pain changes or gets worse. You are not hungry, or you lose weight without trying. You are having trouble pooping (constipated) or have watery poop (diarrhea) for more than 2-3 days. You have pain when you pee or poop. Your belly pain wakes you up at night. Your pain gets worse with meals, after eating, or with certain foods. You are vomiting and cannot keep anything down. You have a fever. You have blood in your pee. Get help right away if: Your pain does not go away as soon as your doctor says it should. You cannot stop vomiting. Your pain is only in areas of your belly, such as the right side or the left lower part of the belly. You have bloody or black poop, or poop that looks like tar. You have very bad pain, cramping, or bloating in your belly. You have signs of not having enough fluid or water in your body (dehydration), such as: Dark pee, very little pee, or no pee. Cracked lips. Dry mouth. Sunken eyes. Sleepiness. Weakness. You have trouble breathing or chest pain. Summary Many cases of belly pain can be watched and treated at home. Watch your belly pain for any changes. Take over-the-counter and prescription medicines only as told by your doctor. Contact a doctor if your belly pain  changes or gets worse. Get help right away if you have very bad pain, cramping, or bloating in your belly. This information is not intended to replace advice given to you by your health care provider. Make sure you discuss any questions you have with your healthcare provider. Document Revised: 09/15/2018 Document Reviewed: 09/15/2018 Elsevier Patient Education  2022 [employer]  

## 2021-08-28 ENCOUNTER — Ambulatory Visit: Payer: Medicaid Other | Admitting: Nurse Practitioner

## 2021-08-30 ENCOUNTER — Ambulatory Visit (INDEPENDENT_AMBULATORY_CARE_PROVIDER_SITE_OTHER): Payer: Medicare HMO

## 2021-08-30 ENCOUNTER — Telehealth: Payer: Medicaid Other

## 2021-08-30 DIAGNOSIS — F418 Other specified anxiety disorders: Secondary | ICD-10-CM

## 2021-08-30 DIAGNOSIS — I10 Essential (primary) hypertension: Secondary | ICD-10-CM

## 2021-08-30 DIAGNOSIS — E782 Mixed hyperlipidemia: Secondary | ICD-10-CM

## 2021-08-30 NOTE — Chronic Care Management (AMB) (Signed)
?Chronic Care Management  ? ?CCM RN Visit Note ? ?08/30/2021 ?Name: Tina Hardy MRN: [id number] DOB: [birth year] ? ?Subjective: ?Tina Hardy is a 81 y.o. year old female who is a primary care patient of Cannady, Barbaraann Faster, NP. The care management team was consulted for assistance with disease management and care coordination needs.   ? ?Engaged with patient by telephone for follow up visit in response to provider referral for case management and/or care coordination services.  ? ?Consent to Services:  ?The patient was given information about Chronic Care Management services, agreed to services, and gave verbal consent prior to initiation of services.  Please see initial visit note for detailed documentation.  ? ?Patient agreed to services and verbal consent obtained.  ? ?Assessment: Review of patient past medical history, allergies, medications, health status, including review of consultants reports, laboratory and other test data, was performed as part of comprehensive evaluation and provision of chronic care management services.  ? ?SDOH (Social Determinants of Health) assessments and interventions performed:   ? ?CCM Care Plan ? ?Allergies  ?Allergen Reactions  ? Codeine Other (See Comments)  ?  Unknown reaction   ? Penicillins Other (See Comments)  ?  Unknown reaction  ?Has patient had a PCN reaction causing immediate rash, facial/tongue/throat swelling, SOB or lightheadedness with hypotension: n/a ?Has patient had a PCN reaction causing severe rash involving mucus membranes or skin necrosis: n/a ?Has patient had a PCN reaction that required hospitalization: n/a ?Has patient had a PCN reaction occurring within the last 10 years: n/a ?If all of the above answers are "NO", then may proceed with Cephalosporin use. ?  ? ? ?Outpatient Encounter Medications as of 08/30/2021  ?Medication Sig  ? amLODipine (NORVASC) 5 MG tablet Take 1 tablet (5 mg total) by mouth daily.  ? aspirin EC 81 MG EC tablet Take 1 tablet  (81 mg total) by mouth daily.  ? busPIRone (BUSPAR) 5 MG tablet Take 1 tablet (5 mg total) by mouth 2 (two) times daily.  ? Cholecalciferol (VITAMIN D3) 50 MCG (2000 UT) capsule Take 1 capsule (2,000 Units total) by mouth daily.  ? citalopram (CELEXA) 10 MG tablet Take 1 tablet (10 mg total) by mouth daily.  ? clopidogrel (PLAVIX) 75 MG tablet Take 1 tablet (75 mg total) by mouth daily at 6 (six) AM.  ? rosuvastatin (CRESTOR) 10 MG tablet Take 1 tablet (10 mg total) by mouth daily.  ? vitamin B-12 (CYANOCOBALAMIN) 1000 MCG tablet Take 1 tablet (1,000 mcg total) by mouth daily.  ? ?No facility-administered encounter medications on file as of 08/30/2021.  ? ? ?Patient Active Problem List  ? Diagnosis Date Noted  ? Colicky RLQ abdominal pain 08/21/2021  ? Vitamin D deficiency 06/09/2021  ? Vitamin B12 deficiency 06/09/2021  ? DDD (degenerative disc disease), lumbar 06/18/2020  ? Aortic atherosclerosis ([place]) 06/18/2020  ? History of cerebellar stroke 06/18/2020  ? Elevated hemoglobin A1c 05/26/2019  ? Aortic valve sclerosis 04/24/2019  ? Situational anxiety 04/24/2019  ? Stenosis of left carotid artery 03/23/2019  ? Insomnia 03/23/2019  ? Marijuana use 03/23/2019  ? Hypertension 03/22/2019  ? Hyperlipidemia 03/22/2019  ? Ludwig's angina 07/04/2017  ? Hepatic hemangioma 07/16/2012  ? Diverticulosis of colon 06/22/2008  ? ? ?Conditions to be addressed/monitored:HTN, HLD, Anxiety, and Falls prevention and safety ? ?Care Plan : [name]: General Plan of Care (Adult) for Chronic Disease Management and Care Coordination Needs  ?Updates made by Vanita Ingles, RN since 08/30/2021 12:00 AM  ?  ? ?  Problem: [name]: Development of a plan of care for Chronic Disease Management (HTN, HLD, Anxiety, Substance use, Insomnia)   ?Priority: High  ?  ? ?Long-Range Goal: [name]: Development of a plan of care for Chronic Disease Management (HTN, HLD, Anxiety, Substance use, Insomnia)   ?Start Date: 03/08/2021  ?Expected End Date: 03/08/2022   ?Priority: High  ?Note:   ?Current Barriers:  ?Knowledge Deficits related to plan of care for management of HTN, HLD, Anxiety with Excessive Worry, ?Panic Symptoms, ?Social Anxiety, ?Specific Phobias,, and cannibals use and insomnia  ?Care Coordination needs related to Mental Health Concerns , Family and relationship dysfunction, and Substance abuse issues -  cannibals use  ?Chronic Disease Management support and education needs related to HTN, HLD, Anxiety with Excessive Worry, ?Panic Symptoms, ?Social Anxiety, ?Specific Phobias,, Panic Disorder , and Insomnia/Sleep Difficulties, and cannibals use and insomnia ?Lacks caregiver support.  ?Film/video editor.  ?Transportation barriers ?Non-adherence to scheduled provider appointments ? ?[name] Clinical Goal(s):  ?Patient will verbalize understanding of plan for management of HTN, HLD, Anxiety, and cannibals use and insomnia  ?verbalize basic understanding of HTN, HLD, Anxiety, and cannibals use and insomnia disease process and self health management plan  ?take all medications exactly as prescribed and will call provider for medication related questions ?demonstrate understanding of rationale for each prescribed medication  ?attend all scheduled medical appointments: 12-06-2021 at 0900 am ?demonstrate improved and ongoing health management independence for effective management of chronic diseases and maintain health and well being ?continue to work with Consulting civil engineer and/or Social Worker to address care management and care coordination needs related to HTN, HLD, Anxiety, and cannibals  use and insomnia  ?work with Education officer, museum to address Level of care concerns, Mental Health Concerns , Family and relationship dysfunction, Substance abuse issues -  cannibals , and Social Isolation related to the management of HTN, HLD, Anxiety, and cannibals use and insomnia  ?demonstrate a decrease in HTN, HLD, Anxiety, and cannibals use and insomnia exacerbations  ?demonstrate  ongoing self health care management ability  through collaboration with RN Care manager, provider, and care team.  ? ?Interventions: ?1:1 collaboration with primary care provider regarding development and update of comprehensive plan of care as evidenced by provider attestation and co-signature ?Inter-disciplinary care team collaboration (see longitudinal plan of care) ?Evaluation of current treatment plan related to  self management and patient's adherence to plan as established by provider ? ? ?SDOH Barriers (Status: Goal on track: YES.)  ?Patient interviewed and SDOH assessment performed ?       ?Patient interviewed and appropriate assessments performed ?Provided patient with information about resources available and support for the patient to maintain her health and well being ?Discussed plans with patient for ongoing care management follow up and provided patient with direct contact information for care management team ?Advised patient to call the office for changes in SDOH, new concerns or questions ?Provided education to patient/caregiver regarding level of care options. ?08-30-2021: The patient states that the key to her care broke and she has to pay 300.00 to get a new key and she cannot afford. Denies any transportation needs at this time. Knows to call the [facility] for new concerns or needs. ? ? ? ?Anxiety  (Status: Goal on track: YES.) ?Evaluation of current treatment plan related to Anxiety, Mental Health Concerns , Family and relationship dysfunction, Substance abuse issues -  cannibals use, and Social Isolation self-management and patient's adherence to plan as established by provider. 06-28-2021: The patient states that  she is doing well and denies any acute distress. The patient says she has not been able to afford her Vitamins, but she will get when she can. She is happy today and talkative. She denies new concerns. 08-30-2021: The patient states that she has a "cold" and has been really sick the last  couple of days. She says that she feels better today but is staying in the bed. Her son is there with her and she is drinking plenty of liquids. She denies any acute distress and feels she will be just fine. Educatio

## 2021-08-30 NOTE — Patient Instructions (Signed)
Visit Information ? ?Thank you for taking time to visit with me today. Please don't hesitate to contact me if I can be of assistance to you before our next scheduled telephone appointment. ? ?Following are the goals we discussed today:  ?[name] Clinical Goal(s):  ?Patient will verbalize understanding of plan for management of HTN, HLD, Anxiety, and cannibals use and insomnia  ?verbalize basic understanding of HTN, HLD, Anxiety, and cannibals use and insomnia disease process and self health management plan  ?take all medications exactly as prescribed and will call provider for medication related questions ?demonstrate understanding of rationale for each prescribed medication  ?attend all scheduled medical appointments: 12-06-2021 at 0900 am ?demonstrate improved and ongoing health management independence for effective management of chronic diseases and maintain health and well being ?continue to work with Consulting civil engineer and/or Social Worker to address care management and care coordination needs related to HTN, HLD, Anxiety, and cannibals  use and insomnia  ?work with Education officer, museum to address Level of care concerns, Mental Health Concerns , Family and relationship dysfunction, Substance abuse issues -  cannibals , and Social Isolation related to the management of HTN, HLD, Anxiety, and cannibals use and insomnia  ?demonstrate a decrease in HTN, HLD, Anxiety, and cannibals use and insomnia exacerbations  ?demonstrate ongoing self health care management ability  through collaboration with RN Care manager, provider, and care team.  ?  ?Interventions: ?1:1 collaboration with primary care provider regarding development and update of comprehensive plan of care as evidenced by provider attestation and co-signature ?Inter-disciplinary care team collaboration (see longitudinal plan of care) ?Evaluation of current treatment plan related to  self management and patient's adherence to plan as established by provider ?  ?  ?SDOH  Barriers (Status: Goal on track: YES.)  ?Patient interviewed and SDOH assessment performed ?       ?Patient interviewed and appropriate assessments performed ?Provided patient with information about resources available and support for the patient to maintain her health and well being ?Discussed plans with patient for ongoing care management follow up and provided patient with direct contact information for care management team ?Advised patient to call the office for changes in SDOH, new concerns or questions ?Provided education to patient/caregiver regarding level of care options. ?08-30-2021: The patient states that the key to her care broke and she has to pay 300.00 to get a new key and she cannot afford. Denies any transportation needs at this time. Knows to call the [facility] for new concerns or needs. ?  ?  ?  ?Anxiety  (Status: Goal on track: YES.) ?Evaluation of current treatment plan related to Anxiety, Mental Health Concerns , Family and relationship dysfunction, Substance abuse issues -  cannibals use, and Social Isolation self-management and patient's adherence to plan as established by provider. 06-28-2021: The patient states that she is doing well and denies any acute distress. The patient says she has not been able to afford her Vitamins, but she will get when she can. She is happy today and talkative. She denies new concerns. 08-30-2021: The patient states that she has a "cold" and has been really sick the last couple of days. She says that she feels better today but is staying in the bed. Her son is there with her and she is drinking plenty of liquids. She denies any acute distress and feels she will be just fine. Education provided on calling the office for worsening sx and sx or got to urgent care or ER for emergent  changes. The patient verbalized understanding. States she does not have a working car. Offered to get an appointment to see the pcp but the patient declined. She denies transportation needs at  this time. Will continue to monitor for changes. States that she is not having any additional abdominal pain at this time.  ?Discussed plans with patient for ongoing care management follow up and provided patient with direct contact information for care management team ?Advised patient to call the office for changes in mood, anxiety, or depression; ?Provided education to patient re: diversional activities to help when she is feeling anxious.  The patient reports that she is going to the gym every day that she can get transportation and that she is walking a mile and she is also working on the machines. She plans on doing water aerobics in the future. She states this really helps her with her socialization and [facility]  The patient also enjoys reading her Bible. This is a great source of strength for her; ?Provided patient with stress reduction educational materials related to preventing exacerbation of anxiety; ?Reviewed scheduled/upcoming provider appointments including 12-06-2021 at 0900 am; ?Social Work referral for the patient has ongoing support from the LCSW for help with anxiety and other health concerns; ?Discussed plans with patient for ongoing care management follow up and provided patient with direct contact information for care management team; ?Advised patient to discuss changes in health and when she feels her anxiety is not well controlled  with provider; ?Screening for signs and symptoms of depression related to chronic disease state;  ?Assessed social determinant of health barriers;  ?  ?Insomnia  (Status: Condition stable. Not addressed this visit.) ?Evaluation of current treatment plan related to  Insomnia ,  self-management and patient's adherence to plan as established by provider. 06-28-2021: Her sleeping patterns come and go. She states she feels she is getting enough rest.  ?Discussed plans with patient for ongoing care management follow up and provided patient with direct  contact information for care management team ?Advised patient to call the office for changes in her insomnia or other concerns preventing her from sleeping well; ?Provided education to patient re: practicing good sleep hygiene, creating a bedtime routine and taking medications as needed for restful sleep; ?Reviewed medications with patient and discussed the patient states the Trazadone is working well for her and she takes it when she needs it. States she is not using every night; ?Provided patient with mindufullness and sleep educational materials related to insomnia; ?Reviewed scheduled/upcoming provider appointments including 12-06-2021 at 0900 am; ?Discussed plans with patient for ongoing care management follow up and provided patient with direct contact information for care management team;  ?  ?Cannibals use  (Status: Goal on track: YES.) ?Evaluation of current treatment plan related to  Cannibals use , Substance abuse issues -  cannibals  self-management and patient's adherence to plan as established by provider. 08-30-2021: Is using occasionally. Encouraged cessation of use.  ?Discussed plans with patient for ongoing care management follow up and provided patient with direct contact information for care management team ?Advised patient to consider other methods of dealing with her anxiety and life circumstance instead of using cannibals; ?Provided education to patient re: the risk of illicit drug use on body system and the effects of cannibals use; ?Reviewed scheduled/upcoming provider appointments including 12-06-2021;  ?  ?Hyperlipidemia:  (Status: Goal on track: YES.) ?     ?Lab Results  ?Component Value Date  ?  CHOL 115 06/08/2021  ?  HDL 40 06/08/2021  ?  [place] 47 06/08/2021  ?  TRIG 168 (H) 06/08/2021  ?  CHOLHDL 5.2 02/16/2019  ?  ?  ?Medication review performed; medication list updated in electronic medical record. 08-30-2021: The patient is compliant with Crestor 10 mg QD. ?Provider established  cholesterol goals reviewed. 08-30-2021: Review and education given; ?Counseled on importance of regular laboratory monitoring as prescribed. 08-30-2021: Has on a consistent basis most recent in January of 2023;

## 2021-09-17 DIAGNOSIS — E785 Hyperlipidemia, unspecified: Secondary | ICD-10-CM | POA: Diagnosis not present

## 2021-09-17 DIAGNOSIS — I10 Essential (primary) hypertension: Secondary | ICD-10-CM | POA: Diagnosis not present

## 2021-09-27 ENCOUNTER — Ambulatory Visit (INDEPENDENT_AMBULATORY_CARE_PROVIDER_SITE_OTHER): Payer: Medicare HMO

## 2021-09-27 ENCOUNTER — Telehealth: Payer: Medicaid Other

## 2021-09-27 DIAGNOSIS — F1290 Cannabis use, unspecified, uncomplicated: Secondary | ICD-10-CM

## 2021-09-27 DIAGNOSIS — E782 Mixed hyperlipidemia: Secondary | ICD-10-CM

## 2021-09-27 DIAGNOSIS — F418 Other specified anxiety disorders: Secondary | ICD-10-CM

## 2021-09-27 DIAGNOSIS — I10 Essential (primary) hypertension: Secondary | ICD-10-CM

## 2021-09-27 DIAGNOSIS — G4700 Insomnia, unspecified: Secondary | ICD-10-CM
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
## 2021-09-27 NOTE — Chronic Care Management (AMB) (Signed)
?Chronic Care Management  ? ?CCM RN Visit Note ? ?09/27/2021 ?Name: Tina Hardy MRN: [id number] DOB: 1940/08/09 ? ?Subjective: ?Tina Hardy is a 81 y.o. year old female who is a primary care patient of Cannady, Barbaraann Faster, NP. The care management team was consulted for assistance with disease management and care coordination needs.   ? ?Engaged with patient by telephone for follow up visit in response to provider referral for case management and/or care coordination services.  ? ?Consent to Services:  ?The patient was given information about Chronic Care Management services, agreed to services, and gave verbal consent prior to initiation of services.  Please see initial visit note for detailed documentation.  ? ?Patient agreed to services and verbal consent obtained.  ? ?Assessment: Review of patient past medical history, allergies, medications, health status, including review of consultants reports, laboratory and other test data, was performed as part of comprehensive evaluation and provision of chronic care management services.  ? ?SDOH (Social Determinants of Health) assessments and interventions performed:   ? ?CCM Care Plan ? ?Allergies  ?Allergen Reactions  ? Codeine Other (See Comments)  ?  Unknown reaction   ? Penicillins Other (See Comments)  ?  Unknown reaction  ?Has patient had a PCN reaction causing immediate rash, facial/tongue/throat swelling, SOB or lightheadedness with hypotension: n/a ?Has patient had a PCN reaction causing severe rash involving mucus membranes or skin necrosis: n/a ?Has patient had a PCN reaction that required hospitalization: n/a ?Has patient had a PCN reaction occurring within the last 10 years: n/a ?If all of the above answers are "NO", then may proceed with Cephalosporin use. ?  ? ? ?Outpatient Encounter Medications as of 09/27/2021  ?Medication Sig  ? amLODipine (NORVASC) 5 MG tablet Take 1 tablet (5 mg total) by mouth daily.  ? aspirin EC 81 MG EC tablet Take 1 tablet  (81 mg total) by mouth daily.  ? busPIRone (BUSPAR) 5 MG tablet Take 1 tablet (5 mg total) by mouth 2 (two) times daily.  ? Cholecalciferol (VITAMIN D3) 50 MCG (2000 UT) capsule Take 1 capsule (2,000 Units total) by mouth daily.  ? citalopram (CELEXA) 10 MG tablet Take 1 tablet (10 mg total) by mouth daily.  ? clopidogrel (PLAVIX) 75 MG tablet Take 1 tablet (75 mg total) by mouth daily at 6 (six) AM.  ? rosuvastatin (CRESTOR) 10 MG tablet Take 1 tablet (10 mg total) by mouth daily.  ? vitamin B-12 (CYANOCOBALAMIN) 1000 MCG tablet Take 1 tablet (1,000 mcg total) by mouth daily.  ? ?No facility-administered encounter medications on file as of 09/27/2021.  ? ? ?Patient Active Problem List  ? Diagnosis Date Noted  ? Colicky RLQ abdominal pain 08/21/2021  ? Vitamin D deficiency 06/09/2021  ? Vitamin B12 deficiency 06/09/2021  ? DDD (degenerative disc disease), lumbar 06/18/2020  ? Aortic atherosclerosis ([place]) 06/18/2020  ? History of cerebellar stroke 06/18/2020  ? Elevated hemoglobin A1c 05/26/2019  ? Aortic valve sclerosis 04/24/2019  ? Situational anxiety 04/24/2019  ? Stenosis of left carotid artery 03/23/2019  ? Insomnia 03/23/2019  ? Marijuana use 03/23/2019  ? Hypertension 03/22/2019  ? Hyperlipidemia 03/22/2019  ? Ludwig's angina 07/04/2017  ? Hepatic hemangioma 07/16/2012  ? Diverticulosis of colon 06/22/2008  ? ? ?Conditions to be addressed/monitored:HTN, HLD, Anxiety, and Insomnia and substance abuse ? ?Care Plan : [name]: General Plan of Care (Adult) for Chronic Disease Management and Care Coordination Needs  ?Updates made by Vanita Ingles, RN since 09/27/2021 12:00 AM  ?  ? ?  Problem: [name]: Development of a plan of care for Chronic Disease Management (HTN, HLD, Anxiety, Substance use, Insomnia)   ?Priority: High  ?  ? ?Long-Range Goal: [name]: Development of a plan of care for Chronic Disease Management (HTN, HLD, Anxiety, Substance use, Insomnia)   ?Start Date: 03/08/2021  ?Expected End Date: 03/08/2022   ?Priority: High  ?Note:   ?Current Barriers:  ?Knowledge Deficits related to plan of care for management of HTN, HLD, Anxiety with Excessive Worry, ?Panic Symptoms, ?Social Anxiety, ?Specific Phobias,, and cannibals use and insomnia  ?Care Coordination needs related to Mental Health Concerns , Family and relationship dysfunction, and Substance abuse issues -  cannibals use  ?Chronic Disease Management support and education needs related to HTN, HLD, Anxiety with Excessive Worry, ?Panic Symptoms, ?Social Anxiety, ?Specific Phobias,, Panic Disorder , and Insomnia/Sleep Difficulties, and cannibals use and insomnia ?Lacks caregiver support.  ?Film/video editor.  ?Transportation barriers ?Non-adherence to scheduled provider appointments ? ?[name] Clinical Goal(s):  ?Patient will verbalize understanding of plan for management of HTN, HLD, Anxiety, and cannibals use and insomnia  ?verbalize basic understanding of HTN, HLD, Anxiety, and cannibals use and insomnia disease process and self health management plan  ?take all medications exactly as prescribed and will call provider for medication related questions ?demonstrate understanding of rationale for each prescribed medication  ?attend all scheduled medical appointments: 12-06-2021 at 0900 am ?demonstrate improved and ongoing health management independence for effective management of chronic diseases and maintain health and well being ?continue to work with Consulting civil engineer and/or Social Worker to address care management and care coordination needs related to HTN, HLD, Anxiety, and cannibals  use and insomnia  ?work with Education officer, museum to address Level of care concerns, Mental Health Concerns , Family and relationship dysfunction, Substance abuse issues -  cannibals , and Social Isolation related to the management of HTN, HLD, Anxiety, and cannibals use and insomnia  ?demonstrate a decrease in HTN, HLD, Anxiety, and cannibals use and insomnia exacerbations  ?demonstrate  ongoing self health care management ability  through collaboration with RN Care manager, provider, and care team.  ? ?Interventions: ?1:1 collaboration with primary care provider regarding development and update of comprehensive plan of care as evidenced by provider attestation and co-signature ?Inter-disciplinary care team collaboration (see longitudinal plan of care) ?Evaluation of current treatment plan related to  self management and patient's adherence to plan as established by provider ? ? ?SDOH Barriers (Status: Goal on track: YES.)  ?Patient interviewed and SDOH assessment performed ?       ?Patient interviewed and appropriate assessments performed ?Provided patient with information about resources available and support for the patient to maintain her health and well being ?Discussed plans with patient for ongoing care management follow up and provided patient with direct contact information for care management team ?Advised patient to call the office for changes in SDOH, new concerns or questions ?Provided education to patient/caregiver regarding level of care options. ?08-30-2021: The patient states that the key to her care broke and she has to pay 300.00 to get a new key and she cannot afford. Denies any transportation needs at this time. Knows to call the [facility] for new concerns or needs. ? ? ? ?Anxiety  (Status: Goal on track: YES.) ?Evaluation of current treatment plan related to Anxiety, Mental Health Concerns , Family and relationship dysfunction, Substance abuse issues -  cannibals use, and Social Isolation self-management and patient's adherence to plan as established by provider. 06-28-2021: The patient states that  she is doing well and denies any acute distress. The patient says she has not been able to afford her Vitamins, but she will get when she can. She is happy today and talkative. She denies new concerns. 08-30-2021: The patient states that she has a "cold" and has been really sick the last  couple of days. She says that she feels better today but is staying in the bed. Her son is there with her and she is drinking plenty of liquids. She denies any acute distress and feels she will be just fine. Educati

## 2021-10-18 DIAGNOSIS — Z87891 Personal history of nicotine dependence: Secondary | ICD-10-CM

## 2021-10-18 DIAGNOSIS — I10 Essential (primary) hypertension: Secondary | ICD-10-CM | POA: Diagnosis not present

## 2021-10-18 DIAGNOSIS — E782 Mixed hyperlipidemia: Secondary | ICD-10-CM | POA: Diagnosis not present

## 2021-10-30 ENCOUNTER — Ambulatory Visit: Payer: Medicare HMO

## 2021-11-06 ENCOUNTER — Ambulatory Visit (INDEPENDENT_AMBULATORY_CARE_PROVIDER_SITE_OTHER): Payer: Medicare HMO | Admitting: *Deleted

## 2021-11-06 DIAGNOSIS — Z0000 Encounter for general adult medical examination without abnormal findings: Secondary | ICD-10-CM | POA: Diagnosis not present
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
## 2021-11-06 NOTE — Patient Instructions (Signed)
[name] , Thank you for taking time to come for your Medicare Wellness Visit. I appreciate your ongoing commitment to your health goals. Please review the following plan we discussed and let me know if I can assist you in the future.   Screening recommendations/referrals: Colonoscopy: Education provided Mammogram: declined Bone Density: declined Recommended yearly ophthalmology/optometry visit for glaucoma screening and checkup Recommended yearly dental visit for hygiene and checkup  Vaccinations: Influenza vaccine: declined Pneumococcal vaccine: declined Tdap vaccine: Education provided Shingles vaccine: declined    Advanced directives: no   Conditions/risks identified:   Next appointment: 12-06-2021 @   9:00 Cannady   Preventive Care 65 Years and Older, Female Preventive care refers to lifestyle choices and visits with your health care provider that can promote health and wellness. What does preventive care include? A yearly physical exam. This is also called an annual well check. Dental exams once or twice a year. Routine eye exams. Ask your health care provider how often you should have your eyes checked. Personal lifestyle choices, including: Daily care of your teeth and gums. Regular physical activity. Eating a healthy diet. Avoiding tobacco and drug use. Limiting alcohol use. Practicing safe sex. Taking low-dose aspirin every day. Taking vitamin and mineral supplements as recommended by your health care provider. What happens during an annual well check? The services and screenings done by your health care provider during your annual well check will depend on your age, overall health, lifestyle risk factors, and family history of disease. Counseling  Your health care provider may ask you questions about your: Alcohol use. Tobacco use. Drug use. Emotional well-being. Home and relationship well-being. Sexual activity. Eating habits. History of falls. Memory  and ability to understand (cognition). Work and work Statistician. Reproductive health. Screening  You may have the following tests or measurements: Height, weight, and BMI. Blood pressure. Lipid and cholesterol levels. These may be checked every 5 years, or more frequently if you are over 88 years old. Skin check. Lung cancer screening. You may have this screening every year starting at age 91 if you have a 30-pack-year history of smoking and currently smoke or have quit within the past 15 years. Fecal occult blood test (FOBT) of the stool. You may have this test every year starting at age 21. Flexible sigmoidoscopy or colonoscopy. You may have a sigmoidoscopy every 5 years or a colonoscopy every 10 years starting at age 54. Hepatitis C blood test. Hepatitis B blood test. Sexually transmitted disease (STD) testing. Diabetes screening. This is done by checking your blood sugar (glucose) after you have not eaten for a while (fasting). You may have this done every 1-3 years. Bone density scan. This is done to screen for osteoporosis. You may have this done starting at age 7. Mammogram. This may be done every 1-2 years. Talk to your health care provider about how often you should have regular mammograms. Talk with your health care provider about your test results, treatment options, and if necessary, the need for more tests. Vaccines  Your health care provider may recommend certain vaccines, such as: Influenza vaccine. This is recommended every year. Tetanus, diphtheria, and acellular pertussis (Tdap, Td) vaccine. You may need a Td booster every 10 years. Zoster vaccine. You may need this after age 22. Pneumococcal 13-valent conjugate (PCV13) vaccine. One dose is recommended after age 97. Pneumococcal polysaccharide (PPSV23) vaccine. One dose is recommended after age 70. Talk to your health care provider about which screenings and vaccines you need and how  often you need them. This  information is not intended to replace advice given to you by your health care provider. Make sure you discuss any questions you have with your health care provider. Document Released: 06/03/2015 Document Revised: 01/25/2016 Document Reviewed: 03/08/2015 Elsevier Interactive Patient Education  2017 [place] Prevention in the Home Falls can cause injuries. They can happen to people of all ages. There are many things you can do to make your home safe and to help prevent falls. What can I do on the outside of my home? Regularly fix the edges of walkways and driveways and fix any cracks. Remove anything that might make you trip as you walk through a door, such as a raised step or threshold. Trim any bushes or trees on the path to your home. Use bright outdoor lighting. Clear any walking paths of anything that might make someone trip, such as rocks or tools. Regularly check to see if handrails are loose or broken. Make sure that both sides of any steps have handrails. Any raised decks and porches should have guardrails on the edges. Have any leaves, snow, or ice cleared regularly. Use sand or salt on walking paths during winter. Clean up any spills in your garage right away. This includes oil or grease spills. What can I do in the bathroom? Use night lights. Install grab bars by the toilet and in the tub and shower. Do not use towel bars as grab bars. Use non-skid mats or decals in the tub or shower. If you need to sit down in the shower, use a plastic, non-slip stool. Keep the floor dry. Clean up any water that spills on the floor as soon as it happens. Remove soap buildup in the tub or shower regularly. Attach bath mats securely with double-sided non-slip rug tape. Do not have throw rugs and other things on the floor that can make you trip. What can I do in the bedroom? Use night lights. Make sure that you have a light by your bed that is easy to reach. Do not use any sheets or  blankets that are too big for your bed. They should not hang down onto the floor. Have a firm chair that has side arms. You can use this for support while you get dressed. Do not have throw rugs and other things on the floor that can make you trip. What can I do in the kitchen? Clean up any spills right away. Avoid walking on wet floors. Keep items that you use a lot in easy-to-reach places. If you need to reach something above you, use a strong step stool that has a grab bar. Keep electrical cords out of the way. Do not use floor polish or wax that makes floors slippery. If you must use wax, use non-skid floor wax. Do not have throw rugs and other things on the floor that can make you trip. What can I do with my stairs? Do not leave any items on the stairs. Make sure that there are handrails on both sides of the stairs and use them. Fix handrails that are broken or loose. Make sure that handrails are as long as the stairways. Check any carpeting to make sure that it is firmly attached to the stairs. Fix any carpet that is loose or worn. Avoid having throw rugs at the top or bottom of the stairs. If you do have throw rugs, attach them to the floor with carpet tape. Make sure that you have a  light switch at the top of the stairs and the bottom of the stairs. If you do not have them, ask someone to add them for you. What else can I do to help prevent falls? Wear shoes that: Do not have high heels. Have rubber bottoms. Are comfortable and fit you well. Are closed at the toe. Do not wear sandals. If you use a stepladder: Make sure that it is fully opened. Do not climb a closed stepladder. Make sure that both sides of the stepladder are locked into place. Ask someone to hold it for you, if possible. Clearly mark and make sure that you can see: Any grab bars or handrails. First and last steps. Where the edge of each step is. Use tools that help you move around (mobility aids) if they are  needed. These include: Canes. Walkers. Scooters. Crutches. Turn on the lights when you go into a dark area. Replace any light bulbs as soon as they burn out. Set up your furniture so you have a clear path. Avoid moving your furniture around. If any of your floors are uneven, fix them. If there are any pets around you, be aware of where they are. Review your medicines with your doctor. Some medicines can make you feel dizzy. This can increase your chance of falling. Ask your doctor what other things that you can do to help prevent falls. This information is not intended to replace advice given to you by your health care provider. Make sure you discuss any questions you have with your health care provider. Document Released: 03/03/2009 Document Revised: 10/13/2015 Document Reviewed: 06/11/2014 Elsevier Interactive Patient Education  2017 [employer].

## 2021-11-13 ENCOUNTER — Ambulatory Visit: Payer: Self-pay

## 2021-11-13 ENCOUNTER — Other Ambulatory Visit: Payer: Self-pay | Admitting: Nurse Practitioner

## 2021-11-13 NOTE — Telephone Encounter (Signed)
Requested medications are due for refill today.  Too soon  Requested medications are on the active medications list.  yes  Last refill. 10/10/2020 #90 4 refills  Future visit scheduled.   yes  Notes to clinic.  Pt is calling back requesting an update on medication refill. Pt wants to know if this medication is free? Pt stated she does not have the money to pay for medication.     Please advise    Requested Prescriptions  Pending Prescriptions Disp Refills   clopidogrel (PLAVIX) 75 MG tablet [Pharmacy Med Name: CLOPIDOGREL BISULFATE 75 MG TAB] 90 tablet 4    Sig: TAKE 1 TABLET BY MOUTH ONCE DAILY AT 6:00AM     Hematology: Antiplatelets - clopidogrel Passed - 11/13/2021  8:26 AM      Passed - HCT in normal range and within 180 days    Hematocrit  Date Value Ref Range Status  08/21/2021 41.2 34.0 - 46.6 % Final         Passed - HGB in normal range and within 180 days    Hemoglobin  Date Value Ref Range Status  08/21/2021 13.3 11.1 - 15.9 g/dL Final         Passed - PLT in normal range and within 180 days    Platelets  Date Value Ref Range Status  08/21/2021 405 150 - 450 x10E3/uL Final         Passed - Cr in normal range and within 360 days    Creatinine, Ser  Date Value Ref Range Status  08/21/2021 0.79 0.57 - 1.00 mg/dL Final         Passed - Valid encounter within last 6 months    Recent Outpatient Visits           2 months ago Colicky RLQ abdominal pain   [facility] [place], Pingree T, NP   5 months ago Aortic atherosclerosis (HCC)   [facility] [place], [name] T, NP   9 months ago Insomnia, unspecified type   [facility] [place], Lauren A, NP   11 months ago History of cerebellar stroke   [facility], Boling T, NP   1 year ago Situational anxiety   [facility] [place], Dorie Rank, NP       Future Appointments             In 3 weeks Cannady, Dorie Rank, NP [employer],  [facility]

## 2021-11-15 ENCOUNTER — Ambulatory Visit (INDEPENDENT_AMBULATORY_CARE_PROVIDER_SITE_OTHER): Payer: Medicare HMO | Admitting: Nurse Practitioner

## 2021-11-15 ENCOUNTER — Encounter: Payer: Self-pay | Admitting: Nurse Practitioner

## 2021-11-15 DIAGNOSIS — Z91030 Bee allergy status: Secondary | ICD-10-CM

## 2021-11-15 MED ORDER — EPINEPHRINE 0.3 MG/0.3ML IJ SOAJ
0.3000 mg | INTRAMUSCULAR | 2 refills | Status: AC | PRN
Start: 2021-11-15 — End: ?

## 2021-11-15 NOTE — Assessment & Plan Note (Signed)
Will send in Epi Pen to use as needed due to allergy.

## 2021-11-15 NOTE — Progress Notes (Signed)
There were no vitals taken for this visit.   Subjective:    Patient ID: Tina Hardy, female    DOB: 01/24/1941, 81 y.o.   MRN: [id number]  HPI: Tina Hardy is a 81 y.o. female  Chief Complaint  Patient presents with   Medication Consultation    Patient is requesting an EPI-PEN and would like to discuss with provider at today's visit. Patient denies having any concerns at today's visit.    This visit was completed via telephone due to the restrictions of the COVID-19 pandemic. All issues as above were discussed and addressed but no physical exam was performed. If it was felt that the patient should be evaluated in the office, they were directed there. The patient verbally consented to this visit. Patient was unable to complete an audio/visual visit due to no access. Due to the catastrophic nature of the COVID-19 pandemic, this visit was done through audio contact only. Location of the patient: home Location of the provider: work Those involved with this call:  Provider: Marnee Guarneri, DNP CMA: Irena Reichmann, [place] Desk/Registration: [employer]  Time spent on call:  21 minutes on the phone discussing health concerns. 15 minutes total spent in review of patient's record and preparation of their chart.  I verified patient identity using two factors (patient name and date of birth). Patient consents verbally to being seen via telemedicine visit today.    BEE ALLERGY: She is allergic to bees and is in need of Epi Pen.  Does go sit outside a lot and sun bathe, worried about the bees being near her.  Has been stung before and that is when she realized she had an allergy.  Does not currently have Epi Pen. Has no other concerns today.  Relevant past medical, surgical, family and social history reviewed and updated as indicated. Interim medical history since our last visit reviewed. Allergies and medications reviewed and updated.  Review of Systems  Constitutional:  Negative  for activity change, appetite change, diaphoresis, fatigue and fever.  Respiratory:  Negative for cough, chest tightness and shortness of breath.   Cardiovascular:  Negative for chest pain, palpitations and leg swelling.  Gastrointestinal: Negative.   Endocrine: Negative.   Neurological: Negative.   Psychiatric/Behavioral: Negative.      Per HPI unless specifically indicated above     Objective:    There were no vitals taken for this visit.  Wt Readings from Last 3 Encounters:  08/21/21 154 lb 9.6 oz (70.1 kg)  06/08/21 156 lb (70.8 kg)  12/05/20 152 lb (68.9 kg)    Physical Exam  Unable to perform due to telephone visit only.  Results for orders placed or performed in visit on 08/21/21  CBC with Differential/Platelet  Result Value Ref Range   WBC 7.7 3.4 - 10.8 x10E3/uL   RBC 4.48 3.77 - 5.28 x10E6/uL   Hemoglobin 13.3 11.1 - 15.9 g/dL   Hematocrit 41.2 34.0 - 46.6 %   MCV 92 79 - 97 fL   MCH 29.7 26.6 - 33.0 pg   MCHC 32.3 31.5 - 35.7 g/dL   RDW 12.9 11.7 - 15.4 %   Platelets 405 150 - 450 x10E3/uL   Neutrophils 59 Not Estab. %   Lymphs 27 Not Estab. %   Monocytes 11 Not Estab. %   Eos 2 Not Estab. %   Basos 1 Not Estab. %   Neutrophils Absolute 4.7 1.4 - 7.0 x10E3/uL   Lymphocytes Absolute 2.1 0.7 - 3.1 x10E3/uL  Monocytes Absolute 0.8 0.1 - 0.9 x10E3/uL   EOS (ABSOLUTE) 0.1 0.0 - 0.4 x10E3/uL   Basophils Absolute 0.1 0.0 - 0.2 x10E3/uL   Immature Granulocytes 0 Not Estab. %   Immature Grans (Abs) 0.0 0.0 - 0.1 x10E3/uL  Comprehensive metabolic panel  Result Value Ref Range   Glucose 84 70 - 99 mg/dL   BUN 14 8 - 27 mg/dL   Creatinine, Ser 0.79 0.57 - 1.00 mg/dL   eGFR 76 >59 mL/min/1.73   BUN/Creatinine Ratio 18 12 - 28   Sodium 144 134 - 144 mmol/L   Potassium 4.7 3.5 - 5.2 mmol/L   Chloride 106 96 - 106 mmol/L   CO2 23 20 - 29 mmol/L   Calcium 9.9 8.7 - 10.3 mg/dL   Total Protein 6.6 6.0 - 8.5 g/dL   Albumin 4.0 3.7 - 4.7 g/dL   Globulin, Total 2.6  1.5 - 4.5 g/dL   Albumin/Globulin Ratio 1.5 1.2 - 2.2   Bilirubin Total 0.5 0.0 - 1.2 mg/dL   Alkaline Phosphatase 93 44 - 121 IU/L   AST 21 0 - 40 IU/L   ALT 18 0 - 32 IU/L      Assessment & Plan:   Problem List Items Addressed This Visit       Other   Bee sting allergy    Will send in Epi Pen to use as needed due to allergy.       I discussed the assessment and treatment plan with the patient. The patient was provided an opportunity to ask questions and all were answered. The patient agreed with the plan and demonstrated an understanding of the instructions.   The patient was advised to call back or seek an in-person evaluation if the symptoms worsen or if the condition fails to improve as anticipated.   I provided 21+ minutes of time during this encounter.   Follow up plan: Return if symptoms worsen or fail to improve.

## 2021-11-15 NOTE — Patient Instructions (Signed)
Allergic Rhinitis, Adult  Allergic rhinitis is an allergic reaction that affects the mucous membrane inside the nose. The mucous membrane is the tissue that produces mucus. There are two types of allergic rhinitis: Seasonal. This type is also called hay fever and happens only during certain seasons. Perennial. This type can happen at any time of the year. Allergic rhinitis cannot be spread from person to person. This condition can be mild, moderate, or severe. It can develop at any age and may be outgrown. What are the causes? This condition is caused by allergens. These are things that can cause an allergic reaction. Allergens may differ for seasonal allergic rhinitis and perennial allergic rhinitis. Seasonal allergic rhinitis is triggered by pollen. Pollen can come from grasses, trees, and weeds. Perennial allergic rhinitis may be triggered by: Dust mites. Proteins in a pet's urine, saliva, or dander. Dander is dead skin cells from a pet. Smoke, mold, or car fumes. What increases the risk? You are more likely to develop this condition if you have a family history of allergies or other conditions related to allergies, including: Allergic conjunctivitis. This is inflammation of parts of the eyes and eyelids. Asthma. This condition affects the lungs and makes it hard to breathe. Atopic dermatitis or eczema. This is long term (chronic) inflammation of the skin. Food allergies. What are the signs or symptoms? Symptoms of this condition include: Sneezing or coughing. A stuffy nose (nasal congestion), itchy nose, or nasal discharge. Itchy eyes and tearing of the eyes. A feeling of mucus dripping down the back of your throat (postnasal drip). Trouble sleeping. Tiredness or fatigue. Headache. Sore throat. How is this diagnosed? This condition may be diagnosed with your symptoms, medical history, and physical exam. Your health care provider may check for related conditions, such  as: Asthma. Pink eye. This is eye inflammation caused by infection (conjunctivitis). Ear infection. Upper respiratory infection. This is an infection in the nose, throat, or upper airways. You may also have tests to find out which allergens trigger your symptoms. These may include skin tests or blood tests. How is this treated? There is no cure for this condition, but treatment can help control symptoms. Treatment may include: Taking medicines that block allergy symptoms, such as corticosteroids and antihistamines. Medicine may be given as a shot, nasal spray, or pill. Avoiding any allergens. Being exposed again and again to tiny amounts of allergens to help you build a defense against allergens (immunotherapy). This is done if other treatments have not helped. It may include: Allergy shots. These are injected medicines that have small amounts of allergen in them. Sublingual immunotherapy. This involves taking small doses of a medicine with allergen in it under your tongue. If these treatments do not work, your health care provider may prescribe newer, stronger medicines. Follow these instructions at home: Avoiding allergens Find out what you are allergic to and avoid those allergens. These are some things you can do to help avoid allergens: If you have perennial allergies: Replace carpet with wood, tile, or vinyl flooring. Carpet can trap dander and dust. Do not smoke. Do not allow smoking in your home. Change your heating and air conditioning filters at least once a month. If you have seasonal allergies, take these steps during allergy season: Keep windows closed as much as possible. Plan outdoor activities when pollen counts are lowest. Check pollen counts before you plan outdoor activities. When coming indoors, change clothing and shower before sitting on furniture or bedding. If you have a pet   in the house that produces allergens: Keep the pet out of the bedroom. Vacuum, sweep, and  dust regularly. General instructions Take over-the-counter and prescription medicines only as told by your health care provider. Drink enough fluid to keep your urine pale yellow. Keep all follow-up visits as told by your health care provider. This is important. Where to find more information American Academy of Allergy, Asthma & Immunology: www.aaaai.org Contact a health care provider if: You have a fever. You develop a cough that does not go away. You make whistling sounds when you breathe (wheeze). Your symptoms slow you down or stop you from doing your normal activities each day. Get help right away if: You have shortness of breath. This symptom may represent a serious problem that is an emergency. Do not wait to see if the symptom will go away. Get medical help right away. Call your local emergency services (911 in the [place] Do not drive yourself to the hospital. Summary Allergic rhinitis may be managed by taking medicines as directed and avoiding allergens. If you have seasonal allergies, keep windows closed as much as possible during allergy season. Contact your health care provider if you develop a fever or a cough that does not go away. This information is not intended to replace advice given to you by your health care provider. Make sure you discuss any questions you have with your health care provider. Document Revised: 06/26/2019 Document Reviewed: 05/05/2019 Elsevier Patient Education  2023 Elsevier Inc.  

## 2021-11-24 ENCOUNTER — Telehealth: Payer: Self-pay

## 2021-11-24 NOTE — Telephone Encounter (Signed)
  Care Management   Follow Up Note   11/24/2021 Name: Tina Hardy MRN: [id number] DOB: 1940-09-08   Referred by: Venita Lick, NP Reason for referral : Chronic Care Management ([name]: Follow up for Chronic Disease Management and Care Coordination Needs/Case closure)   An unsuccessful telephone outreach was attempted today. The patient was referred to the case management team for assistance with care management and care coordination. Case closure call also. The patient has met the goals of care. No new appointments with the [name] needs.   Follow Up Plan: A HIPPA compliant phone message was left for the patient providing contact information and requesting a return call.   Noreene Larsson RN, MSN, [facility] Mobile: [phone]

## 2021-12-06 ENCOUNTER — Ambulatory Visit: Payer: Medicare Other | Admitting: Nurse Practitioner

## 2021-12-06 DIAGNOSIS — F1290 Cannabis use, unspecified, uncomplicated: Secondary | ICD-10-CM

## 2021-12-06 DIAGNOSIS — E559 Vitamin D deficiency, unspecified: Secondary | ICD-10-CM

## 2021-12-06 DIAGNOSIS — Z8673 Personal history of transient ischemic attack (TIA), and cerebral infarction without residual deficits: Secondary | ICD-10-CM

## 2021-12-06 DIAGNOSIS — E538 Deficiency of other specified B group vitamins: Secondary | ICD-10-CM

## 2021-12-06 DIAGNOSIS — I700 Atherosclerosis of aorta: Secondary | ICD-10-CM

## 2021-12-06 DIAGNOSIS — R7309 Other abnormal glucose: Secondary | ICD-10-CM

## 2021-12-06 DIAGNOSIS — I358 Other nonrheumatic aortic valve disorders: Secondary | ICD-10-CM

## 2021-12-06 DIAGNOSIS — F418 Other specified anxiety disorders: Secondary | ICD-10-CM

## 2021-12-06 DIAGNOSIS — F5104 Psychophysiologic insomnia: Secondary | ICD-10-CM

## 2021-12-06 DIAGNOSIS — I10 Essential (primary) hypertension: Secondary | ICD-10-CM

## 2021-12-06 DIAGNOSIS — E782 Mixed hyperlipidemia: Secondary | ICD-10-CM

## 2021-12-11 ENCOUNTER — Ambulatory Visit: Payer: Medicare Other | Admitting: Licensed Clinical Social Worker

## 2021-12-18 ENCOUNTER — Ambulatory Visit: Payer: Medicare Other | Admitting: Nurse Practitioner

## 2021-12-18 DIAGNOSIS — E538 Deficiency of other specified B group vitamins: Secondary | ICD-10-CM

## 2021-12-18 DIAGNOSIS — F418 Other specified anxiety disorders: Secondary | ICD-10-CM

## 2021-12-18 DIAGNOSIS — R7309 Other abnormal glucose: Secondary | ICD-10-CM

## 2021-12-18 DIAGNOSIS — I700 Atherosclerosis of aorta: Secondary | ICD-10-CM

## 2021-12-18 DIAGNOSIS — E782 Mixed hyperlipidemia: Secondary | ICD-10-CM

## 2021-12-18 DIAGNOSIS — I10 Essential (primary) hypertension: Secondary | ICD-10-CM

## 2021-12-18 DIAGNOSIS — E559 Vitamin D deficiency, unspecified: Secondary | ICD-10-CM

## 2021-12-18 DIAGNOSIS — Z8673 Personal history of transient ischemic attack (TIA), and cerebral infarction without residual deficits: Secondary | ICD-10-CM

## 2021-12-19 ENCOUNTER — Telehealth: Payer: Medicaid Other

## 2021-12-25 NOTE — Patient Instructions (Signed)
Visit Information  Thank you for taking time to visit with me today. Please don't hesitate to contact me if I can be of assistance to you before our next scheduled telephone appointment.   If you are experiencing a Mental Health or [place] or need someone to talk to, please call the Suicide and Crisis Lifeline: 988 call 911   The patient verbalized understanding of instructions, educational materials, and care plan provided today and DECLINED offer to receive copy of patient instructions, educational materials, and care plan.   No follow up required  Christa See, MSW, [email or web address] Phone [phone] 6:43 AM

## 2021-12-25 NOTE — Chronic Care Management (AMB) (Signed)
Care Management Clinical Social Work Note  12/25/2021 Name: Tina Hardy MRN: [id number] DOB: June 05, 1940  Tina Hardy is a 81 y.o. year old female who is a primary care patient of Cannady, Barbaraann Faster, NP.  The Care Management team was consulted for assistance with chronic disease management and coordination needs.  Engaged with patient by telephone for follow up visit in response to provider referral for social work chronic care management and care coordination services  Consent to Services:  Tina Hardy was given information about Care Management services today including:  Care Management services includes personalized support from designated clinical staff supervised by her physician, including individualized plan of care and coordination with other care providers 24/7 contact phone numbers for assistance for urgent and routine care needs. The patient may stop case management services at any time by phone call to the office staff.  Patient agreed to services and consent obtained.   Assessment: Review of patient past medical history, allergies, medications, and health status, including review of relevant consultants reports was performed today as part of a comprehensive evaluation and provision of chronic care management and care coordination services.  SDOH (Social Determinants of Health) assessments and interventions performed:    Advanced Directives Status: Not addressed in this encounter.  Care Plan  Allergies  Allergen Reactions   Bee Venom Anaphylaxis   Codeine Other (See Comments)    Unknown reaction    Penicillins Other (See Comments)    Unknown reaction  Has patient had a PCN reaction causing immediate rash, facial/tongue/throat swelling, SOB or lightheadedness with hypotension: n/a Has patient had a PCN reaction causing severe rash involving mucus membranes or skin necrosis: n/a Has patient had a PCN reaction that required hospitalization: n/a Has patient had a PCN  reaction occurring within the last 10 years: n/a If all of the above answers are "NO", then may proceed with Cephalosporin use.     Outpatient Encounter Medications as of 12/11/2021  Medication Sig   amLODipine (NORVASC) 5 MG tablet Take 1 tablet (5 mg total) by mouth daily.   aspirin EC 81 MG EC tablet Take 1 tablet (81 mg total) by mouth daily.   busPIRone (BUSPAR) 5 MG tablet Take 1 tablet (5 mg total) by mouth 2 (two) times daily.   Cholecalciferol (VITAMIN D3) 50 MCG (2000 UT) capsule Take 1 capsule (2,000 Units total) by mouth daily.   citalopram (CELEXA) 10 MG tablet Take 1 tablet (10 mg total) by mouth daily.   clopidogrel (PLAVIX) 75 MG tablet TAKE 1 TABLET BY MOUTH ONCE DAILY AT 6:00AM   EPINEPHrine 0.3 mg/0.3 mL IJ SOAJ injection Inject 0.3 mg into the muscle as needed for anaphylaxis.   rosuvastatin (CRESTOR) 10 MG tablet Take 1 tablet (10 mg total) by mouth daily.   vitamin B-12 (CYANOCOBALAMIN) 1000 MCG tablet Take 1 tablet (1,000 mcg total) by mouth daily.   No facility-administered encounter medications on file as of 12/11/2021.    Patient Active Problem List   Diagnosis Date Noted   Bee sting allergy 11/15/2021   Vitamin D deficiency 06/09/2021   Vitamin B12 deficiency 06/09/2021   DDD (degenerative disc disease), lumbar 06/18/2020   Aortic atherosclerosis ([place]) 06/18/2020   History of cerebellar stroke 06/18/2020   Elevated hemoglobin A1c 05/26/2019   Aortic valve sclerosis 04/24/2019   Situational anxiety 04/24/2019   Stenosis of left carotid artery 03/23/2019   Insomnia 03/23/2019   Marijuana use 03/23/2019   Hypertension 03/22/2019   Hyperlipidemia 03/22/2019   Hepatic hemangioma  07/16/2012   Diverticulosis of colon 06/22/2008    Conditions to be addressed/monitored: HTN and Anxiety  Care Plan : General Social Work (Adult)  Updates made by Christa See D, LCSW since 12/25/2021 12:00 AM     Problem: Anxiety Identification (Anxiety)      Long-Range  Goal: Anxiety Symptoms Identified Completed 12/11/2021  Start Date: 08/19/2020  This Visit's Progress: On track  Recent Progress: On track  Priority: Medium  Note:   Timeframe:  Long-Range Goal Priority:  Medium Start Date:  08/19/20                           Expected End Date:   03/20/21                  Follow Up Date- 02/03/21   Current Barriers:  Chronic Mental Health needs related to generalized anxiety disorder Mental Health Concerns  Social Isolation Transportation Barriers-unable to get to appointments as her car no longer works. Suicidal Ideation/Homicidal Ideation: No  Clinical Social Work Goal(s):  Over the next 120 days, patient will work with SW bi-monthly by telephone or in person to reduce or manage symptoms related to anxiety and stress  Over the next 120 days, patient will demonstrate improved health management independence as evidenced by implementing appropriate anxiety management coping skills and self-care into her daily routine to combat future symptoms  Interventions: Assessed patient's previous and current treatment, coping skills, support system and barriers to care  Patient interviewed and appropriate assessments performed: brief mental health assessment Patient reports that she is doing well. States that she is interested in reducing the number of medications she is on, which she has discussed with PCP Patient plans on bringing all of her medications to upcoming appointment with PCP scheduled for 12/05/20 to review CCM LCSW inquired about any barriers, in regards, to getting to appointment. Patient shared that she is need of transportation due to adult son being out of town with her car. CCM LCSW provided patient with the contact number for [facility] to initiate services for upcoming appointment. Patient agreed to contact CCM LCSW with any questions or concerns Patient reports that her anxiety symptoms have been managed well. She understands how past  grief and stress negatively impacted her mental health  Patient enjoys spending time outside and has a strong spiritual relationship that assists with promoting positive mood Mindfulness or Relaxation Training, Active listening / Reflection utilized , and Emotional Supportive Provided Discussed plans with patient for ongoing care management follow up and provided patient with direct contact information for care management team Emotional/Supportive Counseling provided during session. Patient was receptive to anxiety management education.  CCM LCSW collaborated with [employer] [phone] A new referral was placed for patient Collaboration with PCP regarding development and update of comprehensive plan of care as evidenced by provider attestation and co-signature Inter-disciplinary care team collaboration (see longitudinal plan of care)  Patient Self Care Activities:  Attend all scheduled appointments Call [facility] [phone]  Continue with compliance of taking medication        Follow Up Plan: No follow up required.   Christa See, MSW, [email or web address] Phone [phone] 6:43 AM

## 2021-12-27 ENCOUNTER — Ambulatory Visit: Payer: Medicare Other | Admitting: Nurse Practitioner

## 2021-12-27 DIAGNOSIS — I10 Essential (primary) hypertension: Secondary | ICD-10-CM

## 2021-12-27 DIAGNOSIS — I6522 Occlusion and stenosis of left carotid artery: Secondary | ICD-10-CM

## 2021-12-27 DIAGNOSIS — Z8673 Personal history of transient ischemic attack (TIA), and cerebral infarction without residual deficits: Secondary | ICD-10-CM

## 2021-12-27 DIAGNOSIS — E559 Vitamin D deficiency, unspecified: Secondary | ICD-10-CM

## 2021-12-27 DIAGNOSIS — E782 Mixed hyperlipidemia: Secondary | ICD-10-CM

## 2021-12-27 DIAGNOSIS — E538 Deficiency of other specified B group vitamins: Secondary | ICD-10-CM

## 2021-12-27 DIAGNOSIS — I700 Atherosclerosis of aorta: Secondary | ICD-10-CM

## 2021-12-27 DIAGNOSIS — R7309 Other abnormal glucose: Secondary | ICD-10-CM

## 2021-12-27 DIAGNOSIS — F418 Other specified anxiety disorders: Secondary | ICD-10-CM

## 2021-12-27 DIAGNOSIS — I358 Other nonrheumatic aortic valve disorders: Secondary | ICD-10-CM

## 2022-01-29 ENCOUNTER — Emergency Department: Payer: Medicare Other

## 2022-01-29 ENCOUNTER — Emergency Department
Admission: EM | Admit: 2022-01-29 | Discharge: 2022-01-29 | Disposition: A | Discharge status: Home / Self Care | Payer: Medicare Other | Attending: Emergency Medicine | Admitting: Emergency Medicine

## 2022-01-29 ENCOUNTER — Other Ambulatory Visit: Payer: Self-pay

## 2022-01-29 ENCOUNTER — Ambulatory Visit: Payer: Self-pay

## 2022-01-29 DIAGNOSIS — R112 Nausea with vomiting, unspecified: Secondary | ICD-10-CM | POA: Insufficient documentation

## 2022-01-29 DIAGNOSIS — J209 Acute bronchitis, unspecified: Secondary | ICD-10-CM | POA: Diagnosis not present

## 2022-01-29 DIAGNOSIS — I2510 Atherosclerotic heart disease of native coronary artery without angina pectoris: Secondary | ICD-10-CM | POA: Diagnosis not present

## 2022-01-29 DIAGNOSIS — R079 Chest pain, unspecified: Secondary | ICD-10-CM | POA: Diagnosis not present

## 2022-01-29 DIAGNOSIS — R0602 Shortness of breath: Secondary | ICD-10-CM | POA: Diagnosis not present

## 2022-01-29 DIAGNOSIS — I10 Essential (primary) hypertension: Secondary | ICD-10-CM | POA: Diagnosis not present

## 2022-01-29 DIAGNOSIS — I700 Atherosclerosis of aorta: Secondary | ICD-10-CM | POA: Diagnosis not present

## 2022-01-29 LAB — CBC
HCT: 41.5 % (ref 36.0–46.0)
Hemoglobin: 13.2 g/dL (ref 12.0–15.0)
MCH: 29.7 pg (ref 26.0–34.0)
MCHC: 31.8 g/dL (ref 30.0–36.0)
MCV: 93.5 fL (ref 80.0–100.0)
Platelets: 335 10*3/uL (ref 150–400)
RBC: 4.44 MIL/uL (ref 3.87–5.11)
RDW: 13 % (ref 11.5–15.5)
WBC: 7.6 10*3/uL (ref 4.0–10.5)
nRBC: 0 % (ref 0.0–0.2)

## 2022-01-29 LAB — BASIC METABOLIC PANEL
Anion gap: 7 (ref 5–15)
BUN: 10 mg/dL (ref 8–23)
CO2: 27 mmol/L (ref 22–32)
Calcium: 8.9 mg/dL (ref 8.9–10.3)
Chloride: 105 mmol/L (ref 98–111)
Creatinine, Ser: 0.78 mg/dL (ref 0.44–1.00)
GFR, Estimated: >60 mL/min (ref >60)
Glucose, Bld: 117 mg/dL — ABNORMAL HIGH (ref 70–99)
Potassium: 3.8 mmol/L (ref 3.5–5.1)
Sodium: 139 mmol/L (ref 135–145)

## 2022-01-29 LAB — TROPONIN I (HIGH SENSITIVITY): Troponin I (High Sensitivity): 5 ng/L (ref <18)

## 2022-01-29 MED ORDER — BENZONATATE 100 MG PO CAPS
200.0000 mg | ORAL_CAPSULE | Freq: Once | ORAL | Status: AC
  Administered 2022-01-29: 200 mg via ORAL
  Filled 2022-01-29: qty 2

## 2022-01-29 MED ORDER — PREDNISONE 20 MG PO TABS: 20.0000 mg | ORAL_TABLET | Freq: Every day | ORAL | 0 refills | Status: AC

## 2022-01-29 MED ORDER — BENZONATATE 100 MG PO CAPS: 100.0000 mg | ORAL_CAPSULE | Freq: Three times a day (TID) | ORAL | 0 refills | Status: DC | PRN

## 2022-01-29 MED ORDER — PREDNISONE 20 MG PO TABS
20.0000 mg | ORAL_TABLET | Freq: Once | ORAL | Status: AC
  Administered 2022-01-29: 20 mg via ORAL
  Filled 2022-01-29: qty 1

## 2022-01-29 NOTE — ED Triage Notes (Signed)
Pt arrives with c/o SOB that started about 2 days ago. Pt endorses CP and n/v.

## 2022-01-29 NOTE — Telephone Encounter (Signed)
  Chief Complaint: chest pain Symptoms: chest pain, coughing, wheezing, sore throat  Frequency: 2 days Pertinent Negatives: NA Disposition: '[x]'$ ED /'[]'$ Urgent Care (no appt availability in office) / '[]'$ Appointment(In office/virtual)/ '[]'$  [facility]/ '[]'$ Home Care/ '[]'$ Refused Recommended Disposition /'[]'$ [facility]/ '[]'$  Follow-up with PCP Additional Notes: pt states chest pain is 8/10 in the middle of chest, hurts worse when coughing advised pt based on sx to go to ED. Pt refused and didn't want to go there. Advised pt of no appts today. States she doesn't have transportation. Advised pt that if I sent back message that I felt [name] would recommend calling EMS to go to ED. Pt states she would see if she can find someone to take her.   Reason for Disposition  Taking a deep breath makes pain worse  Answer Assessment - Initial Assessment Questions 1. LOCATION: "Where does it hurt?"       In middle  2. RADIATION: "Does the pain go anywhere else?" (e.g., into neck, jaw, arms, back)     no 3. ONSET: "When did the chest pain begin?" (Minutes, hours or days)      2 days ago  4. PATTERN: "Does the pain come and go, or has it been constant since it started?"  "Does it get worse with exertion?"      Constant  6. SEVERITY: "How bad is the pain?"  (e.g., Scale 1-10; mild, moderate, or severe)    - MILD (1-3): doesn't interfere with normal activities     - MODERATE (4-7): interferes with normal activities or awakens from sleep    - SEVERE (8-10): excruciating pain, unable to do any normal activities       8 10. OTHER SYMPTOMS: "Do you have any other symptoms?" (e.g., dizziness, nausea, vomiting, sweating, fever, difficulty breathing, cough)       Coughing, sore throat, dizziness slightly, wheezing  Protocols used: Chest Pain-A-AH

## 2022-01-29 NOTE — Discharge Instructions (Addendum)
Your lab tests and chest x-ray today are all okay.  Take prednisone and Tessalon to help with your symptoms, and follow-up with your doctor later this week.

## 2022-01-29 NOTE — ED Provider Notes (Signed)
[facility] Provider Note    Event Date/Time   First MD Initiated Contact with Patient 01/29/22 1249     (approximate)   History   Chief Complaint: Shortness of Breath   HPI  Tina Hardy is a 81 y.o. female with a history of diverticulosis, CAD, hypertension who comes the ED complaining of shortness of breath for the last 2 days, gradual onset, no aggravating or alleviating factors, associate with nonproductive cough.  Also has some chest tightness and nausea and vomiting with decreased oral intake.  Overall she states that she feels fine.  Not pleuritic.  No abdominal pain, no dizziness.     Physical Exam   Triage Vital Signs: ED Triage Vitals  Enc Vitals Group     BP 01/29/22 1239 132/61     Pulse Rate 01/29/22 1239 71     Resp 01/29/22 1239 19     Temp 01/29/22 1239 98.4 F (36.9 C)     Temp Source 01/29/22 1239 Oral     SpO2 01/29/22 1239 94 %     Weight 01/29/22 1236 154 lb (69.9 kg)     Height --      Head Circumference --      Peak Flow --      Pain Score 01/29/22 1236 7     Pain Loc --      Pain Edu? --      Excl. in [place]? --     Most recent vital signs: Vitals:   01/29/22 1400 01/29/22 1430  BP:  (!) 140/70  Pulse: 76 74  Resp: 17 19  Temp:  97.7 F (36.5 C)  SpO2: 94% 97%    General: Awake, no distress.  CV:  Good peripheral perfusion.  Regular rate and rhythm Resp:  Normal effort.  Good air entry in all lung fields.  Mild expiratory wheezing. Abd:  No distention.  Soft and nontender Other:  No lower extremity edema.  Moist oral mucosa.  Energetic   ED Results / Procedures / Treatments   Labs (all labs ordered are listed, but only abnormal results are displayed) Labs Reviewed  BASIC METABOLIC PANEL - Abnormal; Notable for the following components:      Result Value   Glucose, Bld 117 (*)    All other components within normal limits  CBC  TROPONIN I (HIGH SENSITIVITY)     EKG Interpreted by me Normal  sinus rhythm rate of 73.  Normal axis and intervals.  Poor R wave progression.  Normal ST segments and T waves.  No ischemic changes   RADIOLOGY Chest x-ray interpreted by me, appears normal.  Radiology report reviewed.   PROCEDURES:  Procedures   MEDICATIONS ORDERED IN ED: Medications  predniSONE (DELTASONE) tablet 20 mg (20 mg Oral Given 01/29/22 1354)  benzonatate (TESSALON) capsule 200 mg (200 mg Oral Given 01/29/22 1359)     IMPRESSION / MDM / ASSESSMENT AND PLAN / ED COURSE  I reviewed the triage vital signs and the nursing notes.                              Differential diagnosis includes, but is not limited to, pneumonia, pulmonary edema, pleural effusion, pneumothorax, viral illness/acute bronchitis, COPD  [name] presentation is most consistent with acute presentation with potential threat to life or bodily function.  Patient presents with shortness of breath and cough along with other symptoms suggestive of a viral illness.  Doubt ACS PE dissection or pericarditis.  She is energetic and nontoxic, vital signs are unremarkable.  Symptoms are noncardiac.  Serum labs are unremarkable as is her troponin and EKG.  Patient given prednisone and Tessalon.  She declines COVID testing.  Recommend follow-up with primary care.       FINAL CLINICAL IMPRESSION(S) / ED DIAGNOSES   Final diagnoses:  Acute bronchitis, unspecified organism     Rx / DC Orders   ED Discharge Orders          Ordered    predniSONE (DELTASONE) 20 MG tablet  Daily with breakfast        01/29/22 1456    benzonatate (TESSALON PERLES) 100 MG capsule  3 times daily PRN        01/29/22 1456             Note:  This document was prepared using Dragon voice recognition software and may include unintentional dictation errors.   Carrie Mew, MD 01/29/22 [phone]

## 2022-01-29 NOTE — ED Notes (Signed)
Pt transported to Xray. 

## 2022-01-30 ENCOUNTER — Telehealth: Payer: Self-pay | Admitting: *Deleted

## 2022-01-30 DIAGNOSIS — Z5982 Transportation insecurity: Secondary | ICD-10-CM

## 2022-01-30 NOTE — Telephone Encounter (Signed)
Patient was seen in the hospital on 01/29/22 and has a follow up appointment 02/01/22.

## 2022-01-30 NOTE — Telephone Encounter (Signed)
Left message for patient yesterday afternoon making patient aware of [name]'s recommendations. Patient was advised to give our office a call back if she had any questions or concerns.

## 2022-01-30 NOTE — Telephone Encounter (Signed)
Transition Care Management Follow-up Telephone Call Date of discharge and from where: [facility] 01-29-2022 How have you been since you were released from the hospital? Still coughing but feeling a little better Any questions or concerns? No  Items Reviewed: Did the pt receive and understand the discharge instructions provided? Yes  Medications obtained and verified? Yes  Other? No  Any new allergies since your discharge? No  Dietary orders reviewed? No Do you have support at home? Yes   Home Care and Equipment/Supplies: Were home health services ordered?  If so, what is the name of the agency?   Has the agency set up a time to come to the patient's home?  Were any new equipment or medical supplies ordered?   What is the name of the medical supply agency?  Were you able to get the supplies/equipment?  Do you have any questions related to the use of the equipment or supplies?    Functional Questionnaire: (I = Independent and D = Dependent) ADLs: I  Bathing/Dressing- I  Meal Prep- I  Eating- I  Maintaining continence- I  Transferring/Ambulation- I  Managing Meds- I  Follow up appointments reviewed:  PCP Hospital f/u appt confirmed? Yes  Scheduled to see 02-01-2022 on [facility] f/u appt confirmed? No  . Are transportation arrangements needed? No  If their condition worsens, is the pt aware to call PCP or go to the Emergency Dept.? Yes Was the patient provided with contact information for the PCP's office or ED? Yes Was to pt encouraged to call back with questions or concerns? Yes

## 2022-02-01 ENCOUNTER — Encounter: Payer: Medicare Other | Admitting: Nurse Practitioner

## 2022-02-01 NOTE — Addendum Note (Signed)
Addended by: Marnee Guarneri T on: 02/01/2022 09:47 AM   Modules accepted: Orders

## 2022-02-04 NOTE — Patient Instructions (Signed)
Shortness of Breath, Adult Shortness of breath means you have trouble breathing. Shortness of breath could be a sign of a medical problem. Follow these instructions at home:  Pollution Do not smoke or use any products that contain nicotine or tobacco. If you need help quitting, ask your doctor. Avoid things that can make it harder to breathe, such as: Smoke of all kinds. This includes smoke from campfires or forest fires. Do not smoke or allow others to smoke in your home. Mold. Dust. Air pollution. Chemical smells. Things that can give you an allergic reaction (allergens) if you have allergies. Keep your living space clean. Use products that help remove mold and dust. General instructions Watch for any changes in your symptoms. Take over-the-counter and prescription medicines only as told by your doctor. This includes oxygen therapy and inhaled medicines. Rest as needed. Return to your normal activities when your doctor says that it is safe. Keep all follow-up visits. Contact a doctor if: Your condition does not get better as soon as expected. You have a hard time doing your normal activities, even after you rest. You have new symptoms. You cannot walk up stairs. You cannot exercise the way you normally do. Get help right away if: Your shortness of breath gets worse. You have trouble breathing when you are resting. You feel light-headed or you faint. You have a cough that is not helped by medicines. You cough up blood. You have pain with breathing. You have pain in your chest, arms, shoulders, or belly (abdomen). You have a fever. These symptoms may be an emergency. Get help right away. Call 911. Do not wait to see if the symptoms will go away. Do not drive yourself to the hospital. Summary Shortness of breath is when you have trouble breathing enough air. It can be a sign of a medical problem. Avoid things that make it hard for you to breathe, such as smoking, pollution,  mold, and dust. Watch for any changes in your symptoms. Contact your doctor if you do not get better or you get worse. This information is not intended to replace advice given to you by your health care provider. Make sure you discuss any questions you have with your health care provider. Document Revised: 12/24/2020 Document Reviewed: 12/24/2020 Elsevier Patient Education  2023 Elsevier Inc.  

## 2022-02-08 ENCOUNTER — Ambulatory Visit (INDEPENDENT_AMBULATORY_CARE_PROVIDER_SITE_OTHER): Payer: Medicare Other | Admitting: Nurse Practitioner

## 2022-02-08 ENCOUNTER — Encounter: Payer: Self-pay | Admitting: Nurse Practitioner

## 2022-02-08 DIAGNOSIS — J40 Bronchitis, not specified as acute or chronic: Secondary | ICD-10-CM | POA: Diagnosis not present

## 2022-02-08 NOTE — Assessment & Plan Note (Signed)
Acute and improved at this time.  Patient reports no concerns and no further symptoms.  Recommend to return to office if any symptoms return.  Overall exam reassuring today.

## 2022-02-08 NOTE — Progress Notes (Signed)
BP 132/70   Pulse 77 Comment: apical  Temp 97.8 F (36.6 C) (Oral)   Wt 153 lb 6.4 oz (69.6 kg)   SpO2 97%   BMI 26.75 kg/m    Subjective:    Patient ID: Tina Hardy, female    DOB: 04-Mar-1941, 81 y.o.   MRN: [id number]  HPI: Tina Hardy is a 81 y.o. female  Chief Complaint  Patient presents with   Shortness of Breath   BRONCHITIS Here today for follow-up on bronchitis, was seen in ER on 01/29/22 for this and treated with Prednisone and cough medication.  CXR was performed in ER and no acute process noted -- aortic atherosclerosis.  Labs reassuring.  She reports her symptoms are improving and not feeling like she was.   Fever: no Cough: yes a little bit Shortness of breath: improved Wheezing: a little bit, but improved now Chest pain: no Chest tightness: no Chest congestion: no Nasal congestion: no Runny nose: no Post nasal drip: no Sneezing: no Sore throat: no Swollen glands: no Sinus pressure: no Headache: no Face pain: no Toothache: no Ear pain: none Ear pressure: none Eyes red/itching:no Eye drainage/crusting: no  Vomiting: no Rash: no Fatigue: no Sick contacts: no Strep contacts: no  Context: better Recurrent sinusitis: no Relief with OTC cold/cough medications: yes  Treatments attempted: Prednisone and Tessalon    Relevant past medical, surgical, family and social history reviewed and updated as indicated. Interim medical history since our last visit reviewed. Allergies and medications reviewed and updated.  Review of Systems  Constitutional:  Negative for activity change, appetite change, diaphoresis, fatigue and fever.  Respiratory:  Negative for cough, chest tightness and shortness of breath.   Cardiovascular:  Negative for chest pain, palpitations and leg swelling.  Gastrointestinal: Negative.   Endocrine: Negative.   Neurological: Negative.   Psychiatric/Behavioral: Negative.      Per HPI unless specifically indicated above      Objective:    BP 132/70   Pulse 77 Comment: apical  Temp 97.8 F (36.6 C) (Oral)   Wt 153 lb 6.4 oz (69.6 kg)   SpO2 97%   BMI 26.75 kg/m   Wt Readings from Last 3 Encounters:  02/08/22 153 lb 6.4 oz (69.6 kg)  01/29/22 154 lb (69.9 kg)  08/21/21 154 lb 9.6 oz (70.1 kg)    Physical Exam Vitals and nursing note reviewed.  Constitutional:      General: She is awake. She is not in acute distress.    Appearance: She is well-developed and well-groomed. She is not ill-appearing.  HENT:     Head: Normocephalic and atraumatic. Hair is normal.     Right Ear: Hearing, tympanic membrane, ear canal and external ear normal.     Left Ear: Hearing, tympanic membrane, ear canal and external ear normal.     Nose: Nose normal. No rhinorrhea.     Right Sinus: No maxillary sinus tenderness or frontal sinus tenderness.     Left Sinus: No maxillary sinus tenderness or frontal sinus tenderness.     Mouth/Throat:     Mouth: Mucous membranes are moist.     Pharynx: Oropharynx is clear. No pharyngeal swelling, oropharyngeal exudate or posterior oropharyngeal erythema.  Eyes:     General: Lids are normal.     Extraocular Movements: Extraocular movements intact.     Pupils: Pupils are equal, round, and reactive to light.  Neck:     Thyroid: No thyromegaly.     Vascular: No carotid bruit.  Cardiovascular:     Rate and Rhythm: Normal rate and regular rhythm.     Heart sounds: Normal heart sounds. No murmur heard.    No gallop.  Pulmonary:     Effort: Pulmonary effort is normal. No accessory muscle usage or respiratory distress.     Breath sounds: Normal breath sounds.  Abdominal:     General: Bowel sounds are normal.     Palpations: Abdomen is soft.  Musculoskeletal:     Cervical back: Normal range of motion and neck supple.     Right lower leg: No edema.     Left lower leg: No edema.  Lymphadenopathy:     Cervical: No cervical adenopathy.  Skin:    General: Skin is warm and dry.   Neurological:     Mental Status: She is alert and oriented to person, place, and time.  Psychiatric:        Attention and Perception: Attention normal.        Mood and Affect: Mood normal.        Speech: Speech normal.        Behavior: Behavior normal. Behavior is cooperative.        Thought Content: Thought content normal.     Results for orders placed or performed during the hospital encounter of 05/39/76  Basic metabolic panel  Result Value Ref Range   Sodium 139 135 - 145 mmol/L   Potassium 3.8 3.5 - 5.1 mmol/L   Chloride 105 98 - 111 mmol/L   CO2 27 22 - 32 mmol/L   Glucose, Bld 117 (H) 70 - 99 mg/dL   BUN 10 8 - 23 mg/dL   Creatinine, Ser 0.78 0.44 - 1.00 mg/dL   Calcium 8.9 8.9 - 10.3 mg/dL   GFR, Estimated >60 >60 mL/min   Anion gap 7 5 - 15  CBC  Result Value Ref Range   WBC 7.6 4.0 - 10.5 K/uL   RBC 4.44 3.87 - 5.11 MIL/uL   Hemoglobin 13.2 12.0 - 15.0 g/dL   HCT 41.5 36.0 - 46.0 %   MCV 93.5 80.0 - 100.0 fL   MCH 29.7 26.0 - 34.0 pg   MCHC 31.8 30.0 - 36.0 g/dL   RDW 13.0 11.5 - 15.5 %   Platelets 335 150 - 400 K/uL   nRBC 0.0 0.0 - 0.2 %  Troponin I (High Sensitivity)  Result Value Ref Range   Troponin I (High Sensitivity) 5 <18 ng/L      Assessment & Plan:   Problem List Items Addressed This Visit       Respiratory   Bronchitis    Acute and improved at this time.  Patient reports no concerns and no further symptoms.  Recommend to return to office if any symptoms return.  Overall exam reassuring today.         Follow up plan: Return in about 3 months (around 05/10/2022) for HTN/HLD, MOOD, Vit B12, Vit D.

## 2022-02-09 ENCOUNTER — Telehealth: Payer: Self-pay

## 2022-02-09 NOTE — Telephone Encounter (Signed)
Called and LVM for patient's son to return my call if he still needed to speak with someone.

## 2022-02-09 NOTE — Telephone Encounter (Unsigned)
Copied from [place] [id number]. Topic: General - Other >> Feb 08, 2022 11:58 AM Everette C wrote: Reason for CRM: The patient's son Tina Hardy would like to be contacted by a member of clinical staff when possible to review the patient's recent visit from 02/08/22 at 10  Please contact further when possible to review the appointment when possible

## 2022-03-25 NOTE — Patient Instructions (Signed)

## 2022-03-27 ENCOUNTER — Ambulatory Visit (INDEPENDENT_AMBULATORY_CARE_PROVIDER_SITE_OTHER): Payer: Medicare Other | Admitting: Nurse Practitioner

## 2022-03-27 ENCOUNTER — Encounter: Payer: Self-pay | Admitting: Nurse Practitioner

## 2022-03-27 ENCOUNTER — Telehealth: Payer: Medicare Other | Admitting: Nurse Practitioner

## 2022-03-27 DIAGNOSIS — F5104 Psychophysiologic insomnia: Secondary | ICD-10-CM

## 2022-03-27 MED ORDER — TRAZODONE HCL 50 MG PO TABS: 25.0000 mg | ORAL_TABLET | Freq: Every evening | ORAL | 5 refills | Status: DC | PRN

## 2022-03-27 NOTE — Progress Notes (Signed)
There were no vitals taken for this visit.   Subjective:    Patient ID: Tina Hardy, female    DOB: 21-Mar-1941, 81 y.o.   MRN: [id number]  HPI: Tina Hardy is a 81 y.o. female  Chief Complaint  Patient presents with   Medication Management    Patient is here for Medication Management.    Insomnia    Patient says she is having issues with falling asleep.    This visit was completed via telephone due to the restrictions of the COVID-19 pandemic. All issues as above were discussed and addressed but no physical exam was performed. If it was felt that the patient should be evaluated in the office, they were directed there. The patient verbally consented to this visit. Patient was unable to complete an audio/visual visit due to Technical difficulties", "Lack of internet. Due to the catastrophic nature of the COVID-19 pandemic, this visit was done through audio contact only. Location of the patient: home Location of the provider: work Those involved with this call:  Provider: Marnee Guarneri, DNP CMA: Irena Reichmann, [place] Desk/Registration: [employer]  Time spent on call:  21 minutes on the phone discussing health concerns. 15 minutes total spent in review of patient's record and preparation of their chart.  I verified patient identity using two factors (patient name and date of birth). Patient consents verbally to being seen via telemedicine visit today.    INSOMNIA Reports she stays up most of the night watching television -- falls asleep around 4 or 5 in the morning, sleeps for a few hours and then wakes back up.  Has been chronic issue for years, but does not want to take addictive medication. Duration: chronic Satisfied with sleep quality: yes Difficulty falling asleep: yes Difficulty staying asleep: yes Waking a few hours after sleep onset: yes Early morning awakenings: yes Daytime hypersomnolence: no Wakes feeling refreshed: yes Good sleep hygiene: no Apnea:  no Snoring: no Depressed/anxious mood: yes Recent stress: no Restless legs/nocturnal leg cramps: no Chronic pain/arthritis: no History of sleep study: no Treatments attempted: melatonin    Relevant past medical, surgical, family and social history reviewed and updated as indicated. Interim medical history since our last visit reviewed. Allergies and medications reviewed and updated.  Review of Systems  Constitutional:  Negative for activity change, appetite change, diaphoresis, fatigue and fever.  Respiratory:  Negative for cough, chest tightness and shortness of breath.   Cardiovascular:  Negative for chest pain, palpitations and leg swelling.  Gastrointestinal: Negative.   Endocrine: Negative.   Neurological: Negative.   Psychiatric/Behavioral:  Positive for sleep disturbance. Negative for decreased concentration, self-injury and suicidal ideas. The patient is not nervous/anxious.     Per HPI unless specifically indicated above     Objective:    There were no vitals taken for this visit.  Wt Readings from Last 3 Encounters:  02/08/22 153 lb 6.4 oz (69.6 kg)  01/29/22 154 lb (69.9 kg)  08/21/21 154 lb 9.6 oz (70.1 kg)    Physical Exam  Unable to perform due to telephone visit only.  Results for orders placed or performed during the hospital encounter of 48/54/62  Basic metabolic panel  Result Value Ref Range   Sodium 139 135 - 145 mmol/L   Potassium 3.8 3.5 - 5.1 mmol/L   Chloride 105 98 - 111 mmol/L   CO2 27 22 - 32 mmol/L   Glucose, Bld 117 (H) 70 - 99 mg/dL   BUN 10 8 - 23  mg/dL   Creatinine, Ser 0.78 0.44 - 1.00 mg/dL   Calcium 8.9 8.9 - 10.3 mg/dL   GFR, Estimated >60 >60 mL/min   Anion gap 7 5 - 15  CBC  Result Value Ref Range   WBC 7.6 4.0 - 10.5 K/uL   RBC 4.44 3.87 - 5.11 MIL/uL   Hemoglobin 13.2 12.0 - 15.0 g/dL   HCT 41.5 36.0 - 46.0 %   MCV 93.5 80.0 - 100.0 fL   MCH 29.7 26.0 - 34.0 pg   MCHC 31.8 30.0 - 36.0 g/dL   RDW 13.0 11.5 - 15.5 %    Platelets 335 150 - 400 K/uL   nRBC 0.0 0.0 - 0.2 %  Troponin I (High Sensitivity)  Result Value Ref Range   Troponin I (High Sensitivity) 5 <18 ng/L      Assessment & Plan:   Problem List Items Addressed This Visit       Other   Insomnia - Primary (Chronic)    Chronic, ongoing.  Will restart Trazodone, has taken this in past without issue and she prefers something non addictive.  This is safer for her age group.  She has tried OTC Melatonin without benefit.  Discussed need to focus on sleep hygiene techniques and improvement, cutting off television near bedtime.  Meditation.  Return in 6 weeks.       I discussed the assessment and treatment plan with the patient. The patient was provided an opportunity to ask questions and all were answered. The patient agreed with the plan and demonstrated an understanding of the instructions.   The patient was advised to call back or seek an in-person evaluation if the symptoms worsen or if the condition fails to improve as anticipated.   I provided 21+ minutes of time during this encounter.    Follow up plan: Return for as scheduled 05/09/22.

## 2022-03-27 NOTE — Assessment & Plan Note (Signed)
Chronic, ongoing.  Will restart Trazodone, has taken this in past without issue and she prefers something non addictive.  This is safer for her age group.  She has tried OTC Melatonin without benefit.  Discussed need to focus on sleep hygiene techniques and improvement, cutting off television near bedtime.  Meditation.  Return in 6 weeks.

## 2022-03-28 ENCOUNTER — Telehealth: Payer: Medicare Other | Admitting: Nurse Practitioner

## 2022-04-03 ENCOUNTER — Telehealth: Payer: Medicare Other | Admitting: Nurse Practitioner

## 2022-05-06 NOTE — Patient Instructions (Incomplete)

## 2022-05-09 ENCOUNTER — Ambulatory Visit: Payer: Medicare Other | Admitting: Nurse Practitioner

## 2022-05-09 DIAGNOSIS — F5104 Psychophysiologic insomnia: Secondary | ICD-10-CM

## 2022-05-09 DIAGNOSIS — I700 Atherosclerosis of aorta: Secondary | ICD-10-CM

## 2022-05-09 DIAGNOSIS — E538 Deficiency of other specified B group vitamins: Secondary | ICD-10-CM

## 2022-05-09 DIAGNOSIS — Z8673 Personal history of transient ischemic attack (TIA), and cerebral infarction without residual deficits: Secondary | ICD-10-CM

## 2022-05-09 DIAGNOSIS — I10 Essential (primary) hypertension: Secondary | ICD-10-CM

## 2022-05-09 DIAGNOSIS — F418 Other specified anxiety disorders: Secondary | ICD-10-CM

## 2022-05-09 DIAGNOSIS — E782 Mixed hyperlipidemia: Secondary | ICD-10-CM

## 2022-05-14 NOTE — Patient Instructions (Signed)

## 2022-05-18 ENCOUNTER — Encounter: Payer: Self-pay | Admitting: Nurse Practitioner

## 2022-05-18 ENCOUNTER — Ambulatory Visit (INDEPENDENT_AMBULATORY_CARE_PROVIDER_SITE_OTHER): Payer: Medicare Other | Admitting: Nurse Practitioner

## 2022-05-18 VITALS — BP 128/78 | HR 81 | Temp 97.5°F | Resp 18 | Ht 63.5 in | Wt 150.6 lb

## 2022-05-18 DIAGNOSIS — F5104 Psychophysiologic insomnia: Secondary | ICD-10-CM

## 2022-05-18 DIAGNOSIS — E538 Deficiency of other specified B group vitamins: Secondary | ICD-10-CM

## 2022-05-18 DIAGNOSIS — R7309 Other abnormal glucose: Secondary | ICD-10-CM | POA: Diagnosis not present

## 2022-05-18 DIAGNOSIS — E782 Mixed hyperlipidemia: Secondary | ICD-10-CM

## 2022-05-18 DIAGNOSIS — Z8673 Personal history of transient ischemic attack (TIA), and cerebral infarction without residual deficits: Secondary | ICD-10-CM

## 2022-05-18 DIAGNOSIS — I6522 Occlusion and stenosis of left carotid artery: Secondary | ICD-10-CM | POA: Diagnosis not present

## 2022-05-18 DIAGNOSIS — I10 Essential (primary) hypertension: Secondary | ICD-10-CM | POA: Diagnosis not present

## 2022-05-18 DIAGNOSIS — E559 Vitamin D deficiency, unspecified: Secondary | ICD-10-CM | POA: Diagnosis not present

## 2022-05-18 DIAGNOSIS — I358 Other nonrheumatic aortic valve disorders: Secondary | ICD-10-CM | POA: Diagnosis not present

## 2022-05-18 DIAGNOSIS — I700 Atherosclerosis of aorta: Secondary | ICD-10-CM | POA: Diagnosis not present

## 2022-05-18 DIAGNOSIS — F418 Other specified anxiety disorders: Secondary | ICD-10-CM

## 2022-05-18 MED ORDER — CITALOPRAM HYDROBROMIDE 10 MG PO TABS: 10.0000 mg | ORAL_TABLET | Freq: Every day | ORAL | 4 refills | Status: DC

## 2022-05-18 MED ORDER — ROSUVASTATIN CALCIUM 10 MG PO TABS: 10.0000 mg | ORAL_TABLET | Freq: Every day | ORAL | 4 refills | Status: DC

## 2022-05-18 MED ORDER — AMLODIPINE BESYLATE 5 MG PO TABS: 5.0000 mg | ORAL_TABLET | Freq: Every day | ORAL | 4 refills | Status: DC

## 2022-05-18 MED ORDER — BUSPIRONE HCL 5 MG PO TABS: 5.0000 mg | ORAL_TABLET | Freq: Two times a day (BID) | ORAL | 4 refills | Status: DC

## 2022-05-18 NOTE — Assessment & Plan Note (Signed)
Ongoing and chronic. Denies SI/HI.  Continue Celexa and Buspar which offer her benefit.  Would avoid benzo use due to age and risk for chronic use.  Does not wish to attend psychiatry.  Return in 6 months.

## 2022-05-18 NOTE — Assessment & Plan Note (Signed)
Chronic, stable with BP at goal in office today.  Continue current medication regimen and adjust as needed.  Labs today: BMP.  Recommend checking BP at home regularly + focus on DASH diet.  Refills sent up to date.  Return in 6 months.

## 2022-05-18 NOTE — Assessment & Plan Note (Signed)
Ongoing, continue collaboration with vascular + current antiplatelet regimen. Recommend she follow-up with vascular.

## 2022-05-18 NOTE — Assessment & Plan Note (Signed)
Chronic.  Noted on past CT abdomen 08/29/2017.  Recommend she continue ASA and statin daily for prevention.

## 2022-05-18 NOTE — Assessment & Plan Note (Signed)
Chronic, ongoing.  Continue current medication regimen and adjust as needed.  Lipid panel today.  Return in 6 months. 

## 2022-05-18 NOTE — Assessment & Plan Note (Signed)
Recheck A1C today, no symptoms reported.

## 2022-05-18 NOTE — Assessment & Plan Note (Signed)
Chronic, ongoing.  Will continue Trazodone, has taken this in past without issue and she prefers something non addictive.  This is safer for her age group.  She has tried OTC Melatonin without benefit.  Discussed need to focus on sleep hygiene techniques and improvement, cutting off television near bedtime.  Meditation.  Return in 6 months.

## 2022-05-18 NOTE — Progress Notes (Signed)
BP 128/78 (BP Location: Left Arm, Patient Position: Sitting, Cuff Size: Normal)   Pulse 81   Temp (!) 97.5 F (36.4 C) (Oral)   Resp 18   Ht 5' 3.5" (1.613 m)   Wt 150 lb 9.6 oz (68.3 kg)   SpO2 94%   BMI 26.26 kg/m    Subjective:    Patient ID: Tina Hardy, female    DOB: 19-Jul-1940, 81 y.o.   MRN: [id number]  HPI: Tina Hardy is a 81 y.o. female  Chief Complaint  Patient presents with   Hypertension   Hyperlipidemia   Mood   HYPERTENSION / HYPERLIPIDEMIA Continues on Amlodipine, Rosuvastatin + Plavix & ASA.  Has history of stroke and is to follow with vascular as needed.   Satisfied with current treatment? yes Duration of hypertension: chronic BP monitoring frequency: not checking BP range: not checking BP medication side effects: no Past BP meds: none Duration of hyperlipidemia: chronic Cholesterol medication side effects: no Cholesterol supplements: none Past cholesterol medications: none Medication compliance: good compliance Aspirin: yes Recent stressors: no Recurrent headaches: no Visual changes: no Palpitations: no Dyspnea: no Chest pain: no Lower extremity edema: no Dizzy/lightheaded: no  The ASCVD Risk score (Arnett DK, et al., 2019) failed to calculate for the following reasons:   The valid total cholesterol range is 130 to 320 mg/dL  ANXIETY/STRESS Taking Celexa 10 MG daily and Buspar 5 MG BID as needed.  In past had taken benzo, which she reports helped.  Discussed at length with her these are not recommended in her age group.  History of low B12 and Vitamin D, not taking supplements at this time.  Has Trazodone for sleep, not often taking.  Does smoke occasional MJ to help with this at times.  Has a strong faith, which helps her through each day.  History of elevation in A1c, recent was 5.5% February. Duration: stable Anxious mood: no Excessive worrying: no Irritability: no  Sweating: no Nausea: no Palpitations:no Hyperventilation:  no Panic attacks: none Agoraphobia: no  Obscessions/compulsions: no Depressed mood: no    06-03-2022    1:11 PM 02/08/2022   10:15 AM 11/06/2021   12:42 PM 06/08/2021    9:26 AM 12/27/2020   10:29 AM  Depression screen PHQ 2/9  Decreased Interest 0 0 0 0 0  Down, Depressed, Hopeless 0 0 0 0 0  PHQ - 2 Score 0 0 0 0 0  Altered sleeping 2 0  2 0  Tired, decreased energy 0 0  0 0  Change in appetite 0 0  0 0  Feeling bad or failure about yourself  0 0  0 0  Trouble concentrating 0 0  0 0  Moving slowly or fidgety/restless 0 0  0 0  Suicidal thoughts 0 0  0 0  PHQ-9 Score 2 0  2 0  Difficult doing work/chores Not difficult at all Not difficult at all   Not difficult at all  Anhedonia: no Weight changes: no Insomnia: none Hypersomnia: no Fatigue/loss of energy: no Feelings of worthlessness: no Feelings of guilt: no Impaired concentration/indecisiveness: none Suicidal ideations: no  Crying spells: no Recent Stressors/Life Changes: yes - family   Relationship problems: no   Family stress: none   Financial stress: no    Job stress: no    Recent death/loss: no    June 03, 2022    1:12 PM 02/08/2022   10:15 AM 06/08/2021    9:26 AM 12/05/2020   11:42 AM  GAD 7 :  Generalized Anxiety Score  Nervous, Anxious, on Edge 0 0 0 0  Control/stop worrying 0 0 0 0  Worry too much - different things 0 0 0 0  Trouble relaxing 0 0 0 0  Restless 0 0 0 0  Easily annoyed or irritable 0 0 0 0  Afraid - awful might happen 0 0 0 0  Total GAD 7 Score 0 0 0 0  Anxiety Difficulty Not difficult at all Not difficult at all Not difficult at all Not difficult at all   Relevant past medical, surgical, family and social history reviewed and updated as indicated. Interim medical history since our last visit reviewed. Allergies and medications reviewed and updated.  Review of Systems  Constitutional:  Negative for activity change, appetite change, diaphoresis, fatigue and fever.  Respiratory:  Negative for  cough, chest tightness and shortness of breath.   Cardiovascular:  Negative for chest pain, palpitations and leg swelling.  Gastrointestinal: Negative.   Endocrine: Negative.   Neurological: Negative.   Psychiatric/Behavioral: Negative.     Per HPI unless specifically indicated above     Objective:    BP 128/78 (BP Location: Left Arm, Patient Position: Sitting, Cuff Size: Normal)   Pulse 81   Temp (!) 97.5 F (36.4 C) (Oral)   Resp 18   Ht 5' 3.5" (1.613 m)   Wt 150 lb 9.6 oz (68.3 kg)   SpO2 94%   BMI 26.26 kg/m   Wt Readings from Last 3 Encounters:  05/18/22 150 lb 9.6 oz (68.3 kg)  02/08/22 153 lb 6.4 oz (69.6 kg)  01/29/22 154 lb (69.9 kg)    Physical Exam Vitals and nursing note reviewed.  Constitutional:      General: She is awake. She is not in acute distress.    Appearance: She is well-developed and well-groomed. She is not ill-appearing.  HENT:     Head: Normocephalic and atraumatic. Hair is normal.     Right Ear: Hearing normal.     Left Ear: Hearing normal.  Eyes:     General: Lids are normal.     Extraocular Movements: Extraocular movements intact.     Pupils: Pupils are equal, round, and reactive to light.  Neck:     Thyroid: No thyromegaly.     Vascular: No carotid bruit or JVD.  Cardiovascular:     Rate and Rhythm: Normal rate and regular rhythm.     Heart sounds: Normal heart sounds. No murmur heard.    No gallop.  Pulmonary:     Effort: Pulmonary effort is normal.     Breath sounds: Normal breath sounds.  Abdominal:     General: Bowel sounds are normal.     Palpations: Abdomen is soft. There is no hepatomegaly or splenomegaly.  Musculoskeletal:     Cervical back: Normal range of motion and neck supple.     Right lower leg: No edema.     Left lower leg: No edema.  Lymphadenopathy:     Cervical: No cervical adenopathy.  Skin:    General: Skin is warm and dry.  Neurological:     Mental Status: She is alert and oriented to person, place, and  time.  Psychiatric:        Attention and Perception: Attention normal.        Mood and Affect: Mood normal.        Behavior: Behavior normal. Behavior is cooperative.        Thought Content: Thought content normal.  Judgment: Judgment normal.    Results for orders placed or performed during the hospital encounter of 35/00/93  Basic metabolic panel  Result Value Ref Range   Sodium 139 135 - 145 mmol/L   Potassium 3.8 3.5 - 5.1 mmol/L   Chloride 105 98 - 111 mmol/L   CO2 27 22 - 32 mmol/L   Glucose, Bld 117 (H) 70 - 99 mg/dL   BUN 10 8 - 23 mg/dL   Creatinine, Ser 0.78 0.44 - 1.00 mg/dL   Calcium 8.9 8.9 - 10.3 mg/dL   GFR, Estimated >60 >60 mL/min   Anion gap 7 5 - 15  CBC  Result Value Ref Range   WBC 7.6 4.0 - 10.5 K/uL   RBC 4.44 3.87 - 5.11 MIL/uL   Hemoglobin 13.2 12.0 - 15.0 g/dL   HCT 41.5 36.0 - 46.0 %   MCV 93.5 80.0 - 100.0 fL   MCH 29.7 26.0 - 34.0 pg   MCHC 31.8 30.0 - 36.0 g/dL   RDW 13.0 11.5 - 15.5 %   Platelets 335 150 - 400 K/uL   nRBC 0.0 0.0 - 0.2 %  Troponin I (High Sensitivity)  Result Value Ref Range   Troponin I (High Sensitivity) 5 <18 ng/L      Assessment & Plan:   Problem List Items Addressed This Visit       Cardiovascular and Mediastinum   Aortic atherosclerosis ([place]) - Primary (Chronic)    Chronic.  Noted on past CT abdomen 08/29/2017.  Recommend she continue ASA and statin daily for prevention.      Relevant Medications   amLODipine (NORVASC) 5 MG tablet   rosuvastatin (CRESTOR) 10 MG tablet   Other Relevant Orders   Basic metabolic panel   Lipid Panel w/o Chol/HDL Ratio   Hypertension (Chronic)    Chronic, stable with BP at goal in office today.  Continue current medication regimen and adjust as needed.  Labs today: BMP.  Recommend checking BP at home regularly + focus on DASH diet.  Refills sent up to date.  Return in 6 months.      Relevant Medications   amLODipine (NORVASC) 5 MG tablet   rosuvastatin (CRESTOR) 10 MG  tablet   Other Relevant Orders   Basic metabolic panel   Stenosis of left carotid artery (Chronic)    Ongoing, continue collaboration with vascular + current antiplatelet regimen. Recommend she follow-up with vascular.      Relevant Medications   amLODipine (NORVASC) 5 MG tablet   rosuvastatin (CRESTOR) 10 MG tablet   RESOLVED: Aortic valve sclerosis   Relevant Medications   amLODipine (NORVASC) 5 MG tablet   rosuvastatin (CRESTOR) 10 MG tablet     Other   Elevated hemoglobin A1c (Chronic)    Recheck A1C today, no symptoms reported.      Relevant Orders   HgB A1c   History of cerebellar stroke (Chronic)    History of old infarct.  Highly recommend she continue her statin and ASA daily.      Hyperlipidemia (Chronic)    Chronic, ongoing.  Continue current medication regimen and adjust as needed.  Lipid panel today.  Return in 6 months.      Relevant Medications   amLODipine (NORVASC) 5 MG tablet   rosuvastatin (CRESTOR) 10 MG tablet   Other Relevant Orders   Lipid Panel w/o Chol/HDL Ratio   Insomnia (Chronic)    Chronic, ongoing.  Will continue Trazodone, has taken this in past without issue and  she prefers something non addictive.  This is safer for her age group.  She has tried OTC Melatonin without benefit.  Discussed need to focus on sleep hygiene techniques and improvement, cutting off television near bedtime.  Meditation.  Return in 6 months.      Situational anxiety (Chronic)    Ongoing and chronic. Denies SI/HI.  Continue Celexa and Buspar which offer her benefit.  Would avoid benzo use due to age and risk for chronic use.  Does not wish to attend psychiatry.  Return in 6 months.      Relevant Medications   busPIRone (BUSPAR) 5 MG tablet   citalopram (CELEXA) 10 MG tablet   Vitamin B12 deficiency (Chronic)    Chronic, noted on past labs.  Recommend she take Vitamin B12 1000 MCG daily, recheck today.      Relevant Orders   Vitamin B12   Vitamin D deficiency  (Chronic)    Chronic, noted on past labs.  Recommend she take Vitamin D3 2000 units daily.        Follow up plan: Return in about 6 months (around 11/17/2022) for HTN/HLD, MOOD, B12, INSOMNIA.

## 2022-05-18 NOTE — Assessment & Plan Note (Signed)
Chronic, noted on past labs.  Recommend she take Vitamin B12 1000 MCG daily, recheck today.

## 2022-05-18 NOTE — Assessment & Plan Note (Signed)
Chronic, noted on past labs.  Recommend she take Vitamin D3 2000 units daily.

## 2022-05-18 NOTE — Assessment & Plan Note (Signed)
History of old infarct.  Highly recommend she continue her statin and ASA daily.

## 2022-05-19 LAB — BASIC METABOLIC PANEL
BUN/Creatinine Ratio: 20 (ref 12–28)
BUN: 14 mg/dL (ref 8–27)
CO2: 21 mmol/L (ref 20–29)
Calcium: 9.8 mg/dL (ref 8.7–10.3)
Chloride: 102 mmol/L (ref 96–106)
Creatinine, Ser: 0.69 mg/dL (ref 0.57–1.00)
Glucose: 107 mg/dL — ABNORMAL HIGH (ref 70–99)
Potassium: 4.1 mmol/L (ref 3.5–5.2)
Sodium: 138 mmol/L (ref 134–144)
eGFR: 88 mL/min/{1.73_m2} (ref >59)

## 2022-05-19 LAB — LIPID PANEL W/O CHOL/HDL RATIO
Cholesterol, Total: 147 mg/dL (ref 100–199)
HDL: 43 mg/dL (ref >39)
LDL Chol Calc (NIH): 73 mg/dL (ref 0–99)
Triglycerides: 184 mg/dL — ABNORMAL HIGH (ref 0–149)
VLDL Cholesterol Cal: 31 mg/dL (ref 5–40)

## 2022-05-19 LAB — HEMOGLOBIN A1C
Est. average glucose Bld gHb Est-mCnc: 123 mg/dL
Hgb A1c MFr Bld: 5.9 % — ABNORMAL HIGH (ref 4.8–5.6)

## 2022-05-19 LAB — VITAMIN B12: Vitamin B-12: 403 pg/mL (ref 232–1245)

## 2022-05-20 NOTE — Progress Notes (Signed)
Good morning, please let Angy know labs have returned and no medication changes are needed.  Continue all current regimen and we will recheck next visit.  Any questions? Keep being awesome!!  Thank you for allowing me to participate in your care.  I appreciate you. Kindest regards, Jmari Pelc

## 2022-05-24 ENCOUNTER — Telehealth: Payer: Self-pay | Admitting: Nurse Practitioner

## 2022-05-24 DIAGNOSIS — Z1231 Encounter for screening mammogram for malignant neoplasm of breast: Secondary | ICD-10-CM

## 2022-05-24 NOTE — Telephone Encounter (Signed)
Copied from [place] [phone]. Topic: General - Other >> May 24, 2022  9:21 AM Chapman Fitch wrote: Reason for CRM: got a letter to get a pap and mammogram / pt would like an order for a mammogram / Pt asked if at her age does [name] think she needs a PAP / please advise

## 2022-05-24 NOTE — Telephone Encounter (Signed)
Patient made aware of results and verbalized understanding.  

## 2022-06-12 ENCOUNTER — Ambulatory Visit
Admission: RE | Admit: 2022-06-12 | Discharge: 2022-06-12 | Disposition: A | Discharge status: Home / Self Care | Payer: 59 | Source: Ambulatory Visit | Attending: Nurse Practitioner | Admitting: Nurse Practitioner

## 2022-06-12 DIAGNOSIS — Z1231 Encounter for screening mammogram for malignant neoplasm of breast: Secondary | ICD-10-CM | POA: Diagnosis not present

## 2022-08-29 ENCOUNTER — Other Ambulatory Visit: Payer: Self-pay

## 2022-08-29 MED ORDER — CITALOPRAM HYDROBROMIDE 10 MG PO TABS: 10.0000 mg | ORAL_TABLET | Freq: Every day | ORAL | 4 refills | Status: DC

## 2022-08-29 MED ORDER — TRAZODONE HCL 50 MG PO TABS: 25.0000 mg | ORAL_TABLET | Freq: Every evening | ORAL | 5 refills | Status: DC | PRN

## 2022-08-29 MED ORDER — CLOPIDOGREL BISULFATE 75 MG PO TABS: ORAL_TABLET | ORAL | 4 refills | Status: DC

## 2022-08-29 MED ORDER — BUSPIRONE HCL 5 MG PO TABS: 5.0000 mg | ORAL_TABLET | Freq: Two times a day (BID) | ORAL | 4 refills | Status: DC

## 2022-08-29 NOTE — Telephone Encounter (Signed)
Medication refill for Trazodone, citalopram, Clopidogrel, and buspirone last ov 05/18/22, upcoming ov 11/19/22 . Please advise

## 2022-09-10 ENCOUNTER — Other Ambulatory Visit: Payer: Self-pay

## 2022-09-10 MED ORDER — AMLODIPINE BESYLATE 5 MG PO TABS: 5.0000 mg | ORAL_TABLET | Freq: Every day | ORAL | 4 refills | Status: DC

## 2022-09-10 NOTE — Telephone Encounter (Signed)
Medication refill for Amlodipine 5 mg last ov 05/18/22, upcoming ov 11/19/22 . Please advise

## 2022-09-19 ENCOUNTER — Other Ambulatory Visit: Payer: Self-pay

## 2022-09-19 MED ORDER — BUSPIRONE HCL 5 MG PO TABS: 5.0000 mg | ORAL_TABLET | Freq: Two times a day (BID) | ORAL | 4 refills | Status: DC

## 2022-09-19 MED ORDER — TRAZODONE HCL 50 MG PO TABS: 25.0000 mg | ORAL_TABLET | Freq: Every evening | ORAL | 5 refills | Status: DC | PRN

## 2022-09-19 MED ORDER — CLOPIDOGREL BISULFATE 75 MG PO TABS: ORAL_TABLET | ORAL | 4 refills | Status: DC

## 2022-09-19 MED ORDER — ROSUVASTATIN CALCIUM 10 MG PO TABS: 10.0000 mg | ORAL_TABLET | Freq: Every day | ORAL | 4 refills | Status: DC

## 2022-09-19 MED ORDER — CITALOPRAM HYDROBROMIDE 10 MG PO TABS: 10.0000 mg | ORAL_TABLET | Freq: Every day | ORAL | 4 refills | Status: DC

## 2022-09-19 MED ORDER — AMLODIPINE BESYLATE 5 MG PO TABS: 5.0000 mg | ORAL_TABLET | Freq: Every day | ORAL | 4 refills | Status: DC

## 2022-09-19 NOTE — Telephone Encounter (Signed)
Patient has changed pharm from [facility] to mail order [facility]

## 2022-11-19 ENCOUNTER — Ambulatory Visit: Payer: 59 | Admitting: Nurse Practitioner

## 2022-11-19 DIAGNOSIS — E782 Mixed hyperlipidemia: Secondary | ICD-10-CM

## 2022-11-19 DIAGNOSIS — E538 Deficiency of other specified B group vitamins: Secondary | ICD-10-CM

## 2022-11-19 DIAGNOSIS — I700 Atherosclerosis of aorta: Secondary | ICD-10-CM

## 2022-11-19 DIAGNOSIS — I10 Essential (primary) hypertension: Secondary | ICD-10-CM

## 2022-11-19 DIAGNOSIS — R7309 Other abnormal glucose: Secondary | ICD-10-CM

## 2022-11-19 DIAGNOSIS — E559 Vitamin D deficiency, unspecified: Secondary | ICD-10-CM

## 2022-11-19 DIAGNOSIS — F5104 Psychophysiologic insomnia: Secondary | ICD-10-CM

## 2022-11-19 DIAGNOSIS — F418 Other specified anxiety disorders: Secondary | ICD-10-CM

## 2022-11-19 DIAGNOSIS — Z8673 Personal history of transient ischemic attack (TIA), and cerebral infarction without residual deficits: Secondary | ICD-10-CM

## 2022-11-19 DIAGNOSIS — Z780 Asymptomatic menopausal state: Secondary | ICD-10-CM

## 2022-11-19 DIAGNOSIS — I6522 Occlusion and stenosis of left carotid artery: Secondary | ICD-10-CM

## 2022-12-01 NOTE — Patient Instructions (Signed)

## 2022-12-04 ENCOUNTER — Encounter: Payer: Self-pay | Admitting: Nurse Practitioner

## 2022-12-04 ENCOUNTER — Ambulatory Visit (INDEPENDENT_AMBULATORY_CARE_PROVIDER_SITE_OTHER): Payer: 59 | Admitting: Nurse Practitioner

## 2022-12-04 VITALS — BP 118/73 | HR 92 | Temp 97.9°F | Ht 63.5 in | Wt 149.8 lb

## 2022-12-04 DIAGNOSIS — F1290 Cannabis use, unspecified, uncomplicated: Secondary | ICD-10-CM

## 2022-12-04 DIAGNOSIS — Z0000 Encounter for general adult medical examination without abnormal findings: Secondary | ICD-10-CM | POA: Diagnosis not present

## 2022-12-04 DIAGNOSIS — E782 Mixed hyperlipidemia: Secondary | ICD-10-CM | POA: Diagnosis not present

## 2022-12-04 DIAGNOSIS — E538 Deficiency of other specified B group vitamins: Secondary | ICD-10-CM | POA: Diagnosis not present

## 2022-12-04 DIAGNOSIS — R7309 Other abnormal glucose: Secondary | ICD-10-CM

## 2022-12-04 DIAGNOSIS — Z8673 Personal history of transient ischemic attack (TIA), and cerebral infarction without residual deficits: Secondary | ICD-10-CM | POA: Diagnosis not present

## 2022-12-04 DIAGNOSIS — I700 Atherosclerosis of aorta: Secondary | ICD-10-CM

## 2022-12-04 DIAGNOSIS — E559 Vitamin D deficiency, unspecified: Secondary | ICD-10-CM

## 2022-12-04 DIAGNOSIS — F5104 Psychophysiologic insomnia: Secondary | ICD-10-CM

## 2022-12-04 DIAGNOSIS — I10 Essential (primary) hypertension: Secondary | ICD-10-CM

## 2022-12-04 DIAGNOSIS — F418 Other specified anxiety disorders: Secondary | ICD-10-CM

## 2022-12-04 LAB — MICROALBUMIN, URINE WAIVED
Creatinine, Urine Waived: 200 mg/dL (ref 10–300)
Microalb, Ur Waived: 80 mg/L — ABNORMAL HIGH (ref 0–19)

## 2022-12-04 LAB — BAYER DCA HB A1C WAIVED: HB A1C (BAYER DCA - WAIVED): 5.9 % — ABNORMAL HIGH (ref 4.8–5.6)

## 2022-12-04 NOTE — Assessment & Plan Note (Signed)
Chronic, stable.  BP at goal in office today.  Continue current medication regimen and adjust as needed.  Labs today: CBC, CMP, TSH, urine ALB.  Recommend checking BP at home regularly + focus on DASH diet.  Refills up to date.  Return in 6 months.

## 2022-12-04 NOTE — Assessment & Plan Note (Signed)
Chronic, ongoing.  Recommend she take Vitamin D3 2000 units consistently daily.

## 2022-12-04 NOTE — Assessment & Plan Note (Signed)
Recheck A1c today, no symptoms reported.  Last check 5.9%.  A1c recheck today.

## 2022-12-04 NOTE — Assessment & Plan Note (Signed)
 Chronic, ongoing.  Will continue Trazodone, has taken this in past without issue and she prefers something non addictive.  This is safer for her age group.  She has tried OTC Melatonin without benefit.  Discussed need to focus on sleep hygiene techniques and improvement, cutting off television near bedtime.  Meditation.  Return in 6 months.
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
## 2022-12-04 NOTE — Assessment & Plan Note (Signed)
Reports occasional use.  Recommend cutting back on this.  Continue to recommend no use.

## 2022-12-04 NOTE — Assessment & Plan Note (Signed)
 Chronic.  Noted on past CT abdomen 08/29/2017.  Recommend she continue ASA and statin daily for prevention.

## 2022-12-04 NOTE — Assessment & Plan Note (Signed)
History of old infarct.  Highly recommend she continue her statin and ASA daily. 

## 2022-12-04 NOTE — Assessment & Plan Note (Signed)
Chronic, ongoing.  Continue current medication regimen and adjust as needed.  Lipid panel today.  Return in 6 months. 

## 2022-12-04 NOTE — Assessment & Plan Note (Signed)
Ongoing and chronic. Denies SI/HI.  Continue Celexa and Buspar which offer her benefit.  Would avoid benzo use due to age and risk for chronic use.  Does not wish to attend psychiatry.  Return in 6 months. 

## 2022-12-04 NOTE — Assessment & Plan Note (Signed)
Chronic, ongoing.  Recommend she take Vitamin B12 1000 MCG daily, recheck today.

## 2022-12-05 NOTE — Progress Notes (Signed)
Please let Mack know that overall labs look good with exception of mild elevation in calcium and low Vitamin D.  Please ensure you are taking your Vitamin D3 2000 units daily. Continue all other medications as ordered please:) Keep being amazing!!  Thank you for allowing me to participate in your care.  I appreciate you. Kindest regards, Evolet Salminen

## 2022-12-06 LAB — CBC WITH DIFFERENTIAL/PLATELET
Basophils Absolute: 0.1 10*3/uL (ref 0.0–0.2)
Basos: 1 %
EOS (ABSOLUTE): 0.1 10*3/uL (ref 0.0–0.4)
Eos: 1 %
Hematocrit: 40.8 % (ref 34.0–46.6)
Hemoglobin: 13.6 g/dL (ref 11.1–15.9)
Immature Grans (Abs): 0 10*3/uL (ref 0.0–0.1)
Immature Granulocytes: 0 %
Lymphocytes Absolute: 2.7 10*3/uL (ref 0.7–3.1)
Lymphs: 27 %
MCH: 30.8 pg (ref 26.6–33.0)
MCHC: 33.3 g/dL (ref 31.5–35.7)
MCV: 92 fL (ref 79–97)
Monocytes Absolute: 0.9 10*3/uL (ref 0.1–0.9)
Monocytes: 9 %
Neutrophils Absolute: 6.2 10*3/uL (ref 1.4–7.0)
Neutrophils: 62 %
Platelets: 422 10*3/uL (ref 150–450)
RBC: 4.42 x10E6/uL (ref 3.77–5.28)
RDW: 12.6 % (ref 11.7–15.4)
WBC: 10 10*3/uL (ref 3.4–10.8)

## 2022-12-06 LAB — LIPID PANEL W/O CHOL/HDL RATIO
Cholesterol, Total: 151 mg/dL (ref 100–199)
HDL: 47 mg/dL (ref >39)
LDL Chol Calc (NIH): 68 mg/dL (ref 0–99)
Triglycerides: 224 mg/dL — ABNORMAL HIGH (ref 0–149)
VLDL Cholesterol Cal: 36 mg/dL (ref 5–40)

## 2022-12-06 LAB — COMPREHENSIVE METABOLIC PANEL
ALT: 18 IU/L (ref 0–32)
AST: 21 IU/L (ref 0–40)
Albumin: 4.4 g/dL (ref 3.8–4.8)
Alkaline Phosphatase: 83 IU/L (ref 44–121)
BUN/Creatinine Ratio: 22 (ref 12–28)
BUN: 17 mg/dL (ref 8–27)
Bilirubin Total: 0.4 mg/dL (ref 0.0–1.2)
CO2: 21 mmol/L (ref 20–29)
Calcium: 10.4 mg/dL — ABNORMAL HIGH (ref 8.7–10.3)
Chloride: 103 mmol/L (ref 96–106)
Creatinine, Ser: 0.76 mg/dL (ref 0.57–1.00)
Globulin, Total: 2.5 g/dL (ref 1.5–4.5)
Glucose: 102 mg/dL — ABNORMAL HIGH (ref 70–99)
Potassium: 4.2 mmol/L (ref 3.5–5.2)
Sodium: 141 mmol/L (ref 134–144)
Total Protein: 6.9 g/dL (ref 6.0–8.5)
eGFR: 79 mL/min/{1.73_m2} (ref >59)

## 2022-12-06 LAB — TSH: TSH: 3.83 u[IU]/mL (ref 0.450–4.500)

## 2022-12-06 LAB — VITAMIN B12: Vitamin B-12: 334 pg/mL (ref 232–1245)

## 2022-12-06 LAB — VITAMIN D 25 HYDROXY (VIT D DEFICIENCY, FRACTURES): Vit D, 25-Hydroxy: 21.2 ng/mL — ABNORMAL LOW (ref 30.0–100.0)

## 2022-12-06 NOTE — Progress Notes (Signed)
Please let Dedria know that overall labs look good with exception of mild elevation in calcium and low Vitamin D.  Please ensure you are taking your Vitamin D3 2000 units daily + taking Vitamin B12 1000 MCG daily as this remains a little low. Continue all other medications as ordered please:) Keep being amazing!!  Thank you for allowing me to participate in your care.  I appreciate you. Kindest regards, Dorse Locy

## 2022-12-24 ENCOUNTER — Ambulatory Visit: Payer: Self-pay | Admitting: *Deleted

## 2022-12-24 NOTE — Telephone Encounter (Signed)
  Chief Complaint: Blood in sputum Symptoms: States one episode of nausea,vomiting today, "Undigested food" States rinsed mouth, noted bright red streak of blood. Pt on Plavix Frequency: today Pertinent Negatives: Patient denies any bleeding elsewhere, dizziness, no longer nauseated. Disposition: [] ED /[] Urgent Care (no appt availability in office) / [x] Appointment(In office/virtual)/ []  [facility]/ [] Home Care/ [] Refused Recommended Disposition /[] [facility]/ []  Follow-up with PCP Additional Notes: Appt secured for tomorrow, care advise provided, pt verbalizes understanding.  Reason for Disposition  Vomiting (without blood) persists > 48 hours    One episode only, on Plavix.  Answer Assessment - Initial Assessment Questions 1. APPEARANCE of BLOOD: "What does the blood look like?" (e.g., pink, red blood, coffee-grounds)     Bright red 2. AMOUNT: "How much blood was lost?" (e.g., few streaks or strands, tablespoon, cup)     Streak 3. VOMITING BLOOD: "How many times did it happen?" or "How many times in the past 24 hours?"     one 4. VOMITING WITHOUT BLOOD: "How many times in the past 24 hours?"      None 5. ONSET: "When did vomiting of blood begin?"     One episode this afternoon 6. CAUSE: "What do you think is causing the vomiting of blood?"     "Maybe my teeth" 7. BLOOD THINNERS: "Do you take any blood thinners?" (e.g., Coumadin/warfarin, Pradaxa/dabigatran, aspirin)      8. DEHYDRATION: "Are there any signs of dehydration?" "When was the last time you urinated?" "Do you feel dizzy?"     no 9. ABDOMEN PAIN: "Are you having any abdomen pain?" If Yes, ask: "What does it feel like? " (e.g., crampy, dull, intermittent, constant)      none 10. DIARRHEA: "Is there any diarrhea?" If Yes, ask: "How many times today?"        no 11. OTHER SYMPTOMS: "Do you have any other symptoms?" (e.g., fever, blood in stool)       Nausea prior to vomiting  Protocols used:  Vomiting Blood-A-AH

## 2022-12-25 ENCOUNTER — Encounter: Payer: Self-pay | Admitting: Family Medicine

## 2022-12-25 ENCOUNTER — Ambulatory Visit (INDEPENDENT_AMBULATORY_CARE_PROVIDER_SITE_OTHER): Payer: 59 | Admitting: Family Medicine

## 2022-12-25 VITALS — BP 133/76 | HR 67 | Temp 97.6°F | Wt 152.4 lb

## 2022-12-25 DIAGNOSIS — K920 Hematemesis: Secondary | ICD-10-CM | POA: Diagnosis not present

## 2022-12-25 NOTE — Progress Notes (Signed)
BP 133/76   Pulse 67   Temp 97.6 F (36.4 C) (Oral)   Wt 152 lb 6.4 oz (69.1 kg)   SpO2 93%   BMI 26.57 kg/m    Subjective:    Patient ID: Tina Hardy, female    DOB: 10/22/1940, 82 y.o.   MRN: [id number]  HPI: Elizia Paolo is a 82 y.o. female  Chief Complaint  Patient presents with   Emesis    With a blood   HEMATEMESIS She complains of x1 episode of large amount of emesis yesterday after eating, she saw a bright red blood streak in emesis. She denies being aware of anything that triggered the vomiting, denies cough. After emesis reports having bowel movement, and felt better. She is taking Plavix daily.   Treatments attempted: antacids TUMs helps.  Constipation: no Diarrhea: no Heartburn: yes Bloating:no Flatulence: yes Nausea: no Vomiting: yes Episodes of vomit/day: x1 occurrence Melena or hematochezia:No Rash: no Jaundice: no Fever: no Weight loss: no   Relevant past medical, surgical, family and social history reviewed and updated as indicated. Interim medical history since our last visit reviewed. Allergies and medications reviewed and updated.  Review of Systems  Respiratory: Negative.    Cardiovascular: Negative.   Gastrointestinal:  Positive for vomiting. Negative for blood in stool, constipation and diarrhea.  Genitourinary:  Negative for hematuria.       Hematemesis  Hematological:  Does not bruise/bleed easily.    Per HPI unless specifically indicated above     Objective:    BP 133/76   Pulse 67   Temp 97.6 F (36.4 C) (Oral)   Wt 152 lb 6.4 oz (69.1 kg)   SpO2 93%   BMI 26.57 kg/m   Wt Readings from Last 3 Encounters:  12/25/22 152 lb 6.4 oz (69.1 kg)  12/04/22 149 lb 12.8 oz (67.9 kg)  05/18/22 150 lb 9.6 oz (68.3 kg)    Physical Exam Vitals and nursing note reviewed.  Constitutional:      General: She is awake. She is not in acute distress.    Appearance: Normal appearance. She is well-developed and well-groomed. She  is not ill-appearing.  HENT:     Head: Normocephalic and atraumatic.     Right Ear: Hearing and external ear normal. No drainage.     Left Ear: Hearing and external ear normal. No drainage.     Nose: Nose normal.  Eyes:     General: Lids are normal.        Right eye: No discharge.        Left eye: No discharge.     Conjunctiva/sclera: Conjunctivae normal.  Cardiovascular:     Rate and Rhythm: Normal rate and regular rhythm.     Pulses:          Radial pulses are 2+ on the right side and 2+ on the left side.       Posterior tibial pulses are 2+ on the right side and 2+ on the left side.     Heart sounds: S1 normal and S2 normal. Murmur heard.     No gallop.  Pulmonary:     Effort: Pulmonary effort is normal. No accessory muscle usage or respiratory distress.     Breath sounds: Normal breath sounds.  Abdominal:     General: Bowel sounds are normal.     Tenderness: There is no abdominal tenderness.  Musculoskeletal:        General: Normal range of motion.  Cervical back: Full passive range of motion without pain and normal range of motion.     Right lower leg: No edema.     Left lower leg: No edema.  Skin:    General: Skin is warm and dry.     Capillary Refill: Capillary refill takes less than 2 seconds.  Neurological:     Mental Status: She is alert and oriented to person, place, and time.  Psychiatric:        Attention and Perception: Attention normal.        Mood and Affect: Mood normal.        Speech: Speech normal.        Behavior: Behavior normal. Behavior is cooperative.        Thought Content: Thought content normal.     Results for orders placed or performed in visit on 12/04/22  Bayer DCA Hb A1c Waived  Result Value Ref Range   HB A1C (BAYER DCA - WAIVED) 5.9 (H) 4.8 - 5.6 %  Microalbumin, Urine Waived  Result Value Ref Range   Microalb, Ur Waived 80 (H) 0 - 19 mg/L   Creatinine, Urine Waived 200 10 - 300 mg/dL   Microalb/Creat Ratio 30-300 (H) <30 mg/g   CBC with Differential/Platelet  Result Value Ref Range   WBC 10.0 3.4 - 10.8 x10E3/uL   RBC 4.42 3.77 - 5.28 x10E6/uL   Hemoglobin 13.6 11.1 - 15.9 g/dL   Hematocrit 29.5 62.1 - 46.6 %   MCV 92 79 - 97 fL   MCH 30.8 26.6 - 33.0 pg   MCHC 33.3 31.5 - 35.7 g/dL   RDW 30.8 65.7 - 84.6 %   Platelets 422 150 - 450 x10E3/uL   Neutrophils 62 Not Estab. %   Lymphs 27 Not Estab. %   Monocytes 9 Not Estab. %   Eos 1 Not Estab. %   Basos 1 Not Estab. %   Neutrophils Absolute 6.2 1.4 - 7.0 x10E3/uL   Lymphocytes Absolute 2.7 0.7 - 3.1 x10E3/uL   Monocytes Absolute 0.9 0.1 - 0.9 x10E3/uL   EOS (ABSOLUTE) 0.1 0.0 - 0.4 x10E3/uL   Basophils Absolute 0.1 0.0 - 0.2 x10E3/uL   Immature Granulocytes 0 Not Estab. %   Immature Grans (Abs) 0.0 0.0 - 0.1 x10E3/uL  Comprehensive metabolic panel  Result Value Ref Range   Glucose 102 (H) 70 - 99 mg/dL   BUN 17 8 - 27 mg/dL   Creatinine, Ser 9.62 0.57 - 1.00 mg/dL   eGFR 79 >95 MW/UXL/2.44   BUN/Creatinine Ratio 22 12 - 28   Sodium 141 134 - 144 mmol/L   Potassium 4.2 3.5 - 5.2 mmol/L   Chloride 103 96 - 106 mmol/L   CO2 21 20 - 29 mmol/L   Calcium 10.4 (H) 8.7 - 10.3 mg/dL   Total Protein 6.9 6.0 - 8.5 g/dL   Albumin 4.4 3.8 - 4.8 g/dL   Globulin, Total 2.5 1.5 - 4.5 g/dL   Bilirubin Total 0.4 0.0 - 1.2 mg/dL   Alkaline Phosphatase 83 44 - 121 IU/L   AST 21 0 - 40 IU/L   ALT 18 0 - 32 IU/L  Lipid Panel w/o Chol/HDL Ratio  Result Value Ref Range   Cholesterol, Total 151 100 - 199 mg/dL   Triglycerides 010 (H) 0 - 149 mg/dL   HDL 47 >27 mg/dL   VLDL Cholesterol Cal 36 5 - 40 mg/dL   LDL Chol Calc (NIH) 68 0 - 99 mg/dL  TSH  Result Value Ref Range   TSH 3.830 0.450 - 4.500 uIU/mL  VITAMIN D 25 Hydroxy (Vit-D Deficiency, Fractures)  Result Value Ref Range   Vit D, 25-Hydroxy 21.2 (L) 30.0 - 100.0 ng/mL  Vitamin B12  Result Value Ref Range   Vitamin B-12 334 232 - 1,245 pg/mL      Assessment & Plan:   Problem List Items Addressed  This Visit   None Visit Diagnoses     Hematemesis with nausea    -  Primary   Acute, stable. CBC done today to rule out anemia. F/u if symptoms reoccur or worsen.   Relevant Orders   CBC w/Diff        Follow up plan: Return if symptoms worsen or fail to improve.

## 2022-12-27 NOTE — Progress Notes (Signed)
Hi Britt, Can you let Ms. Hegarty know her blood results came back normal. Her hemoglobin and hematocrit were normal, no signs to indicate anemia or blood loss. Thank you!

## 2023-02-02 NOTE — Patient Instructions (Signed)

## 2023-02-04 ENCOUNTER — Ambulatory Visit (INDEPENDENT_AMBULATORY_CARE_PROVIDER_SITE_OTHER): Payer: 59 | Admitting: Nurse Practitioner

## 2023-02-04 ENCOUNTER — Encounter: Payer: Self-pay | Admitting: Nurse Practitioner

## 2023-02-04 VITALS — BP 133/64 | HR 85 | Temp 98.0°F | Resp 18 | Ht 63.5 in | Wt 152.0 lb

## 2023-02-04 DIAGNOSIS — Z0289 Encounter for other administrative examinations: Secondary | ICD-10-CM | POA: Diagnosis not present

## 2023-02-04 DIAGNOSIS — I10 Essential (primary) hypertension: Secondary | ICD-10-CM | POA: Diagnosis not present

## 2023-02-04 NOTE — Assessment & Plan Note (Signed)
Chronic, stable.  BP at goal in office today.  Continue current medication regimen and adjust as needed.  Labs today: up to date.  Recommend checking BP at home regularly + focus on DASH diet.  Refills up to date.  Filled out forms for health aide.  Would benefit from this to get to appointments and assist in home.

## 2023-02-04 NOTE — Progress Notes (Signed)
BP 133/64   Pulse 85   Temp 98 F (36.7 C) (Oral)   Resp 18   Ht 5' 3.5" (1.613 m)   Wt 152 lb (68.9 kg)   SpO2 97%   BMI 26.50 kg/m    Subjective:    Patient ID: Tina Hardy, female    DOB: [birth year], 82 y.o.   MRN: [id number]  HPI: Tina Hardy is a 82 y.o. female  Chief Complaint  Patient presents with   Home Health     Patient states she is wants to discuss getting a home health aid    HYPERTENSION without Chronic Kidney Disease Continues Amlodipine, ASA, Plavix, and Crestor.  Would like to have home health aide, presents with one today at bedside.  Home health aide will come as needed -- get her to appointments and assist with needs, getting to store or home needs.  Forms to complete. Hypertension status: stable  Satisfied with current treatment? yes Duration of hypertension: chronic BP monitoring frequency:  not checking BP range:  BP medication side effects:  no Medication compliance: good compliance Aspirin: yes Recurrent headaches: no Visual changes: no Palpitations: no Dyspnea: no Chest pain: no Lower extremity edema: no Dizzy/lightheaded: no   Relevant past medical, surgical, family and social history reviewed and updated as indicated. Interim medical history since our last visit reviewed. Allergies and medications reviewed and updated.  Review of Systems  Constitutional:  Negative for activity change, appetite change, diaphoresis, fatigue and fever.  Respiratory:  Negative for cough, chest tightness and shortness of breath.   Cardiovascular:  Negative for chest pain, palpitations and leg swelling.  Gastrointestinal: Negative.   Endocrine: Negative.   Neurological: Negative.   Psychiatric/Behavioral: Negative.      Per HPI unless specifically indicated above     Objective:    BP 133/64   Pulse 85   Temp 98 F (36.7 C) (Oral)   Resp 18   Ht 5' 3.5" (1.613 m)   Wt 152 lb (68.9 kg)   SpO2 97%   BMI 26.50 kg/m   Wt Readings  from Last 3 Encounters:  02/04/23 152 lb (68.9 kg)  12/25/22 152 lb 6.4 oz (69.1 kg)  12/04/22 149 lb 12.8 oz (67.9 kg)    Physical Exam Vitals and nursing note reviewed.  Constitutional:      General: She is awake. She is not in acute distress.    Appearance: She is well-developed and well-groomed. She is not ill-appearing.  HENT:     Head: Normocephalic and atraumatic. Hair is normal.     Right Ear: Hearing normal.     Left Ear: Hearing normal.  Eyes:     General: Lids are normal.     Extraocular Movements: Extraocular movements intact.     Pupils: Pupils are equal, round, and reactive to light.  Neck:     Thyroid: No thyromegaly.     Vascular: No carotid bruit or JVD.  Cardiovascular:     Rate and Rhythm: Normal rate and regular rhythm.     Heart sounds: Normal heart sounds. No murmur heard.    No gallop.  Pulmonary:     Effort: Pulmonary effort is normal.     Breath sounds: Normal breath sounds.  Abdominal:     General: Bowel sounds are normal.     Palpations: Abdomen is soft. There is no hepatomegaly or splenomegaly.  Musculoskeletal:     Cervical back: Normal range of motion and neck supple.  Right lower leg: No edema.     Left lower leg: No edema.  Lymphadenopathy:     Cervical: No cervical adenopathy.  Skin:    General: Skin is warm and dry.  Neurological:     Mental Status: She is alert and oriented to person, place, and time.  Psychiatric:        Attention and Perception: Attention normal.        Mood and Affect: Mood normal.        Behavior: Behavior normal. Behavior is cooperative.        Thought Content: Thought content normal.        Judgment: Judgment normal.    Results for orders placed or performed in visit on 12/25/22  CBC w/Diff  Result Value Ref Range   WBC 8.6 3.4 - 10.8 x10E3/uL   RBC 4.33 3.77 - 5.28 x10E6/uL   Hemoglobin 13.1 11.1 - 15.9 g/dL   Hematocrit 21.3 08.6 - 46.6 %   MCV 94 79 - 97 fL   MCH 30.3 26.6 - 33.0 pg   MCHC 32.3  31.5 - 35.7 g/dL   RDW 57.8 46.9 - 62.9 %   Platelets 399 150 - 450 x10E3/uL   Neutrophils 57 Not Estab. %   Lymphs 33 Not Estab. %   Monocytes 8 Not Estab. %   Eos 1 Not Estab. %   Basos 1 Not Estab. %   Neutrophils Absolute 4.9 1.4 - 7.0 x10E3/uL   Lymphocytes Absolute 2.8 0.7 - 3.1 x10E3/uL   Monocytes Absolute 0.7 0.1 - 0.9 x10E3/uL   EOS (ABSOLUTE) 0.1 0.0 - 0.4 x10E3/uL   Basophils Absolute 0.1 0.0 - 0.2 x10E3/uL   Immature Granulocytes 0 Not Estab. %   Immature Grans (Abs) 0.0 0.0 - 0.1 x10E3/uL      Assessment & Plan:   Problem List Items Addressed This Visit       Cardiovascular and Mediastinum   Hypertension - Primary (Chronic)    Chronic, stable.  BP at goal in office today.  Continue current medication regimen and adjust as needed.  Labs today: up to date.  Recommend checking BP at home regularly + focus on DASH diet.  Refills up to date.  Filled out forms for health aide.  Would benefit from this to get to appointments and assist in home.      Other Visit Diagnoses     Encounter for completion of form with patient       Form completed in office today.        Follow up plan: Return for as scheduled in January.

## 2023-02-07 ENCOUNTER — Emergency Department (HOSPITAL_COMMUNITY): Payer: 59

## 2023-02-07 ENCOUNTER — Emergency Department (HOSPITAL_COMMUNITY)
Admission: EM | Admit: 2023-02-07 | Discharge: 2023-02-07 | Disposition: A | Discharge status: Home / Self Care | Payer: 59 | Attending: Emergency Medicine | Admitting: Emergency Medicine

## 2023-02-07 DIAGNOSIS — I10 Essential (primary) hypertension: Secondary | ICD-10-CM | POA: Insufficient documentation

## 2023-02-07 DIAGNOSIS — I700 Atherosclerosis of aorta: Secondary | ICD-10-CM | POA: Diagnosis not present

## 2023-02-07 DIAGNOSIS — R0789 Other chest pain: Secondary | ICD-10-CM | POA: Insufficient documentation

## 2023-02-07 DIAGNOSIS — R079 Chest pain, unspecified: Secondary | ICD-10-CM | POA: Diagnosis not present

## 2023-02-07 DIAGNOSIS — Z7982 Long term (current) use of aspirin: Secondary | ICD-10-CM | POA: Diagnosis not present

## 2023-02-07 DIAGNOSIS — Z79899 Other long term (current) drug therapy: Secondary | ICD-10-CM | POA: Diagnosis not present

## 2023-02-07 DIAGNOSIS — Z7902 Long term (current) use of antithrombotics/antiplatelets: Secondary | ICD-10-CM | POA: Diagnosis not present

## 2023-02-07 LAB — BASIC METABOLIC PANEL
Anion gap: 9 (ref 5–15)
BUN: 14 mg/dL (ref 8–23)
CO2: 25 mmol/L (ref 22–32)
Calcium: 9.2 mg/dL (ref 8.9–10.3)
Chloride: 104 mmol/L (ref 98–111)
Creatinine, Ser: 0.61 mg/dL (ref 0.44–1.00)
GFR, Estimated: >60 mL/min (ref >60)
Glucose, Bld: 102 mg/dL — ABNORMAL HIGH (ref 70–99)
Potassium: 3.8 mmol/L (ref 3.5–5.1)
Sodium: 138 mmol/L (ref 135–145)

## 2023-02-07 LAB — CBC
HCT: 43.2 % (ref 36.0–46.0)
Hemoglobin: 13.6 g/dL (ref 12.0–15.0)
MCH: 30 pg (ref 26.0–34.0)
MCHC: 31.5 g/dL (ref 30.0–36.0)
MCV: 95.2 fL (ref 80.0–100.0)
Platelets: 455 10*3/uL — ABNORMAL HIGH (ref 150–400)
RBC: 4.54 MIL/uL (ref 3.87–5.11)
RDW: 12.5 % (ref 11.5–15.5)
WBC: 13.7 10*3/uL — ABNORMAL HIGH (ref 4.0–10.5)
nRBC: 0 % (ref 0.0–0.2)

## 2023-02-07 LAB — TROPONIN I (HIGH SENSITIVITY)
Troponin I (High Sensitivity): 5 ng/L (ref <18)
Troponin I (High Sensitivity): 6 ng/L (ref <18)

## 2023-02-07 MED ORDER — ALUM & MAG HYDROXIDE-SIMETH 200-200-20 MG/5ML PO SUSP
30.0000 mL | Freq: Once | ORAL | Status: AC
  Administered 2023-02-07: 30 mL via ORAL
  Filled 2023-02-07: qty 30

## 2023-02-07 NOTE — ED Triage Notes (Signed)
Patient reports central chest pain that started in the middle of the night. Brought in by son. AAO x4. Denies n/v.

## 2023-02-07 NOTE — ED Provider Notes (Signed)
[place] EMERGENCY DEPARTMENT AT [facility] Provider Note   CSN: [id number] Arrival date & time: 02/07/23  6433     History  Chief Complaint  Patient presents with   Chest Pain    Tina Hardy is a 82 y.o. female.   Chest Pain Patient presents with chest pain.  Anterior chest.  Began last night.  States she feels little off.  Mid chest.  Not exertional.  No cough.  States she thinks he may have had heart issues before.  Thinks she may have a stent.  States it was done here.  Reviewing notes it appears that she may have had a carotid stent.  States she does have a heart murmur.    Past Medical History:  Diagnosis Date   Carotid arterial disease (HCC)    Colon polyp 07/18/2004   Hyperplastic   Diverticulosis    Hepatic hemangioma 07/16/2012   Hypertension    Liver hemangioma    Pernicious anemia    Rectal bleed    Past Surgical History:  Procedure Laterality Date   APPENDECTOMY  2012   Dr Abbey Chatters   APPENDECTOMY     CAROTID PTA/STENT INTERVENTION Left 04/22/2019   Procedure: CAROTID PTA/STENT INTERVENTION;  Surgeon: Renford Dills, MD;  Location: [facility] INVASIVE CV LAB;  Service: Cardiovascular;  Laterality: Left;   CHOLECYSTECTOMY  05/30/2012   Procedure: LAPAROSCOPIC CHOLECYSTECTOMY WITH INTRAOPERATIVE CHOLANGIOGRAM;  Surgeon: Atilano Ina, MD,FACS;  Location: [facility] OR;  Service: General;  Laterality: N/A;   DENTAL SURGERY     HERNIA REPAIR     INGUINAL HERNIA REPAIR  12/24/06   left; laparoscopic   KNEE SURGERY     right   MANDIBULAR HARDWARE REMOVAL N/A 07/04/2017   Procedure: REMOVAL OF IMPLANT MANDIBLE;  Surgeon: Vivia Ewing, DMD;  Location: [facility] OR;  Service: Oral Surgery;  Laterality: N/A;   UMBILICAL HERNIA REPAIR  12/24/06   with reduction of sigmoid colon which was incarcerated    Home Medications Prior to Admission medications   Medication Sig Start Date End Date Taking? Authorizing Provider  amLODipine (NORVASC) 5 MG tablet Take 1 tablet  (5 mg total) by mouth daily. 09/19/22   Aura Dials T, NP  aspirin EC 81 MG EC tablet Take 1 tablet (81 mg total) by mouth daily. 04/23/19   Stegmayer, Cala Bradford A, PA-C  busPIRone (BUSPAR) 5 MG tablet Take 1 tablet (5 mg total) by mouth 2 (two) times daily. 09/19/22   Cannady, Corrie Dandy T, NP  citalopram (CELEXA) 10 MG tablet Take 1 tablet (10 mg total) by mouth daily. 09/19/22   Cannady, Corrie Dandy T, NP  clopidogrel (PLAVIX) 75 MG tablet TAKE 1 TABLET BY MOUTH ONCE DAILY AT 6:00AM 09/19/22   Cannady, [name] T, NP  EPINEPHrine 0.3 mg/0.3 mL IJ SOAJ injection Inject 0.3 mg into the muscle as needed for anaphylaxis. 11/15/21   Cannady, Corrie Dandy T, NP  rosuvastatin (CRESTOR) 10 MG tablet Take 1 tablet (10 mg total) by mouth daily. 09/19/22   Cannady, Corrie Dandy T, NP  traZODone (DESYREL) 50 MG tablet Take 0.5-1 tablets (25-50 mg total) by mouth at bedtime as needed for sleep. 09/19/22   Aura Dials T, NP      Allergies    Bee venom, Codeine, and Penicillins    Review of Systems   Review of Systems  Cardiovascular:  Positive for chest pain.    Physical Exam Updated Vital Signs BP (!) 145/65 (BP Location: Right Arm)   Pulse 89   Temp  98.4 F (36.9 C)   Resp 16   SpO2 96%  Physical Exam Vitals and nursing note reviewed.  HENT:     Head: Atraumatic.  Cardiovascular:     Rate and Rhythm: Regular rhythm.     Heart sounds: Murmur heard.  Pulmonary:     Breath sounds: No wheezing.  Chest:     Chest wall: No tenderness.  Musculoskeletal:     Right lower leg: No tenderness. No edema.     Left lower leg: No tenderness. No edema.  Skin:    General: Skin is warm.  Neurological:     Mental Status: She is alert.     ED Results / Procedures / Treatments   Labs (all labs ordered are listed, but only abnormal results are displayed) Labs Reviewed  BASIC METABOLIC PANEL - Abnormal; Notable for the following components:      Result Value   Glucose, Bld 102 (*)    All other components within normal limits   CBC - Abnormal; Notable for the following components:   WBC 13.7 (*)    Platelets 455 (*)    All other components within normal limits  TROPONIN I (HIGH SENSITIVITY)  TROPONIN I (HIGH SENSITIVITY)    EKG EKG Interpretation Date/Time:  Thursday February 07 2023 09:10:21 EDT Ventricular Rate:  82 PR Interval:  141 QRS Duration:  139 QT Interval:  412 QTC Calculation: 482 R Axis:   -63  Text Interpretation: Sinus rhythm Left bundle branch block LBBB is new Confirmed by Benjiman Core [phone]) on 02/07/2023 11:19:04 AM  Radiology DG Chest 2 View  Result Date: 02/07/2023 CLINICAL DATA:  Chest pain. EXAM: CHEST - 2 VIEW COMPARISON:  01/29/2022. FINDINGS: Bilateral lung fields are clear. Right diaphragmatic eventrations again noted. Bilateral costophrenic angles are clear. Normal cardio-mediastinal silhouette. Aortic arch calcifications noted. No acute osseous abnormalities. The soft tissues are within normal limits. IMPRESSION: No active cardiopulmonary disease. Electronically Signed   By: Jules Schick M.D.   On: 02/07/2023 13:55    Procedures Procedures    Medications Ordered in ED Medications  alum & mag hydroxide-simeth (MAALOX/MYLANTA) 200-200-20 MG/5ML suspension 30 mL (30 mLs Oral Given 02/07/23 1302)    ED Course/ Medical Decision Making/ A&P                                 Medical Decision Making Amount and/or Complexity of Data Reviewed Labs: ordered. Radiology: ordered.  Risk OTC drugs.   Patient with chest pain.  Anterior chest.  Dull.  Began earlier today.  Not exertional.  No nausea or vomiting.  EKG shows a left bundle branch block which does appear to be somewhat new.  Initial troponin negative.  Chest x-ray reassuring but official read still pending.  Doubt pulmonary embolism, doubt aortic dissection.  Will get delta troponin.  Delta troponin negative.  Chest x-ray reassuring.  Feels better after GI cocktail.  Potentially GI cause.  Doubt cardiac  ischemia.  Will discharge home.        Final Clinical Impression(s) / ED Diagnoses Final diagnoses:  Nonspecific chest pain    Rx / DC Orders ED Discharge Orders     None         Benjiman Core, MD 02/07/23 1417

## 2023-02-11 ENCOUNTER — Telehealth: Payer: Self-pay | Admitting: Nurse Practitioner

## 2023-02-11 NOTE — Telephone Encounter (Unsigned)
Copied from CRM [id number]. Topic: General - Other >> Feb 11, 2023  2:01 PM Franchot Heidelberg wrote: Reason for CRM:  dental needs a list of the patient's current medicine list. Please fax Fax: [phone]  Attn: Morene Antu  Best contact: [phone] Needs medicine list + any diagnosis + allergies (especially med allergies)

## 2023-02-12 NOTE — Telephone Encounter (Signed)
Medication and allergy list faxed in as requested.

## 2023-02-20 NOTE — Telephone Encounter (Signed)
Pt stated she will be having surgery at Urgent Care in [place]; they will be doing surgery on her mouth, but they need a release form.  I asked pt if she had contact information for the location, as there are many urgent cares in [place]. She stated no. Contact my assistant, Mardene Speak, [phone] for more information.  Please advise.

## 2023-02-21 NOTE — Telephone Encounter (Signed)
Called and spoke with Tina Hardy she informed me that she would get the release form faxed to our office once she found the information.

## 2023-03-13 ENCOUNTER — Telehealth: Payer: Self-pay

## 2023-03-13 NOTE — Telephone Encounter (Signed)
Transition Care Management Follow-up Telephone Call Date of discharge and from where: Tina Hardy 9/19 How have you been since you were released from the hospital? PT stated she is fine as wine and doing well. Patient stated she has has no needs for a follow up at this time  Any questions or concerns? No  Items Reviewed: Did the pt receive and understand the discharge instructions provided? Yes  Medications obtained and verified? Yes  Other? No  Any new allergies since your discharge? No  Dietary orders reviewed? No Do you have support at home? No    Follow up appointments reviewed:  PCP Hospital f/u appt confirmed? No  Scheduled to see  on  @ . Specialist Hospital f/u appt confirmed? No  Scheduled to see  on  @ . Are transportation arrangements needed? No  If their condition worsens, is the pt aware to call PCP or go to the Emergency Dept.? No Was the patient provided with contact information for the PCP's office or ED? No Was to pt encouraged to call back with questions or concerns? No

## 2023-06-03 ENCOUNTER — Telehealth: Payer: Self-pay | Admitting: Nurse Practitioner

## 2023-06-03 MED ORDER — AMLODIPINE BESYLATE 5 MG PO TABS: 5.0000 mg | ORAL_TABLET | Freq: Every day | ORAL | 4 refills | Status: DC

## 2023-06-03 NOTE — Telephone Encounter (Signed)
 Patient called statin that she needs a refill on her medication. Amlodipine patients next appointment with provider is 06/06/2023 Please advise.

## 2023-06-06 ENCOUNTER — Ambulatory Visit: Payer: 59 | Admitting: Nurse Practitioner

## 2023-06-06 DIAGNOSIS — E538 Deficiency of other specified B group vitamins: Secondary | ICD-10-CM

## 2023-06-06 DIAGNOSIS — F5104 Psychophysiologic insomnia: Secondary | ICD-10-CM

## 2023-06-06 DIAGNOSIS — F418 Other specified anxiety disorders: Secondary | ICD-10-CM

## 2023-06-06 DIAGNOSIS — E782 Mixed hyperlipidemia: Secondary | ICD-10-CM

## 2023-06-06 DIAGNOSIS — I700 Atherosclerosis of aorta: Secondary | ICD-10-CM

## 2023-06-06 DIAGNOSIS — Z8673 Personal history of transient ischemic attack (TIA), and cerebral infarction without residual deficits: Secondary | ICD-10-CM

## 2023-06-06 DIAGNOSIS — I10 Essential (primary) hypertension: Secondary | ICD-10-CM

## 2023-06-06 DIAGNOSIS — R7309 Other abnormal glucose: Secondary | ICD-10-CM

## 2023-06-06 DIAGNOSIS — E559 Vitamin D deficiency, unspecified: Secondary | ICD-10-CM

## 2023-06-08 ENCOUNTER — Telehealth: Payer: 59 | Admitting: Nurse Practitioner

## 2023-06-08 DIAGNOSIS — B029 Zoster without complications: Secondary | ICD-10-CM

## 2023-06-08 DIAGNOSIS — L309 Dermatitis, unspecified: Secondary | ICD-10-CM | POA: Diagnosis not present

## 2023-06-08 MED ORDER — VALACYCLOVIR HCL 1 G PO TABS
1000.0000 mg | ORAL_TABLET | Freq: Three times a day (TID) | ORAL | 0 refills | Status: AC
Start: 2023-06-08 — End: 2023-06-15

## 2023-06-08 MED ORDER — PREDNISONE 20 MG PO TABS
20.0000 mg | ORAL_TABLET | Freq: Every day | ORAL | 0 refills | Status: AC
Start: 2023-06-08 — End: 2023-06-15

## 2023-06-08 MED ORDER — HYDROCORTISONE 2.5 % EX CREA
TOPICAL_CREAM | Freq: Two times a day (BID) | CUTANEOUS | 1 refills | Status: DC
Start: 2023-06-08 — End: 2024-03-18

## 2023-06-08 NOTE — Patient Instructions (Signed)
Tina Hardy, thank you for joining Claiborne Rigg, NP for today's virtual visit.  While this provider is not your primary care provider (PCP), if your PCP is located in our provider database this encounter information will be shared with them immediately following your visit.   A [facility] MyChart account gives you access to today's visit and all your visits, tests, and labs performed at [facility] " click here if you don't have a [facility] MyChart account or go to [email or web address]  Consent: (Patient) Tina Hardy provided verbal consent for this virtual visit at the beginning of the encounter.  Current Medications:  Current Outpatient Medications:    hydrocortisone 2.5 % cream, Apply topically 2 (two) times daily., Disp: 30 g, Rfl: 1   predniSONE (DELTASONE) 20 MG tablet, Take 1 tablet (20 mg total) by mouth daily with breakfast for 7 days., Disp: 7 tablet, Rfl: 0   valACYclovir (VALTREX) 1000 MG tablet, Take 1 tablet (1,000 mg total) by mouth 3 (three) times daily for 7 days., Disp: 21 tablet, Rfl: 0   amLODipine (NORVASC) 5 MG tablet, Take 1 tablet (5 mg total) by mouth daily., Disp: 90 tablet, Rfl: 4   aspirin EC 81 MG EC tablet, Take 1 tablet (81 mg total) by mouth daily., Disp: , Rfl:    busPIRone (BUSPAR) 5 MG tablet, Take 1 tablet (5 mg total) by mouth 2 (two) times daily., Disp: 180 tablet, Rfl: 4   citalopram (CELEXA) 10 MG tablet, Take 1 tablet (10 mg total) by mouth daily., Disp: 90 tablet, Rfl: 4   clopidogrel (PLAVIX) 75 MG tablet, TAKE 1 TABLET BY MOUTH ONCE DAILY AT 6:00AM, Disp: 90 tablet, Rfl: 4   EPINEPHrine 0.3 mg/0.3 mL IJ SOAJ injection, Inject 0.3 mg into the muscle as needed for anaphylaxis., Disp: 1 each, Rfl: 2   rosuvastatin (CRESTOR) 10 MG tablet, Take 1 tablet (10 mg total) by mouth daily., Disp: 90 tablet, Rfl: 4   traZODone (DESYREL) 50 MG tablet, Take 0.5-1 tablets (25-50 mg total) by mouth at bedtime as needed for sleep.,  Disp: 60 tablet, Rfl: 5   Medications ordered in this encounter:  Meds ordered this encounter  Medications   predniSONE (DELTASONE) 20 MG tablet    Sig: Take 1 tablet (20 mg total) by mouth daily with breakfast for 7 days.    Dispense:  7 tablet    Refill:  0    Supervising Provider:   Merrilee Jansky [[id number]]   hydrocortisone 2.5 % cream    Sig: Apply topically 2 (two) times daily.    Dispense:  30 g    Refill:  1    Supervising Provider:   Merrilee Jansky [[id number]]   valACYclovir (VALTREX) 1000 MG tablet    Sig: Take 1 tablet (1,000 mg total) by mouth 3 (three) times daily for 7 days.    Dispense:  21 tablet    Refill:  0    Supervising Provider:   Merrilee Jansky [[id number]]     *If you need refills on other medications prior to your next appointment, please contact your pharmacy*  Follow-Up: Call back or seek an in-person evaluation if the symptoms worsen or if the condition fails to improve as anticipated.  [facility] [phone]   If you have been instructed to have an in-person evaluation today at a local Urgent Care facility, please use the link below. It will take you to a list of all of our available  [facility], including address, phone number and hours of operation. Please do not delay care.  [facility]  If you or a family member do not have a primary care provider, use the link below to schedule a visit and establish care. When you choose a [facility] primary care physician or advanced practice provider, you gain a long-term partner in health. Find a Primary Care Provider  Learn more about [facility] in-office and virtual care options: [facility] - Get Care Now

## 2023-06-08 NOTE — Progress Notes (Signed)
Virtual Visit Consent   Tina Hardy, you are scheduled for a virtual visit with a [facility] provider today. Just as with appointments in the office, your consent must be obtained to participate. Your consent will be active for this visit and any virtual visit you may have with one of our providers in the next 365 days. If you have a MyChart account, a copy of this consent can be sent to you electronically.  As this is a virtual visit, video technology does not allow for your provider to perform a traditional examination. This may limit your provider's ability to fully assess your condition. If your provider identifies any concerns that need to be evaluated in person or the need to arrange testing (such as labs, EKG, etc.), we will make arrangements to do so. Although advances in technology are sophisticated, we cannot ensure that it will always work on either your end or our end. If the connection with a video visit is poor, the visit may have to be switched to a telephone visit. With either a video or telephone visit, we are not always able to ensure that we have a secure connection.  By engaging in this virtual visit, you consent to the provision of healthcare and authorize for your insurance to be billed (if applicable) for the services provided during this visit. Depending on your insurance coverage, you may receive a charge related to this service.  I need to obtain your verbal consent now. Are you willing to proceed with your visit today? Tina Hardy has provided verbal consent on 06/08/2023 for a virtual visit (video or telephone). Claiborne Rigg, NP  Date: 06/08/2023 1:19 PM  Virtual Visit via Video Note   I, Claiborne Rigg, connected with  Tina Hardy  ([id number], 1940/06/04) on 06/08/23 at  1:15 PM EST by a video-enabled telemedicine application and verified that I am speaking with the correct person using two identifiers.  Location: Patient: Virtual Visit Location  Patient: Home Provider: Virtual Visit Location Provider: Home Office   I discussed the limitations of evaluation and management by telemedicine and the availability of in person appointments. The patient expressed understanding and agreed to proceed.    History of Present Illness: Tina Hardy is a 83 y.o. who identifies as a female who was assigned female at birth, and is being seen today for rash.  Ms Topalian is experiencing burning and redness on her chest and under her neck. She believes it may be shingles as this feels like a previous shingles outbreak she had in the past. There are no visible lesions present today however there is significant erythema of the neck and chest. She is also rubbing the area of concern which could be contributing to the redness.    Problems:  Patient Active Problem List   Diagnosis Date Noted   Bee sting allergy 11/15/2021   Vitamin D deficiency 06/09/2021   Vitamin B12 deficiency 06/09/2021   DDD (degenerative disc disease), lumbar 06/18/2020   Aortic atherosclerosis (HCC) 06/18/2020   History of cerebellar stroke 06/18/2020   Elevated hemoglobin A1c 05/26/2019   Situational anxiety 04/24/2019   Stenosis of left carotid artery 03/23/2019   Insomnia 03/23/2019   Marijuana use 03/23/2019   Hypertension 03/22/2019   Hyperlipidemia 03/22/2019   Hepatic hemangioma 07/16/2012   Diverticulosis of colon 06/22/2008    Allergies:  Allergies  Allergen Reactions   Bee Venom Anaphylaxis   Codeine Other (See Comments)    Unknown reaction    Penicillins  Other (See Comments)    Unknown reaction  Has patient had a PCN reaction causing immediate rash, facial/tongue/throat swelling, SOB or lightheadedness with hypotension: n/a Has patient had a PCN reaction causing severe rash involving mucus membranes or skin necrosis: n/a Has patient had a PCN reaction that required hospitalization: n/a Has patient had a PCN reaction occurring within the last 10 years:  n/a If all of the above answers are "NO", then may proceed with Cephalosporin use.    Medications:  Current Outpatient Medications:    hydrocortisone 2.5 % cream, Apply topically 2 (two) times daily., Disp: 30 g, Rfl: 1   predniSONE (DELTASONE) 20 MG tablet, Take 1 tablet (20 mg total) by mouth daily with breakfast for 7 days., Disp: 7 tablet, Rfl: 0   valACYclovir (VALTREX) 1000 MG tablet, Take 1 tablet (1,000 mg total) by mouth 3 (three) times daily for 7 days., Disp: 21 tablet, Rfl: 0   amLODipine (NORVASC) 5 MG tablet, Take 1 tablet (5 mg total) by mouth daily., Disp: 90 tablet, Rfl: 4   aspirin EC 81 MG EC tablet, Take 1 tablet (81 mg total) by mouth daily., Disp: , Rfl:    busPIRone (BUSPAR) 5 MG tablet, Take 1 tablet (5 mg total) by mouth 2 (two) times daily., Disp: 180 tablet, Rfl: 4   citalopram (CELEXA) 10 MG tablet, Take 1 tablet (10 mg total) by mouth daily., Disp: 90 tablet, Rfl: 4   clopidogrel (PLAVIX) 75 MG tablet, TAKE 1 TABLET BY MOUTH ONCE DAILY AT 6:00AM, Disp: 90 tablet, Rfl: 4   EPINEPHrine 0.3 mg/0.3 mL IJ SOAJ injection, Inject 0.3 mg into the muscle as needed for anaphylaxis., Disp: 1 each, Rfl: 2   rosuvastatin (CRESTOR) 10 MG tablet, Take 1 tablet (10 mg total) by mouth daily., Disp: 90 tablet, Rfl: 4   traZODone (DESYREL) 50 MG tablet, Take 0.5-1 tablets (25-50 mg total) by mouth at bedtime as needed for sleep., Disp: 60 tablet, Rfl: 5  Observations/Objective: Patient is well-developed, well-nourished in no acute distress.  Resting comfortably at home.  Head is normocephalic, atraumatic.  No labored breathing.  Speech is clear and coherent with logical content.  Patient is alert and oriented at baseline.    Assessment and Plan: 1. Dermatitis (Primary) - predniSONE (DELTASONE) 20 MG tablet; Take 1 tablet (20 mg total) by mouth daily with breakfast for 7 days.  Dispense: 7 tablet; Refill: 0 - hydrocortisone 2.5 % cream; Apply topically 2 (two) times daily.   Dispense: 30 g; Refill: 1  2. Herpes zoster without complication - valACYclovir (VALTREX) 1000 MG tablet; Take 1 tablet (1,000 mg total) by mouth 3 (three) times daily for 7 days.  Dispense: 21 tablet; Refill: 0   Follow Up Instructions: I discussed the assessment and treatment plan with the patient. The patient was provided an opportunity to ask questions and all were answered. The patient agreed with the plan and demonstrated an understanding of the instructions.  A copy of instructions were sent to the patient via MyChart unless otherwise noted below.    The patient was advised to call back or seek an in-person evaluation if the symptoms worsen or if the condition fails to improve as anticipated.    Claiborne Rigg, NP

## 2023-06-17 ENCOUNTER — Other Ambulatory Visit: Payer: Self-pay | Admitting: Nurse Practitioner

## 2023-06-17 DIAGNOSIS — L309 Dermatitis, unspecified: Secondary | ICD-10-CM

## 2023-06-18 DIAGNOSIS — L2389 Allergic contact dermatitis due to other agents: Secondary | ICD-10-CM | POA: Diagnosis not present

## 2023-06-18 DIAGNOSIS — L309 Dermatitis, unspecified: Secondary | ICD-10-CM | POA: Diagnosis not present

## 2023-06-20 ENCOUNTER — Ambulatory Visit (INDEPENDENT_AMBULATORY_CARE_PROVIDER_SITE_OTHER): Payer: 59 | Admitting: Pediatrics

## 2023-06-20 VITALS — BP 128/70 | HR 97 | Wt 149.2 lb

## 2023-06-20 DIAGNOSIS — Z1330 Encounter for screening examination for mental health and behavioral disorders, unspecified: Secondary | ICD-10-CM

## 2023-06-20 DIAGNOSIS — R21 Rash and other nonspecific skin eruption: Secondary | ICD-10-CM

## 2023-06-20 MED ORDER — SILVER SULFADIAZINE 1 % EX CREA
1.0000 | TOPICAL_CREAM | Freq: Every day | CUTANEOUS | 0 refills | Status: DC
Start: 2023-06-20 — End: 2024-03-18

## 2023-06-20 NOTE — Progress Notes (Unsigned)
   Office Visit  BP 128/70 (BP Location: Right Arm, Patient Position: Sitting, Cuff Size: Small)   Pulse 97   Wt 149 lb 3.2 oz (67.7 kg)   SpO2 96%   BMI 26.02 kg/m    Subjective:    Patient ID: Tina Hardy, female    DOB: 07-19-1940, 83 y.o.   MRN: [id number]  HPI: Tina Hardy is a 83 y.o. female  Chief Complaint  Patient presents with   Rash    Seen derm. The day before yesterday. They didn't know. Gave a shot and she have cream    Discussed the use of AI scribe software for clinical note transcription with the patient, who gave verbal consent to proceed.  History of Present Illness            Relevant past medical, surgical, family and social history reviewed and updated as indicated. Interim medical history since our last visit reviewed. Allergies and medications reviewed and updated.  ROS per HPI unless specifically indicated above     Objective:    BP 128/70 (BP Location: Right Arm, Patient Position: Sitting, Cuff Size: Small)   Pulse 97   Wt 149 lb 3.2 oz (67.7 kg)   SpO2 96%   BMI 26.02 kg/m   Wt Readings from Last 3 Encounters:  06/20/23 149 lb 3.2 oz (67.7 kg)  02/04/23 152 lb (68.9 kg)  12/25/22 152 lb 6.4 oz (69.1 kg)     Physical Exam      06/20/2023   10:49 AM 02/04/2023    2:39 PM 12/25/2022    2:32 PM 12/04/2022    3:22 PM 05/18/2022    1:11 PM  Depression screen PHQ 2/9  Decreased Interest 2 0 3 0 0  Down, Depressed, Hopeless 0 0 0 0 0  PHQ - 2 Score 2 0 3 0 0  Altered sleeping 3 3 3 2 2   Tired, decreased energy 1 1 0 0 0  Change in appetite 2 2 0 0 0  Feeling bad or failure about yourself  0 0 0 0 0  Trouble concentrating 0 3 0 0 0  Moving slowly or fidgety/restless  1 0 0 0  Suicidal thoughts 0 0 0 0 0  PHQ-9 Score 8 10 6 2 2   Difficult doing work/chores Not difficult at all Somewhat difficult Not difficult at all Not difficult at all Not difficult at all       06/20/2023   10:49 AM 02/04/2023    2:39 PM 12/25/2022     2:32 PM 12/04/2022    3:23 PM  GAD 7 : Generalized Anxiety Score  Nervous, Anxious, on Edge 1 1 0 0  Control/stop worrying 0 1 0 0  Worry too much - different things 0 0 0 0  Trouble relaxing 0 0 0 0  Restless 0 1 0 0  Easily annoyed or irritable 0 0 0 0  Afraid - awful might happen 0 0 0 0  Total GAD 7 Score 1 3 0 0  Anxiety Difficulty Not difficult at all Somewhat difficult Not difficult at all Not difficult at all       Assessment & Plan:  Assessment & Plan   Encounter for behavioral health screening     Assessment and Plan              Follow up plan: No follow-ups on file.  Quentyn Kolbeck Howell Pringle, MD

## 2023-06-20 NOTE — Patient Instructions (Addendum)
Continue the recommendations from dermatology Consider allergist referral in the future  Anti itch over the counter- serna (another thing ot try in the future)  I sent silvadene cream which will help heal it too. Can use twice daily in between the other creams.

## 2023-06-26 ENCOUNTER — Encounter: Payer: Self-pay | Admitting: Pediatrics

## 2023-07-07 ENCOUNTER — Other Ambulatory Visit: Payer: Self-pay

## 2023-07-07 DIAGNOSIS — R040 Epistaxis: Secondary | ICD-10-CM | POA: Diagnosis not present

## 2023-07-07 DIAGNOSIS — R58 Hemorrhage, not elsewhere classified: Secondary | ICD-10-CM | POA: Diagnosis not present

## 2023-07-07 MED ORDER — OXYMETAZOLINE HCL 0.05 % NA SOLN
1.0000 | Freq: Once | NASAL | Status: AC
  Administered 2023-07-07: 1 via NASAL
  Filled 2023-07-07: qty 30

## 2023-07-07 NOTE — ED Triage Notes (Signed)
 Pt to ed from home via [facility] for a nose bleed that has been intermittent all day. Pt is caox4, in no acute distress. Per EMS it will stop bleeding, and then she messes with it and it starts bleeding again.   155/89 82 HR 96% RA

## 2023-07-08 ENCOUNTER — Emergency Department
Admission: EM | Admit: 2023-07-08 | Discharge: 2023-07-08 | Disposition: A | Discharge status: Home / Self Care | Payer: 59 | Attending: Student in an Organized Health Care Education/Training Program | Admitting: Student in an Organized Health Care Education/Training Program

## 2023-07-08 DIAGNOSIS — R040 Epistaxis: Secondary | ICD-10-CM

## 2023-07-08 NOTE — ED Provider Notes (Signed)
 [facility] Provider Note    Event Date/Time   First MD Initiated Contact with Patient 07/08/23 [phone]     (approximate)   History   Epistaxis   HPI  Tina Hardy is a 83 y.o. female on Plavix presents to the ER for evaluation of intermittent epistaxis.  Symptoms occurring over the past 24 hours.  States that she was plucking hairs from her nose yesterday.  EMS came out to visit patient bleeding was able to be stopped but reported the patient continued to mess with it rather than holding pressure.  She arrives to the ER in no acute distress.  Denies any other symptoms or concerns.     Physical Exam   Triage Vital Signs: ED Triage Vitals  Encounter Vitals Group     BP 07/07/23 2308 137/87     Systolic BP Percentile --      Diastolic BP Percentile --      Pulse Rate 07/07/23 2308 79     Resp 07/07/23 2308 16     Temp 07/07/23 2308 98 F (36.7 C)     Temp Source 07/07/23 2308 Oral     SpO2 07/07/23 2308 98 %     Weight 07/07/23 2306 145 lb (65.8 kg)     Height 07/07/23 2306 5\' 5"  (1.651 m)     Head Circumference --      Peak Flow --      Pain Score 07/07/23 2305 0     Pain Loc --      Pain Education --      Exclude from Growth Chart --     Most recent vital signs: Vitals:   07/07/23 2308 07/08/23 0232  BP: 137/87 (!) 143/71  Pulse: 79 67  Resp: 16 18  Temp: 98 F (36.7 C) (!) 97.5 F (36.4 C)  SpO2: 98% 99%     Constitutional: Alert  Eyes: Conjunctivae are normal.  Head: Atraumatic. Nose: No congestion/rhinnorhea.  Small area of erythema and recent bleed the right anterior nare on the [place] wing.  No AVM.  No mass.  No polyp.  No evidence of posterior bleed. Mouth/Throat: Mucous membranes are moist.   Neck: Painless ROM.  Cardiovascular:   Good peripheral circulation. Respiratory: Normal respiratory effort.  No retractions.   Neurologic:  MAE spontaneously. No gross focal neurologic deficits are appreciated.  Skin:  Skin is  warm, dry and intact. No rash noted. Psychiatric: Mood and affect are normal. Speech and behavior are normal.    ED Results / Procedures / Treatments   Labs (all labs ordered are listed, but only abnormal results are displayed) Labs Reviewed - No data to display   EKG     RADIOLOGY    PROCEDURES:  Critical Care performed:   Procedures   MEDICATIONS ORDERED IN ED: Medications  oxymetazoline (AFRIN) 0.05 % nasal spray 1 spray (1 spray Each Nare Given 07/07/23 2314)     IMPRESSION / MDM / ASSESSMENT AND PLAN / ED COURSE  I reviewed the triage vital signs and the nursing notes.                              Differential diagnosis includes, but is not limited to, anterior epistaxis, laceration, trauma, AVM  Patient presented the ER for evaluation epistaxis as described above.  Hemostatic at time.  We discussed options including conservative management and observation, cautery, nasal packing.  Patient agreeable  with conservative management and observation.  Discussed signs and symptoms for which she should return to the ER.       FINAL CLINICAL IMPRESSION(S) / ED DIAGNOSES   Final diagnoses:  Anterior epistaxis     Rx / DC Orders   ED Discharge Orders     None        Note:  This document was prepared using Dragon voice recognition software and may include unintentional dictation errors.    Willy Eddy, MD 07/08/23 [phone]

## 2023-07-08 NOTE — Discharge Instructions (Addendum)
 Recommend to use a Q-tip to place Vaseline petroleum jelly just inside the nose to keep the area moist and allow it to heal.  Do not insert anything else into the nares.  Do not try to pluck any hairs or trim them.  Return to the ER if you have any additional questions or concerns.  Return for any additional bleeding that you are unable to stop with direct pressure.

## 2023-12-09 ENCOUNTER — Other Ambulatory Visit: Payer: Self-pay | Admitting: Nurse Practitioner

## 2023-12-11 NOTE — Telephone Encounter (Signed)
 Requested medication (s) are due for refill today - yes  Requested medication (s) are on the active medication list -yes  Future visit scheduled -yes  Last refill: 09/19/23  Notes to clinic: OV 06/20/23, Call to patient - scheduled annual visit 02/05/24 (first available with provider) can she get refill until that visit?   Requested Prescriptions  Pending Prescriptions Disp Refills   rosuvastatin  (CRESTOR ) 10 MG tablet [Pharmacy Med Name: ROSUVASTATIN  CALCIUM  10 MG TAB] 90 tablet 4    Sig: TAKE 1 TABLET BY MOUTH ONCE EVERY EVENING     Cardiovascular:  Antilipid - Statins 2 Failed - 12/11/2023  2:44 PM      Failed - Valid encounter within last 12 months    Recent Outpatient Visits           6 years ago Rash and nonspecific skin eruption   [facility] [place], [place], [place]              Failed - Lipid Panel in normal range within the last 12 months    Cholesterol, Total  Date Value Ref Range Status  12/04/2022 151 100 - 199 mg/dL Final   LDL Chol Calc (NIH)  Date Value Ref Range Status  12/04/2022 68 0 - 99 mg/dL Final   HDL  Date Value Ref Range Status  12/04/2022 47 >39 mg/dL Final   Triglycerides  Date Value Ref Range Status  12/04/2022 224 (H) 0 - 149 mg/dL Final         Passed - Cr in normal range and within 360 days    Creatinine, Ser  Date Value Ref Range Status  02/07/2023 0.61 0.44 - 1.00 mg/dL Final         Passed - Patient is not pregnant       busPIRone  (BUSPAR ) 5 MG tablet [Pharmacy Med Name: BUSPIRONE  HCL 5 MG TAB] 180 tablet 4    Sig: TAKE 1 TABLET BY MOUTH TWICE DAILY     Psychiatry: Anxiolytics/Hypnotics - Non-controlled Failed - 12/11/2023  2:44 PM      Failed - Valid encounter within last 12 months    Recent Outpatient Visits           6 years ago Rash and nonspecific skin eruption   [facility] [place], [place], [place]               traZODone  (DESYREL ) 50 MG tablet [Pharmacy Med Name: TRAZODONE  HCL 50 MG  TAB] 60 tablet 5    Sig: TAKE 1/2-1 TABLET BY MOUTH AT BEDTIME ASNEEDED FOR SLEEP     Psychiatry: Antidepressants - Serotonin Modulator Failed - 12/11/2023  2:44 PM      Failed - Valid encounter within last 6 months    Recent Outpatient Visits           6 years ago Rash and nonspecific skin eruption   [facility] at [facility], Emil Schanz, MD                 Requested Prescriptions  Pending Prescriptions Disp Refills   rosuvastatin  (CRESTOR ) 10 MG tablet [Pharmacy Med Name: ROSUVASTATIN  CALCIUM  10 MG TAB] 90 tablet 4    Sig: TAKE 1 TABLET BY MOUTH ONCE EVERY EVENING     Cardiovascular:  Antilipid - Statins 2 Failed - 12/11/2023  2:44 PM      Failed - Valid encounter within last 12 months    Recent Outpatient Visits  6 years ago Rash and nonspecific skin eruption   [facility] at [facility] [place], [place]              Failed - Lipid Panel in normal range within the last 12 months    Cholesterol, Total  Date Value Ref Range Status  12/04/2022 151 100 - 199 mg/dL Final   LDL Chol Calc (NIH)  Date Value Ref Range Status  12/04/2022 68 0 - 99 mg/dL Final   HDL  Date Value Ref Range Status  12/04/2022 47 >39 mg/dL Final   Triglycerides  Date Value Ref Range Status  12/04/2022 224 (H) 0 - 149 mg/dL Final         Passed - Cr in normal range and within 360 days    Creatinine, Ser  Date Value Ref Range Status  02/07/2023 0.61 0.44 - 1.00 mg/dL Final         Passed - Patient is not pregnant       busPIRone  (BUSPAR ) 5 MG tablet [Pharmacy Med Name: BUSPIRONE  HCL 5 MG TAB] 180 tablet 4    Sig: TAKE 1 TABLET BY MOUTH TWICE DAILY     Psychiatry: Anxiolytics/Hypnotics - Non-controlled Failed - 12/11/2023  2:44 PM      Failed - Valid encounter within last 12 months    Recent Outpatient Visits           6 years ago Rash and nonspecific skin eruption   [facility] at [place], [employer], MD               traZODone   (DESYREL ) 50 MG tablet [Pharmacy Med Name: TRAZODONE  HCL 50 MG TAB] 60 tablet 5    Sig: TAKE 1/2-1 TABLET BY MOUTH AT BEDTIME ASNEEDED FOR SLEEP     Psychiatry: Antidepressants - Serotonin Modulator Failed - 12/11/2023  2:44 PM      Failed - Valid encounter within last 6 months    Recent Outpatient Visits           6 years ago Rash and nonspecific skin eruption   [facility] at [facility], Emil Schanz, MD

## 2023-12-19 ENCOUNTER — Ambulatory Visit (INDEPENDENT_AMBULATORY_CARE_PROVIDER_SITE_OTHER): Admitting: Emergency Medicine

## 2023-12-19 VITALS — Ht 65.5 in | Wt 145.0 lb

## 2023-12-19 DIAGNOSIS — Z599 Problem related to housing and economic circumstances, unspecified: Secondary | ICD-10-CM

## 2023-12-19 DIAGNOSIS — Z0000 Encounter for general adult medical examination without abnormal findings: Secondary | ICD-10-CM

## 2023-12-19 DIAGNOSIS — Z5982 Transportation insecurity: Secondary | ICD-10-CM

## 2023-12-19 DIAGNOSIS — Z5941 Food insecurity: Secondary | ICD-10-CM
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
## 2023-12-19 NOTE — Patient Instructions (Addendum)
 [name] , Thank you for taking time out of your busy schedule to complete your Annual Wellness Visit with me. I enjoyed our conversation and look forward to speaking with you again next year. I, as well as your care team,  appreciate your ongoing commitment to your health goals. Please review the following plan we discussed and let me know if I can assist you in the future. Your Game plan/ To Do List    Referrals: If you haven't heard from the office you've been referred to, please reach out to them at the phone provided.   A referral has been placed for you to check and see what additional resources are available to you.   If you haven't heard from anyone within the next 7 business days, please call them and let them know a referral has been placed for you Phone: [phone]  Follow up Visits: Next Medicare AWV with our clinical staff: 12/31/24 @ 8:40am (phone visit)   Have you seen your provider in the last 6 months (3 months if uncontrolled diabetes)? No Next Office Visit with your provider: 02/05/24 @ 1:20pm with Tina Cannady, NP  Clinician Recommendations: Recommend getting a routine eye exam every 2 years. I have included a list of eye doctors in the area.  Aim for 30 minutes of exercise or brisk walking, 6-8 glasses of water, and 5 servings of fruits and vegetables each day.       This is a list of the screening recommended for you and due dates:  Health Maintenance  Topic Date Due   DEXA scan (bone density measurement)  Never done   Flu Shot  12/20/2023   Medicare Annual Wellness Visit  12/18/2024   Hepatitis B Vaccine  Aged Out   HPV Vaccine  Aged Out   Meningitis B Vaccine  Aged Out   DTaP/Tdap/Td vaccine  Discontinued   Pneumococcal Vaccine for age over 92  Discontinued   COVID-19 Vaccine  Discontinued   Hepatitis C Screening  Discontinued   Zoster (Shingles) Vaccine  Discontinued    Advanced directives: (Provided) Advance directive discussed with you today. I  have provided a copy for you to complete at home and have notarized. Once this is complete, please bring a copy in to our office so we can scan it into your chart.  Advance Care Planning is important because it:  [x]  Makes sure you receive the medical care that is consistent with your values, goals, and preferences  [x]  It provides guidance to your family and loved ones and reduces their decisional burden about whether or not they are making the right decisions based on your wishes.  Follow the link provided in your after visit summary or read over the paperwork we have mailed to you to help you started getting your Advance Directives in place. If you need assistance in completing these, please reach out to us  so that we can help you!  See attachments for Preventive Care and Fall Prevention Tips.   There are several Eye Doctors in your area. Here are a few that usually accept all insurance types:  [facility]  [address] [place], [place] [address] Phone: [phone]  [facility] [address] [place], [place] [address] Phone: [phone]  [facility] [address] [place], [place] [address] Phone: [phone]  [facility] [address] [place], [place] [address] Phone: [phone]  [facility] [address] [place], [place] [address] Phone: [phone]  [facility]  [facility] [address] [place], [place] [address] Phone: [phone]  [facility] [address] [place], [place] [address] Phone: [phone]  Please let us  know if you require a referral for an eye exam appointment. Thank you!     Fall Prevention in the Home, Adult Falls can cause injuries and affect people of all ages. There are many simple things that you can do to make your home safe and to help prevent falls. If you need it, ask for help making these changes. What actions can I take to prevent falls? General information Use good lighting in all rooms. Make sure to: Replace any  light bulbs that burn out. Turn on lights if it is dark and use night-lights. Keep items that you use often in easy-to-reach places. Lower the shelves around your home if needed. Move furniture so that there are clear paths around it. Do not keep throw rugs or other things on the floor that can make you trip. If any of your floors are uneven, fix them. Add color or contrast paint or tape to clearly mark and help you see: Grab bars or handrails. First and last steps of staircases. Where the edge of each step is. If you use a ladder or stepladder: Make sure that it is fully opened. Do not climb a closed ladder. Make sure the sides of the ladder are locked in place. Have someone hold the ladder while you use it. Know where your pets are as you move through your home. What can I do in the bathroom?     Keep the floor dry. Clean up any water that is on the floor right away. Remove soap buildup in the bathtub or shower. Buildup makes bathtubs and showers slippery. Use non-skid mats or decals on the floor of the bathtub or shower. Attach bath mats securely with double-sided, non-slip rug tape. If you need to sit down while you are in the shower, use a non-slip stool. Install grab bars by the toilet and in the bathtub and shower. Do not use towel bars as grab bars. What can I do in the bedroom? Make sure that you have a light by your bed that is easy to reach. Do not use any sheets or blankets on your bed that hang to the floor. Have a firm bench or chair with side arms that you can use for support when you get dressed. What can I do in the kitchen? Clean up any spills right away. If you need to reach something above you, use a sturdy step stool that has a grab bar. Keep electrical cables out of the way. Do not use floor polish or wax that makes floors slippery. What can I do with my stairs? Do not leave anything on the stairs. Make sure that you have a light switch at the top and the  bottom of the stairs. Have them installed if you do not have them. Make sure that there are handrails on both sides of the stairs. Fix handrails that are broken or loose. Make sure that handrails are as long as the staircases. Install non-slip stair treads on all stairs in your home if they do not have carpet. Avoid having throw rugs at the top or bottom of stairs, or secure the rugs with carpet tape to prevent them from moving. Choose a carpet design that does not hide the edge of steps on the stairs. Make sure that carpet is firmly attached to the stairs.  Fix any carpet that is loose or worn. What can I do on the outside of my home? Use bright outdoor lighting. Repair the edges of walkways and driveways and fix any cracks. Clear paths of anything that can make you trip, such as tools or rocks. Add color or contrast paint or tape to clearly mark and help you see high doorway thresholds. Trim any bushes or trees on the main path into your home. Check that handrails are securely fastened and in good repair. Both sides of all steps should have handrails. Install guardrails along the edges of any raised decks or porches. Have leaves, snow, and ice cleared regularly. Use sand, salt, or ice melt on walkways during winter months if you live where there is ice and snow. In the garage, clean up any spills right away, including grease or oil spills. What other actions can I take? Review your medicines with your health care provider. Some medicines can make you confused or feel dizzy. This can increase your chance of falling. Wear closed-toe shoes that fit well and support your feet. Wear shoes that have rubber soles and low heels. Use a cane, walker, scooter, or crutches that help you move around if needed. Talk with your provider about other ways that you can decrease your risk of falls. This may include seeing a physical therapist to learn to do exercises to improve movement and strength. Where to find  more information Centers for Disease Control and Prevention, STEADI: TonerPromos.no [employer] on Aging: BaseRingTones.pl National Institute on Aging: BaseRingTones.pl Contact a health care provider if: You are afraid of falling at home. You feel weak, drowsy, or dizzy at home. You fall at home. Get help right away if you: Lose consciousness or have trouble moving after a fall. Have a fall that causes a head injury. These symptoms may be an emergency. Get help right away. Call 911. Do not wait to see if the symptoms will go away. Do not drive yourself to the hospital. This information is not intended to replace advice given to you by your health care provider. Make sure you discuss any questions you have with your health care provider. Document Revised: 01/08/2022 Document Reviewed: 01/08/2022 Elsevier Patient Education  2024 [employer].

## 2023-12-20 ENCOUNTER — Telehealth: Payer: Self-pay

## 2023-12-20 NOTE — Progress Notes (Signed)
 Complex Care Management Note  Care Guide Note 12/20/2023 Name: Tina Hardy MRN: [id number] DOB: 01-22-1941  Tina Hardy is a 83 y.o. year old female who sees [place], [name] T, NP for primary care. I reached out to [facility] by phone today to offer complex care management services.  [name] was given information about Complex Care Management services today including:   The Complex Care Management services include support from the care team which includes your Nurse Care Manager, Clinical Social Worker, or Pharmacist.  The Complex Care Management team is here to help remove barriers to the health concerns and goals most important to you. Complex Care Management services are voluntary, and the patient may decline or stop services at any time by request to their care team member.   Complex Care Management Consent Status: Patient agreed to services and verbal consent obtained.   Follow up plan:  Telephone appointment with complex care management team member scheduled for:  [name] 12/23/2023 [name] 01/10/2024  Encounter Outcome:  Patient Scheduled  Jeoffrey Buffalo , RMA     [place]  [facility] Guide  Direct Dial: [phone]  Website: [email or web address]

## 2023-12-23 ENCOUNTER — Other Ambulatory Visit: Payer: Self-pay

## 2023-12-23 NOTE — Patient Instructions (Signed)
 Visit Information  Thank you for taking time to visit with me today. Please don't hesitate to contact me if I can be of assistance to you before our next scheduled appointment.  Our next appointment is by telephone on 01/10/24 at 11am Please call the care guide team at [phone] if you need to cancel or reschedule your appointment.   Following is a copy of your care plan:   Goals Addressed             This Visit's Progress    [name] VBCI Social Work Care Plan       Problems:   Corporate treasurer  and Transportation  CSW Clinical Goal(s):   Over the next 30 days the Patient will work with Child psychotherapist to address concerns related to transportation and finances.  Interventions:  SW mailed patient resources for utilities and finances.  Patient Goals/Self-Care Activities:  Patient will contact resources for assistance.  Plan:   SW will follow up on 01/13/24 11am         Please call the Suicide and Crisis Lifeline: 988 call the [place]  National Suicide Prevention Lifeline: (979) 640-7384 or TTY: (218)613-4353 TTY (540) 810-7189) to talk to a trained counselor call 1-800-273-TALK (toll free, 24 hour hotline) call 911 if you are experiencing a Mental Health or Behavioral Health Crisis or need someone to talk to.  Patient verbalizes understanding of instructions and care plan provided today and agrees to view in MyChart. Active MyChart status and patient understanding of how to access instructions and care plan via MyChart confirmed with patient.     Thersia Hoar, HEDWIG, MHA [place]  [facility] Social Worker, Population Health [phone]

## 2023-12-23 NOTE — Patient Outreach (Signed)
 Complex Care Management   Visit Note  12/23/2023  Name:  Tina Hardy MRN: [id number] DOB: 26-Mar-1941  Situation: Referral received for Complex Care Management related to SDOH Barriers:  Transportation Lack of essential utilities all Financial Resource Strain I obtained verbal consent from Patient.  Visit completed with patient  on the phone  Background:   Past Medical History:  Diagnosis Date   Carotid arterial disease (HCC)    Colon polyp 07/18/2004   Hyperplastic   Diverticulosis    Hepatic hemangioma 07/16/2012   Hypertension    Liver hemangioma    Pernicious anemia    Rectal bleed     Assessment: SW completed a telephone outreach with patient, she states she wants to go to the gym membership to work out. Patient states she receives social security and all of it goes towards her utilities. Patient states she does not have transportation. She has a friend that comes over and helps her out and takes her to run errands. Patient does receive foodstamps but states she needs to reset her pein number. SW and patient agreed for resources for utilities and transportation to be mailed to address on file  SDOH Interventions    Flowsheet Row Patient Outreach Telephone from 12/23/2023 in [place] POPULATION HEALTH DEPARTMENT Clinical Support from 12/19/2023 in [facility] Office Visit from 06/20/2023 in [facility] Office Visit from 02/04/2023 in [facility] Clinical Support from 11/06/2021 in [facility] Chronic Care Management from 12/27/2020 in [facility]  SDOH Interventions        Food Insecurity Interventions Community Resources Provided Other (Comment)  [VBCI referral] -- -- Intervention Not Indicated --  Housing Interventions -- Intervention Not Indicated -- -- Intervention Not Indicated --  Transportation Interventions -- Intervention Not Indicated -- -- -- --   Utilities Interventions Community Resources Provided Intervention Not Indicated -- -- -- --  Alcohol Usage Interventions -- Intervention Not Indicated (Score <7) -- -- -- --  Depression Interventions/Treatment  -- Currently on Treatment, PHQ2-9 Score <4 Follow-up Not Indicated PHQ2-9 Score <4 Follow-up Not Indicated Medication, Currently on Treatment -- --  Financial Strain Interventions Community Resources Provided Other (Comment)  [VBCI referral] -- -- Intervention Not Indicated --  Physical Activity Interventions -- Patient Declined -- -- Intervention Not Indicated Other (Comments)  [the patient does not do structured activity, stays in a lot]  Stress Interventions -- Intervention Not Indicated -- -- Intervention Not Indicated --  Social Connections Interventions -- Patient Declined -- -- Intervention Not Indicated --  Health Literacy Interventions -- Intervention Not Indicated -- -- -- --    Recommendation:   No recommendations at this time.  Follow Up Plan:   Telephone follow-up on 01/10/24 at 11am  Thersia Hoar, [name], [facility] [place]  [facility] Social Worker, Population Health [phone]

## 2024-01-01 ENCOUNTER — Telehealth: Payer: Self-pay | Admitting: Nurse Practitioner

## 2024-01-01 NOTE — Telephone Encounter (Signed)
 Copied from CRM [id number]. Topic: General - Other >> Dec 31, 2023  4:39 PM Carlyon D wrote: Reason for CRM: Schuyler calling from [employer] in regards to transferring pt scripts over to pharmacy. She stated she sent the 1st fax request 12/29/23. She just re sent the fax over now 12/31/23.

## 2024-01-01 NOTE — Telephone Encounter (Signed)
 Fax received from [employer]. Will complete, have provider sign, and fax back to them.

## 2024-01-07 ENCOUNTER — Telehealth: Payer: Self-pay | Admitting: Nurse Practitioner

## 2024-01-07 NOTE — Telephone Encounter (Signed)
Paperwork completed and awaiting providers signature.

## 2024-01-07 NOTE — Telephone Encounter (Signed)
 Copied from CRM [id number]. Topic: General - Other >> Dec 31, 2023  4:39 PM Carlyon D wrote: Reason for CRM: Schuyler calling from [employer] in regards to transferring pt scripts over to pharmacy. She stated she sent the 1st fax request 12/29/23. She just re sent the fax over now 12/31/23. >> Jan 06, 2024  4:56 PM Drema MATSU wrote: Schuyler with [facility] is calling to follow up on transfer of script. She wants to know when they should receive the scripts.

## 2024-01-07 NOTE — Telephone Encounter (Signed)
 Paperwork has been received. Will work on it and fax back as soon as possible.

## 2024-01-09 NOTE — Telephone Encounter (Signed)
 Paperwork signed and faxed back to [employer].

## 2024-01-10 ENCOUNTER — Other Ambulatory Visit: Payer: Self-pay

## 2024-01-10 ENCOUNTER — Telehealth: Payer: Self-pay

## 2024-01-24 ENCOUNTER — Telehealth: Payer: Self-pay

## 2024-02-02 NOTE — Patient Instructions (Incomplete)
 Please call to schedule your mammogram and/or bone density: [facility] at [facility]  Address: [address], [place], [place] [address] Phone: [phone]  [facility] [address] [place],  [place]  [address] Phone: [phone]    Healthy Eating, Adult Healthy eating may help you get and keep a healthy body weight, reduce the risk of chronic disease, and live a long and productive life. It is important to follow a healthy eating pattern. Your nutritional and calorie needs should be met mainly by different nutrient-rich foods. What are tips for following this plan? Reading food labels Read labels and choose the following: Reduced or low sodium products. Juices with 100% fruit juice. Foods with low saturated fats (<3 g per serving) and high polyunsaturated and monounsaturated fats. Foods with whole grains, such as whole wheat, cracked wheat, brown rice, and wild rice. Whole grains that are fortified with folic acid. This is recommended for females who are pregnant or who want to become pregnant. Read labels and do not eat or drink the following: Foods or drinks with added sugars. These include foods that contain brown sugar, corn sweetener, corn syrup, dextrose, fructose, glucose, high-fructose corn syrup, honey, invert sugar, lactose, malt syrup, maltose, molasses, raw sugar, sucrose, trehalose, or turbinado sugar. Limit your intake of added sugars to less than 10% of your total daily calories. Do not eat more than the following amounts of added sugar per day: 6 teaspoons (25 g) for females. 9 teaspoons (38 g) for males. Foods that contain processed or refined starches and grains. Refined grain products, such as white flour, degermed cornmeal, white bread, and white rice. Shopping Choose nutrient-rich snacks, such as vegetables, whole fruits, and nuts. Avoid high-calorie and high-sugar snacks, such as potato chips,  fruit snacks, and candy. Use oil-based dressings and spreads on foods instead of solid fats such as butter, margarine, sour cream, or cream cheese. Limit pre-made sauces, mixes, and instant products such as flavored rice, instant noodles, and ready-made pasta. Try more plant-protein sources, such as tofu, tempeh, black beans, edamame, lentils, nuts, and seeds. Explore eating plans such as the Mediterranean diet or vegetarian diet. Try heart-healthy dips made with beans and healthy fats like hummus and guacamole. Vegetables go great with these. Cooking Use oil to saut or stir-fry foods instead of solid fats such as butter, margarine, or lard. Try baking, boiling, grilling, or broiling instead of frying. Remove the fatty part of meats before cooking. Steam vegetables in water or broth. Meal planning  At meals, imagine dividing your plate into fourths: One-half of your plate is fruits and vegetables. One-fourth of your plate is whole grains. One-fourth of your plate is protein, especially lean meats, poultry, eggs, tofu, beans, or nuts. Include low-fat dairy as part of your daily diet. Lifestyle Choose healthy options in all settings, including home, work, school, restaurants, or stores. Prepare your food safely: Wash your hands after handling raw meats. Where you prepare food, keep surfaces clean by regularly washing with hot, soapy water. Keep raw meats separate from ready-to-eat foods, such as fruits and vegetables. Cook seafood, meat, poultry, and eggs to the recommended temperature. Get a food thermometer. Store foods at safe temperatures. In general: Keep cold foods at 43F (4.4C) or below. Keep hot foods at 143F (60C) or above. Keep your freezer at [facility] (-17.8C) or below. Foods are not safe to eat if they have been between the temperatures of 40-143F (4.4-60C) for more  than 2 hours. What foods should I eat? Fruits Aim to eat 1-2 cups of fresh, canned (in natural juice),  or frozen fruits each day. One cup of fruit equals 1 small apple, 1 large banana, 8 large strawberries, 1 cup (237 g) canned fruit,  cup (82 g) dried fruit, or 1 cup (240 mL) 100% juice. Vegetables Aim to eat 2-4 cups of fresh and frozen vegetables each day, including different varieties and colors. One cup of vegetables equals 1 cup (91 g) broccoli or cauliflower florets, 2 medium carrots, 2 cups (150 g) raw, leafy greens, 1 large tomato, 1 large bell pepper, 1 large sweet potato, or 1 medium white potato. Grains Aim to eat 5-10 ounce-equivalents of whole grains each day. Examples of 1 ounce-equivalent of grains include 1 slice of bread, 1 cup (40 g) ready-to-eat cereal, 3 cups (24 g) popcorn, or  cup (93 g) cooked rice. Meats and other proteins Try to eat 5-7 ounce-equivalents of protein each day. Examples of 1 ounce-equivalent of protein include 1 egg,  oz nuts (12 almonds, 24 pistachios, or 7 walnut halves), 1/4 cup (90 g) cooked beans, 6 tablespoons (90 g) hummus or 1 tablespoon (16 g) peanut butter. A cut of meat or fish that is the size of a deck of cards is about 3-4 ounce-equivalents (85 g). Of the protein you eat each week, try to have at least 8 sounce (227 g) of seafood. This is about 2 servings per week. This includes salmon, trout, herring, sardines, and anchovies. Dairy Aim to eat 3 cup-equivalents of fat-free or low-fat dairy each day. Examples of 1 cup-equivalent of dairy include 1 cup (240 mL) milk, 8 ounces (250 g) yogurt, 1 ounces (44 g) natural cheese, or 1 cup (240 mL) fortified soy milk. Fats and oils Aim for about 5 teaspoons (21 g) of fats and oils per day. Choose monounsaturated fats, such as canola and olive oils, mayonnaise made with olive oil or avocado oil, avocados, peanut butter, and most nuts, or polyunsaturated fats, such as sunflower, corn, and soybean oils, walnuts, pine nuts, sesame seeds, sunflower seeds, and flaxseed. Beverages Aim for 6 eight-ounce glasses of  water per day. Limit coffee to 3-5 eight-ounce cups per day. Limit caffeinated beverages that have added calories, such as soda and energy drinks. If you drink alcohol: Limit how much you have to: 0-1 drink a day if you are female. 0-2 drinks a day if you are female. Know how much alcohol is in your drink. In the [place] one drink is one 12 oz bottle of beer (355 mL), one 5 oz glass of wine (148 mL), or one 1 oz glass of hard liquor (44 mL). Seasoning and other foods Try not to add too much salt to your food. Try using herbs and spices instead of salt. Try not to add sugar to food. This information is based on [place] nutrition guidelines. To learn more, visit DisposableNylon.be. Exact amounts may vary. You may need different amounts. This information is not intended to replace advice given to you by your health care provider. Make sure you discuss any questions you have with your health care provider. Document Revised: 02/05/2022 Document Reviewed: 02/05/2022 Elsevier Patient Education  2024 [employer].

## 2024-02-03 ENCOUNTER — Other Ambulatory Visit: Payer: Self-pay

## 2024-02-04 ENCOUNTER — Other Ambulatory Visit: Payer: Self-pay
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
## 2024-02-04 NOTE — Patient Instructions (Signed)

## 2024-02-04 NOTE — Patient Outreach (Signed)
 Complex Care Management   Visit Note  02/04/2024  Name:  Tina Hardy MRN: [id number] DOB: 10/18/1940  Situation: Referral received for Complex Care Management related to SDOH Barriers:  Transportation Lack of essential utilities all I obtained verbal consent from Patient.  Visit completed with Patient  on the phone  Background:   Past Medical History:  Diagnosis Date   Carotid arterial disease (HCC)    Colon polyp 07/18/2004   Hyperplastic   Diverticulosis    Hepatic hemangioma 07/16/2012   Hypertension    Liver hemangioma    Pernicious anemia    Rectal bleed     Assessment: SW completed a telephone outreach with patient, she states she is sure that she receieved the resources SW sent. Patient states she just got back from the store. No other resources are needed at this time. Patient has SW contact information for any future needs.  SDOH Interventions    Flowsheet Row Patient Outreach Telephone from 02/03/2024 in [place] POPULATION HEALTH DEPARTMENT Patient Outreach Telephone from 12/23/2023 in [place] POPULATION HEALTH DEPARTMENT Clinical Support from 12/19/2023 in [facility] Office Visit from 06/20/2023 in [facility] Office Visit from 02/04/2023 in [facility] Clinical Support from 11/06/2021 in [facility]  SDOH Interventions        Food Insecurity Interventions Intervention Not Indicated Community Resources Provided Other (Comment)  [VBCI referral] -- -- Intervention Not Indicated  Housing Interventions Intervention Not Indicated -- Intervention Not Indicated -- -- Intervention Not Indicated  Transportation Interventions Intervention Not Indicated -- Intervention Not Indicated -- -- --  Utilities Interventions Intervention Not Indicated Community Resources Provided Intervention Not Indicated -- -- --  Alcohol Usage Interventions -- -- Intervention Not Indicated (Score <7)  -- -- --  Depression Interventions/Treatment  -- -- Currently on Treatment, PHQ2-9 Score <4 Follow-up Not Indicated PHQ2-9 Score <4 Follow-up Not Indicated Medication, Currently on Treatment --  Financial Strain Interventions -- [employer] Provided Other (Comment)  [VBCI referral] -- -- Intervention Not Indicated  Physical Activity Interventions -- -- Patient Declined -- -- Intervention Not Indicated  Stress Interventions -- -- Intervention Not Indicated -- -- Intervention Not Indicated  Social Connections Interventions -- -- Patient Declined -- -- Intervention Not Indicated  Health Literacy Interventions -- -- Intervention Not Indicated -- -- --    Recommendation:   No recommendations at this time  Follow Up Plan:   Patient has met all care management goals. Care Management case will be closed. Patient has been provided contact information should new needs arise.   Thersia Hoar, HEDWIG, MHA [place]  [facility] Social Worker, Population Health [phone]

## 2024-02-04 NOTE — Patient Instructions (Signed)
 Visit Information  Thank you for taking time to visit with me today. Please don't hesitate to contact me if I can be of assistance to you before our next scheduled appointment.  Our next appointment is by telephone on Monday, September 29th at 2:30pm. Please call the care guide team at [phone] if you need to cancel or reschedule your appointment.   Following is a copy of your care plan:   Goals Addressed             This Visit's Progress    VBCI RN Care Plan       Problems:  Care Coordination needs related to Transportation  Goal: Over the next 30 days the Patient will work with Child psychotherapist to address Transportation related to the management of Health Maintenance as evidenced by review of electronic medical record and patient or social worker report      Interventions:   Health Maintenance Interventions: Patient interviewed about adult health maintenance status including  attending PCP appointments as scheduled.  Patient Self-Care Activities:  Call pharmacy for medication refills 3-7 days in advance of running out of medications Call provider office for new concerns or questions  Work with the social worker to address care coordination needs and will continue to work with the clinical team to address health care and disease management related needs  Plan:  Telephone follow up appointment with care management team member scheduled for:  two weeks.             Please call the [place]  National Suicide Prevention Lifeline: 364 480 3517 or TTY: 843-632-4847 TTY 779-254-9554) to talk to a trained counselor if you are experiencing a Mental Health or Behavioral Health Crisis or need someone to talk to.  Patient verbalizes understanding of instructions and care plan provided today and agrees to view in MyChart. Active MyChart status and patient understanding of how to access instructions and care plan via MyChart confirmed with patient.     Santana Stamp BSN, CCM [facility]  VBCI Population Health RN Care Manager Direct Dial: [phone]  Fax: [phone]

## 2024-02-05 ENCOUNTER — Encounter: Admitting: Nurse Practitioner

## 2024-02-05 ENCOUNTER — Telehealth: Payer: Self-pay | Admitting: Nurse Practitioner

## 2024-02-05 DIAGNOSIS — E782 Mixed hyperlipidemia: Secondary | ICD-10-CM

## 2024-02-05 DIAGNOSIS — F418 Other specified anxiety disorders: Secondary | ICD-10-CM

## 2024-02-05 DIAGNOSIS — E559 Vitamin D deficiency, unspecified: Secondary | ICD-10-CM

## 2024-02-05 DIAGNOSIS — R7309 Other abnormal glucose: Secondary | ICD-10-CM

## 2024-02-05 DIAGNOSIS — Z0000 Encounter for general adult medical examination without abnormal findings: Secondary | ICD-10-CM

## 2024-02-05 DIAGNOSIS — E538 Deficiency of other specified B group vitamins: Secondary | ICD-10-CM

## 2024-02-05 DIAGNOSIS — Z8673 Personal history of transient ischemic attack (TIA), and cerebral infarction without residual deficits: Secondary | ICD-10-CM

## 2024-02-05 DIAGNOSIS — I10 Essential (primary) hypertension: Secondary | ICD-10-CM

## 2024-02-05 DIAGNOSIS — I700 Atherosclerosis of aorta: Secondary | ICD-10-CM

## 2024-02-05 DIAGNOSIS — F5104 Psychophysiologic insomnia: Secondary | ICD-10-CM

## 2024-02-05 NOTE — Telephone Encounter (Signed)
 Patient came to the office and dropped of paperwork that needed to be completed  by provider. It was explained to the patient that if an appt is needed she will be called. Patient expressed  understanding. If able please fax as directed.

## 2024-02-05 NOTE — Telephone Encounter (Signed)
 Patient scheduled for 02/10/2024. Paperwork in the incomplete bin.

## 2024-02-08 NOTE — Patient Instructions (Incomplete)

## 2024-02-10 ENCOUNTER — Ambulatory Visit: Admitting: Nurse Practitioner

## 2024-02-10 DIAGNOSIS — I700 Atherosclerosis of aorta: Secondary | ICD-10-CM

## 2024-02-10 DIAGNOSIS — Z8673 Personal history of transient ischemic attack (TIA), and cerebral infarction without residual deficits: Secondary | ICD-10-CM

## 2024-02-10 DIAGNOSIS — F5104 Psychophysiologic insomnia: Secondary | ICD-10-CM

## 2024-02-10 DIAGNOSIS — E782 Mixed hyperlipidemia: Secondary | ICD-10-CM

## 2024-02-10 DIAGNOSIS — R7309 Other abnormal glucose: Secondary | ICD-10-CM

## 2024-02-10 DIAGNOSIS — E538 Deficiency of other specified B group vitamins: Secondary | ICD-10-CM

## 2024-02-10 DIAGNOSIS — E559 Vitamin D deficiency, unspecified: Secondary | ICD-10-CM

## 2024-02-10 DIAGNOSIS — I10 Essential (primary) hypertension: Secondary | ICD-10-CM

## 2024-02-10 DIAGNOSIS — F418 Other specified anxiety disorders: Secondary | ICD-10-CM

## 2024-02-17 ENCOUNTER — Other Ambulatory Visit: Payer: Self-pay

## 2024-02-17 NOTE — Patient Instructions (Signed)
 Visit Information  Thank you for taking time to visit with me today. Please don't hesitate to contact me if I can be of assistance to you before our next scheduled appointment.  Your next care management appointment is by telephone on Monday, October 6th 2:30pm   Please call the care guide team at [phone] if you need to cancel, schedule, or reschedule an appointment.   Please call the [place]  National Suicide Prevention Lifeline: 509 759 8303 or TTY: 775-725-0824 TTY 337-405-1678) to talk to a trained counselor if you are experiencing a Mental Health or Behavioral Health Crisis or need someone to talk to.  Santana Stamp BSN, CCM [place]  [facility] Population Health RN Care Manager Direct Dial: [phone]  Fax: [phone]

## 2024-02-17 NOTE — Patient Outreach (Signed)
 Complex Care Management   Visit Note  02/17/2024  Name:  Tina Hardy MRN: [id number] DOB: Apr 21, 1941  Situation: Referral received for Complex Care Management related to SDOH Barriers:  Transportation I obtained verbal consent from Patient.  Visit completed with Patient  on the phone.  Patient kept call brief.   Background:   Past Medical History:  Diagnosis Date   Carotid arterial disease    Colon polyp 07/18/2004   Hyperplastic   Diverticulosis    Hepatic hemangioma 07/16/2012   Hypertension    Liver hemangioma    Pernicious anemia    Rectal bleed     Assessment: Patient Reported Symptoms:  Cognitive        Neurological Neurological Review of Symptoms: Not assessed    HEENT HEENT Symptoms Reported: Not assessed      Cardiovascular Cardiovascular Symptoms Reported: Not assessed    Respiratory Respiratory Symptoms Reported: Not assesed    Endocrine Endocrine Symptoms Reported: Not assessed    Gastrointestinal Gastrointestinal Symptoms Reported: Not assessed      Genitourinary Genitourinary Symptoms Reported: Not assessed    Integumentary Integumentary Symptoms Reported: Not assessed    Musculoskeletal Musculoskelatal Symptoms Reviewed: Not assessed        Psychosocial Psychosocial Symptoms Reported: Not assessed          02/17/2024    PHQ2-9 Depression Screening   Little interest or pleasure in doing things    Feeling down, depressed, or hopeless    PHQ-2 - Total Score    Trouble falling or staying asleep, or sleeping too much    Feeling tired or having little energy    Poor appetite or overeating     Feeling bad about yourself - or that you are a failure or have let yourself or your family down    Trouble concentrating on things, such as reading the newspaper or watching television    Moving or speaking so slowly that other people could have noticed.  Or the opposite - being so fidgety or restless that you have been moving around a lot more than  usual    Thoughts that you would be better off dead, or hurting yourself in some way    PHQ2-9 Total Score    If you checked off any problems, how difficult have these problems made it for you to do your work, take care of things at home, or get along with other people    Depression Interventions/Treatment      There were no vitals filed for this visit.  Medications Reviewed Today   Medications were not reviewed in this encounter     Recommendation:   Continue Current Plan of Care This [name] will attempt to assist patient with Consulting civil engineer on next contact  Follow Up Plan:   Telephone follow-up in 1 week  [employer] BSN, CCM [place]  [facility] Population Health RN Care Manager Direct Dial: [phone]  Fax: [phone]

## 2024-02-24 ENCOUNTER — Telehealth: Payer: Self-pay

## 2024-03-15 NOTE — Patient Instructions (Signed)

## 2024-03-18 ENCOUNTER — Ambulatory Visit: Admitting: Nurse Practitioner

## 2024-03-18 ENCOUNTER — Encounter: Payer: Self-pay | Admitting: Nurse Practitioner

## 2024-03-18 VITALS — BP 130/74 | HR 80 | Temp 98.7°F | Resp 14 | Ht 65.51 in | Wt 147.0 lb

## 2024-03-18 DIAGNOSIS — Z5982 Transportation insecurity: Secondary | ICD-10-CM | POA: Diagnosis not present

## 2024-03-18 DIAGNOSIS — E782 Mixed hyperlipidemia: Secondary | ICD-10-CM

## 2024-03-18 DIAGNOSIS — Z8673 Personal history of transient ischemic attack (TIA), and cerebral infarction without residual deficits: Secondary | ICD-10-CM

## 2024-03-18 DIAGNOSIS — R011 Cardiac murmur, unspecified: Secondary | ICD-10-CM | POA: Diagnosis not present

## 2024-03-18 DIAGNOSIS — E559 Vitamin D deficiency, unspecified: Secondary | ICD-10-CM

## 2024-03-18 DIAGNOSIS — I700 Atherosclerosis of aorta: Secondary | ICD-10-CM | POA: Diagnosis not present

## 2024-03-18 DIAGNOSIS — Z0000 Encounter for general adult medical examination without abnormal findings: Secondary | ICD-10-CM

## 2024-03-18 DIAGNOSIS — R7309 Other abnormal glucose: Secondary | ICD-10-CM

## 2024-03-18 DIAGNOSIS — F418 Other specified anxiety disorders: Secondary | ICD-10-CM

## 2024-03-18 DIAGNOSIS — E538 Deficiency of other specified B group vitamins: Secondary | ICD-10-CM

## 2024-03-18 DIAGNOSIS — F5104 Psychophysiologic insomnia: Secondary | ICD-10-CM

## 2024-03-18 DIAGNOSIS — I10 Essential (primary) hypertension: Secondary | ICD-10-CM | POA: Diagnosis not present

## 2024-03-18 LAB — MICROALBUMIN, URINE WAIVED
Creatinine, Urine Waived: 200 mg/dL (ref 10–300)
Microalb, Ur Waived: 30 mg/L — ABNORMAL HIGH (ref 0–19)
Microalb/Creat Ratio: <30 mg/g (ref <30)

## 2024-03-18 LAB — BAYER DCA HB A1C WAIVED: HB A1C (BAYER DCA - WAIVED): 5.7 % — ABNORMAL HIGH (ref 4.8–5.6)

## 2024-03-18 MED ORDER — CITALOPRAM HYDROBROMIDE 10 MG PO TABS: 10.0000 mg | ORAL_TABLET | Freq: Every day | ORAL | 4 refills | Status: AC

## 2024-03-18 MED ORDER — ROSUVASTATIN CALCIUM 10 MG PO TABS: 10.0000 mg | ORAL_TABLET | Freq: Every day | ORAL | 4 refills | Status: AC

## 2024-03-18 MED ORDER — AMLODIPINE BESYLATE 5 MG PO TABS: 5.0000 mg | ORAL_TABLET | Freq: Every day | ORAL | 4 refills | Status: DC

## 2024-03-18 MED ORDER — CLOPIDOGREL BISULFATE 75 MG PO TABS: ORAL_TABLET | ORAL | 4 refills | Status: AC

## 2024-03-18 MED ORDER — BUSPIRONE HCL 5 MG PO TABS: 5.0000 mg | ORAL_TABLET | Freq: Two times a day (BID) | ORAL | 4 refills | Status: AC
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
## 2024-03-18 NOTE — Assessment & Plan Note (Signed)
Ongoing and chronic. Denies SI/HI.  Continue Celexa and Buspar which offer her benefit.  Would avoid benzo use due to age and risk for chronic use.  Does not wish to attend psychiatry.  Return in 6 months. 

## 2024-03-18 NOTE — Assessment & Plan Note (Signed)
 Referral to [facility] for assistance.

## 2024-03-18 NOTE — Assessment & Plan Note (Signed)
Recheck A1c today, no symptoms reported.  Last check 5.9%.  A1c recheck today.

## 2024-03-18 NOTE — Assessment & Plan Note (Signed)
 New on exam today. She denies any symptoms. ?Aortic stenosis. Will obtain echo to further assess. Educated patient on this and discussed symptoms to alert provider of.

## 2024-03-18 NOTE — Assessment & Plan Note (Signed)
 Chronic, stable.  BP at goal for age on recheck today.  Continue current medication regimen and adjust as needed.  Labs today: CBC, CMP, TSH, urine ALB.  Recommend checking BP at home regularly + focus on DASH diet.  Refills sent. [facility] referral to help with transportation to visits and back to [facility].

## 2024-03-18 NOTE — Assessment & Plan Note (Signed)
Chronic, ongoing.  Recommend she take Vitamin B12 1000 MCG daily, recheck today.

## 2024-03-18 NOTE — Assessment & Plan Note (Signed)
Chronic, ongoing.  Continue current medication regimen and adjust as needed.  Lipid panel today.  Return in 6 months. 

## 2024-03-18 NOTE — Assessment & Plan Note (Signed)
 History of old infarct.  Highly recommend she continue her statin and ASA daily. BP goal <130/80, LDL <55, and A1c <6.5%. Return to vascular as needed.  Monitor memory, some short term changes ongoing with no worsening.  Send to neurology as needed, refuses at this time.

## 2024-03-18 NOTE — Assessment & Plan Note (Signed)
 Chronic, ongoing.  Recommend she take Vitamin D3 2000 units consistently daily. Check level today.

## 2024-03-18 NOTE — Assessment & Plan Note (Signed)
 Chronic.  Noted on past CT abdomen 08/29/2017.  Recommend she continue ASA and statin daily for prevention.

## 2024-03-19 ENCOUNTER — Ambulatory Visit: Payer: Self-pay | Admitting: Nurse Practitioner

## 2024-03-19 ENCOUNTER — Telehealth: Payer: Self-pay

## 2024-03-19 LAB — LIPID PANEL W/O CHOL/HDL RATIO
Cholesterol, Total: 190 mg/dL (ref 100–199)
HDL: 47 mg/dL (ref >39)
LDL Chol Calc (NIH): 91 mg/dL (ref 0–99)
Triglycerides: 311 mg/dL — ABNORMAL HIGH (ref 0–149)
VLDL Cholesterol Cal: 52 mg/dL — ABNORMAL HIGH (ref 5–40)

## 2024-03-19 LAB — CBC WITH DIFFERENTIAL/PLATELET
Basophils Absolute: 0.1 x10E3/uL (ref 0.0–0.2)
Basos: 1 %
EOS (ABSOLUTE): 0.1 x10E3/uL (ref 0.0–0.4)
Eos: 1 %
Hematocrit: 47.3 % — ABNORMAL HIGH (ref 34.0–46.6)
Hemoglobin: 13.9 g/dL (ref 11.1–15.9)
Immature Grans (Abs): 0 x10E3/uL (ref 0.0–0.1)
Immature Granulocytes: 0 %
Lymphocytes Absolute: 2.5 x10E3/uL (ref 0.7–3.1)
Lymphs: 24 %
MCH: 28.3 pg (ref 26.6–33.0)
MCHC: 29.4 g/dL — ABNORMAL LOW (ref 31.5–35.7)
MCV: 96 fL (ref 79–97)
Monocytes Absolute: 0.9 x10E3/uL (ref 0.1–0.9)
Monocytes: 9 %
Neutrophils Absolute: 6.9 x10E3/uL (ref 1.4–7.0)
Neutrophils: 65 %
Platelets: 481 x10E3/uL — ABNORMAL HIGH (ref 150–450)
RBC: 4.92 x10E6/uL (ref 3.77–5.28)
RDW: 13.6 % (ref 11.7–15.4)
WBC: 10.6 x10E3/uL (ref 3.4–10.8)

## 2024-03-19 LAB — COMPREHENSIVE METABOLIC PANEL WITH GFR
ALT: 18 IU/L (ref 0–32)
AST: 23 IU/L (ref 0–40)
Albumin: 4.1 g/dL (ref 3.7–4.7)
Alkaline Phosphatase: 68 IU/L (ref 48–129)
BUN/Creatinine Ratio: 19 (ref 12–28)
BUN: 12 mg/dL (ref 8–27)
Bilirubin Total: <0.2 mg/dL (ref 0.0–1.2)
CO2: 24 mmol/L (ref 20–29)
Calcium: 9.6 mg/dL (ref 8.7–10.3)
Chloride: 103 mmol/L (ref 96–106)
Creatinine, Ser: 0.64 mg/dL (ref 0.57–1.00)
Globulin, Total: 2.4 g/dL (ref 1.5–4.5)
Glucose: 84 mg/dL (ref 70–99)
Potassium: 4.4 mmol/L (ref 3.5–5.2)
Sodium: 140 mmol/L (ref 134–144)
Total Protein: 6.5 g/dL (ref 6.0–8.5)
eGFR: 89 mL/min/1.73 (ref >59)

## 2024-03-19 LAB — TSH: TSH: 1.99 u[IU]/mL (ref 0.450–4.500)

## 2024-03-19 LAB — VITAMIN D 25 HYDROXY (VIT D DEFICIENCY, FRACTURES): Vit D, 25-Hydroxy: 26.3 ng/mL — ABNORMAL LOW (ref 30.0–100.0)

## 2024-03-19 LAB — VITAMIN B12: Vitamin B-12: 424 pg/mL (ref 232–1245)

## 2024-03-19 NOTE — Telephone Encounter (Signed)
 Referral team- can you guys check into getting this authorization please? [name] ordered the Echo.

## 2024-03-19 NOTE — Progress Notes (Signed)
 Please call patient to alert her labs have returned: - CBC shows no anemia or infection. - Kidney and liver function are normal. - Lipid panel is showing some trends up from baseline. Please ensure you take the Rosuvastatin  that is ordered for you daily. - Vitamin D  is a little low, please ensure to take Vitamin D3 2000 units daily. - Remainder of labs stable. Any questions? Keep being amazing!!  Thank you for allowing me to participate in your care.  I appreciate you. Kindest regards, Samanatha Brammer

## 2024-03-19 NOTE — Telephone Encounter (Signed)
 Copied from CRM [id number]. Topic: Clinical - Request for Lab/Test Order >> Mar 18, 2024  2:32 PM Mia F wrote: Reason for CRM:  [facility] need a PA for pt to get an echocardiogram. No appt until PA is complete   Please call back at [phone]

## 2024-03-20 ENCOUNTER — Telehealth: Payer: Self-pay

## 2024-03-20 NOTE — Progress Notes (Signed)
 Complex Care Management Note Care Guide Note  03/20/2024 Name: Helina Hullum MRN: [id number] DOB: Mar 18, 1941   Complex Care Management Outreach Attempts: An unsuccessful telephone outreach was attempted today to offer the patient information about available complex care management services.  Follow Up Plan:  Additional outreach attempts will be made to offer the patient complex care management information and services.   Encounter Outcome:  No Answer  Kennedy Brines [facility]  [facility] Guide Direct Dial: [phone]  Fax: [phone] Website: [email or web address]

## 2024-03-23 ENCOUNTER — Telehealth: Payer: Self-pay

## 2024-03-23 NOTE — Progress Notes (Signed)
 Complex Care Management Note Care Guide Note  03/23/2024 Name: Tina Hardy MRN: [id number] DOB: May 05, 1941   Complex Care Management Outreach Attempts: A second unsuccessful outreach was attempted today to offer the patient with information about available complex care management services.  Follow Up Plan:  Additional outreach attempts will be made to offer the patient complex care management information and services.   Encounter Outcome:  No Answer  Brendalee Matthies [facility]  [facility] Guide Direct Dial: [phone]  Fax: [phone] Website: [email or web address]

## 2024-03-25 ENCOUNTER — Telehealth: Payer: Self-pay

## 2024-03-25 NOTE — Progress Notes (Signed)
 Complex Care Management Note Care Guide Note  03/25/2024 Name: Tina Hardy MRN: [id number] DOB: May 20, 1941   Complex Care Management Outreach Attempts: A third unsuccessful outreach was attempted today to offer the patient with information about available complex care management services.  Follow Up Plan:  No further outreach attempts will be made at this time. We have been unable to contact the patient to offer or enroll patient in complex care management services.  Encounter Outcome:  No Answer  Lateya Dauria [facility]  [facility] Guide Direct Dial: [phone]  Fax: [phone] Website: [email or web address]

## 2024-04-01 ENCOUNTER — Ambulatory Visit
Admission: RE | Admit: 2024-04-01 | Discharge: 2024-04-01 | Disposition: A | Discharge status: Home / Self Care | Source: Ambulatory Visit | Attending: Nurse Practitioner | Admitting: Nurse Practitioner

## 2024-04-01 ENCOUNTER — Encounter: Payer: Self-pay | Admitting: Nurse Practitioner

## 2024-04-01 DIAGNOSIS — R011 Cardiac murmur, unspecified: Secondary | ICD-10-CM | POA: Insufficient documentation

## 2024-04-01 DIAGNOSIS — I340 Nonrheumatic mitral (valve) insufficiency: Secondary | ICD-10-CM | POA: Diagnosis not present

## 2024-04-01 DIAGNOSIS — I10 Essential (primary) hypertension: Secondary | ICD-10-CM | POA: Insufficient documentation

## 2024-04-01 DIAGNOSIS — I350 Nonrheumatic aortic (valve) stenosis: Secondary | ICD-10-CM | POA: Insufficient documentation

## 2024-04-01 DIAGNOSIS — I5020 Unspecified systolic (congestive) heart failure: Secondary | ICD-10-CM | POA: Insufficient documentation

## 2024-04-01 LAB — ECHOCARDIOGRAM COMPLETE
AR max vel: 1.59 cm2
AV Area VTI: 1.58 cm2
AV Area mean vel: 1.57 cm2
AV Mean grad: 11 mmHg
AV Peak grad: 12.1 mmHg
Ao pk vel: 1.74 m/s
Area-P 1/2: 9.48 cm2
MV VTI: 2.92 cm2
S' Lateral: 2.8 cm

## 2024-04-01 NOTE — Progress Notes (Signed)
*  PRELIMINARY RESULTS* Echocardiogram 2D Echocardiogram has been performed.  Floydene Harder 04/01/2024, 1:38 PM

## 2024-04-02 NOTE — Progress Notes (Signed)
 Attempted to call patient at [phone] unable to leave message as mailbox full, then attempted to call her son Bradly who is DPR, but his number is disconnected or not in service. Need to speak with patient ASAP to go over echo results, if she calls please schedule her. May overbook if needed. Please also continue to attempt calling, starting tomorrow. I did send message via MyChart to call as well, but she does not commonly check. I will write letter to send as well.

## 2024-04-03 ENCOUNTER — Encounter: Payer: Self-pay | Admitting: Nurse Practitioner

## 2024-04-03 ENCOUNTER — Ambulatory Visit (INDEPENDENT_AMBULATORY_CARE_PROVIDER_SITE_OTHER): Admitting: Nurse Practitioner

## 2024-04-03 VITALS — BP 133/81 | HR 89 | Temp 99.1°F | Ht 65.0 in | Wt 144.0 lb

## 2024-04-03 DIAGNOSIS — I10 Essential (primary) hypertension: Secondary | ICD-10-CM

## 2024-04-03 DIAGNOSIS — I5020 Unspecified systolic (congestive) heart failure: Secondary | ICD-10-CM

## 2024-04-03 MED ORDER — SACUBITRIL-VALSARTAN 49-51 MG PO TABS: 1.0000 | ORAL_TABLET | Freq: Two times a day (BID) | ORAL | 3 refills | Status: AC

## 2024-04-03 NOTE — Patient Instructions (Signed)
 Heart Failure and Exercise: What to Know Heart failure is a long-term condition where the heart can't pump enough blood through the body. When this happens, parts of the body don't get the blood and oxygen they need. Living with heart failure can be a challenge. But there are things you can do to help improve your symptoms. One thing is to follow the instructions from your health care provider about living a healthy lifestyle. This includes choosing the right exercise plan. Doing daily physical activity is important when you have heart failure. You may have some limits on your activity, so talk to your provider before doing any exercises. What are the benefits of exercise? Exercise may: Make your heart muscles stronger and help your body use oxygen better. This helps with symptoms like tiredness and shortness of breath. Help your bones stay strong. Improve your blood flow. Help your mental health by lowering the risk of depression. Decrease your chance of going to the hospital for heart failure. Improve your risk factors by: Lowering your blood pressure. Lowering your cholesterol. Improving diabetes if you have it. Helping you to lose weight. What is an exercise plan? An exercise plan is a set of specific exercises and activities. You'll work with your provider to create the plan that works for you. The plan may include: Cardiac rehabilitation. These are supervised exercises that are designed to help your heart. Different types of exercises such as: Strengthening. Balance. Flexibility. Aerobic. What are strengthening exercises? Strengthening exercises involve using resistance to improve your muscle strength. These usually have repeated motions. They can include: Lifting weights. Using weight machines. Using resistance tubes and bands. Using your body weight, such as doing push-ups or squats. What are balance exercises? Balance exercises strengthen the muscles of the back, belly, and  pelvis (core muscles). They improve your balance and can lower your risk of falling. These may include: Standing on one leg. Walking backward, sideways, and in a straight line. Standing up after sitting, without using your hands. Shifting your weight from one leg to the other. Doing tai chi. This uses slow movements and deep breathing. Doing yoga. How can I increase my flexibility? Flexibility exercises can lengthen your muscles, improve your range of motion, and help your joints. They can also lower your risk of falling. These may include: Doing tai chi. Doing yoga. Doing Pilates. Stretching. How much aerobic exercise should I get?  Aerobic exercise strengthens your breathing and blood flow. It also increases your body's use of oxygen. This type of exercise causes your heart to beat faster while you're doing it. Examples include biking, walking, and swimming.  Talk to your provider to find out how much aerobic exercise is safe for you. To do this type of exercise: Start slowly, limiting the amount of time at first. You may need to start with 5 minutes every day. Slowly add more minutes until you can safely do at least 30 minutes, at least 5 days a week. This information is not intended to replace advice given to you by your health care provider. Make sure you discuss any questions you have with your health care provider. Document Revised: 12/20/2022 Document Reviewed: 12/20/2022 Elsevier Patient Education  2024 [employer].

## 2024-04-03 NOTE — Progress Notes (Signed)
 BP 133/81 (BP Location: Left Arm, Cuff Size: Normal)   Pulse 89   Temp 99.1 F (37.3 C)   Ht 5' 5 (1.651 m)   Wt 144 lb (65.3 kg)   SpO2 94%   BMI 23.96 kg/m    Subjective:    Patient ID: Tina Hardy, female    DOB: 1940-07-16, 83 y.o.   MRN: [id number]  HPI: Tina Hardy is a 83 y.o. female  Chief Complaint  Patient presents with   Hypertension    Review echo results.   Friend at bedside to assist with HPI and wishes to record info so they can alert son and niece to diagnosis.  HYPERTENSION without Chronic Kidney Disease Presents today due to recent visit noting significant new murmur on exam, echo performed due to this and EF 35-40% with moderate decreased function of left ventricle. Taking Amlodipine , Rosuvastatin  + Plavix  & ASA.  History of stroke and is to follow with vascular as needed. Last saw 08/31/19. She reports overall feeling well with no HF symptoms. Hypertension status: stable  Satisfied with current treatment? yes Duration of hypertension: chronic BP monitoring frequency:  not checking BP range:  BP medication side effects:  no Medication compliance: good compliance Aspirin : yes Recurrent headaches: no Visual changes: no Palpitations: no Dyspnea: no Chest pain: no Lower extremity edema: no Dizzy/lightheaded: no   Relevant past medical, surgical, family and social history reviewed and updated as indicated. Interim medical history since our last visit reviewed. Allergies and medications reviewed and updated.  Review of Systems  Constitutional:  Negative for activity change, appetite change, diaphoresis, fatigue and fever.  Respiratory:  Negative for cough, chest tightness and shortness of breath.   Cardiovascular:  Negative for chest pain, palpitations and leg swelling.  Gastrointestinal: Negative.   Endocrine: Negative.   Neurological: Negative.   Psychiatric/Behavioral: Negative.      Per HPI unless specifically indicated above      Objective:    BP 133/81 (BP Location: Left Arm, Cuff Size: Normal)   Pulse 89   Temp 99.1 F (37.3 C)   Ht 5' 5 (1.651 m)   Wt 144 lb (65.3 kg)   SpO2 94%   BMI 23.96 kg/m   Wt Readings from Last 3 Encounters:  04/03/24 144 lb (65.3 kg)  03/18/24 147 lb (66.7 kg)  12/19/23 145 lb (65.8 kg)    Physical Exam Vitals and nursing note reviewed.  Constitutional:      General: She is awake. She is not in acute distress.    Appearance: She is well-developed and well-groomed. She is not ill-appearing or toxic-appearing.  HENT:     Head: Normocephalic.     Right Ear: Hearing and external ear normal.     Left Ear: Hearing and external ear normal.  Eyes:     General: Lids are normal.        Right eye: No discharge.        Left eye: No discharge.     Conjunctiva/sclera: Conjunctivae normal.     Pupils: Pupils are equal, round, and reactive to light.  Neck:     Thyroid : No thyromegaly.     Vascular: No carotid bruit.  Cardiovascular:     Rate and Rhythm: Normal rate and regular rhythm.     Heart sounds: Murmur heard.     Systolic murmur is present with a grade of 3/6.     No gallop.     Comments: No irregular beats noted on exam today.  Pulmonary:     Effort: Pulmonary effort is normal. No accessory muscle usage or respiratory distress.     Breath sounds: Normal breath sounds. No decreased breath sounds, wheezing or rales.  Abdominal:     General: Bowel sounds are normal. There is no distension.     Palpations: Abdomen is soft.     Tenderness: There is no abdominal tenderness.  Musculoskeletal:     Cervical back: Normal range of motion and neck supple.     Right lower leg: No edema.     Left lower leg: No edema.  Lymphadenopathy:     Cervical: No cervical adenopathy.  Skin:    General: Skin is warm and dry.  Neurological:     Mental Status: She is alert and oriented to person, place, and time.     Deep Tendon Reflexes: Reflexes are normal and symmetric.     Reflex  Scores:      Brachioradialis reflexes are 2+ on the right side and 2+ on the left side.      Patellar reflexes are 2+ on the right side and 2+ on the left side. Psychiatric:        Attention and Perception: Attention normal.        Mood and Affect: Mood normal.        Speech: Speech normal.        Behavior: Behavior normal. Behavior is cooperative.        Thought Content: Thought content normal.    Results for orders placed or performed during the hospital encounter of 04/01/24  ECHOCARDIOGRAM COMPLETE   Collection Time: 04/01/24  1:38 PM  Result Value Ref Range   Ao pk vel 1.74 m/s   AV Area VTI 1.58 cm2   AR max vel 1.59 cm2   AV Mean grad 11.0 mmHg   AV Peak grad 12.1 mmHg   S' Lateral 2.80 cm   AV Area mean vel 1.57 cm2   Area-P 1/2 9.48 cm2   MV VTI 2.92 cm2   Est EF 35 - 40%       Assessment & Plan:   Problem List Items Addressed This Visit       Cardiovascular and Mediastinum   Hypertension (Chronic)   Chronic, stable.  BP at goal for age on check today.  Stopping Amlodipine  due to new diagnosis of HFrEF, EF 35-40% on 03/18/24.  Start Entresto, refer to HF plan of care for further on this, and referral to cardiology.  Labs today: up to date.  Recommend checking BP at home regularly + focus on DASH diet.  Obtain EKG at next visit, if seen here prior to cardiology.       Relevant Medications   sacubitril-valsartan (ENTRESTO) 49-51 MG   Other Relevant Orders   Ambulatory referral to Cardiology   Heart failure with reduced ejection fraction (HCC) - Primary   New diagnosis on 03/18/24 after noting new murmur on exam and echo showing EF 35-40% and moderate decreased LV function. She has no symptoms today. Discussed with her friend and her will send in [place] at initial dose of 49-51 MG BID, plan to increase next visit if tolerating and add on BB if has not seen cardiology yet. Recent K+ 4.4. Stop Amlodipine . Alerted them if Entresto too costly to alert PCP and can send  in Valsartan or see if assistance available. Urgent referral to cardiology for further assessment and recommendations. Recommend: - Reminded to call for an overnight weight gain of >2  pounds or a weekly weight gain of >5 pounds - not adding salt to food and read food labels. Reviewed the importance of keeping daily sodium intake to 2000mg  daily.  - No Ibuprofen  products at home.      Relevant Medications   sacubitril-valsartan (ENTRESTO) 49-51 MG   Other Relevant Orders   Ambulatory referral to Cardiology    I personally spent a total of 25 minutes in the care of the patient today including preparing to see the patient, getting/reviewing separately obtained history, performing a medically appropriate exam/evaluation, counseling and educating, placing orders, and communicating results.   Follow up plan: Return in about 5 weeks (around 05/08/2024) for CHF and HTN.

## 2024-04-03 NOTE — Assessment & Plan Note (Addendum)
 Chronic, stable.  BP at goal for age on check today.  Stopping Amlodipine  due to new diagnosis of HFrEF, EF 35-40% on 03/18/24.  Start Entresto, refer to HF plan of care for further on this, and referral to cardiology.  Labs today: up to date.  Recommend checking BP at home regularly + focus on DASH diet.  Obtain EKG at next visit, if seen here prior to cardiology.

## 2024-04-03 NOTE — Assessment & Plan Note (Addendum)
 New diagnosis on 03/18/24 after noting new murmur on exam and echo showing EF 35-40% and moderate decreased LV function. She has no symptoms today. Discussed with her friend and her will send in [place] at initial dose of 49-51 MG BID, plan to increase next visit if tolerating and add on BB if has not seen cardiology yet. Recent K+ 4.4. Stop Amlodipine . Alerted them if Entresto too costly to alert PCP and can send in Valsartan or see if assistance available. Urgent referral to cardiology for further assessment and recommendations. Recommend: - Reminded to call for an overnight weight gain of >2 pounds or a weekly weight gain of >5 pounds - not adding salt to food and read food labels. Reviewed the importance of keeping daily sodium intake to 2000mg  daily.  - No Ibuprofen  products at home.

## 2024-04-10 ENCOUNTER — Telehealth: Payer: Self-pay

## 2024-04-10 NOTE — Telephone Encounter (Signed)
 Copied from CRM #[id number]. Topic: Referral - Status >> Apr 10, 2024  9:26 AM Harlene ORN wrote: Reason for CRM: [name] friend called requesting a follow up on the patient's referral for Cardiology. Phone for Katheryn (friend): [phone] (DPR-approved)

## 2024-04-17 ENCOUNTER — Other Ambulatory Visit: Payer: Self-pay

## 2024-04-17 NOTE — Patient Outreach (Signed)
 Complex Care Management   Visit Note  04/17/2024  Name:  Tina Hardy MRN: [id number] DOB: 10/29/1940  Situation: Referral received for Complex Care Management related to SDOH Barriers:  Transportation I obtained verbal consent from Patient.  Visit completed with Tina Hardy  on the phone.  She is expecting a call from Cardiology in [place] for a new referral placed by [name] Cannady at [place].  She does not know how to access her voicemail if someone calls from the Cardiology office to try and schedule an appointment.  [facility] Care Navigator office closed today, not able to connect patient to care navigator.  Background:   Past Medical History:  Diagnosis Date   Carotid arterial disease    Colon polyp 07/18/2004   Hyperplastic   Diverticulosis    Hepatic hemangioma 07/16/2012   Hypertension    Liver hemangioma    Pernicious anemia    Rectal bleed     Assessment: Patient Reported Symptoms:  Cognitive Cognitive Status: No symptoms reported, Alert and oriented to person, place, and time, Normal speech and language skills, Requires Assistance Decision Making      Neurological Neurological Review of Symptoms: No symptoms reported    HEENT HEENT Symptoms Reported: Not assessed      Cardiovascular Cardiovascular Symptoms Reported: No symptoms reported    Respiratory Respiratory Symptoms Reported: No symptoms reported    Endocrine Endocrine Symptoms Reported: Not assessed Is patient diabetic?: No    Gastrointestinal Gastrointestinal Symptoms Reported: No symptoms reported      Genitourinary Genitourinary Symptoms Reported: No symptoms reported    Integumentary Integumentary Symptoms Reported: Not assessed    Musculoskeletal Musculoskelatal Symptoms Reviewed: No symptoms reported        Psychosocial Psychosocial Symptoms Reported: No symptoms reported          04/17/2024    PHQ2-9 Depression Screening   Little interest or pleasure in doing things    Feeling  down, depressed, or hopeless    PHQ-2 - Total Score    Trouble falling or staying asleep, or sleeping too much    Feeling tired or having little energy    Poor appetite or overeating     Feeling bad about yourself - or that you are a failure or have let yourself or your family down    Trouble concentrating on things, such as reading the newspaper or watching television    Moving or speaking so slowly that other people could have noticed.  Or the opposite - being so fidgety or restless that you have been moving around a lot more than usual    Thoughts that you would be better off dead, or hurting yourself in some way    PHQ2-9 Total Score    If you checked off any problems, how difficult have these problems made it for you to do your work, take care of things at home, or get along with other people    Depression Interventions/Treatment      There were no vitals filed for this visit. Pain Scale: 0-10 Pain Score: 0-No pain  Medications Reviewed Today   Medications were not reviewed in this encounter     Recommendation:   This [name] will call [facility] on 04/20/24 to inquire which Cardiology office patient has been referred.  Patient and I will also perform conference call with [facility] to be connected with Care Navigator and to inquire of transportation service.   Follow Up Plan:   Telephone follow-up 04/20/24  Santana Stamp  BSN, CCM [place]  [facility] Population Health RN Care Manager Direct Dial: [phone]  Fax: [phone]

## 2024-04-17 NOTE — Patient Instructions (Signed)
 Visit Information  Thank you for taking time to visit with me today. Please don't hesitate to contact me if I can be of assistance to you before our next scheduled appointment.  Your next care management appointment is by telephone on Monday, December 1st at 3:45pm.   Please call the care guide team at [phone] if you need to cancel, schedule, or reschedule an appointment.   Please call the [place]  National Suicide Prevention Lifeline: 3303113583 or TTY: 9143874270 TTY [phone]) to talk to a trained counselor if you are experiencing a Mental Health or Behavioral Health Crisis or need someone to talk to.  Santana Stamp BSN, CCM [place]  [facility] Population Health RN Care Manager Direct Dial: [phone]  Fax: [phone]

## 2024-04-20 ENCOUNTER — Telehealth: Payer: Self-pay | Admitting: Nurse Practitioner

## 2024-04-20 ENCOUNTER — Telehealth: Payer: Self-pay

## 2024-04-20 NOTE — Telephone Encounter (Unsigned)
 Copied from CRM #[id number]. Topic: Referral - Status >> Apr 20, 2024  2:07 PM Amy B wrote: Reason for CRM: Patient requests referral to cardiology be sent to Dr. Annalee Casa [facility] on [facility].  Please fax to [phone]

## 2024-04-20 NOTE — Telephone Encounter (Signed)
 Can referral be placed for the patient?

## 2024-04-21 ENCOUNTER — Telehealth: Payer: Self-pay

## 2024-04-21 DIAGNOSIS — I5020 Unspecified systolic (congestive) heart failure: Secondary | ICD-10-CM

## 2024-04-21 NOTE — Addendum Note (Signed)
 Addended by: Kailan Carmen T on: 04/21/2024 04:42 PM   Modules accepted: Orders

## 2024-04-21 NOTE — Telephone Encounter (Signed)
 Copied from CRM [id number]. Topic: Referral - Question >> Apr 21, 2024  4:25 PM Aisha D wrote: Reason for CRM: Patient requests referral to cardiology be sent to Dr. Annalee Casa [facility] on [facility]  Please fax to [phone]  UPDATE: The pt's friend Katheryn is calling to check the status of the referral request. I informed the pt's friend Katheryn that the referral is being worked on, Katheryn would like a callback once the referral has been submitted. CB: [phone]

## 2024-04-22 NOTE — Telephone Encounter (Signed)
 Called and LVM for Katheryn letting her know that the referral has been entered for the patient.

## 2024-04-30 ENCOUNTER — Telehealth: Payer: Self-pay

## 2024-05-08 ENCOUNTER — Other Ambulatory Visit: Payer: Self-pay

## 2024-05-08 ENCOUNTER — Encounter
Admission: EM | Disposition: A | Discharge status: Home / Self Care | Payer: Self-pay | Source: Home / Self Care | Attending: Surgery

## 2024-05-08 ENCOUNTER — Emergency Department

## 2024-05-08 ENCOUNTER — Inpatient Hospital Stay
Admission: EM | Admit: 2024-05-08 | Discharge: 2024-05-13 | DRG: 329 | Disposition: A | Discharge status: Home / Self Care | Attending: Surgery | Admitting: Surgery

## 2024-05-08 ENCOUNTER — Ambulatory Visit: Payer: Self-pay

## 2024-05-08 ENCOUNTER — Inpatient Hospital Stay: Admitting: Anesthesiology

## 2024-05-08 DIAGNOSIS — E872 Acidosis, unspecified: Secondary | ICD-10-CM | POA: Diagnosis present

## 2024-05-08 DIAGNOSIS — R54 Age-related physical debility: Secondary | ICD-10-CM | POA: Diagnosis present

## 2024-05-08 DIAGNOSIS — E876 Hypokalemia: Secondary | ICD-10-CM | POA: Insufficient documentation

## 2024-05-08 DIAGNOSIS — Z885 Allergy status to narcotic agent status: Secondary | ICD-10-CM

## 2024-05-08 DIAGNOSIS — F32A Depression, unspecified: Secondary | ICD-10-CM | POA: Diagnosis present

## 2024-05-08 DIAGNOSIS — Z87892 Personal history of anaphylaxis: Secondary | ICD-10-CM

## 2024-05-08 DIAGNOSIS — Z9049 Acquired absence of other specified parts of digestive tract: Secondary | ICD-10-CM

## 2024-05-08 DIAGNOSIS — Z9861 Coronary angioplasty status: Secondary | ICD-10-CM | POA: Diagnosis not present

## 2024-05-08 DIAGNOSIS — I447 Left bundle-branch block, unspecified: Secondary | ICD-10-CM | POA: Diagnosis present

## 2024-05-08 DIAGNOSIS — I110 Hypertensive heart disease with heart failure: Secondary | ICD-10-CM | POA: Diagnosis present

## 2024-05-08 DIAGNOSIS — I429 Cardiomyopathy, unspecified: Secondary | ICD-10-CM | POA: Diagnosis present

## 2024-05-08 DIAGNOSIS — Z9889 Other specified postprocedural states: Secondary | ICD-10-CM

## 2024-05-08 DIAGNOSIS — F419 Anxiety disorder, unspecified: Secondary | ICD-10-CM | POA: Diagnosis present

## 2024-05-08 DIAGNOSIS — K4030 Unilateral inguinal hernia, with obstruction, without gangrene, not specified as recurrent: Secondary | ICD-10-CM | POA: Diagnosis present

## 2024-05-08 DIAGNOSIS — Z860102 Personal history of hyperplastic colon polyps: Secondary | ICD-10-CM

## 2024-05-08 DIAGNOSIS — I700 Atherosclerosis of aorta: Secondary | ICD-10-CM | POA: Diagnosis present

## 2024-05-08 DIAGNOSIS — K4130 Unilateral femoral hernia, with obstruction, without gangrene, not specified as recurrent: Secondary | ICD-10-CM | POA: Diagnosis present

## 2024-05-08 DIAGNOSIS — Z7902 Long term (current) use of antithrombotics/antiplatelets: Secondary | ICD-10-CM

## 2024-05-08 DIAGNOSIS — I454 Nonspecific intraventricular block: Secondary | ICD-10-CM | POA: Diagnosis present

## 2024-05-08 DIAGNOSIS — Z95828 Presence of other vascular implants and grafts: Secondary | ICD-10-CM

## 2024-05-08 DIAGNOSIS — A419 Sepsis, unspecified organism: Principal | ICD-10-CM

## 2024-05-08 DIAGNOSIS — I5022 Chronic systolic (congestive) heart failure: Secondary | ICD-10-CM | POA: Diagnosis present

## 2024-05-08 DIAGNOSIS — K0889 Other specified disorders of teeth and supporting structures: Secondary | ICD-10-CM | POA: Diagnosis present

## 2024-05-08 DIAGNOSIS — Z8249 Family history of ischemic heart disease and other diseases of the circulatory system: Secondary | ICD-10-CM

## 2024-05-08 DIAGNOSIS — I2510 Atherosclerotic heart disease of native coronary artery without angina pectoris: Secondary | ICD-10-CM | POA: Diagnosis present

## 2024-05-08 DIAGNOSIS — R011 Cardiac murmur, unspecified: Secondary | ICD-10-CM | POA: Diagnosis present

## 2024-05-08 DIAGNOSIS — E785 Hyperlipidemia, unspecified: Secondary | ICD-10-CM | POA: Diagnosis present

## 2024-05-08 DIAGNOSIS — Z8673 Personal history of transient ischemic attack (TIA), and cerebral infarction without residual deficits: Secondary | ICD-10-CM | POA: Diagnosis not present

## 2024-05-08 DIAGNOSIS — Z87891 Personal history of nicotine dependence: Secondary | ICD-10-CM

## 2024-05-08 DIAGNOSIS — K55021 Focal (segmental) acute infarction of small intestine: Secondary | ICD-10-CM | POA: Diagnosis present

## 2024-05-08 DIAGNOSIS — Z8719 Personal history of other diseases of the digestive system: Secondary | ICD-10-CM

## 2024-05-08 DIAGNOSIS — K567 Ileus, unspecified: Secondary | ICD-10-CM | POA: Diagnosis not present

## 2024-05-08 DIAGNOSIS — Z833 Family history of diabetes mellitus: Secondary | ICD-10-CM

## 2024-05-08 DIAGNOSIS — Z8379 Family history of other diseases of the digestive system: Secondary | ICD-10-CM

## 2024-05-08 DIAGNOSIS — Z79899 Other long term (current) drug therapy: Secondary | ICD-10-CM

## 2024-05-08 DIAGNOSIS — K56609 Unspecified intestinal obstruction, unspecified as to partial versus complete obstruction: Secondary | ICD-10-CM

## 2024-05-08 DIAGNOSIS — Z91030 Bee allergy status: Secondary | ICD-10-CM

## 2024-05-08 DIAGNOSIS — Z7982 Long term (current) use of aspirin: Secondary | ICD-10-CM | POA: Diagnosis not present

## 2024-05-08 DIAGNOSIS — I10 Essential (primary) hypertension: Secondary | ICD-10-CM | POA: Diagnosis not present

## 2024-05-08 DIAGNOSIS — Z955 Presence of coronary angioplasty implant and graft: Secondary | ICD-10-CM

## 2024-05-08 DIAGNOSIS — Z880 Allergy status to penicillin: Secondary | ICD-10-CM

## 2024-05-08 DIAGNOSIS — K9189 Other postprocedural complications and disorders of digestive system: Secondary | ICD-10-CM | POA: Diagnosis not present

## 2024-05-08 HISTORY — PX: BOWEL RESECTION: SHX1257

## 2024-05-08 HISTORY — PX: HERNIORRHAPHY, INGUINAL, ROBOT-ASSISTED, LAPAROSCOPIC: SHX7585

## 2024-05-08 LAB — LACTIC ACID, PLASMA
Lactic Acid, Venous: 2.4 mmol/L (ref 0.5–1.9)
Lactic Acid, Venous: 1.8 mmol/L (ref 0.5–1.9)

## 2024-05-08 LAB — PROTIME-INR
INR: 0.9 (ref 0.8–1.2)
Prothrombin Time: 12.9 s (ref 11.4–15.2)

## 2024-05-08 LAB — CBC WITH DIFFERENTIAL/PLATELET
Abs Immature Granulocytes: 0.08 K/uL — ABNORMAL HIGH (ref 0.00–0.07)
Basophils Absolute: 0 K/uL (ref 0.0–0.1)
Basophils Relative: 0 %
Eosinophils Absolute: 5.2 K/uL — ABNORMAL HIGH (ref 0.0–0.5)
Eosinophils Relative: 20 %
HCT: 45.9 % (ref 36.0–46.0)
Hemoglobin: 15.4 g/dL — ABNORMAL HIGH (ref 12.0–15.0)
Immature Granulocytes: 0 %
Lymphocytes Relative: 7 %
Lymphs Abs: 1.8 K/uL (ref 0.7–4.0)
MCH: 30.3 pg (ref 26.0–34.0)
MCHC: 33.6 g/dL (ref 30.0–36.0)
MCV: 90.4 fL (ref 80.0–100.0)
Monocytes Absolute: 1.8 K/uL — ABNORMAL HIGH (ref 0.1–1.0)
Monocytes Relative: 7 %
Neutro Abs: 17.7 K/uL — ABNORMAL HIGH (ref 1.7–7.7)
Neutrophils Relative %: 66 %
Platelets: 475 K/uL — ABNORMAL HIGH (ref 150–400)
RBC: 5.08 MIL/uL (ref 3.87–5.11)
RDW: 13.5 % (ref 11.5–15.5)
Smear Review: NORMAL
WBC: 26.7 K/uL — ABNORMAL HIGH (ref 4.0–10.5)
nRBC: 0 % (ref 0.0–0.2)

## 2024-05-08 LAB — COMPREHENSIVE METABOLIC PANEL WITH GFR
ALT: 19 U/L (ref 0–44)
AST: 25 U/L (ref 15–41)
Albumin: 4.4 g/dL (ref 3.5–5.0)
Alkaline Phosphatase: 81 U/L (ref 38–126)
Anion gap: 20 — ABNORMAL HIGH (ref 5–15)
BUN: 23 mg/dL (ref 8–23)
CO2: 26 mmol/L (ref 22–32)
Calcium: 11.2 mg/dL — ABNORMAL HIGH (ref 8.9–10.3)
Chloride: 95 mmol/L — ABNORMAL LOW (ref 98–111)
Creatinine, Ser: 1.1 mg/dL — ABNORMAL HIGH (ref 0.44–1.00)
GFR, Estimated: 50 mL/min — ABNORMAL LOW
Glucose, Bld: 188 mg/dL — ABNORMAL HIGH (ref 70–99)
Potassium: 3.8 mmol/L (ref 3.5–5.1)
Sodium: 140 mmol/L (ref 135–145)
Total Bilirubin: 0.6 mg/dL (ref 0.0–1.2)
Total Protein: 7.5 g/dL (ref 6.5–8.1)

## 2024-05-08 LAB — TYPE AND SCREEN
ABO/RH(D): A POS
Antibody Screen: NEGATIVE

## 2024-05-08 LAB — ABO/RH: ABO/RH(D): A POS

## 2024-05-08 SURGERY — HERNIORRHAPHY, INGUINAL, ROBOT-ASSISTED, LAPAROSCOPIC
Anesthesia: General | Site: Abdomen | Laterality: Right

## 2024-05-08 MED ORDER — SUCCINYLCHOLINE CHLORIDE 200 MG/10ML IV SOSY
PREFILLED_SYRINGE | INTRAVENOUS | Status: DC | PRN
  Administered 2024-05-08: 80 mg via INTRAVENOUS

## 2024-05-08 MED ORDER — IOHEXOL 350 MG/ML SOLN: 75.0000 mL | Freq: Once | INTRAVENOUS | Status: DC | PRN

## 2024-05-08 MED ORDER — LIDOCAINE HCL (PF) 2 % IJ SOLN
INTRAMUSCULAR | Status: AC
  Filled 2024-05-08: qty 5

## 2024-05-08 MED ORDER — CEFAZOLIN SODIUM-DEXTROSE 2-4 GM/100ML-% IV SOLN
2.0000 g | Freq: Once | INTRAVENOUS | Status: AC
  Administered 2024-05-08: 2 g via INTRAVENOUS

## 2024-05-08 MED ORDER — ROCURONIUM BROMIDE 100 MG/10ML IV SOLN
INTRAVENOUS | Status: DC | PRN
  Administered 2024-05-08: 40 mg via INTRAVENOUS

## 2024-05-08 MED ORDER — ALBUMIN HUMAN 5 % IV SOLN
INTRAVENOUS | Status: AC
  Filled 2024-05-08: qty 500

## 2024-05-08 MED ORDER — ONDANSETRON HCL 4 MG/2ML IJ SOLN
4.0000 mg | Freq: Once | INTRAMUSCULAR | Status: AC
  Administered 2024-05-08: 4 mg via INTRAVENOUS
  Filled 2024-05-08: qty 2

## 2024-05-08 MED ORDER — 0.9 % SODIUM CHLORIDE (POUR BTL) OPTIME
TOPICAL | Status: DC | PRN
  Administered 2024-05-08: 500 mL

## 2024-05-08 MED ORDER — ROCURONIUM BROMIDE 10 MG/ML (PF) SYRINGE
PREFILLED_SYRINGE | INTRAVENOUS | Status: AC
  Filled 2024-05-08: qty 10

## 2024-05-08 MED ORDER — LIDOCAINE HCL (CARDIAC) PF 100 MG/5ML IV SOSY
PREFILLED_SYRINGE | INTRAVENOUS | Status: DC | PRN
  Administered 2024-05-08: 70 mg via INTRAVENOUS

## 2024-05-08 MED ORDER — SODIUM CHLORIDE 0.9 % IV BOLUS
500.0000 mL | Freq: Once | INTRAVENOUS | Status: AC
  Administered 2024-05-08: 500 mL via INTRAVENOUS

## 2024-05-08 MED ORDER — PHENYLEPHRINE 80 MCG/ML (10ML) SYRINGE FOR IV PUSH (FOR BLOOD PRESSURE SUPPORT)
PREFILLED_SYRINGE | INTRAVENOUS | Status: AC
  Filled 2024-05-08: qty 10

## 2024-05-08 MED ORDER — VANCOMYCIN HCL IN DEXTROSE 1-5 GM/200ML-% IV SOLN: 1000.0000 mg | Freq: Once | INTRAVENOUS | Status: DC

## 2024-05-08 MED ORDER — ALBUMIN HUMAN 5 % IV SOLN: INTRAVENOUS | Status: DC | PRN

## 2024-05-08 MED ORDER — BUPIVACAINE-EPINEPHRINE 0.5% -1:200000 IJ SOLN
INTRAMUSCULAR | Status: DC | PRN
  Administered 2024-05-08 – 2024-05-09 (×2): 15 mL

## 2024-05-08 MED ORDER — PROPOFOL 10 MG/ML IV BOLUS
INTRAVENOUS | Status: AC
  Filled 2024-05-08: qty 20

## 2024-05-08 MED ORDER — FENTANYL CITRATE (PF) 100 MCG/2ML IJ SOLN
INTRAMUSCULAR | Status: DC | PRN
  Administered 2024-05-08: 25 ug via INTRAVENOUS
  Administered 2024-05-09: 50 ug via INTRAVENOUS
  Administered 2024-05-09: 25 ug via INTRAVENOUS

## 2024-05-08 MED ORDER — PHENYLEPHRINE 80 MCG/ML (10ML) SYRINGE FOR IV PUSH (FOR BLOOD PRESSURE SUPPORT)
PREFILLED_SYRINGE | INTRAVENOUS | Status: DC | PRN
  Administered 2024-05-08: 80 ug via INTRAVENOUS
  Administered 2024-05-08 (×2): 160 ug via INTRAVENOUS
  Administered 2024-05-09: 80 ug via INTRAVENOUS

## 2024-05-08 MED ORDER — IOHEXOL 300 MG/ML  SOLN
100.0000 mL | Freq: Once | INTRAMUSCULAR | Status: AC | PRN
  Administered 2024-05-08: 100 mL via INTRAVENOUS

## 2024-05-08 MED ORDER — PROPOFOL 10 MG/ML IV BOLUS
INTRAVENOUS | Status: DC | PRN
  Administered 2024-05-08: 120 mg via INTRAVENOUS

## 2024-05-08 MED ORDER — LEVOFLOXACIN IN D5W 750 MG/150ML IV SOLN
750.0000 mg | Freq: Once | INTRAVENOUS | Status: AC
  Administered 2024-05-08: 750 mg via INTRAVENOUS
  Filled 2024-05-08: qty 150

## 2024-05-08 MED ORDER — SEVOFLURANE IN SOLN
RESPIRATORY_TRACT | Status: AC
  Filled 2024-05-08: qty 250

## 2024-05-08 MED ORDER — VANCOMYCIN HCL IN DEXTROSE 1-5 GM/200ML-% IV SOLN
INTRAVENOUS | Status: AC
  Filled 2024-05-08: qty 200

## 2024-05-08 MED ORDER — METRONIDAZOLE 500 MG/100ML IV SOLN
500.0000 mg | Freq: Once | INTRAVENOUS | Status: AC
  Administered 2024-05-08: 500 mg via INTRAVENOUS
  Filled 2024-05-08: qty 100

## 2024-05-08 MED ORDER — PANTOPRAZOLE SODIUM 40 MG IV SOLR
40.0000 mg | Freq: Once | INTRAVENOUS | Status: AC
  Administered 2024-05-08: 40 mg via INTRAVENOUS
  Filled 2024-05-08: qty 10

## 2024-05-08 MED ORDER — SODIUM CHLORIDE 0.9% IV SOLUTION
Freq: Once | INTRAVENOUS | Status: DC
  Filled 2024-05-08: qty 250

## 2024-05-08 MED ORDER — SODIUM CHLORIDE 0.9 % IV SOLN
2.0000 g | Freq: Once | INTRAVENOUS | Status: AC
  Administered 2024-05-08: 2 g via INTRAVENOUS
  Filled 2024-05-08: qty 10

## 2024-05-08 MED ORDER — HYDROMORPHONE HCL 1 MG/ML IJ SOLN
0.5000 mg | Freq: Once | INTRAMUSCULAR | Status: AC
  Administered 2024-05-08: 0.5 mg via INTRAVENOUS
  Filled 2024-05-08: qty 0.5

## 2024-05-08 MED ORDER — BUPIVACAINE-EPINEPHRINE (PF) 0.5% -1:200000 IJ SOLN
INTRAMUSCULAR | Status: AC
  Filled 2024-05-08: qty 30

## 2024-05-08 MED ORDER — FENTANYL CITRATE (PF) 100 MCG/2ML IJ SOLN
INTRAMUSCULAR | Status: AC
  Filled 2024-05-08: qty 2

## 2024-05-08 SURGICAL SUPPLY — 49 items
BAG PRESSURE INF REUSE 1000 (BAG) IMPLANT
BNDG GAUZE DERMACEA FLUFF 4 (GAUZE/BANDAGES/DRESSINGS) ×2 IMPLANT
COVER TIP SHEARS 8 DVNC (MISCELLANEOUS) ×2 IMPLANT
COVER WAND RF STERILE (DRAPES) ×2 IMPLANT
DEFOGGER SCOPE WARM SEASHARP (MISCELLANEOUS) ×2 IMPLANT
DERMABOND ADVANCED .7 DNX12 (GAUZE/BANDAGES/DRESSINGS) ×2 IMPLANT
DRAPE ARM DVNC X/XI (DISPOSABLE) ×6 IMPLANT
DRAPE COLUMN DVNC XI (DISPOSABLE) ×2 IMPLANT
DRSG OPSITE POSTOP 3X4 (GAUZE/BANDAGES/DRESSINGS) IMPLANT
DRSG OPSITE POSTOP 4X6 (GAUZE/BANDAGES/DRESSINGS) IMPLANT
ELECTRODE REM PT RTRN 9FT ADLT (ELECTROSURGICAL) ×2 IMPLANT
FORCEPS BPLR FENES DVNC XI (FORCEP) ×2 IMPLANT
GLOVE BIOGEL PI IND STRL 7.0 (GLOVE) ×4 IMPLANT
GLOVE SURG SYN 6.5 PF PI (GLOVE) ×8 IMPLANT
GOWN STRL REUS W/ TWL LRG LVL3 (GOWN DISPOSABLE) ×8 IMPLANT
GRASPER TIP-UP FEN DVNC XI (INSTRUMENTS) IMPLANT
IRRIGATOR SUCT 8 DISP DVNC XI (IRRIGATION / IRRIGATOR) IMPLANT
IV 0.9% NACL 1000 ML (IV SOLUTION) IMPLANT
LABEL OR SOLS (LABEL) IMPLANT
LIGASURE IMPACT 36 18CM CVD LR (INSTRUMENTS) IMPLANT
MANIFOLD NEPTUNE II (INSTRUMENTS) IMPLANT
MESH 3DMAX MID 4X6 LT LRG (Mesh General) IMPLANT
MESH 3DMAX MID 4X6 RT LRG (Mesh General) IMPLANT
NDL DRIVE SUT CUT DVNC (INSTRUMENTS) ×2 IMPLANT
NDL HYPO 22X1.5 SAFETY MO (MISCELLANEOUS) ×2 IMPLANT
NDL INSUFFLATION 14GA 120MM (NEEDLE) ×2 IMPLANT
NEEDLE DRIVE SUT CUT DVNC (INSTRUMENTS) ×2 IMPLANT
NEEDLE HYPO 22X1.5 SAFETY MO (MISCELLANEOUS) ×2 IMPLANT
NEEDLE INSUFFLATION 14GA 120MM (NEEDLE) ×2 IMPLANT
OBTURATOR OPTICALSTD 8 DVNC (TROCAR) ×2 IMPLANT
PACK LAP CHOLECYSTECTOMY (MISCELLANEOUS) ×2 IMPLANT
RELOAD STAPLE 75 3.8 BLU REG (ENDOMECHANICALS) IMPLANT
RETRACTOR WOUND ALXS 18CM SML (MISCELLANEOUS) IMPLANT
SCISSORS MNPLR CVD DVNC XI (INSTRUMENTS) ×2 IMPLANT
SEAL UNIV 5-12 XI (MISCELLANEOUS) ×6 IMPLANT
SET TUBE SMOKE EVAC HIGH FLOW (TUBING) ×2 IMPLANT
SOLN STERILE WATER 500 ML (IV SOLUTION) ×2 IMPLANT
SOLUTION ELECTROSURG ANTI STCK (MISCELLANEOUS) ×2 IMPLANT
STAPLER GUN LINEAR PROX 60 (STAPLE) IMPLANT
STAPLER PROXIMATE 55 BLUE (STAPLE) IMPLANT
STAPLER PROXIMATE 75MM BLUE (STAPLE) IMPLANT
STAPLER SKIN PROX 35W (STAPLE) IMPLANT
SUT PDS PLUS AB 0 CT-2 (SUTURE) IMPLANT
SUT SILK 3 0 SH 30 (SUTURE) IMPLANT
SUT STRATA 3-0 15 RB-1.5 (SUTURE) ×2 IMPLANT
SUT VIC AB 2-0 SH 27XBRD (SUTURE) ×2 IMPLANT
SUT VIC AB 3-0 SH 27X BRD (SUTURE) IMPLANT
SUTURE MNCRL 4-0 27XMF (SUTURE) ×2 IMPLANT
SYR 30ML LL (SYRINGE) ×2 IMPLANT

## 2024-05-08 NOTE — ED Provider Notes (Signed)
 "  [facility] Provider Note    Event Date/Time   First MD Initiated Contact with Patient 05/08/24 1729     (approximate)   History   Abdominal Pain   HPI  Tina Hardy is a 83 y.o. female with history of hypertension, heart failure with reduced EF who comes in with concerns for abdominal pain.  Patient reports a couple days of vomiting.  Only reports 1 episode of diarrhea.  Patient does report having a bulge to her right groin that is been there for the past couple of days that she did not have previously.  She denies any new chest pain, shortness of breath or other concerns.   Physical Exam   Triage Vital Signs: ED Triage Vitals [05/08/24 1700]  Encounter Vitals Group     BP 131/76     Girls Systolic BP Percentile      Girls Diastolic BP Percentile      Boys Systolic BP Percentile      Boys Diastolic BP Percentile      Pulse Rate (!) 113     Resp (!) 22     Temp (!) 97.5 F (36.4 C)     Temp Source Oral     SpO2 95 %     Weight 143 lb 4.8 oz (65 kg)     Height 5' 5 (1.651 m)     Head Circumference      Peak Flow      Pain Score 10     Pain Loc      Pain Education      Exclude from Growth Chart     Most recent vital signs: Vitals:   05/08/24 1700  BP: 131/76  Pulse: (!) 113  Resp: (!) 22  Temp: (!) 97.5 F (36.4 C)  SpO2: 95%     General: Awake, no distress.  CV:  Good peripheral perfusion.  Resp:  Normal effort.  Abd:  No distention.  Tenderness with bulge noted in the right lower abdomen. Other:   No skin changes over the area  ED Results / Procedures / Treatments   Labs (all labs ordered are listed, but only abnormal results are displayed) Labs Reviewed  COMPREHENSIVE METABOLIC PANEL WITH GFR - Abnormal; Notable for the following components:      Result Value   Chloride 95 (*)    Glucose, Bld 188 (*)    Creatinine, Ser 1.10 (*)    Calcium  11.2 (*)    GFR, Estimated 50 (*)    Anion gap 20 (*)    All other  components within normal limits  LACTIC ACID, PLASMA - Abnormal; Notable for the following components:   Lactic Acid, Venous 2.4 (*)    All other components within normal limits  CBC WITH DIFFERENTIAL/PLATELET - Abnormal; Notable for the following components:   WBC 26.7 (*)    Hemoglobin 15.4 (*)    Platelets 475 (*)    Neutro Abs 17.7 (*)    Monocytes Absolute 1.8 (*)    Eosinophils Absolute 5.2 (*)    Abs Immature Granulocytes 0.08 (*)    All other components within normal limits  CULTURE, BLOOD (ROUTINE X 2)  CULTURE, BLOOD (ROUTINE X 2)  PROTIME-INR  LACTIC ACID, PLASMA  URINALYSIS, W/ REFLEX TO CULTURE (INFECTION SUSPECTED)     EKG  My interpretation of EKG:  Normal sinus rate of 84 with little bit of ST elevation in V2 and V3 with normal intervals.  But does  have a left bundle branch block and reviewed prior EKGs and it looks similar.  RADIOLOGY I have reviewed the ct personally and interpreted concern for obstruction   PROCEDURES:  Critical Care performed: Yes, see critical care procedure note(s)  .1-3 Lead EKG Interpretation  Performed by: Ernest Ronal BRAVO, MD Authorized by: Ernest Ronal BRAVO, MD     Interpretation: normal     ECG rate:  80   ECG rate assessment: normal     Rhythm: sinus rhythm     Ectopy: none     Conduction: normal   .Critical Care  Performed by: Ernest Ronal BRAVO, MD Authorized by: Ernest Ronal BRAVO, MD   Critical care provider statement:    Critical care time (minutes):  30   Critical care was necessary to treat or prevent imminent or life-threatening deterioration of the following conditions:  Sepsis   Critical care was time spent personally by me on the following activities:  Development of treatment plan with patient or surrogate, discussions with consultants, evaluation of patient's response to treatment, examination of patient, ordering and review of laboratory studies, ordering and review of radiographic studies, ordering and performing  treatments and interventions, pulse oximetry, re-evaluation of patient's condition and review of old charts    MEDICATIONS ORDERED IN ED: Medications  iohexol  (OMNIPAQUE ) 350 MG/ML injection 75 mL (has no administration in time range)  levofloxacin  (LEVAQUIN ) IVPB 750 mg (750 mg Intravenous New Bag/Given 05/08/24 2005)  ondansetron  (ZOFRAN ) injection 4 mg (4 mg Intravenous Given 05/08/24 1705)  sodium chloride  0.9 % bolus 500 mL (500 mLs Intravenous New Bag/Given 05/08/24 1828)  HYDROmorphone  (DILAUDID ) injection 0.5 mg (0.5 mg Intravenous Given 05/08/24 1827)  ondansetron  (ZOFRAN ) injection 4 mg (4 mg Intravenous Given 05/08/24 1825)  metroNIDAZOLE  (FLAGYL ) IVPB 500 mg (0 mg Intravenous Stopped 05/08/24 1930)  iohexol  (OMNIPAQUE ) 300 MG/ML solution 100 mL (100 mLs Intravenous Contrast Given 05/08/24 1847)     IMPRESSION / MDM / ASSESSMENT AND PLAN / ED COURSE  I reviewed the triage vital signs and the nursing notes.   [name] presentation is most consistent with acute presentation with potential threat to life or bodily function.   Patient has no overlying skin changes.  However exam concerning for obstructed hernia.  Given her elevated white count and elevated lactate I am concerned about potential strangulated bowel.  CT imaging was ordered and Dr. Tye from surgical team was notified over messaging that I would let him know when CT was resulted.  6:20 PM patient with pain with trying to push on the right groin area.  Will try a ice pack and additional IV medications   7:26 PM d/w Dr Tye does not recommend to try to reduce. Will need to be seen and possible OR   IMPRESSION: 1. Small bowel obstruction secondary to incarcerated right inguinal hernia containing distal ileum. There is mesenteric edema and a small amount of free fluid.  [name] heart rates have come down with fluids.  Initially had ordered Levaquin  given unclear allergy to penicillins and had never had any  Keflex or ceftriaxone before.  However after looking at the sepsis order set I am going to broaden her out to Vanco and aztreonam given her significant elevated white count and patient's age.  Her heart rate has come down.  I am hesitant to give full fluid resuscitation at 30 cc/kg given my concerns of her having a history of CHF.  Therefore given her heart rates have come down I will order an  additional 500 cc of fluid for a total of 1 L.  I discussed the case with Dr. Tye who is currently in the OR with another emergent surgery he did not recommend that I try to reduce it and would see her next.  Her repeat lactate is downtrending which is reassuring.  9:17 PM heart rate and lactate have normalized.  The patient is on the cardiac monitor to evaluate for evidence of arrhythmia and/or significant heart rate changes.      FINAL CLINICAL IMPRESSION(S) / ED DIAGNOSES   Final diagnoses:  Sepsis, due to unspecified organism, unspecified whether acute organ dysfunction present [facility])  Incarcerated right inguinal hernia     Rx / DC Orders   ED Discharge Orders     None        Note:  This document was prepared using Dragon voice recognition software and may include unintentional dictation errors.   Ernest Ronal BRAVO, MD 05/08/24 2118  "
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
## 2024-05-08 NOTE — ED Triage Notes (Signed)
 Patient c/o abdominal pain, diarrhea,  and emesis. Actively vomiting in triage.

## 2024-05-08 NOTE — H&P (Addendum)
 Subjective:   CC: Incarcerated right inguinal hernia  HPI:  Tina Hardy is a 83 y.o. female who is consulted by [facility] for evaluation of above cc.  Symptoms were first noted 2 days ago. Pain is right sharp localized over the right groin where she has noticed the bulge around the same time.  Associated with nausea vomiting, exacerbated by above     Past Medical History:  has a past medical history of Carotid arterial disease, Colon polyp (07/18/2004), Diverticulosis, Hepatic hemangioma (07/16/2012), Hypertension, Liver hemangioma, Pernicious anemia, and Rectal bleed.  Past Surgical History:  has a past surgical history that includes Appendectomy (2012); Umbilical hernia repair (12/24/06); Knee surgery; Inguinal hernia repair (12/24/06); Cholecystectomy (05/30/2012); Dental surgery; Hernia repair; Appendectomy; Mandibular hardware removal (N/A, 07/04/2017); and CAROTID PTA/STENT INTERVENTION (Left, 04/22/2019).  Family History: family history includes Crohn's disease in some other family members; Diabetes in her sister; Heart failure in her mother.  Social History:  reports that she quit smoking about 51 years ago. Her smoking use included cigarettes. She has never used smokeless tobacco. She reports current alcohol use. She reports current drug use. Drug: Marijuana.  Current Medications:  Prior to Admission medications  Medication Sig Start Date End Date Taking? Authorizing Provider  amLODipine  (NORVASC ) 5 MG tablet Take 5 mg by mouth every morning. 04/10/24  Yes [provider]  aspirin  EC 81 MG EC tablet Take 1 tablet (81 mg total) by mouth daily. 04/23/19  Yes Stegmayer, Suzen LABOR, PA-C  busPIRone  (BUSPAR ) 5 MG tablet Take 1 tablet (5 mg total) by mouth 2 (two) times daily. 03/18/24  Yes Cannady, [name] T, NP  citalopram  (CELEXA ) 10 MG tablet Take 1 tablet (10 mg total) by mouth daily. 03/18/24  Yes Cannady, [name] T, NP  clopidogrel  (PLAVIX ) 75 MG tablet TAKE 1 TABLET BY MOUTH ONCE  DAILY AT 6:00AM 03/18/24  Yes Cannady, [name] T, NP  EPINEPHrine  0.3 mg/0.3 mL IJ SOAJ injection Inject 0.3 mg into the muscle as needed for anaphylaxis. 11/15/21  Yes Cannady, [name] T, NP  hydrocortisone  2.5 % cream Apply 1 Application topically 2 (two) times daily. 01/14/24  Yes [provider]  rosuvastatin  (CRESTOR ) 10 MG tablet Take 1 tablet (10 mg total) by mouth daily. 03/18/24  Yes Cannady, [name] T, NP  sacubitril -valsartan  (ENTRESTO ) 49-51 MG Take 1 tablet by mouth 2 (two) times daily. 04/03/24  Yes Cannady, [name] T, NP  clobetasol  ointment (TEMOVATE ) 0.05 % Apply 1 Application topically 2 (two) times daily. Patient not taking: Reported on 05/08/2024 06/18/23   [provider]  hydrOXYzine (ATARAX) 25 MG tablet Take 25 mg by mouth 3 (three) times daily as needed for itching. Patient not taking: Reported on 05/08/2024    [provider]    Allergies:  Allergies as of 05/08/2024 - Review Complete 05/08/2024  Allergen Reaction Noted   Bee venom Anaphylaxis 11/15/2021   Codeine Other (See Comments) 07/02/2017   Penicillins Other (See Comments) 07/02/2017    ROS:  Pertinent positives and negatives noted in HPI   Objective:     BP (!) 111/53   Pulse 83   Temp 97.8 F (36.6 C) (Oral)   Resp 20   Ht 5' 5 (1.651 m)   Wt 65 kg   SpO2 95%   BMI 23.85 kg/m    Constitutional :  alert, cooperative, appears stated age, and no distress  Respiratory:  Clear to auscultation bilaterally  Cardiovascular:  Regular rate and rhythm  Gastrointestinal: {soft, focal tenderness over obvious  incarcerated inguinal possible femoral hernia on the right side.; bowel sounds normal; no masses,  no organomegaly.   Skin: Cool and moist  Psychiatric: Normal affect, non-agitated, not confused       LABS:     Latest Ref Rng & Units 05/08/2024    5:06 PM 03/18/2024    1:48 PM 02/07/2023    9:23 AM  CMP  Glucose 70 - 99 mg/dL 811  84  897   BUN 8 - 23 mg/dL 23  12  14     Creatinine 0.44 - 1.00 mg/dL 8.89  9.35  9.38   Sodium 135 - 145 mmol/L 140  140  138   Potassium 3.5 - 5.1 mmol/L 3.8  4.4  3.8   Chloride 98 - 111 mmol/L 95  103  104   CO2 22 - 32 mmol/L 26  24  25    Calcium  8.9 - 10.3 mg/dL 88.7  9.6  9.2   Total Protein 6.5 - 8.1 g/dL 7.5  6.5    Total Bilirubin 0.0 - 1.2 mg/dL 0.6  <9.7    Alkaline Phos 38 - 126 U/L 81  68    AST 15 - 41 U/L 25  23    ALT 0 - 44 U/L 19  18        Latest Ref Rng & Units 05/08/2024    5:06 PM 03/18/2024    1:48 PM 02/07/2023    9:23 AM  CBC  WBC 4.0 - 10.5 K/uL 26.7  10.6  13.7   Hemoglobin 12.0 - 15.0 g/dL 84.5  86.0  86.3   Hematocrit 36.0 - 46.0 % 45.9  47.3  43.2   Platelets 150 - 400 K/uL 475  481  455      RADS: EXAM: CT ABDOMEN AND PELVIS WITH CONTRAST 05/08/2024 06:52:16 PM   TECHNIQUE: CT of the abdomen and pelvis was performed with the administration of 100 mL of iohexol  (OMNIPAQUE ) 300 MG/ML solution. Multiplanar reformatted images are provided for review. Automated exposure control, iterative reconstruction, and/or weight-based adjustment of the mA/kV was utilized to reduce the radiation dose to as low as reasonably achievable.   COMPARISON: CT abdomen and pelvis 08/23/2021.   CLINICAL HISTORY: Abdominal pain, acute, nonlocalized.   FINDINGS:   LOWER CHEST: No acute abnormality.   LIVER: Standard hypodense lesions throughout the liver measuring up to 13 mm, favored as cysts, unchanged. Heterogeneously enhancing lesion in the posterior right lobe of the liver is compatible with hemangioma measuring 3.3 x 1.9 cm.   GALLBLADDER AND BILE DUCTS: The gallbladder is surgically absent. No biliary ductal dilatation.   SPLEEN: No acute abnormality.   PANCREAS: No acute abnormality.   ADRENAL GLANDS: No acute abnormality.   KIDNEYS, URETERS AND BLADDER: There is some scarring in the superior pole of the left kidney. There are rounded hypodensities in the left kidney which are  too small to characterize, likely cysts. Per consensus, no follow-up is needed for simple Bosniak type 1 and 2 renal cysts, unless the patient has a malignancy history or risk factors. No stones in the kidneys or ureters. No hydronephrosis. No perinephric or periureteral stranding. Urinary bladder is unremarkable.   GI AND BOWEL: There is a right inguinal hernia containing a loop of distal ileum. There is mild fat stranding surrounding the hernia. Proximal to this level, small bowel loops are dilated measuring up to 3.2 cm with air fluid levels compatible with small bowel obstruction. Mesenteric edema is present. There is also a  large air fluid level within the stomach. There is sigmoid colon diverticulosis. The appendix is likely surgically absent. No pneumatosis or free air.   PERITONEUM AND RETROPERITONEUM: There is trace free fluid in the pelvis. No free air.   VASCULATURE: There are atherosclerotic calcifications of the aorta and iliac arteries.   LYMPH NODES: No lymphadenopathy.   REPRODUCTIVE ORGANS: No acute abnormality.   BONES AND SOFT TISSUES: Degenerative changes affect the spine and hips. There is a tiny fat containing umbilical hernia.     IMPRESSION: 1. Small bowel obstruction secondary to incarcerated right inguinal hernia containing distal ileum. There is mesenteric edema and a small amount of free fluid.   Electronically signed by: Greig Pique MD 05/08/2024 07:19 PM EST RP Workstation: [id number]    Assessment:     Incarcerated right inguinal hernia containing bowel, possible femoral hernia.  History of low EF Cardiomyopathy History of coronary artery and carotid stent placement on Plavix   Plan:    Discussed the risk of surgery including recurrence, which can be up to 50% in the case of incisional or complex hernias, possible use of prosthetic materials (mesh) and the increased risk of mesh infxn if used, bleeding, chronic pain, post-op infxn,  post-op SBO or ileus, and possible re-operation to address said risks. The risks of general anesthetic, if used, includes MI, CVA, sudden death or even reaction to anesthetic medications also discussed. Alternatives include continued observation.  Benefits include possible symptom relief, prevention of incarceration, strangulation, enlargement in size over time, and the risk of emergency surgery in the face of strangulation.  Typical post-op recovery time of 3-5 days with 4-6 weeks of activity restrictions were also discussed.  The patient verbalized understanding and all questions were answered to the patient's satisfaction.  Unable to be reduced at bedside.  Due to the obvious obstruction and increased white count, recommended proceeding emergently to the operating room  in hopes of salvaging the incarcerated bowel.  She is on Plavix  which places her at increased bleeding risk but will transfuse platelets emergently in hopes to minimize issues.  Also understands she is at a higher perioperative risk secondary to her cardiac history.  Unfortunately due to concerns of ischemic bowel, we will still need to emergently proceed.  Patient and niece at bedside agreeable with the plan.  Patient specifically has requested full CODE STATUS for now.  labs/images/medications/previous chart entries reviewed personally and relevant changes/updates noted above.  Initial notification at 1924, pt examined 2130 due to emergent procedure performed immediately after the notification.

## 2024-05-08 NOTE — Progress Notes (Signed)
 CODE SEPSIS - PHARMACY COMMUNICATION  **Broad Spectrum Antibiotics should be administered within 1 hour of Sepsis diagnosis**  Time Code Sepsis Called/Page Received: 2114  Antibiotics Ordered: aztreonam, levofloxacin , vancomycin  Time of 1st antibiotic administration: 1834  Additional action taken by pharmacy: -  If necessary, Name of Provider/Nurse Contacted: -    Lum VEAR Mania ,PharmD Clinical Pharmacist  05/08/2024  9:15 PM

## 2024-05-08 NOTE — Anesthesia Procedure Notes (Signed)
 Procedure Name: Intubation Date/Time: 05/08/2024 11:17 PM  Performed by: Jaylene Nest, CRNAPre-anesthesia Checklist: Patient identified, Patient being monitored, Timeout performed, Emergency Drugs available and Suction available Patient Re-evaluated:Patient Re-evaluated prior to induction Oxygen Delivery Method: Circle system utilized Preoxygenation: Pre-oxygenation with 100% oxygen Induction Type: IV induction, Rapid sequence and Cricoid Pressure applied Ventilation: Mask ventilation without difficulty Laryngoscope Size: 3 and McGrath Grade View: Grade I Tube type: Oral Tube size: 7.0 mm Number of attempts: 1 Airway Equipment and Method: Stylet and Video-laryngoscopy Placement Confirmation: ETT inserted through vocal cords under direct vision, positive ETCO2 and breath sounds checked- equal and bilateral Secured at: 20 cm Tube secured with: Tape Dental Injury: Teeth and Oropharynx as per pre-operative assessment

## 2024-05-08 NOTE — Telephone Encounter (Signed)
 FYI Only or Action Required?: FYI only for provider: Office visit advised, refused due to no availability today. Would like to know if she can get medication prescribed for nausea/vomiting.  Patient was last seen in primary care on 04/03/2024 by Valerio [name] DASEN, NP.  Called Nurse Triage reporting Vomiting.  Symptoms began several days ago.  Interventions attempted: Rest, hydration, or home remedies.  Symptoms are: unchanged.  Triage Disposition: See Physician Within 24 Hours  Patient/caregiver understands and will follow disposition?: No, refuses disposition  Reason for Disposition  [1] MILD or MODERATE vomiting AND [2] present > 48 hours (2 days)  (Exception: Mild vomiting with associated diarrhea.)  Answer Assessment - Initial Assessment Questions Patients friend, Mitzie, calls in with patient stating patient has been vomiting over the last 3 days. Patient has not been able to keep any food down and has been able to keep some liquids down. Denies any symptoms of dehydration. Also notes that she has a small knot in the groin area that is tender to touch. She just noticed this knot this morning. No abdominal pain reported. Offered patient appt for Monday, but declines. Is concerned about the weekend and would like to know if PCP can prescribe medication for nausea and vomiting.   1. VOMITING SEVERITY: How many times have you vomited in the past 24 hours?      1-2 times  2. ONSET: When did the vomiting begin?      3 days ago  3. FLUIDS: What fluids or food have you vomited up today? Have you been able to keep any fluids down?     Soup, Gatorade; Has been able to keep some fluids down  4. ABDOMEN PAIN: Are your having any abdomen pain? If Yes : How bad is it and what does it feel like? (e.g., crampy, dull, intermittent, constant)      Denies abdominal pain  5. DIARRHEA: Is there any diarrhea? If Yes, ask: How many times today?      1 occurrence of diarrhea  6.  CONTACTS: Is there anyone else in the family with the same symptoms?      NA  7. CAUSE: What do you think is causing your vomiting?     Unknown  8. HYDRATION STATUS: Any signs of dehydration? (e.g., dry mouth [not only dry lips], too weak to stand) When did you last urinate?     No current signs of dehydration  9. OTHER SYMPTOMS: Do you have any other symptoms? (e.g., fever, headache, vertigo, vomiting blood or coffee grounds, recent head injury)     Denies any other symptoms  10. PREGNANCY: Is there any chance you are pregnant? When was your last menstrual period?       NA  Protocols used: Vomiting-A-AH  Copied from CRM [id number]. Topic: Clinical - Red Word Triage >> May 08, 2024  3:34 PM Leonette SQUIBB wrote: Pt's friend Katheryn called saying patient has been throwing up for three days.  She can't keep anything on her stomach.  She also found knot in her groin area with pain.

## 2024-05-08 NOTE — Progress Notes (Signed)
 Elink monitoring for the code sepsis protocol.

## 2024-05-08 NOTE — ED Notes (Signed)
 2nd set cultures sent.

## 2024-05-08 NOTE — Anesthesia Preprocedure Evaluation (Signed)
 "                                  Anesthesia Evaluation  Patient identified by MRN, date of birth, ID band Patient awake    Reviewed: Allergy & Precautions, NPO status , [name] Chart, lab work & pertinent test results  History of Anesthesia Complications Negative for: history of anesthetic complications  Airway Mallampati: III  TM Distance: <3 FB Neck ROM: full    Dental  (+) Chipped   Pulmonary neg shortness of breath, former smoker   Pulmonary exam normal        Cardiovascular Exercise Tolerance: Good hypertension, + CAD and + Cardiac Stents  Normal cardiovascular exam     Neuro/Psych negative neurological ROS  negative psych ROS   GI/Hepatic negative GI ROS, Neg liver ROS,neg GERD  ,,  Endo/Other  negative endocrine ROS    Renal/GU      Musculoskeletal   Abdominal   Peds  Hematology negative hematology ROS (+)   Anesthesia Other Findings Past Medical History: No date: Carotid arterial disease 07/18/2004: Colon polyp     Comment:  Hyperplastic No date: Diverticulosis 07/16/2012: Hepatic hemangioma No date: Hypertension No date: Liver hemangioma No date: Pernicious anemia No date: Rectal bleed  Past Surgical History: 2012: APPENDECTOMY     Comment:  Dr Lily No date: APPENDECTOMY 04/22/2019: CAROTID PTA/STENT INTERVENTION; Left     Comment:  Procedure: CAROTID PTA/STENT INTERVENTION;  Surgeon:               Jama Cordella MATSU, MD;  Location: [facility];               Service: Cardiovascular;  Laterality: Left; 05/30/2012: CHOLECYSTECTOMY     Comment:  Procedure: LAPAROSCOPIC CHOLECYSTECTOMY WITH               INTRAOPERATIVE CHOLANGIOGRAM;  Surgeon: Camellia CHRISTELLA Blush,               MD,FACS;  Location: [facility] OR;  Service: General;                Laterality: N/A; No date: DENTAL SURGERY No date: HERNIA REPAIR 12/24/06: INGUINAL HERNIA REPAIR     Comment:  left; laparoscopic No date: KNEE SURGERY     Comment:   right 07/04/2017: MANDIBULAR HARDWARE REMOVAL; N/A     Comment:  Procedure: REMOVAL OF IMPLANT MANDIBLE;  Surgeon: Joanette Soulier, DMD;  Location: [facility] OR;  Service: Oral Surgery;                Laterality: N/A; 12/24/06: UMBILICAL HERNIA REPAIR     Comment:  with reduction of sigmoid colon which was incarcerated  BMI    Body Mass Index: 23.85 kg/m      Reproductive/Obstetrics negative OB ROS                              Anesthesia Physical Anesthesia Plan  ASA: 3  Anesthesia Plan: General ETT   Post-op Pain Management:    Induction: Intravenous  PONV Risk Score and Plan: Ondansetron , Dexamethasone , Midazolam  and Treatment may vary due to age or medical condition  Airway Management Planned: Oral ETT  Additional Equipment:   Intra-op Plan:   Post-operative Plan: Extubation in OR and Possible Post-op intubation/ventilation  Informed Consent: I have reviewed the patients History and Physical, chart, labs and discussed the procedure including the risks, benefits and alternatives for the proposed anesthesia with the patient or authorized representative who has indicated his/her understanding and acceptance.     Dental Advisory Given  Plan Discussed with: Anesthesiologist, CRNA and Surgeon  Anesthesia Plan Comments: (Patient consented for risks of anesthesia including but not limited to:  - adverse reactions to medications - damage to eyes, teeth, lips or other oral mucosa - nerve damage due to positioning  - sore throat or hoarseness - Damage to heart, brain, nerves, lungs, other parts of body or loss of life  Patient voiced understanding and assent.)        Anesthesia Quick Evaluation  "

## 2024-05-08 NOTE — ED Notes (Signed)
 Pt to ED for 3 days of generalized aching abdominal pain and vomiting. Pt is alert and oriented. Lab called to report initial lactic of 2.4. EDP informed face to face.

## 2024-05-08 NOTE — ED Notes (Signed)
 Dr. Ernest at bedside updating patient

## 2024-05-08 NOTE — Progress Notes (Signed)
 Call received from PACU RN, notified Pt diverted to PACU/OR for pending procedure. Pt will return to 1C post procedure.

## 2024-05-09 DIAGNOSIS — I5022 Chronic systolic (congestive) heart failure: Secondary | ICD-10-CM | POA: Diagnosis not present

## 2024-05-09 DIAGNOSIS — Z9861 Coronary angioplasty status: Secondary | ICD-10-CM

## 2024-05-09 DIAGNOSIS — R011 Cardiac murmur, unspecified: Secondary | ICD-10-CM | POA: Diagnosis not present

## 2024-05-09 DIAGNOSIS — E876 Hypokalemia: Secondary | ICD-10-CM | POA: Diagnosis not present

## 2024-05-09 DIAGNOSIS — I10 Essential (primary) hypertension: Secondary | ICD-10-CM | POA: Diagnosis not present

## 2024-05-09 DIAGNOSIS — Z95828 Presence of other vascular implants and grafts: Secondary | ICD-10-CM | POA: Diagnosis not present

## 2024-05-09 DIAGNOSIS — I2510 Atherosclerotic heart disease of native coronary artery without angina pectoris: Secondary | ICD-10-CM | POA: Diagnosis not present

## 2024-05-09 DIAGNOSIS — K4030 Unilateral inguinal hernia, with obstruction, without gangrene, not specified as recurrent: Secondary | ICD-10-CM | POA: Diagnosis not present

## 2024-05-09 DIAGNOSIS — Z8673 Personal history of transient ischemic attack (TIA), and cerebral infarction without residual deficits: Secondary | ICD-10-CM

## 2024-05-09 LAB — CBC
HCT: 37.4 % (ref 36.0–46.0)
Hemoglobin: 12 g/dL (ref 12.0–15.0)
MCH: 30.1 pg (ref 26.0–34.0)
MCHC: 32.1 g/dL (ref 30.0–36.0)
MCV: 93.7 fL (ref 80.0–100.0)
Platelets: 371 K/uL (ref 150–400)
RBC: 3.99 MIL/uL (ref 3.87–5.11)
RDW: 13.6 % (ref 11.5–15.5)
WBC: 23.7 K/uL — ABNORMAL HIGH (ref 4.0–10.5)
nRBC: 0 % (ref 0.0–0.2)

## 2024-05-09 LAB — BASIC METABOLIC PANEL WITH GFR
Anion gap: 10 (ref 5–15)
BUN: 18 mg/dL (ref 8–23)
CO2: 29 mmol/L (ref 22–32)
Calcium: 9.5 mg/dL (ref 8.9–10.3)
Chloride: 98 mmol/L (ref 98–111)
Creatinine, Ser: 0.68 mg/dL (ref 0.44–1.00)
GFR, Estimated: >60 mL/min
Glucose, Bld: 142 mg/dL — ABNORMAL HIGH (ref 70–99)
Potassium: 3.2 mmol/L — ABNORMAL LOW (ref 3.5–5.1)
Sodium: 137 mmol/L (ref 135–145)

## 2024-05-09 LAB — BPAM PLATELET PHERESIS
Blood Product Expiration Date: 202512212359
ISSUE DATE / TIME: 202512192254
Unit Type and Rh: 7300

## 2024-05-09 LAB — MAGNESIUM: Magnesium: 1.7 mg/dL (ref 1.7–2.4)

## 2024-05-09 LAB — PRO BRAIN NATRIURETIC PEPTIDE: Pro Brain Natriuretic Peptide: 475 pg/mL — ABNORMAL HIGH

## 2024-05-09 LAB — PREPARE PLATELET PHERESIS: Unit division: 0

## 2024-05-09 LAB — TROPONIN T, HIGH SENSITIVITY: Troponin T High Sensitivity: 17 ng/L (ref 0–19)

## 2024-05-09 MED ORDER — MORPHINE SULFATE (PF) 2 MG/ML IV SOLN
2.0000 mg | INTRAVENOUS | Status: DC | PRN
  Administered 2024-05-09 – 2024-05-11 (×4): 2 mg via INTRAVENOUS
  Filled 2024-05-09 (×4): qty 1

## 2024-05-09 MED ORDER — DOCUSATE SODIUM 100 MG PO CAPS: 100.0000 mg | ORAL_CAPSULE | Freq: Two times a day (BID) | ORAL | Status: DC | PRN

## 2024-05-09 MED ORDER — FENTANYL CITRATE (PF) 100 MCG/2ML IJ SOLN
INTRAMUSCULAR | Status: AC
  Filled 2024-05-09: qty 2

## 2024-05-09 MED ORDER — HYDROCODONE-ACETAMINOPHEN 5-325 MG PO TABS
1.0000 | ORAL_TABLET | Freq: Three times a day (TID) | ORAL | Status: DC | PRN
  Administered 2024-05-09 – 2024-05-11 (×4): 1 via ORAL
  Filled 2024-05-09 (×4): qty 1

## 2024-05-09 MED ORDER — POTASSIUM CHLORIDE 10 MEQ/100ML IV SOLN
10.0000 meq | INTRAVENOUS | Status: AC
  Administered 2024-05-09 (×4): 10 meq via INTRAVENOUS
  Filled 2024-05-09 (×4): qty 100

## 2024-05-09 MED ORDER — MAGNESIUM SULFATE 2 GM/50ML IV SOLN
2.0000 g | Freq: Once | INTRAVENOUS | Status: AC
  Administered 2024-05-09: 2 g via INTRAVENOUS
  Filled 2024-05-09: qty 50

## 2024-05-09 MED ORDER — CEFAZOLIN SODIUM 1 G IJ SOLR
INTRAMUSCULAR | Status: AC
  Filled 2024-05-09: qty 20

## 2024-05-09 MED ORDER — SUGAMMADEX SODIUM 200 MG/2ML IV SOLN
INTRAVENOUS | Status: DC | PRN
  Administered 2024-05-09: 200 mg via INTRAVENOUS

## 2024-05-09 MED ORDER — OXYCODONE HCL 5 MG/5ML PO SOLN: 5.0000 mg | Freq: Once | ORAL | Status: AC | PRN

## 2024-05-09 MED ORDER — ONDANSETRON HCL 4 MG/2ML IJ SOLN
4.0000 mg | Freq: Four times a day (QID) | INTRAMUSCULAR | Status: DC | PRN
  Administered 2024-05-12: 4 mg via INTRAVENOUS
  Filled 2024-05-09: qty 2

## 2024-05-09 MED ORDER — LACTATED RINGERS IV SOLN: INTRAVENOUS | Status: DC | PRN

## 2024-05-09 MED ORDER — DEXAMETHASONE SOD PHOSPHATE PF 10 MG/ML IJ SOLN
INTRAMUSCULAR | Status: DC | PRN
  Administered 2024-05-09: 10 mg via INTRAVENOUS

## 2024-05-09 MED ORDER — ONDANSETRON HCL 4 MG/2ML IJ SOLN
INTRAMUSCULAR | Status: AC
  Filled 2024-05-09: qty 2

## 2024-05-09 MED ORDER — MENTHOL 3 MG MT LOZG
1.0000 | LOZENGE | OROMUCOSAL | Status: DC | PRN
  Administered 2024-05-09 (×2): 3 mg via ORAL
  Filled 2024-05-09: qty 9

## 2024-05-09 MED ORDER — FENTANYL CITRATE (PF) 100 MCG/2ML IJ SOLN
25.0000 ug | INTRAMUSCULAR | Status: DC | PRN
  Administered 2024-05-09 (×4): 25 ug via INTRAVENOUS

## 2024-05-09 MED ORDER — DEXMEDETOMIDINE HCL IN NACL 80 MCG/20ML IV SOLN
INTRAVENOUS | Status: DC | PRN
  Administered 2024-05-09: 12 ug via INTRAVENOUS

## 2024-05-09 MED ORDER — MELATONIN 5 MG PO TABS: 2.5000 mg | ORAL_TABLET | Freq: Every evening | ORAL | Status: DC | PRN

## 2024-05-09 MED ORDER — OXYCODONE HCL 5 MG PO TABS
ORAL_TABLET | ORAL | Status: AC
  Filled 2024-05-09: qty 1

## 2024-05-09 MED ORDER — LACTATED RINGERS IV SOLN: INTRAVENOUS | Status: DC

## 2024-05-09 MED ORDER — ONDANSETRON HCL 4 MG/2ML IJ SOLN
INTRAMUSCULAR | Status: DC | PRN
  Administered 2024-05-09: 4 mg via INTRAVENOUS

## 2024-05-09 MED ORDER — TRAMADOL HCL 50 MG PO TABS: 50.0000 mg | ORAL_TABLET | Freq: Four times a day (QID) | ORAL | Status: DC | PRN

## 2024-05-09 MED ORDER — ACETAMINOPHEN 325 MG PO TABS: 650.0000 mg | ORAL_TABLET | Freq: Three times a day (TID) | ORAL | Status: DC | PRN

## 2024-05-09 MED ORDER — ONDANSETRON 4 MG PO TBDP: 4.0000 mg | ORAL_TABLET | Freq: Four times a day (QID) | ORAL | Status: DC | PRN

## 2024-05-09 MED ORDER — OXYCODONE HCL 5 MG PO TABS
5.0000 mg | ORAL_TABLET | Freq: Once | ORAL | Status: AC | PRN
  Administered 2024-05-09: 5 mg via ORAL

## 2024-05-09 NOTE — Anesthesia Postprocedure Evaluation (Signed)
"   Anesthesia Post Note  Patient: Tina Hardy  Procedure(s) Performed: HERNIORRHAPHY, INGUINAL, ROBOT-ASSISTED, LAPAROSCOPIC WITH MESH (Right: Abdomen) EXCISION, SMALL INTESTINE (Abdomen)  Patient location during evaluation: PACU Anesthesia Type: General Level of consciousness: awake and alert Pain management: pain level controlled Vital Signs Assessment: post-procedure vital signs reviewed and stable Respiratory status: spontaneous breathing, nonlabored ventilation and respiratory function stable Cardiovascular status: blood pressure returned to baseline and stable Postop Assessment: no apparent nausea or vomiting Anesthetic complications: no   No notable events documented.   Last Vitals:  Vitals:   05/09/24 0235 05/09/24 0530  BP: (!) 125/58 (!) 131/58  Pulse: 91 86  Resp: 19   Temp:  36.9 C  SpO2: 92% 90%    Last Pain:  Vitals:   05/09/24 0511  TempSrc:   PainSc: 0-No pain                 Fairy POUR Itamar Mcgowan      "

## 2024-05-09 NOTE — Progress Notes (Signed)
 Consultation Progress Note   Patient: Tina Hardy FMW:[id number] DOB: [birth year] DOA: 05/08/2024 DOS: the patient was seen and examined on 05/09/2024 Primary service: Tye Millet, DO  Brief hospital course: Partly taken from consult note.   Tina Hardy is a 83 y.o. female with past medical history of HTN, HLD, anxiety and depression TIA s/p carotid artery stent, CAD s/p stent, new diagnosis of cardiomyopathy/HFrEF (EF 35 to 40%, 03/18/24), after PCP discovered a new murmur on routine exam, seen by cardiology 04/29/2024 with plans for stress test being being seen in urgent consultation for medical clearance prior to emergent surgical repair of incarcerated and strangulated femoral hernia given concerns for concomitant CHF exacerbation. She presented to the ED with abdominal pain associated with a couple days of vomiting.  No chest pain, dyspnea, orthopnea or lower extremity edema.  On presentation patient was mildly tachycardic and tachypneic, workup consistent with infection, CT abdomen with concern of incarcerated hernia.  Chest x-ray was without any acute abnormality.  EKG with NSR, LVH and nonspecific intraventricular conduction block.  Patient was admitted under surgical department and TRH was consulted for emergent surgical clearance with history of HFrEF.  12/20: Vital stable, persistent leukocytosis with small improvement now at 23.7, mild hypokalemia 3.2.  Magnesium  at 1.7-replating both potassium and magnesium  troponin negative with mildly elevated BNP at 475. S/p robotic assisted laparoscopic right femoral hernia repair with mesh and small bowel resection. Still with NG tube and no bowel function.  Assessment and Plan: * Incarcerated inguinal hernia Management per surgery S/p robotic assisted right femoral hernia repair and small bowel resection. Still no bowel function and NG tube in place  Chronic HFrEF (heart failure with reduced ejection fraction)  (HCC) Cardiomyopathy pending ischemic evaluation with stress test(EF 35 to 40% 03/18/2024) Systolic murmur Patient denies chest pain or shortness of breath and she is clinically euvolemic With place at low risk for perioperative cardiopulmonary events Clinically appears euvolemic Monitor volume status May need IV lasix daily (currently, only on Entresto , recently started) Daily weights  CAD S/P percutaneous coronary angioplasty History of internal carotid artery stent History of TIA No acute issues suspected Continue aspirin  and rosuvastatin  once cleared for oral  Hypertension Blood pressure currently within goal Holding antihypertensives.  Will order as needed.  Hypokalemia Mild hypokalemia with potassium at 3.2 and hypomagnesemia with magnesium  at 1.7. -Replace potassium and magnesium  -Continue to monitor   TRH will continue to follow the patient.  Subjective: Patient was seen and examined today.  Pain seems controlled.  Has not passed any flatus, no BM.  NG tube with significant discharge.  Physical Exam: Vitals:   05/09/24 0235 05/09/24 0530 05/09/24 0841 05/09/24 1702  BP: (!) 125/58 (!) 131/58 (!) 121/52 (!) 124/51  Pulse: 91 86 85 85  Resp: 19  18 18   Temp:  98.5 F (36.9 C) 98.4 F (36.9 C) 98.7 F (37.1 C)  TempSrc:      SpO2: 92% 90% 90% 90%  Weight:      Height:       General.  Frail elderly lady, in no acute distress.  NG tube in place Pulmonary.  Lungs clear bilaterally, normal respiratory effort. CV.  Regular rate and rhythm, no JVD, rub or murmur. Abdomen.  Soft, nontender, nondistended, BS hypoactive CNS.  Alert and oriented .  No focal neurologic deficit. Extremities.  No edema,  pulses intact and symmetrical. Psychiatry.  Judgment and insight appears normal.   Data Reviewed: Prior data reviewed  Family Communication: Primary team  is communicating with family  Time spent: 50 minutes.  This record has been created using Software engineer. Errors have been sought and corrected,but may not always be located. Such creation errors do not reflect on the standard of care.   Author: Amaryllis Dare, MD 05/09/2024 5:59 PM  For on call review [email or web address].

## 2024-05-09 NOTE — Assessment & Plan Note (Addendum)
 Cardiomyopathy pending ischemic evaluation with stress test(EF 35 to 40% 03/18/2024) Systolic murmur Patient denies chest pain or shortness of breath and she is clinically euvolemic With place at low risk for perioperative cardiopulmonary events Clinically appears euvolemic Monitor volume status May need IV lasix daily (currently, only on Entresto , recently started) Daily weights

## 2024-05-09 NOTE — Op Note (Signed)
 Preoperative diagnosis: Incarcerated, right,  inguinal Hernia, right and small bowel obstruction  Postoperative diagnosis: Strangulated femoral Hernia, right  Procedure: Robotic assisted laparoscopic right femoral hernia repair with mesh, small bowel resection  Anesthesia: General  Surgeon: Dr. Tye  Wound Classification: Clean  Specimen: none  Complications: None  Estimated Blood Loss: 50mL   Indications:  See H&P for further details.  Findings: 1.  Strangulated segment of small bowel within right femoral hernia 2. Bard 3D max medium weight mesh used for r hernia repair  3. Adequate hemostasis achieved  Description of procedure: The patient was taken to the operating room. A time-out was completed verifying correct patient, procedure, site, positioning, and implant(s) and/or special equipment prior to beginning this procedure.  Area was prepped and draped in the usual sterile fashion. An incision was marked 20 cm above the pubic tubercle, slightly above the umbilicus    Veress needle inserted at palmer's point.  Saline drop test noted to be positive with gradual increase in pressure after initiation of gas insufflation.  15 mm of pressure was achieved prior to removing the Veress needle and then placing a 8 mm port via the Optiview technique through the supraumbilical site.  Inspection of the area afterwards noted no injury to the surrounding organs during insertion of the needle and the port.  2 port sites were marked 8 cm to the lateral sides of the initial port, and a 8 mm robotic port was placed on the left side, another 8 mm robotic port on the right side under direct supervision.  Local anesthesia  infused to the preplanned incision sites prior to insertion of the port.  The [employer]ner platform was then brought into the operative field and docked to the ports.  Examination of the abdominal cavity noted a right femoral hernia with small bowel.  Reduction attempted with gentle  traction but unsuccessful.  A peritoneal flap was created approximately 8cm cephalad to the defect by using scissors with electrocautery.  Dissection was carried down towards the pubic tubercle, developing the myopectineal orifice view.  Laterally the flap was carried towards the ASIS.  hernia sac with bowel was noted, which carefully dissected away from the adjacent tissues, eventually to be fully reduced out of hernia cavity along with the bowel contents.  Inspection of the bowel noted necrosis.   Any bleeding was controlled with combination of electrocautery and manual pressure.  Decision made to proceed with bowel resection and anastomosis after completion of hernia repair and adequate coverage with peritoneal lining.  After confirming adequate dissection and the peritoneal reflection completely down and away from the cord structures, a Large Bard 3DMax medium weight mesh was placed within the anterior abdominal wall, secured in place using 2-0 Vicryl on an SH needle immediately above the pubic tubercle.  After noting proper placement of the mesh with the peritoneal reflection deep to it, the previously created peritoneal flap was secured back up to the anterior abdominal wall using running 3-0 V-Lock.  Both needles were then removed out of the abdominal cavity, Xi platform undocked from the ports and removed off of operative field.    Infraumbilical midline incision made and abdominal cavity entered with no injury.  Abdomen then desufflated and ports removed.  Segment of strangulated bowel identified and pulled through the midline incision after placing wound protector.  Side-to-side anastomosis was then created after resecting the necrotic portion using 75 mm blue load stapler x2 along healthy margins and transecting the mesentery using ligature.  Necrotic segment passed off field pending pathology.  A side-to-side anastomosis created by creating an enterotomy and placing a 75 mm blue load stapler  through.  After confirming no bleeding from the staple line enterotomy closed with blue load TA stapler.  Staple line then reinforced with running 3-0 silk in a Lembert fashion.  No bleeding noted at the end of the procedure.    Wound protector removed and midline incision closed with running 1 PDS x 2.  Subdermal layer approximated using 3-0 Vicryl interrupted.  All the skin incisions were then closed with staples.  Areas dressed with honeycomb dressing.  The patient tolerated the procedure well and was taken to the postanesthesia care unit in stable condition. Sponge and instrument count correct at end of procedure.

## 2024-05-09 NOTE — Transfer of Care (Signed)
 Immediate Anesthesia Transfer of Care Note  Patient: Tina Hardy  Procedure(s) Performed: HERNIORRHAPHY, INGUINAL, ROBOT-ASSISTED, LAPAROSCOPIC WITH MESH (Right: Abdomen) EXCISION, SMALL INTESTINE (Abdomen)  Patient Location: PACU  Anesthesia Type:General  Level of Consciousness: drowsy  Airway & Oxygen Therapy: Patient Spontanous Breathing and Patient connected to face mask oxygen  Post-op Assessment: Report given to RN and Post -op Vital signs reviewed and stable  Post vital signs: Reviewed and stable  Last Vitals:  Vitals Value Taken Time  BP    Temp    Pulse    Resp    SpO2      Last Pain:  Vitals:   05/08/24 2300  TempSrc: Temporal  PainSc:          Complications: No notable events documented.

## 2024-05-09 NOTE — Hospital Course (Addendum)
 Partly taken from consult note.   Tina Hardy is a 83 y.o. female with past medical history of HTN, HLD, anxiety and depression TIA s/p carotid artery stent, CAD s/p stent, new diagnosis of cardiomyopathy/HFrEF (EF 35 to 40%, 03/18/24), after PCP discovered a new murmur on routine exam, seen by cardiology 04/29/2024 with plans for stress test being being seen in urgent consultation for medical clearance prior to emergent surgical repair of incarcerated and strangulated femoral hernia given concerns for concomitant CHF exacerbation. She presented to the ED with abdominal pain associated with a couple days of vomiting.  No chest pain, dyspnea, orthopnea or lower extremity edema.  On presentation patient was mildly tachycardic and tachypneic, workup consistent with infection, CT abdomen with concern of incarcerated hernia.  Chest x-ray was without any acute abnormality.  EKG with NSR, LVH and nonspecific intraventricular conduction block.  Patient was admitted under surgical department and TRH was consulted for emergent surgical clearance with history of HFrEF.  12/20: Vital stable, persistent leukocytosis with small improvement now at 23.7, mild hypokalemia 3.2.  Magnesium  at 1.7-replating both potassium and magnesium  troponin negative with mildly elevated BNP at 475. S/p robotic assisted laparoscopic right femoral hernia repair with mesh and small bowel resection. Still with NG tube and no bowel function.  12/21: Vital stable with improving leukocytosis, mild hypophosphatemia which is being repleted.  Dark bilious NG secretions-General Surgery started IV Protonix .  Home aspirin  and Plavix  are being restarted.  Still no bowel function.  12/22: Hemodynamically stable, slowly improving leukocytosis, potassium at 3.4 which is being repleted.  Started passing flatus, no BM yet.  Improving secretions and NG tube.

## 2024-05-09 NOTE — Progress Notes (Signed)
 Subjective:  CC: Tina Hardy is a 83 y.o. female  Hospital stay day 1, 1 Day Post-Op Robotic assisted laparoscopic right femoral hernia repair with mesh, small bowel resection   HPI: No issues overnight.  ROS:  Pertinent positives and negatives noted in the HPI.    Objective:   Temp:  [97.4 F (36.3 C)-98.5 F (36.9 C)] 98.4 F (36.9 C) (12/20 0841) Pulse Rate:  [80-113] 85 (12/20 0841) Resp:  [15-22] 18 (12/20 0841) BP: (99-131)/(49-93) 121/52 (12/20 0841) SpO2:  [90 %-95 %] 90 % (12/20 0841) Weight:  [65 kg] 65 kg (12/19 1700)     Height: 5' 5 (165.1 cm) Weight: 65 kg BMI (Calculated): 23.85   Intake/Output this shift:   Intake/Output Summary (Last 24 hours) at 05/09/2024 1145 Last data filed at 05/09/2024 0235 Gross per 24 hour  Intake 1560 ml  Output 50 ml  Net 1510 ml    Constitutional :  alert, cooperative, appears stated age, and no distress  Respiratory:  Clear to auscultation bilaterally  Cardiovascular:  Regular rate and rhythm  Gastrointestinal: Soft, no guarding, tenderness to palpation along incisions as expected as well as right lower quadrant.   Skin: Cool and moist.  Staples clean dry and intact  Psychiatric: Normal affect, non-agitated, not confused       LABS:     Latest Ref Rng & Units 05/09/2024    5:15 AM 05/08/2024    5:06 PM 03/18/2024    1:48 PM  CMP  Glucose 70 - 99 mg/dL 857  811  84   BUN 8 - 23 mg/dL 18  23  12    Creatinine 0.44 - 1.00 mg/dL 9.31  8.89  9.35   Sodium 135 - 145 mmol/L 137  140  140   Potassium 3.5 - 5.1 mmol/L 3.2  3.8  4.4   Chloride 98 - 111 mmol/L 98  95  103   CO2 22 - 32 mmol/L 29  26  24    Calcium  8.9 - 10.3 mg/dL 9.5  88.7  9.6   Total Protein 6.5 - 8.1 g/dL  7.5  6.5   Total Bilirubin 0.0 - 1.2 mg/dL  0.6  <9.7   Alkaline Phos 38 - 126 U/L  81  68   AST 15 - 41 U/L  25  23   ALT 0 - 44 U/L  19  18       Latest Ref Rng & Units 05/09/2024    5:15 AM 05/08/2024    5:06 PM 03/18/2024    1:48 PM   CBC  WBC 4.0 - 10.5 K/uL 23.7  26.7  10.6   Hemoglobin 12.0 - 15.0 g/dL 87.9  84.5  86.0   Hematocrit 36.0 - 46.0 % 37.4  45.9  47.3   Platelets 150 - 400 K/uL 371  475  481     RADS: N/a Assessment:   S/p Robotic assisted laparoscopic right femoral hernia repair with mesh, small bowel resection.  As expected.  NG with bilious output.  No flatus or BM reported.  Encourage gum and hard candy, ambulation, await return of bowel function.  labs/images/medications/previous chart entries reviewed personally and relevant changes/updates noted above.

## 2024-05-09 NOTE — Plan of Care (Signed)
  Problem: Clinical Measurements: Goal: Ability to maintain clinical measurements within normal limits will improve Outcome: Progressing Goal: Cardiovascular complication will be avoided Outcome: Progressing   Problem: Elimination: Goal: Will not experience complications related to bowel motility Outcome: Progressing   Problem: Safety: Goal: Ability to remain free from injury will improve Outcome: Progressing   

## 2024-05-09 NOTE — Assessment & Plan Note (Signed)
 Mild hypokalemia with potassium at 3.2 and hypomagnesemia with magnesium  at 1.7. -Replace potassium and magnesium  -Continue to monitor

## 2024-05-09 NOTE — Assessment & Plan Note (Signed)
 History of internal carotid artery stent History of TIA No acute issues suspected Continue aspirin  and rosuvastatin  once cleared for oral

## 2024-05-09 NOTE — Assessment & Plan Note (Addendum)
 Management per surgery S/p robotic assisted right femoral hernia repair and small bowel resection. Still no bowel function and NG tube in place

## 2024-05-09 NOTE — Assessment & Plan Note (Addendum)
 Blood pressure currently within goal Holding antihypertensives.  Will order as needed.

## 2024-05-10 DIAGNOSIS — Z8673 Personal history of transient ischemic attack (TIA), and cerebral infarction without residual deficits: Secondary | ICD-10-CM | POA: Diagnosis not present

## 2024-05-10 DIAGNOSIS — K4030 Unilateral inguinal hernia, with obstruction, without gangrene, not specified as recurrent: Secondary | ICD-10-CM | POA: Diagnosis not present

## 2024-05-10 DIAGNOSIS — I5022 Chronic systolic (congestive) heart failure: Secondary | ICD-10-CM | POA: Diagnosis not present

## 2024-05-10 DIAGNOSIS — I2510 Atherosclerotic heart disease of native coronary artery without angina pectoris: Secondary | ICD-10-CM | POA: Diagnosis not present

## 2024-05-10 LAB — CBC
HCT: 35.9 % — ABNORMAL LOW (ref 36.0–46.0)
Hemoglobin: 11.3 g/dL — ABNORMAL LOW (ref 12.0–15.0)
MCH: 30.1 pg (ref 26.0–34.0)
MCHC: 31.5 g/dL (ref 30.0–36.0)
MCV: 95.7 fL (ref 80.0–100.0)
Platelets: 347 K/uL (ref 150–400)
RBC: 3.75 MIL/uL — ABNORMAL LOW (ref 3.87–5.11)
RDW: 14.1 % (ref 11.5–15.5)
WBC: 18.3 K/uL — ABNORMAL HIGH (ref 4.0–10.5)
nRBC: 0 % (ref 0.0–0.2)

## 2024-05-10 LAB — BASIC METABOLIC PANEL WITH GFR
Anion gap: 7 (ref 5–15)
BUN: 21 mg/dL (ref 8–23)
CO2: 30 mmol/L (ref 22–32)
Calcium: 8.6 mg/dL — ABNORMAL LOW (ref 8.9–10.3)
Chloride: 100 mmol/L (ref 98–111)
Creatinine, Ser: 0.54 mg/dL (ref 0.44–1.00)
GFR, Estimated: >60 mL/min
Glucose, Bld: 89 mg/dL (ref 70–99)
Potassium: 3.6 mmol/L (ref 3.5–5.1)
Sodium: 138 mmol/L (ref 135–145)

## 2024-05-10 LAB — GLUCOSE, CAPILLARY
Glucose-Capillary: 92 mg/dL (ref 70–99)
Glucose-Capillary: 94 mg/dL (ref 70–99)

## 2024-05-10 LAB — MAGNESIUM: Magnesium: 2.3 mg/dL (ref 1.7–2.4)

## 2024-05-10 LAB — PHOSPHORUS: Phosphorus: 2 mg/dL — ABNORMAL LOW (ref 2.5–4.6)

## 2024-05-10 MED ORDER — PANTOPRAZOLE SODIUM 40 MG IV SOLR
40.0000 mg | INTRAVENOUS | Status: DC
  Administered 2024-05-10 – 2024-05-13 (×4): 40 mg via INTRAVENOUS
  Filled 2024-05-10 (×4): qty 10

## 2024-05-10 MED ORDER — ASPIRIN 81 MG PO TBEC
81.0000 mg | DELAYED_RELEASE_TABLET | Freq: Every day | ORAL | Status: DC
  Administered 2024-05-10 – 2024-05-13 (×4): 81 mg via ORAL
  Filled 2024-05-10 (×4): qty 1

## 2024-05-10 MED ORDER — CLOPIDOGREL BISULFATE 75 MG PO TABS
75.0000 mg | ORAL_TABLET | Freq: Every day | ORAL | Status: DC
  Administered 2024-05-10 – 2024-05-13 (×4): 75 mg via ORAL
  Filled 2024-05-10 (×4): qty 1

## 2024-05-10 MED ORDER — SODIUM PHOSPHATES 45 MMOLE/15ML IV SOLN
30.0000 mmol | Freq: Once | INTRAVENOUS | Status: AC
  Administered 2024-05-10: 30 mmol via INTRAVENOUS
  Filled 2024-05-10: qty 10

## 2024-05-10 MED ORDER — ENOXAPARIN SODIUM 40 MG/0.4ML IJ SOSY: 40.0000 mg | PREFILLED_SYRINGE | INTRAMUSCULAR | Status: DC

## 2024-05-10 NOTE — Assessment & Plan Note (Signed)
 History of internal carotid artery stent History of TIA No acute issues suspected General Surgery restarted home aspirin  and Plavix  Continue  rosuvastatin  once cleared for oral

## 2024-05-10 NOTE — Assessment & Plan Note (Signed)
 Management per surgery S/p robotic assisted right femoral hernia repair and small bowel resection. Still no bowel function and NG tube in place

## 2024-05-10 NOTE — Assessment & Plan Note (Signed)
 Cardiomyopathy pending ischemic evaluation with stress test(EF 35 to 40% 03/18/2024) Systolic murmur Patient denies chest pain or shortness of breath and she is clinically euvolemic With place at low risk for perioperative cardiopulmonary events Clinically appears euvolemic Monitor volume status Continue home medications Surgery restarted home aspirin  and Plavix  Daily weights

## 2024-05-10 NOTE — Progress Notes (Addendum)
 Subjective:  CC: Tina Hardy is a 83 y.o. female  Hospital stay day 2, 2 Days Post-Op Robotic assisted laparoscopic right femoral hernia repair with mesh, small bowel resection   HPI: No issues overnight.  No flatus or BM.  ROS:  Pertinent positives and negatives noted in the HPI.    Objective:   Temp:  [96.8 F (36 C)-98.7 F (37.1 C)] 98.6 F (37 C) (12/21 0744) Pulse Rate:  [79-87] 86 (12/21 0744) Resp:  [14-18] 14 (12/21 0744) BP: (101-124)/(51-70) 110/70 (12/21 0744) SpO2:  [89 %-96 %] 89 % (12/21 0744)     Height: 5' 5 (165.1 cm) Weight: 65 kg BMI (Calculated): 23.85   Intake/Output this shift:   Intake/Output Summary (Last 24 hours) at 05/10/2024 9062 Last data filed at 05/09/2024 1844 Gross per 24 hour  Intake 430.29 ml  Output 300 ml  Net 130.29 ml    Constitutional :  alert, cooperative, appears stated age, and no distress  Respiratory:  Clear to auscultation bilaterally  Cardiovascular:  Regular rate and rhythm  Gastrointestinal: Soft, no guarding, tenderness to palpation along incisions as expected as well as right lower quadrant.   Skin: Cool and moist.  Staples clean dry and intact  Psychiatric: Normal affect, non-agitated, not confused       LABS:     Latest Ref Rng & Units 05/10/2024    5:25 AM 05/09/2024    5:15 AM 05/08/2024    5:06 PM  CMP  Glucose 70 - 99 mg/dL 89  857  811   BUN 8 - 23 mg/dL 21  18  23    Creatinine 0.44 - 1.00 mg/dL 9.45  9.31  8.89   Sodium 135 - 145 mmol/L 138  137  140   Potassium 3.5 - 5.1 mmol/L 3.6  3.2  3.8   Chloride 98 - 111 mmol/L 100  98  95   CO2 22 - 32 mmol/L 30  29  26    Calcium  8.9 - 10.3 mg/dL 8.6  9.5  88.7   Total Protein 6.5 - 8.1 g/dL   7.5   Total Bilirubin 0.0 - 1.2 mg/dL   0.6   Alkaline Phos 38 - 126 U/L   81   AST 15 - 41 U/L   25   ALT 0 - 44 U/L   19       Latest Ref Rng & Units 05/10/2024    5:25 AM 05/09/2024    5:15 AM 05/08/2024    5:06 PM  CBC  WBC 4.0 - 10.5 K/uL 18.3   23.7  26.7   Hemoglobin 12.0 - 15.0 g/dL 88.6  87.9  84.5   Hematocrit 36.0 - 46.0 % 35.9  37.4  45.9   Platelets 150 - 400 K/uL 347  371  475     RADS: N/a Assessment:   S/p Robotic assisted laparoscopic right femoral hernia repair with mesh, small bowel resection.  As expected.  NG with darker bilious output.  Will start IV Protonix .  Still no flatus or BM reported.  Continue to encourage gum and hard candy, ambulation, await return of bowel function.  Restarting aspirin  Plavix   labs/images/medications/previous chart entries reviewed personally and relevant changes/updates noted above.

## 2024-05-10 NOTE — Progress Notes (Signed)
 Consultation Progress Note   Patient: Tina Hardy FMW:[id number] DOB: 01/14/1941 DOA: 05/08/2024 DOS: the patient was seen and examined on 05/10/2024 Primary service: Tye Millet, DO  Brief hospital course: Partly taken from consult note.   Tina Hardy is a 83 y.o. female with past medical history of HTN, HLD, anxiety and depression TIA s/p carotid artery stent, CAD s/p stent, new diagnosis of cardiomyopathy/HFrEF (EF 35 to 40%, 03/18/24), after PCP discovered a new murmur on routine exam, seen by cardiology 04/29/2024 with plans for stress test being being seen in urgent consultation for medical clearance prior to emergent surgical repair of incarcerated and strangulated femoral hernia given concerns for concomitant CHF exacerbation. She presented to the ED with abdominal pain associated with a couple days of vomiting.  No chest pain, dyspnea, orthopnea or lower extremity edema.  On presentation patient was mildly tachycardic and tachypneic, workup consistent with infection, CT abdomen with concern of incarcerated hernia.  Chest x-ray was without any acute abnormality.  EKG with NSR, LVH and nonspecific intraventricular conduction block.  Patient was admitted under surgical department and TRH was consulted for emergent surgical clearance with history of HFrEF.  12/20: Vital stable, persistent leukocytosis with small improvement now at 23.7, mild hypokalemia 3.2.  Magnesium  at 1.7-replating both potassium and magnesium  troponin negative with mildly elevated BNP at 475. S/p robotic assisted laparoscopic right femoral hernia repair with mesh and small bowel resection. Still with NG tube and no bowel function.  12/21: Vital stable with improving leukocytosis, mild hypophosphatemia which is being repleted.  Dark bilious NG secretions-General Surgery started IV Protonix .  Home aspirin  and Plavix  are being restarted.  Still no bowel function.  Assessment and Plan: * Incarcerated inguinal  hernia Management per surgery S/p robotic assisted right femoral hernia repair and small bowel resection. Still no bowel function and NG tube in place  Chronic HFrEF (heart failure with reduced ejection fraction) (HCC) Cardiomyopathy pending ischemic evaluation with stress test(EF 35 to 40% 03/18/2024) Systolic murmur Patient denies chest pain or shortness of breath and she is clinically euvolemic With place at low risk for perioperative cardiopulmonary events Clinically appears euvolemic Monitor volume status Continue home medications Surgery restarted home aspirin  and Plavix  Daily weights  CAD S/P percutaneous coronary angioplasty History of internal carotid artery stent History of TIA No acute issues suspected General Surgery restarted home aspirin  and Plavix  Continue  rosuvastatin  once cleared for oral  Hypertension Blood pressure currently within goal Holding antihypertensives.  Will order as needed.  Hypokalemia Mild hypokalemia with potassium at 3.2 and hypomagnesemia with magnesium  at 1.7. -Replace potassium and magnesium  -Continue to monitor   TRH will continue to follow the patient.  Subjective: Patient was seen and examined today.  Still no flatus or any BM.  Significant dark-colored secretions with NG tube.  Physical Exam: Vitals:   05/10/24 0350 05/10/24 0744 05/10/24 1234 05/10/24 1546  BP: (!) 101/56 110/70 (!) 116/55 (!) 134/55  Pulse: 87 86 96 97  Resp:  14 13 16   Temp: (!) 96.8 F (36 C) 98.6 F (37 C) 98.6 F (37 C) 99 F (37.2 C)  TempSrc: Oral Oral    SpO2: 94% (!) 89% 90% 91%  Weight:      Height:       General.  Frail elderly lady, in no acute distress. Pulmonary.  Lungs clear bilaterally, normal respiratory effort. CV.  Regular rate and rhythm, no JVD, rub or murmur. Abdomen.  Soft, nontender, nondistended, BS hypoactive CNS.  Alert and oriented .  No focal neurologic deficit. Extremities.  No edema, pulses intact and  symmetrical. Psychiatry.  Judgment and insight appears normal.   Data Reviewed: Prior data reviewed  Family Communication: Primary team is communicating with family  Time spent: 50 minutes.  This record has been created using Conservation officer, historic buildings. Errors have been sought and corrected,but may not always be located. Such creation errors do not reflect on the standard of care.   Author: Amaryllis Dare, MD 05/10/2024 4:26 PM  For on call review [email or web address].

## 2024-05-10 NOTE — Patient Instructions (Incomplete)
 Heart Failure: Eating Plan Heart failure is a long-term condition where the heart can't pump enough blood through the body. When this happens, parts of the body don't get the blood and oxygen  they need. Living with heart failure can be hard. But a healthy lifestyle and choosing the right foods may help to improve your symptoms. If you have heart failure, your eating plan will include limiting the amount of salt, also called sodium, and unhealthy fats you eat. What are tips for following this plan? Reading food labels Check food labels for the amount of sodium per serving. Choose foods that have less than 140 mg (milligrams) of sodium in each serving. Check food labels for the number of calories per serving. This is important if you need to limit your daily calorie intake to lose weight. Check food labels for the serving size. If you eat more than one serving, you'll be eating more sodium and calories than what's listed on the label. Look for foods with the words sodium-free, very low sodium, or low sodium on the package. Foods labeled as reduced sodium, lightly salted, or no salt added may still have more sodium than what's recommended for you. Cooking Avoid adding salt when cooking. Before using any salt substitutes, talk with your health care provider or an expert in healthy eating called a dietitian. Season food with salt-free seasonings, spices, or herbs. Check the label of seasoning mixes to make sure they don't contain salt. Cook with heart-healthy oils, such as olive, canola, soybean, or sunflower oil. Do not fry foods. Cook foods using low-fat methods, like baking, boiling, grilling, and broiling. Limit unhealthy fats when cooking. To do this: Remove the skin from poultry, such as chicken. Remove all the fat you can see on meats. Skim the fat off from stews, soups, and gravies before serving them. Meal planning  Limit your intake of: Processed, canned, or prepackaged  foods. Foods that are high in trans fat, such as fried foods. Sweets, desserts, sugary drinks, and other foods with added sugar. Full-fat dairy products, such as whole milk. Eat a balanced diet. This may include: 4-5 servings of fruit each day and 4-5 servings of vegetables each day. At each meal, try to fill one-half of your plate with fruits and vegetables. Up to 6-8 servings of whole grains each day. Up to 2 servings of lean meat, poultry, or fish each day. One serving of meat is equal to 3 oz (85 g). This is about the same size as a deck of cards. 2 servings of low-fat dairy each day. Heart-healthy fats. Healthy fats called omega-3 fatty acids are a good choice. They're found in foods such as flaxseed and cold-water fish like sardines, salmon, and mackerel. Aim to eat 25-35 g (grams) of fiber a day. Foods that are high in fiber include apples, broccoli, carrots, beans, peas, and whole grains. Do not add salt or condiments that contain salt (such as soy sauce) to foods before eating. When eating at a restaurant, ask that your food be prepared with less salt or no salt, if possible. Try to eat 2 or more plant-based or meat-free meals each week. Cook more meals at home and eat less at restaurants, buffets, and fast food places. General information Do not eat more than 2,300 mg of sodium a day. The amount of sodium that's recommended for you may be lower, depending on your condition. Stay at a healthy weight as told. Ask your provider what a healthy weight is for you. Check  your weight every day. Work with your provider and dietitian to make a plan that will help you lose weight or stay at your current weight. Limit how much fluid you drink. Ask your provider or dietitian how much fluid you can have each day. Limit or avoid alcohol as told. Recommended foods Fruits All fresh, frozen, and canned fruits. Dried fruits, such as raisins, prunes, and cranberries. Vegetables All fresh vegetables.  Vegetables that are frozen without sauce or added salt. Low-sodium or sodium-free canned vegetables. Grains Bread with less than 80 mg of sodium per slice. Whole-wheat pasta, quinoa, and brown rice. Oats and oatmeal. Barley. Millet. Grits and cream of wheat. Whole-grain and whole-wheat cold cereal. Meats and other protein foods Lean cuts of meat. Skinless chicken and [place]. Fish with high omega-3 fatty acids, such as salmon, sardines, and other cold-water fishes. Eggs. Dried beans, peas, and edamame. Unsalted nuts and nut butters. Dairy Low-fat or nonfat (skim) milk and dried milk. Rice milk, soy milk, and almond milk. Low-fat or nonfat yogurt. Small amounts of reduced-sodium block cheese. Low-sodium cottage cheese. Fats and oils Olive, canola, soybean, flaxseed, avocado, or sunflower oil. Sweets and desserts Applesauce. Granola bars. Sugar-free pudding and gelatin. Frozen fruit bars. Seasoning and other foods Fresh and dried herbs. Lemon or lime juice. Vinegar. Low-sodium ketchup. Salt-free marinades, salad dressings, sauces, and seasonings. The items listed above may not be all the foods and drinks you can have. Talk with a dietitian to learn more. Foods to avoid Fruits Fruits that are dried with preservatives that contain sodium. Vegetables Canned vegetables. Frozen vegetables with sauce or seasonings. Creamed vegetables. [place] fries. Onion rings. Pickled vegetables and sauerkraut. Grains Bread with more than 80 mg of sodium per slice. Hot or cold cereal with more than 140 mg sodium per serving. Salted pretzels and crackers. Prepackaged breadcrumbs. Bagels, croissants, and biscuits. Meats and other protein foods Ribs and chicken wings. Bacon, ham, pepperoni, bologna, salami, and packaged luncheon meats. Hot dogs, bratwurst, and sausage. Canned meat. Smoked meat and fish. Salted nuts and seeds. Dairy Whole milk, half-and-half, and cream. Buttermilk. Processed cheese, cheese spreads, and  cheese curds. Regular cottage cheese. Feta cheese. Shredded cheese. String cheese. Fats and oils Butter, lard, shortening, ghee, and bacon fat. Canned and packaged gravies. Seasoning and other foods Onion salt, garlic salt, table salt, and sea salt. Marinades. Regular salad dressings. Relishes, pickles, and olives. Meat flavorings and tenderizers, and bouillon cubes. Horseradish, ketchup, and mustard. Worcestershire sauce. Teriyaki sauce, soy sauce (including reduced sodium). Hot sauce and Tabasco sauce. Steak sauce, fish sauce, oyster sauce, and cocktail sauce. Taco seasonings. Barbecue sauce. Tartar sauce. The items listed above may not be all the foods and drinks you should avoid. Talk with a dietitian to learn more. This information is not intended to replace advice given to you by your health care provider. Make sure you discuss any questions you have with your health care provider. Document Revised: 12/31/2022 Document Reviewed: 12/20/2022 Elsevier Patient Education  2024 [employer].

## 2024-05-10 NOTE — Plan of Care (Signed)
  Problem: Clinical Measurements: Goal: Ability to maintain clinical measurements within normal limits will improve Outcome: Progressing   Problem: Nutrition: Goal: Adequate nutrition will be maintained Outcome: Progressing   Problem: Pain Managment: Goal: General experience of comfort will improve and/or be controlled Outcome: Progressing   Problem: Skin Integrity: Goal: Risk for impaired skin integrity will decrease Outcome: Progressing

## 2024-05-10 NOTE — Assessment & Plan Note (Signed)
 Blood pressure currently within goal Holding antihypertensives.  Will order as needed.

## 2024-05-11 ENCOUNTER — Encounter: Payer: Self-pay | Admitting: Surgery

## 2024-05-11 ENCOUNTER — Inpatient Hospital Stay

## 2024-05-11 DIAGNOSIS — K4030 Unilateral inguinal hernia, with obstruction, without gangrene, not specified as recurrent: Secondary | ICD-10-CM | POA: Diagnosis not present

## 2024-05-11 DIAGNOSIS — Z8673 Personal history of transient ischemic attack (TIA), and cerebral infarction without residual deficits: Secondary | ICD-10-CM | POA: Diagnosis not present

## 2024-05-11 DIAGNOSIS — I2510 Atherosclerotic heart disease of native coronary artery without angina pectoris: Secondary | ICD-10-CM | POA: Diagnosis not present

## 2024-05-11 DIAGNOSIS — I5022 Chronic systolic (congestive) heart failure: Secondary | ICD-10-CM | POA: Diagnosis not present

## 2024-05-11 DIAGNOSIS — Z9861 Coronary angioplasty status: Secondary | ICD-10-CM | POA: Diagnosis not present

## 2024-05-11 DIAGNOSIS — I10 Essential (primary) hypertension: Secondary | ICD-10-CM | POA: Diagnosis not present

## 2024-05-11 DIAGNOSIS — E876 Hypokalemia: Secondary | ICD-10-CM | POA: Diagnosis not present

## 2024-05-11 LAB — CBC
HCT: 36.4 % (ref 36.0–46.0)
Hemoglobin: 11.6 g/dL — ABNORMAL LOW (ref 12.0–15.0)
MCH: 30.2 pg (ref 26.0–34.0)
MCHC: 31.9 g/dL (ref 30.0–36.0)
MCV: 94.8 fL (ref 80.0–100.0)
Platelets: 364 K/uL (ref 150–400)
RBC: 3.84 MIL/uL — ABNORMAL LOW (ref 3.87–5.11)
RDW: 13.7 % (ref 11.5–15.5)
WBC: 15.7 K/uL — ABNORMAL HIGH (ref 4.0–10.5)
nRBC: 0 % (ref 0.0–0.2)

## 2024-05-11 LAB — BASIC METABOLIC PANEL WITH GFR
Anion gap: 11 (ref 5–15)
BUN: 18 mg/dL (ref 8–23)
CO2: 27 mmol/L (ref 22–32)
Calcium: 8.5 mg/dL — ABNORMAL LOW (ref 8.9–10.3)
Chloride: 101 mmol/L (ref 98–111)
Creatinine, Ser: 0.46 mg/dL (ref 0.44–1.00)
GFR, Estimated: >60 mL/min
Glucose, Bld: 82 mg/dL (ref 70–99)
Potassium: 3.4 mmol/L — ABNORMAL LOW (ref 3.5–5.1)
Sodium: 138 mmol/L (ref 135–145)

## 2024-05-11 LAB — MAGNESIUM: Magnesium: 1.9 mg/dL (ref 1.7–2.4)

## 2024-05-11 MED ORDER — POTASSIUM CHLORIDE 10 MEQ/100ML IV SOLN
10.0000 meq | INTRAVENOUS | Status: DC
  Administered 2024-05-11: 10 meq via INTRAVENOUS
  Filled 2024-05-11 (×2): qty 100

## 2024-05-11 MED ORDER — POTASSIUM CHLORIDE 10 MEQ/100ML IV SOLN
10.0000 meq | INTRAVENOUS | Status: AC
  Administered 2024-05-11 (×3): 10 meq via INTRAVENOUS
  Filled 2024-05-11 (×2): qty 100

## 2024-05-11 MED ORDER — DIATRIZOATE MEGLUMINE & SODIUM 66-10 % PO SOLN
90.0000 mL | Freq: Once | ORAL | Status: AC
  Administered 2024-05-11: 90 mL via NASOGASTRIC

## 2024-05-11 MED ORDER — PHENOL 1.4 % MT LIQD
1.0000 | OROMUCOSAL | Status: AC | PRN
  Administered 2024-05-12 (×2): 1 via OROMUCOSAL
  Filled 2024-05-11: qty 177

## 2024-05-11 MED ORDER — KCL IN DEXTROSE-NACL 40-5-0.45 MEQ/L-%-% IV SOLN
INTRAVENOUS | Status: DC
  Filled 2024-05-11 (×5): qty 1000

## 2024-05-11 NOTE — Care Management Important Message (Signed)
 Important Message  Patient Details  Name: Tina Hardy MRN: [id number] Date of Birth: [birth year]   Important Message Given:  Yes - Medicare IM     Rayelle Armor W, CMA 05/11/2024, 2:36 PM

## 2024-05-11 NOTE — Assessment & Plan Note (Signed)
 Mild hypokalemia with potassium at 3.4 and hypomagnesemia with magnesium  at 1.9. -Replace potassium and magnesium  -Continue to monitor

## 2024-05-11 NOTE — TOC CM/SW Note (Signed)
 Transition of Care [facility]) - Inpatient Brief Assessment   Patient Details  Name: Tina Hardy MRN: [id number] Date of Birth: 09-01-1940  Transition of Care [facility]) CM/SW Contact:    Daved JONETTA Hamilton, RN Phone Number: 05/11/2024, 9:10 AM   Clinical Narrative:   Transition of Care [facility]) Screening Note   Patient Details  Name: Tina Hardy Date of Birth: Dec 30, 1940   Transition of Care [facility]) CM/SW Contact:    Daved JONETTA Hamilton, RN Phone Number: 05/11/2024, 9:10 AM    Transition of Care Department [facility]) has reviewed patient and no TOC needs have been identified at this time. If new patient transition needs arise, please place a TOC consult.    Transition of Care Asessment: Insurance and Status: Insurance coverage has been reviewed Patient has primary care physician: Yes   Prior level of function:: Independent Prior/Current Home Services: No current home services Social Drivers of Health Review: SDOH reviewed no interventions necessary Readmission risk has been reviewed: Yes Transition of care needs: no transition of care needs at this time

## 2024-05-11 NOTE — Progress Notes (Addendum)
 Consultation Progress Note   Patient: Tina Hardy FMW:[id number] DOB: 04/09/1941 DOA: 05/08/2024 DOS: the patient was seen and examined on 05/11/2024 Primary service: Tye Millet, DO  Brief hospital course: Partly taken from consult note.   Tina Hardy is a 83 y.o. female with past medical history of HTN, HLD, anxiety and depression TIA s/p carotid artery stent, CAD s/p stent, new diagnosis of cardiomyopathy/HFrEF (EF 35 to 40%, 03/18/24), after PCP discovered a new murmur on routine exam, seen by cardiology 04/29/2024 with plans for stress test being being seen in urgent consultation for medical clearance prior to emergent surgical repair of incarcerated and strangulated femoral hernia given concerns for concomitant CHF exacerbation. She presented to the ED with abdominal pain associated with a couple days of vomiting.  No chest pain, dyspnea, orthopnea or lower extremity edema.  On presentation patient was mildly tachycardic and tachypneic, workup consistent with infection, CT abdomen with concern of incarcerated hernia.  Chest x-ray was without any acute abnormality.  EKG with NSR, LVH and nonspecific intraventricular conduction block.  Patient was admitted under surgical department and TRH was consulted for emergent surgical clearance with history of HFrEF.  12/20: Vital stable, persistent leukocytosis with small improvement now at 23.7, mild hypokalemia 3.2.  Magnesium  at 1.7-replating both potassium and magnesium  troponin negative with mildly elevated BNP at 475. S/p robotic assisted laparoscopic right femoral hernia repair with mesh and small bowel resection. Still with NG tube and no bowel function.  12/21: Vital stable with improving leukocytosis, mild hypophosphatemia which is being repleted.  Dark bilious NG secretions-General Surgery started IV Protonix .  Home aspirin  and Plavix  are being restarted.  Still no bowel function.  12/22: Hemodynamically stable, slowly improving  leukocytosis, potassium at 3.4 which is being repleted.  Started passing flatus, no BM yet.  Improving secretions and NG tube.  Assessment and Plan: * Incarcerated inguinal hernia Management per surgery S/p robotic assisted right femoral hernia repair and small bowel resection. Starting passing flatus, no BM yet.  Improving secretions and NG tube  Chronic HFrEF (heart failure with reduced ejection fraction) (HCC) Cardiomyopathy pending ischemic evaluation with stress test(EF 35 to 40% 03/18/2024) Systolic murmur Patient denies chest pain or shortness of breath and she is clinically euvolemic With place at low risk for perioperative cardiopulmonary events Clinically appears euvolemic Monitor volume status Continue home medications Surgery restarted home aspirin  and Plavix  Daily weights  CAD S/P percutaneous coronary angioplasty History of internal carotid artery stent History of TIA No acute issues suspected General Surgery restarted home aspirin  and Plavix  Continue  rosuvastatin  once cleared for oral  Hypertension Blood pressure currently within goal Holding antihypertensives.  Will order as needed.  Hypokalemia Mild hypokalemia with potassium at 3.4 and hypomagnesemia with magnesium  at 1.9. -Replace potassium and magnesium  -Continue to monitor   Patient remained medically stable.  Case was discussed with general surgery and we will sign off at this point.  Feel free to call us  back if needed.  Subjective: Patient was seen and examined today.  Started passing flatus, no BM yet.  Wants to go home.  Physical Exam: Vitals:   05/10/24 1234 05/10/24 1546 05/10/24 1948 05/11/24 0405  BP: (!) 116/55 (!) 134/55 (!) 121/48 128/61  Pulse: 96 97 90 97  Resp: 13 16 18 18   Temp: 98.6 F (37 C) 99 F (37.2 C) 98.5 F (36.9 C) 98.5 F (36.9 C)  TempSrc:   Oral Oral  SpO2: 90% 91% (!) 89% 90%  Weight:  Height:       General.  Frail elderly lady, in no acute  distress. Pulmonary.  Lungs clear bilaterally, normal respiratory effort. CV.  Regular rate and rhythm, no JVD, rub or murmur. Abdomen.  Soft, nontender, nondistended, BS positive. CNS.  Alert and oriented .  No focal neurologic deficit. Extremities.  No edema, no cyanosis, pulses intact and symmetrical. Psychiatry.  Judgment and insight appears normal.    Data Reviewed: Prior data reviewed  Family Communication: Primary team is communicating with family  Time spent: 45 minutes.  This record has been created using Conservation officer, historic buildings. Errors have been sought and corrected,but may not always be located. Such creation errors do not reflect on the standard of care.   Author: Amaryllis Dare, MD 05/11/2024 4:04 PM  For on call review [email or web address].

## 2024-05-11 NOTE — Plan of Care (Signed)

## 2024-05-12 ENCOUNTER — Ambulatory Visit: Admitting: Nurse Practitioner

## 2024-05-12 LAB — CBC
HCT: 35 % — ABNORMAL LOW (ref 36.0–46.0)
Hemoglobin: 11.4 g/dL — ABNORMAL LOW (ref 12.0–15.0)
MCH: 30.4 pg (ref 26.0–34.0)
MCHC: 32.6 g/dL (ref 30.0–36.0)
MCV: 93.3 fL (ref 80.0–100.0)
Platelets: 358 K/uL (ref 150–400)
RBC: 3.75 MIL/uL — ABNORMAL LOW (ref 3.87–5.11)
RDW: 13.5 % (ref 11.5–15.5)
WBC: 13.5 K/uL — ABNORMAL HIGH (ref 4.0–10.5)
nRBC: 0 % (ref 0.0–0.2)

## 2024-05-12 LAB — BASIC METABOLIC PANEL WITH GFR
Anion gap: 9 (ref 5–15)
BUN: 11 mg/dL (ref 8–23)
CO2: 26 mmol/L (ref 22–32)
Calcium: 8.3 mg/dL — ABNORMAL LOW (ref 8.9–10.3)
Chloride: 101 mmol/L (ref 98–111)
Creatinine, Ser: 0.42 mg/dL — ABNORMAL LOW (ref 0.44–1.00)
GFR, Estimated: >60 mL/min
Glucose, Bld: 130 mg/dL — ABNORMAL HIGH (ref 70–99)
Potassium: 3.4 mmol/L — ABNORMAL LOW (ref 3.5–5.1)
Sodium: 136 mmol/L (ref 135–145)

## 2024-05-12 LAB — SURGICAL PATHOLOGY

## 2024-05-12 NOTE — Plan of Care (Signed)

## 2024-05-12 NOTE — Plan of Care (Signed)
  Problem: Clinical Measurements: Goal: Will remain free from infection Outcome: Progressing Goal: Diagnostic test results will improve Outcome: Progressing Goal: Cardiovascular complication will be avoided Outcome: Progressing   Problem: Activity: Goal: Risk for activity intolerance will decrease Outcome: Progressing   Problem: Nutrition: Goal: Adequate nutrition will be maintained Outcome: Progressing   Problem: Coping: Goal: Level of anxiety will decrease Outcome: Progressing   Problem: Elimination: Goal: Will not experience complications related to bowel motility Outcome: Progressing Goal: Will not experience complications related to urinary retention Outcome: Progressing   Problem: Pain Managment: Goal: General experience of comfort will improve and/or be controlled Outcome: Progressing   Problem: Safety: Goal: Ability to remain free from injury will improve Outcome: Progressing   Problem: Skin Integrity: Goal: Risk for impaired skin integrity will decrease Outcome: Progressing

## 2024-05-12 NOTE — Progress Notes (Signed)
 [facility]- General Surgery  SURGICAL PROGRESS NOTE  Hospital Day(s): 4.   Post op day(s): 4 Days Post-Op.   Interval History:  Patient is status post day 4 of robotic assisted laparoscopic right femoral hernia repair with mesh, small bowel resection.  Overall doing well.  Abdominal x-ray from yesterday showed contrast throughout the entire colon with some air-filled loops of small bowel. Reports having a large bowel movement last night and admits passing gas.  Abdominal pain is controlled.  NG tube output not recorded.   Vital signs in last 24 hours: [min-max] current  Temp:  [97.5 F (36.4 C)-97.9 F (36.6 C)] 97.9 F (36.6 C) (12/23 0824) Pulse Rate:  [81-101] 87 (12/23 0824) Resp:  [17-18] 18 (12/23 0824) BP: (143-151)/(57-67) 143/58 (12/23 0824) SpO2:  [90 %-92 %] 92 % (12/23 0824)     Height: 5' 5 (165.1 cm) Weight: 65 kg BMI (Calculated): 23.85   Intake/Output last 2 shifts:  12/22 0701 - 12/23 0700 In: 1006.6 [I.V.:1006.6] Out: -    Physical Exam:  Constitutional: alert, cooperative and no distress  Respiratory: breathing non-labored at rest  Cardiovascular: regular rate and sinus rhythm  Gastrointestinal: soft, generalized tenderness, and non-distended, honeycomb dressing in place, clean and dry  Labs:     Latest Ref Rng & Units 05/12/2024    5:45 AM 05/11/2024    5:00 AM 05/10/2024    5:25 AM  CBC  WBC 4.0 - 10.5 K/uL 13.5  15.7  18.3   Hemoglobin 12.0 - 15.0 g/dL 88.5  88.3  88.6   Hematocrit 36.0 - 46.0 % 35.0  36.4  35.9   Platelets 150 - 400 K/uL 358  364  347       Latest Ref Rng & Units 05/12/2024    5:45 AM 05/11/2024    5:00 AM 05/10/2024    5:25 AM  CMP  Glucose 70 - 99 mg/dL 869  82  89   BUN 8 - 23 mg/dL 11  18  21    Creatinine 0.44 - 1.00 mg/dL 9.57  9.53  9.45   Sodium 135 - 145 mmol/L 136  138  138   Potassium 3.5 - 5.1 mmol/L 3.4  3.4  3.6   Chloride 98 - 111 mmol/L 101  101  100   CO2 22 - 32 mmol/L 26  27  30    Calcium  8.9 -  10.3 mg/dL 8.3  8.5  8.6     Imaging studies:  CLINICAL DATA:  Small bowel obstruction, 8 hour delay.   EXAM: PORTABLE ABDOMEN - 1 VIEW   COMPARISON:  CT 1925   FINDINGS: Enteric tube tip and side-port below the diaphragm in the stomach. There is enteric contrast throughout the entire colon. Few prominent air-filled loops of small bowel in the central abdomen. Midline skin staples in place. Cholecystectomy clips in the right upper quadrant. Gas throughout the subcutaneous tissues likely related to recent robotic surgery.   IMPRESSION: 1. Enteric contrast throughout the entire colon. Few prominent air-filled loops of small bowel in the central abdomen. 2. Enteric tube tip and side-port below the diaphragm in the stomach.     Electronically Signed   By: Andrea Gasman M.D.   On: 05/11/2024 23:51     Assessment/Plan:  83 y.o. female with right strangulated femoral hernia 4 Days Post-Op s/p robotic assisted laparoscopic right femoral hernia repair with mesh, small bowel resection, complicated by pertinent comorbidities including hypertension.   - Stable vital signs, no fever not  tachycardic  - Appeared comfortable during encounter, minimal abdominal discomfort on exam.  - Leukocytosis improving 15.7 >> 13.5  - Because of return of GI function, we will plan for clamp trial today.  If output is less than 200 cc, will remove NG tube and start clear liquid diet.  - Encourage patient to ambulate  - Continue pain management  -- Devonne Lalani Barrientos PA-C

## 2024-05-13 ENCOUNTER — Other Ambulatory Visit: Payer: Self-pay

## 2024-05-13 LAB — CBC
HCT: 33.6 % — ABNORMAL LOW (ref 36.0–46.0)
Hemoglobin: 10.9 g/dL — ABNORMAL LOW (ref 12.0–15.0)
MCH: 30.3 pg (ref 26.0–34.0)
MCHC: 32.4 g/dL (ref 30.0–36.0)
MCV: 93.3 fL (ref 80.0–100.0)
Platelets: 398 K/uL (ref 150–400)
RBC: 3.6 MIL/uL — ABNORMAL LOW (ref 3.87–5.11)
RDW: 13.8 % (ref 11.5–15.5)
WBC: 11.8 K/uL — ABNORMAL HIGH (ref 4.0–10.5)
nRBC: 0 % (ref 0.0–0.2)

## 2024-05-13 LAB — BASIC METABOLIC PANEL WITH GFR
Anion gap: 6 (ref 5–15)
BUN: 9 mg/dL (ref 8–23)
CO2: 26 mmol/L (ref 22–32)
Calcium: 8.2 mg/dL — ABNORMAL LOW (ref 8.9–10.3)
Chloride: 106 mmol/L (ref 98–111)
Creatinine, Ser: 0.47 mg/dL (ref 0.44–1.00)
GFR, Estimated: >60 mL/min
Glucose, Bld: 134 mg/dL — ABNORMAL HIGH (ref 70–99)
Potassium: 4 mmol/L (ref 3.5–5.1)
Sodium: 138 mmol/L (ref 135–145)

## 2024-05-13 LAB — CULTURE, BLOOD (ROUTINE X 2): Culture: NO GROWTH

## 2024-05-13 MED ORDER — TRAMADOL HCL 50 MG PO TABS
50.0000 mg | ORAL_TABLET | Freq: Three times a day (TID) | ORAL | 0 refills | Status: AC | PRN
  Filled 2024-05-13: qty 10, 4d supply, fill #0

## 2024-05-13 NOTE — Discharge Instructions (Signed)
" °  Diet: Resume home heart healthy regular diet.   Activity: No heavy lifting >20 pounds (children, pets, laundry, garbage) or strenuous activity until follow-up, but light activity and walking are encouraged. Do not drive or drink alcohol if taking narcotic pain medications.  Wound care: May shower with soapy water and pat dry (do not rub incisions), but no baths or submerging incision underwater until follow-up. (no swimming) Plan to remove skin staples at follow-up visit in two weeks.   Medications: Resume all home medications. For mild to moderate pain: acetaminophen  (Tylenol ) or ibuprofen  (if no kidney disease). Combining Tylenol  with alcohol can substantially increase your risk of causing liver disease. Narcotic pain medications, if prescribed, can be used for severe pain, though may cause nausea, constipation, and drowsiness. Take Tramadol  as needed for severe pain.   Call office [phone]) at any time if any questions, worsening pain, fevers/chills, bleeding, drainage from incision site, or other concerns.  "

## 2024-05-13 NOTE — Plan of Care (Signed)

## 2024-05-13 NOTE — Discharge Summary (Addendum)
 [facility]-General Surgery  SURGICAL DISCHARGE SUMMARY  Patient ID: Tina Hardy MRN: [id number] DOB/AGE: [birth year] 81 y.o.  Admit date: 05/08/2024 Discharge date: 05/13/2024  Discharge Diagnoses Patient Active Problem List   Diagnosis Date Noted   Hypokalemia 05/09/2024   Incarcerated inguinal hernia 05/08/2024   Chronic HFrEF (heart failure with reduced ejection fraction) (HCC) 05/08/2024   CAD S/P percutaneous coronary angioplasty 05/08/2024   Heart failure with reduced ejection fraction (HCC) 04/01/2024   Heart murmur, systolic 03/18/2024   Lack of access to transportation 03/18/2024   Bee sting allergy 11/15/2021   Vitamin D  deficiency 06/09/2021   Vitamin B12 deficiency 06/09/2021   DDD (degenerative disc disease), lumbar 06/18/2020   Aortic atherosclerosis 06/18/2020   History of TIA (transient ischemic attack) 06/18/2020   Elevated hemoglobin A1c 05/26/2019   Situational anxiety 04/24/2019   Stenosis of left carotid artery 03/23/2019   Insomnia 03/23/2019   Marijuana use 03/23/2019   Hypertension 03/22/2019   Hyperlipidemia 03/22/2019   Hepatic hemangioma 07/16/2012   Diverticulosis of colon 06/22/2008    Consultants Hospitalist   Procedures Robotic assisted laparoscopic right femoral hernia repair with mesh, small bowel resection    Hospital Course:  Patient with medical history of heart failure, cardiomyopathy, and history of CAD with coronary stent placement on Plavix , presented [facility] the [facility] ED on 05[facility]08/2024 with abdominal pain associated with emesis and 1 episode of diarrhea. In the ED, patient was tachycardic with HR of 113, afebrile with temp of 97.5 F and BP of 131/76. On exam patient presented with focal tenderness over right inguinal hernia possible femoral hernia.  Labs indicated leukocytosis 26.7. Elevated creatinine level 1.10. Elevated lactic acid 2.4. Repeat lactic acid normal 1.8. CT of abdomen and pelvis showed small bowel obstruction  secondary to incarcerated right inguinal hernia containing distal ileum with mesenteric edema and small amount of free fluid. Patient was taken to the operating room later that night for robotic assisted laparoscopic right femoral hernia repair with mesh and small bowel resection. Patient was admitted for pain management and close observation post-op. Patient had NGT and was NPO until return of GI function.  On status post Day 3, patient was given Gastrografin  and was followed by abdominal x-ray, due to likely post-op ileus. Patient reported return of GI function status post day 4.  She was started on clear liquid diet and slowly advanced to regular diet. Patient is now s/p Day 5 tolerating regular diet post-op and is ambulating with minimal pain. Surgical incisions show no signs of infection. Pain is well-controlled with pain medication. Patient is clear from surgical standpoint.  Patient will follow-up outpatient with Dr. Tye in 2 weeks. Plan to remove skin staples then.    Physical Examination:  Constitutional: alert, in no acute distress Pulmonary: CTA bilaterally, normal breath sounds Cardiac: regular rate and rhythm Gastrointestinal: soft, mild tenderness, and non-distended Skin: Honeycomb dressing was removed, abdominal incisions look clean, dry, and intact.  Staples are intact   Allergies as of 05/13/2024       Reactions   Bee Venom Anaphylaxis   Codeine Other (See Comments)   Unknown reaction    Penicillins Other (See Comments)   Unknown reaction  Has patient had a PCN reaction causing immediate rash, facial/tongue/throat swelling, SOB or lightheadedness with hypotension: n/a Has patient had a PCN reaction causing severe rash involving mucus membranes or skin necrosis: n/a Has patient had a PCN reaction that required hospitalization: n/a Has patient had a PCN reaction occurring within the last  10 years: n/a If all of the above answers are NO, then may proceed with  Cephalosporin use.        Medication List     TAKE these medications    amLODipine  5 MG tablet Commonly known as: NORVASC  Take 5 mg by mouth every morning.   aspirin  EC 81 MG tablet Take 1 tablet (81 mg total) by mouth daily.   busPIRone  5 MG tablet Commonly known as: BUSPAR  Take 1 tablet (5 mg total) by mouth 2 (two) times daily.   citalopram  10 MG tablet Commonly known as: CELEXA  Take 1 tablet (10 mg total) by mouth daily.   clopidogrel  75 MG tablet Commonly known as: PLAVIX  TAKE 1 TABLET BY MOUTH ONCE DAILY AT 6:00AM   EPINEPHrine  0.3 mg/0.3 mL Soaj injection Commonly known as: EPI-PEN Inject 0.3 mg into the muscle as needed for anaphylaxis.   hydrocortisone  2.5 % cream Apply 1 Application topically 2 (two) times daily.   rosuvastatin  10 MG tablet Commonly known as: CRESTOR  Take 1 tablet (10 mg total) by mouth daily.   sacubitril -valsartan  49-51 MG Commonly known as: Entresto  Take 1 tablet by mouth 2 (two) times daily.   traMADol  50 MG tablet Commonly known as: Ultram  Take 1 tablet (50 mg total) by mouth every 8 (eight) hours as needed for up to 3 days.          Follow-up Information     Tye, Isami, DO Follow up in 2 week(s).   Specialties: General Surgery, Surgery Why: 2 weeks s/p robotic assisted laparoscopic right femoral hernia repair with mesh, small bowel resection (remove staples) Contact information: [address] [place] [address] [phone]                  Time spent on discharge management including discussion of hospital course, clinical condition, outpatient instructions, prescriptions, and follow up with the patient and members of the medical team: >30 minutes  Lilley Hubble Barrientos PA-C

## 2024-05-15 LAB — CULTURE, BLOOD (ROUTINE X 2)
Culture  Setup Time: NO GROWTH
Report Status: NO GROWTH
Special Requests: ADEQUATE

## 2024-06-13 NOTE — Patient Instructions (Incomplete)
 Heart Failure Action Plan A heart failure action plan helps you know what to do when you have symptoms of heart failure. Your action plan is a color-coded plan that lists the symptoms to watch for and indicates what actions to take. If you have symptoms in the green zone, you're doing well. If you have symptoms in the yellow zone, you're having problems. If you have symptoms in the red zone, you need medical care right away. Follow the plan that was created by you and your health care provider. Review your plan each time you visit your provider. Green zone These signs mean you're doing well and can continue what you're doing: You don't have new or worsening shortness of breath. You have very little swelling or no new swelling. Your weight is stable (no gain or loss). You have a normal activity level. You don't have chest pain or any other new symptoms. Yellow zone These signs and symptoms mean your condition may be getting worse and you should make some changes: You have trouble breathing when you're active. You have swelling in your feet or legs or have discomfort in your belly. You gain 2-3 lb (0.9-1.4 kg) in 24 hours, or 5 lb (2.3 kg) in a week. This amount may be more or less depending on your condition. You get tired easily. You have trouble sleeping. You have a dry cough. If you have any of these symptoms: Contact your provider within the next day. Your provider may adjust your medicines. Red zone These signs and symptoms mean you should get medical help right away: You have trouble breathing when resting or cannot lie flat and you need to raise your head to help you breathe. You have a dry cough that's getting worse. You have swelling or pain in your feet or legs or discomfort in your belly that's getting worse. You suddenly gain more than 2-3 lb (0.9-1.4 kg) in 24 hours, or more than 5 lb (2.3 kg) in a week. This amount may be more or less depending on your condition. You have  trouble staying awake or you feel confused. You don't have an appetite. You have worsening sadness or depression. These symptoms may be an emergency. Call 911 right away. Do not wait to see if the symptoms will go away. Do not drive yourself to the hospital. Follow these instructions at home: Take medicines only as told. Eat a heart-healthy diet. Work with a dietitian to create an eating plan that's best for you. Weigh yourself each day. Your target weight is __________ lb (__________ kg). Call your provider if you gain more than __________ lb (__________ kg) in 24 hours, or more than __________ lb (__________ kg) in a week. Health care provider name: _____________________________________________________ Health care provider phone number: _____________________________________________________ Where to find more information American Heart Association: heart.org This information is not intended to replace advice given to you by your health care provider. Make sure you discuss any questions you have with your health care provider. Document Revised: 12/20/2022 Document Reviewed: 12/20/2022 Elsevier Patient Education  2024 [employer].

## 2024-06-18 ENCOUNTER — Ambulatory Visit: Admitting: Nurse Practitioner

## 2024-06-18 DIAGNOSIS — F418 Other specified anxiety disorders: Secondary | ICD-10-CM

## 2024-06-18 DIAGNOSIS — I10 Essential (primary) hypertension: Secondary | ICD-10-CM

## 2024-06-18 DIAGNOSIS — I5020 Unspecified systolic (congestive) heart failure: Secondary | ICD-10-CM

## 2024-06-18 DIAGNOSIS — Z8673 Personal history of transient ischemic attack (TIA), and cerebral infarction without residual deficits: Secondary | ICD-10-CM

## 2024-06-18 DIAGNOSIS — R011 Cardiac murmur, unspecified: Secondary | ICD-10-CM

## 2024-06-18 DIAGNOSIS — E782 Mixed hyperlipidemia: Secondary | ICD-10-CM

## 2024-06-23 ENCOUNTER — Telehealth: Payer: Self-pay | Admitting: Nurse Practitioner

## 2024-06-23 NOTE — Telephone Encounter (Unsigned)
 Copied from CRM #[id number]. Topic: General - Other >> Jun 23, 2024  5:04 PM Hadassah PARAS wrote: Reason for CRM: Pt would like to advise Cannady [name] to read about [employer] about a Warning for 2026. It is regarding religious views.

## 2024-06-24 NOTE — Telephone Encounter (Signed)
 Scheduled and she said he does no have a car to go cardiology

## 2024-07-01 ENCOUNTER — Ambulatory Visit: Admitting: Nurse Practitioner

## 2024-12-31 ENCOUNTER — Ambulatory Visit
